# Patient Record
Sex: Male | Born: 1950 | Race: White | Hispanic: No | Marital: Married | Smoking: Current every day smoker
Health system: Southern US, Community
[De-identification: ages and names within clinical notes are randomized; demographics above are authoritative.]

## PROBLEM LIST (undated history)

## (undated) DIAGNOSIS — I236 Thrombosis of atrium, auricular appendage, and ventricle as current complications following acute myocardial infarction: Secondary | ICD-10-CM

## (undated) DIAGNOSIS — I219 Acute myocardial infarction, unspecified: Secondary | ICD-10-CM

## (undated) DIAGNOSIS — Z95810 Presence of automatic (implantable) cardiac defibrillator: Secondary | ICD-10-CM

## (undated) DIAGNOSIS — I255 Ischemic cardiomyopathy: Secondary | ICD-10-CM

## (undated) DIAGNOSIS — I2510 Atherosclerotic heart disease of native coronary artery without angina pectoris: Secondary | ICD-10-CM

## (undated) DIAGNOSIS — I4901 Ventricular fibrillation: Secondary | ICD-10-CM

## (undated) DIAGNOSIS — I10 Essential (primary) hypertension: Secondary | ICD-10-CM

## (undated) HISTORY — DX: Thrombosis of atrium, auricular appendage, and ventricle as current complications following acute myocardial infarction: I23.6

## (undated) HISTORY — DX: Ventricular fibrillation: I49.01

## (undated) HISTORY — DX: Ischemic cardiomyopathy: I25.5

## (undated) HISTORY — DX: Atherosclerotic heart disease of native coronary artery without angina pectoris: I25.10

---

## 1997-01-13 DIAGNOSIS — I219 Acute myocardial infarction, unspecified: Secondary | ICD-10-CM

## 1997-01-13 HISTORY — PX: CORONARY ANGIOPLASTY: SHX604

## 1997-01-13 HISTORY — DX: Acute myocardial infarction, unspecified: I21.9

## 1997-04-13 ENCOUNTER — Encounter (HOSPITAL_COMMUNITY): Admission: RE | Admit: 1997-04-13 | Discharge: 1997-07-12 | Payer: Self-pay | Admitting: Cardiology

## 2015-04-01 DIAGNOSIS — J019 Acute sinusitis, unspecified: Secondary | ICD-10-CM | POA: Diagnosis not present

## 2015-04-01 DIAGNOSIS — J209 Acute bronchitis, unspecified: Secondary | ICD-10-CM | POA: Diagnosis not present

## 2015-04-01 DIAGNOSIS — I10 Essential (primary) hypertension: Secondary | ICD-10-CM | POA: Diagnosis not present

## 2015-04-16 DIAGNOSIS — I10 Essential (primary) hypertension: Secondary | ICD-10-CM | POA: Diagnosis not present

## 2015-05-17 DIAGNOSIS — I10 Essential (primary) hypertension: Secondary | ICD-10-CM | POA: Diagnosis not present

## 2015-05-17 DIAGNOSIS — I25119 Atherosclerotic heart disease of native coronary artery with unspecified angina pectoris: Secondary | ICD-10-CM | POA: Diagnosis not present

## 2015-06-15 DIAGNOSIS — R198 Other specified symptoms and signs involving the digestive system and abdomen: Secondary | ICD-10-CM | POA: Diagnosis not present

## 2015-06-15 DIAGNOSIS — I10 Essential (primary) hypertension: Secondary | ICD-10-CM | POA: Diagnosis not present

## 2015-06-15 DIAGNOSIS — D539 Nutritional anemia, unspecified: Secondary | ICD-10-CM | POA: Diagnosis not present

## 2015-06-15 DIAGNOSIS — R748 Abnormal levels of other serum enzymes: Secondary | ICD-10-CM | POA: Diagnosis not present

## 2015-06-15 DIAGNOSIS — I25119 Atherosclerotic heart disease of native coronary artery with unspecified angina pectoris: Secondary | ICD-10-CM | POA: Diagnosis not present

## 2015-12-14 DIAGNOSIS — R748 Abnormal levels of other serum enzymes: Secondary | ICD-10-CM | POA: Diagnosis not present

## 2015-12-14 DIAGNOSIS — I10 Essential (primary) hypertension: Secondary | ICD-10-CM | POA: Diagnosis not present

## 2015-12-14 DIAGNOSIS — I25119 Atherosclerotic heart disease of native coronary artery with unspecified angina pectoris: Secondary | ICD-10-CM | POA: Diagnosis not present

## 2016-01-02 ENCOUNTER — Other Ambulatory Visit: Payer: Self-pay | Admitting: Gastroenterology

## 2016-01-02 DIAGNOSIS — R945 Abnormal results of liver function studies: Principal | ICD-10-CM

## 2016-01-02 DIAGNOSIS — R7989 Other specified abnormal findings of blood chemistry: Secondary | ICD-10-CM

## 2016-01-02 DIAGNOSIS — R740 Nonspecific elevation of levels of transaminase and lactic acid dehydrogenase [LDH]: Secondary | ICD-10-CM | POA: Diagnosis not present

## 2016-01-02 DIAGNOSIS — F1010 Alcohol abuse, uncomplicated: Secondary | ICD-10-CM | POA: Diagnosis not present

## 2016-01-02 DIAGNOSIS — R195 Other fecal abnormalities: Secondary | ICD-10-CM | POA: Diagnosis not present

## 2016-01-09 ENCOUNTER — Ambulatory Visit
Admission: RE | Admit: 2016-01-09 | Discharge: 2016-01-09 | Disposition: A | Discharge status: Home / Self Care | Payer: PPO | Source: Ambulatory Visit | Attending: Gastroenterology | Admitting: Gastroenterology

## 2016-01-09 DIAGNOSIS — R945 Abnormal results of liver function studies: Principal | ICD-10-CM

## 2016-01-09 DIAGNOSIS — R7989 Other specified abnormal findings of blood chemistry: Secondary | ICD-10-CM

## 2016-01-22 DIAGNOSIS — K222 Esophageal obstruction: Secondary | ICD-10-CM | POA: Diagnosis not present

## 2016-01-22 DIAGNOSIS — K921 Melena: Secondary | ICD-10-CM | POA: Diagnosis not present

## 2016-01-22 DIAGNOSIS — R195 Other fecal abnormalities: Secondary | ICD-10-CM | POA: Diagnosis not present

## 2016-01-22 DIAGNOSIS — K766 Portal hypertension: Secondary | ICD-10-CM | POA: Diagnosis not present

## 2016-01-22 DIAGNOSIS — K317 Polyp of stomach and duodenum: Secondary | ICD-10-CM | POA: Diagnosis not present

## 2016-01-22 DIAGNOSIS — K228 Other specified diseases of esophagus: Secondary | ICD-10-CM | POA: Diagnosis not present

## 2016-01-22 DIAGNOSIS — K5730 Diverticulosis of large intestine without perforation or abscess without bleeding: Secondary | ICD-10-CM | POA: Diagnosis not present

## 2016-02-12 DIAGNOSIS — G44219 Episodic tension-type headache, not intractable: Secondary | ICD-10-CM | POA: Diagnosis not present

## 2016-02-12 DIAGNOSIS — H40033 Anatomical narrow angle, bilateral: Secondary | ICD-10-CM | POA: Diagnosis not present

## 2016-04-21 DIAGNOSIS — F1010 Alcohol abuse, uncomplicated: Secondary | ICD-10-CM | POA: Diagnosis not present

## 2016-04-21 DIAGNOSIS — K5900 Constipation, unspecified: Secondary | ICD-10-CM | POA: Diagnosis not present

## 2016-06-10 DIAGNOSIS — I10 Essential (primary) hypertension: Secondary | ICD-10-CM | POA: Diagnosis not present

## 2016-06-10 DIAGNOSIS — I25119 Atherosclerotic heart disease of native coronary artery with unspecified angina pectoris: Secondary | ICD-10-CM | POA: Diagnosis not present

## 2016-06-12 DIAGNOSIS — Z23 Encounter for immunization: Secondary | ICD-10-CM | POA: Diagnosis not present

## 2016-06-12 DIAGNOSIS — I25119 Atherosclerotic heart disease of native coronary artery with unspecified angina pectoris: Secondary | ICD-10-CM | POA: Diagnosis not present

## 2016-06-12 DIAGNOSIS — I10 Essential (primary) hypertension: Secondary | ICD-10-CM | POA: Diagnosis not present

## 2016-06-12 DIAGNOSIS — R748 Abnormal levels of other serum enzymes: Secondary | ICD-10-CM | POA: Diagnosis not present

## 2016-07-13 DIAGNOSIS — I4901 Ventricular fibrillation: Secondary | ICD-10-CM

## 2016-07-13 HISTORY — DX: Ventricular fibrillation: I49.01

## 2016-08-04 ENCOUNTER — Inpatient Hospital Stay (HOSPITAL_COMMUNITY): Payer: PPO | Admitting: Anesthesiology

## 2016-08-04 ENCOUNTER — Encounter (HOSPITAL_COMMUNITY): Payer: Self-pay | Admitting: Certified Registered Nurse Anesthetist

## 2016-08-04 ENCOUNTER — Encounter (HOSPITAL_COMMUNITY)
Admission: EM | Disposition: A | Discharge status: Home Health | Payer: Self-pay | Source: Home / Self Care | Attending: Family Medicine

## 2016-08-04 ENCOUNTER — Inpatient Hospital Stay (HOSPITAL_COMMUNITY)
Admission: EM | Admit: 2016-08-04 | Discharge: 2016-08-19 | DRG: 981 | Disposition: A | Discharge status: Home Health | Payer: PPO | Attending: Family Medicine | Admitting: Family Medicine

## 2016-08-04 ENCOUNTER — Inpatient Hospital Stay (HOSPITAL_COMMUNITY): Payer: PPO

## 2016-08-04 ENCOUNTER — Emergency Department (HOSPITAL_COMMUNITY): Payer: PPO

## 2016-08-04 DIAGNOSIS — I469 Cardiac arrest, cause unspecified: Secondary | ICD-10-CM | POA: Insufficient documentation

## 2016-08-04 DIAGNOSIS — S2242XA Multiple fractures of ribs, left side, initial encounter for closed fracture: Secondary | ICD-10-CM | POA: Diagnosis present

## 2016-08-04 DIAGNOSIS — R55 Syncope and collapse: Secondary | ICD-10-CM | POA: Diagnosis not present

## 2016-08-04 DIAGNOSIS — S299XXD Unspecified injury of thorax, subsequent encounter: Secondary | ICD-10-CM | POA: Diagnosis not present

## 2016-08-04 DIAGNOSIS — Z23 Encounter for immunization: Secondary | ICD-10-CM | POA: Diagnosis not present

## 2016-08-04 DIAGNOSIS — K255 Chronic or unspecified gastric ulcer with perforation: Secondary | ICD-10-CM | POA: Diagnosis present

## 2016-08-04 DIAGNOSIS — K668 Other specified disorders of peritoneum: Secondary | ICD-10-CM | POA: Diagnosis not present

## 2016-08-04 DIAGNOSIS — I252 Old myocardial infarction: Secondary | ICD-10-CM

## 2016-08-04 DIAGNOSIS — I472 Ventricular tachycardia: Secondary | ICD-10-CM | POA: Diagnosis not present

## 2016-08-04 DIAGNOSIS — R000 Tachycardia, unspecified: Secondary | ICD-10-CM | POA: Diagnosis not present

## 2016-08-04 DIAGNOSIS — E871 Hypo-osmolality and hyponatremia: Secondary | ICD-10-CM | POA: Diagnosis not present

## 2016-08-04 DIAGNOSIS — T45515A Adverse effect of anticoagulants, initial encounter: Secondary | ICD-10-CM | POA: Diagnosis not present

## 2016-08-04 DIAGNOSIS — W19XXXA Unspecified fall, initial encounter: Secondary | ICD-10-CM | POA: Diagnosis present

## 2016-08-04 DIAGNOSIS — J9611 Chronic respiratory failure with hypoxia: Secondary | ICD-10-CM | POA: Diagnosis not present

## 2016-08-04 DIAGNOSIS — I255 Ischemic cardiomyopathy: Secondary | ICD-10-CM | POA: Diagnosis not present

## 2016-08-04 DIAGNOSIS — T829XXA Unspecified complication of cardiac and vascular prosthetic device, implant and graft, initial encounter: Secondary | ICD-10-CM

## 2016-08-04 DIAGNOSIS — T148XXA Other injury of unspecified body region, initial encounter: Secondary | ICD-10-CM | POA: Diagnosis not present

## 2016-08-04 DIAGNOSIS — R404 Transient alteration of awareness: Secondary | ICD-10-CM | POA: Diagnosis not present

## 2016-08-04 DIAGNOSIS — I493 Ventricular premature depolarization: Secondary | ICD-10-CM | POA: Diagnosis not present

## 2016-08-04 DIAGNOSIS — J90 Pleural effusion, not elsewhere classified: Secondary | ICD-10-CM

## 2016-08-04 DIAGNOSIS — I10 Essential (primary) hypertension: Secondary | ICD-10-CM

## 2016-08-04 DIAGNOSIS — E876 Hypokalemia: Secondary | ICD-10-CM | POA: Diagnosis not present

## 2016-08-04 DIAGNOSIS — J9690 Respiratory failure, unspecified, unspecified whether with hypoxia or hypercapnia: Secondary | ICD-10-CM | POA: Diagnosis not present

## 2016-08-04 DIAGNOSIS — S20219A Contusion of unspecified front wall of thorax, initial encounter: Secondary | ICD-10-CM

## 2016-08-04 DIAGNOSIS — I5022 Chronic systolic (congestive) heart failure: Secondary | ICD-10-CM | POA: Diagnosis not present

## 2016-08-04 DIAGNOSIS — R0989 Other specified symptoms and signs involving the circulatory and respiratory systems: Secondary | ICD-10-CM | POA: Diagnosis not present

## 2016-08-04 DIAGNOSIS — D62 Acute posthemorrhagic anemia: Secondary | ICD-10-CM | POA: Diagnosis present

## 2016-08-04 DIAGNOSIS — I639 Cerebral infarction, unspecified: Secondary | ICD-10-CM | POA: Diagnosis not present

## 2016-08-04 DIAGNOSIS — F17210 Nicotine dependence, cigarettes, uncomplicated: Secondary | ICD-10-CM | POA: Diagnosis present

## 2016-08-04 DIAGNOSIS — Z7141 Alcohol abuse counseling and surveillance of alcoholic: Secondary | ICD-10-CM

## 2016-08-04 DIAGNOSIS — S2232XA Fracture of one rib, left side, initial encounter for closed fracture: Secondary | ICD-10-CM | POA: Diagnosis not present

## 2016-08-04 DIAGNOSIS — K3189 Other diseases of stomach and duodenum: Secondary | ICD-10-CM | POA: Diagnosis not present

## 2016-08-04 DIAGNOSIS — I2510 Atherosclerotic heart disease of native coronary artery without angina pectoris: Secondary | ICD-10-CM | POA: Diagnosis present

## 2016-08-04 DIAGNOSIS — G9340 Encephalopathy, unspecified: Secondary | ICD-10-CM | POA: Diagnosis not present

## 2016-08-04 DIAGNOSIS — K922 Gastrointestinal hemorrhage, unspecified: Secondary | ICD-10-CM | POA: Diagnosis not present

## 2016-08-04 DIAGNOSIS — Y848 Other medical procedures as the cause of abnormal reaction of the patient, or of later complication, without mention of misadventure at the time of the procedure: Secondary | ICD-10-CM | POA: Diagnosis present

## 2016-08-04 DIAGNOSIS — J439 Emphysema, unspecified: Secondary | ICD-10-CM | POA: Diagnosis not present

## 2016-08-04 DIAGNOSIS — Z452 Encounter for adjustment and management of vascular access device: Secondary | ICD-10-CM

## 2016-08-04 DIAGNOSIS — R410 Disorientation, unspecified: Secondary | ICD-10-CM | POA: Diagnosis present

## 2016-08-04 DIAGNOSIS — S20219D Contusion of unspecified front wall of thorax, subsequent encounter: Secondary | ICD-10-CM | POA: Diagnosis not present

## 2016-08-04 DIAGNOSIS — Z716 Tobacco abuse counseling: Secondary | ICD-10-CM

## 2016-08-04 DIAGNOSIS — S20212A Contusion of left front wall of thorax, initial encounter: Secondary | ICD-10-CM | POA: Diagnosis not present

## 2016-08-04 DIAGNOSIS — M79622 Pain in left upper arm: Secondary | ICD-10-CM | POA: Diagnosis not present

## 2016-08-04 DIAGNOSIS — M47812 Spondylosis without myelopathy or radiculopathy, cervical region: Secondary | ICD-10-CM | POA: Diagnosis not present

## 2016-08-04 DIAGNOSIS — S20212S Contusion of left front wall of thorax, sequela: Secondary | ICD-10-CM

## 2016-08-04 DIAGNOSIS — K7460 Unspecified cirrhosis of liver: Secondary | ICD-10-CM | POA: Diagnosis present

## 2016-08-04 DIAGNOSIS — J9600 Acute respiratory failure, unspecified whether with hypoxia or hypercapnia: Secondary | ICD-10-CM | POA: Diagnosis not present

## 2016-08-04 DIAGNOSIS — Z9889 Other specified postprocedural states: Secondary | ICD-10-CM

## 2016-08-04 DIAGNOSIS — R402432 Glasgow coma scale score 3-8, at arrival to emergency department: Secondary | ICD-10-CM | POA: Diagnosis present

## 2016-08-04 DIAGNOSIS — Z9911 Dependence on respirator [ventilator] status: Secondary | ICD-10-CM | POA: Diagnosis not present

## 2016-08-04 DIAGNOSIS — R918 Other nonspecific abnormal finding of lung field: Secondary | ICD-10-CM | POA: Diagnosis not present

## 2016-08-04 DIAGNOSIS — I97621 Postprocedural hematoma of a circulatory system organ or structure following other procedure: Secondary | ICD-10-CM | POA: Diagnosis not present

## 2016-08-04 DIAGNOSIS — M7981 Nontraumatic hematoma of soft tissue: Secondary | ICD-10-CM | POA: Diagnosis not present

## 2016-08-04 DIAGNOSIS — I513 Intracardiac thrombosis, not elsewhere classified: Secondary | ICD-10-CM | POA: Diagnosis present

## 2016-08-04 DIAGNOSIS — I4901 Ventricular fibrillation: Secondary | ICD-10-CM | POA: Diagnosis not present

## 2016-08-04 DIAGNOSIS — S22069A Unspecified fracture of T7-T8 vertebra, initial encounter for closed fracture: Secondary | ICD-10-CM | POA: Diagnosis present

## 2016-08-04 DIAGNOSIS — J9601 Acute respiratory failure with hypoxia: Secondary | ICD-10-CM | POA: Diagnosis not present

## 2016-08-04 DIAGNOSIS — Z8673 Personal history of transient ischemic attack (TIA), and cerebral infarction without residual deficits: Secondary | ICD-10-CM

## 2016-08-04 DIAGNOSIS — N400 Enlarged prostate without lower urinary tract symptoms: Secondary | ICD-10-CM | POA: Diagnosis present

## 2016-08-04 DIAGNOSIS — I952 Hypotension due to drugs: Secondary | ICD-10-CM | POA: Diagnosis not present

## 2016-08-04 DIAGNOSIS — I468 Cardiac arrest due to other underlying condition: Secondary | ICD-10-CM | POA: Diagnosis not present

## 2016-08-04 DIAGNOSIS — I5023 Acute on chronic systolic (congestive) heart failure: Secondary | ICD-10-CM | POA: Diagnosis not present

## 2016-08-04 DIAGNOSIS — K251 Acute gastric ulcer with perforation: Secondary | ICD-10-CM | POA: Diagnosis not present

## 2016-08-04 DIAGNOSIS — R58 Hemorrhage, not elsewhere classified: Secondary | ICD-10-CM | POA: Diagnosis not present

## 2016-08-04 DIAGNOSIS — L7632 Postprocedural hematoma of skin and subcutaneous tissue following other procedure: Secondary | ICD-10-CM | POA: Diagnosis not present

## 2016-08-04 DIAGNOSIS — R0682 Tachypnea, not elsewhere classified: Secondary | ICD-10-CM | POA: Diagnosis not present

## 2016-08-04 DIAGNOSIS — D72829 Elevated white blood cell count, unspecified: Secondary | ICD-10-CM | POA: Diagnosis not present

## 2016-08-04 DIAGNOSIS — E785 Hyperlipidemia, unspecified: Secondary | ICD-10-CM | POA: Diagnosis present

## 2016-08-04 DIAGNOSIS — I110 Hypertensive heart disease with heart failure: Secondary | ICD-10-CM | POA: Diagnosis not present

## 2016-08-04 DIAGNOSIS — I517 Cardiomegaly: Secondary | ICD-10-CM | POA: Diagnosis not present

## 2016-08-04 DIAGNOSIS — M7989 Other specified soft tissue disorders: Secondary | ICD-10-CM | POA: Diagnosis not present

## 2016-08-04 DIAGNOSIS — S299XXA Unspecified injury of thorax, initial encounter: Secondary | ICD-10-CM | POA: Diagnosis not present

## 2016-08-04 DIAGNOSIS — R609 Edema, unspecified: Secondary | ICD-10-CM

## 2016-08-04 DIAGNOSIS — S20212D Contusion of left front wall of thorax, subsequent encounter: Secondary | ICD-10-CM | POA: Diagnosis not present

## 2016-08-04 HISTORY — DX: Atherosclerotic heart disease of native coronary artery without angina pectoris: I25.10

## 2016-08-04 HISTORY — DX: Essential (primary) hypertension: I10

## 2016-08-04 HISTORY — PX: LAPAROTOMY: SHX154

## 2016-08-04 LAB — TROPONIN I: TROPONIN I: 1.01 ng/mL — AB (ref <0.03)

## 2016-08-04 LAB — I-STAT CHEM 8, ED
BUN: 6 mg/dL (ref 6–20)
CALCIUM ION: 1.05 mmol/L — AB (ref 1.15–1.40)
Chloride: 101 mmol/L (ref 101–111)
Creatinine, Ser: 0.7 mg/dL (ref 0.61–1.24)
Glucose, Bld: 151 mg/dL — ABNORMAL HIGH (ref 65–99)
HCT: 50 % (ref 39.0–52.0)
Hemoglobin: 17 g/dL (ref 13.0–17.0)
Potassium: 3.3 mmol/L — ABNORMAL LOW (ref 3.5–5.1)
SODIUM: 142 mmol/L (ref 135–145)
TCO2: 23 mmol/L (ref 0–100)

## 2016-08-04 LAB — CBC WITH DIFFERENTIAL/PLATELET
BASOS PCT: 0 %
Basophils Absolute: 0 10*3/uL (ref 0.0–0.1)
EOS ABS: 0.2 10*3/uL (ref 0.0–0.7)
Eosinophils Relative: 1 %
HCT: 45.1 % (ref 39.0–52.0)
Hemoglobin: 15.7 g/dL (ref 13.0–17.0)
LYMPHS ABS: 6.2 10*3/uL — AB (ref 0.7–4.0)
Lymphocytes Relative: 35 %
MCH: 35 pg — AB (ref 26.0–34.0)
MCHC: 34.8 g/dL (ref 30.0–36.0)
MCV: 100.7 fL — AB (ref 78.0–100.0)
MONO ABS: 1.4 10*3/uL — AB (ref 0.1–1.0)
Monocytes Relative: 8 %
NEUTROS PCT: 56 %
Neutro Abs: 9.8 10*3/uL — ABNORMAL HIGH (ref 1.7–7.7)
PLATELETS: 378 10*3/uL (ref 150–400)
RBC: 4.48 MIL/uL (ref 4.22–5.81)
RDW: 13.7 % (ref 11.5–15.5)
WBC: 17.6 10*3/uL — ABNORMAL HIGH (ref 4.0–10.5)

## 2016-08-04 LAB — TRIGLYCERIDES: TRIGLYCERIDES: 88 mg/dL (ref <150)

## 2016-08-04 LAB — I-STAT CG4 LACTIC ACID, ED: Lactic Acid, Venous: 4.2 mmol/L (ref 0.5–1.9)

## 2016-08-04 LAB — CBG MONITORING, ED: GLUCOSE-CAPILLARY: 142 mg/dL — AB (ref 65–99)

## 2016-08-04 LAB — I-STAT ARTERIAL BLOOD GAS, ED
ACID-BASE EXCESS: 1 mmol/L (ref 0.0–2.0)
BICARBONATE: 27.2 mmol/L (ref 20.0–28.0)
O2 SAT: 100 %
PH ART: 7.37 (ref 7.350–7.450)
TCO2: 29 mmol/L (ref 0–100)
pCO2 arterial: 47.1 mmHg (ref 32.0–48.0)
pO2, Arterial: 512 mmHg — ABNORMAL HIGH (ref 83.0–108.0)

## 2016-08-04 LAB — MRSA PCR SCREENING: MRSA by PCR: NEGATIVE

## 2016-08-04 LAB — LACTIC ACID, PLASMA
LACTIC ACID, VENOUS: 1.6 mmol/L (ref 0.5–1.9)
Lactic Acid, Venous: 1.4 mmol/L (ref 0.5–1.9)

## 2016-08-04 LAB — ABO/RH: ABO/RH(D): A POS

## 2016-08-04 LAB — PROCALCITONIN: PROCALCITONIN: 0.25 ng/mL

## 2016-08-04 LAB — I-STAT TROPONIN, ED: Troponin i, poc: 0.07 ng/mL (ref 0.00–0.08)

## 2016-08-04 SURGERY — LAPAROTOMY, EXPLORATORY
Anesthesia: General | Site: Abdomen

## 2016-08-04 SURGERY — INVASIVE LAB ABORTED CASE

## 2016-08-04 MED ORDER — PIPERACILLIN-TAZOBACTAM 3.375 G IVPB 30 MIN
3.3750 g | INTRAVENOUS | Status: DC
  Filled 2016-08-04: qty 50

## 2016-08-04 MED ORDER — PANTOPRAZOLE SODIUM 40 MG IV SOLR
40.0000 mg | Freq: Every day | INTRAVENOUS | Status: DC
  Administered 2016-08-04: 40 mg via INTRAVENOUS
  Filled 2016-08-04: qty 40

## 2016-08-04 MED ORDER — PROPOFOL 10 MG/ML IV BOLUS
INTRAVENOUS | Status: AC
  Filled 2016-08-04: qty 20

## 2016-08-04 MED ORDER — FENTANYL CITRATE (PF) 100 MCG/2ML IJ SOLN
INTRAMUSCULAR | Status: DC | PRN
  Administered 2016-08-04 (×2): 100 ug via INTRAVENOUS
  Administered 2016-08-04: 50 ug via INTRAVENOUS
  Administered 2016-08-04: 250 ug via INTRAVENOUS

## 2016-08-04 MED ORDER — FOLIC ACID 5 MG/ML IJ SOLN
1.0000 mg | Freq: Every day | INTRAMUSCULAR | Status: DC
Start: 2016-08-04 — End: 2016-08-11
  Administered 2016-08-05 – 2016-08-09 (×4): 1 mg via INTRAVENOUS
  Filled 2016-08-04 (×11): qty 0.2

## 2016-08-04 MED ORDER — SODIUM CHLORIDE 0.9 % IV SOLN
200.0000 mg | Freq: Once | INTRAVENOUS | Status: AC
  Administered 2016-08-04: 200 mg via INTRAVENOUS
  Filled 2016-08-04: qty 200

## 2016-08-04 MED ORDER — SODIUM CHLORIDE 0.9 % IV SOLN
1.0000 g | Freq: Once | INTRAVENOUS | Status: AC
  Administered 2016-08-05: 1 g via INTRAVENOUS
  Filled 2016-08-04 (×2): qty 10

## 2016-08-04 MED ORDER — MIDAZOLAM HCL 5 MG/5ML IJ SOLN
INTRAMUSCULAR | Status: DC | PRN
  Administered 2016-08-04: 2 mg via INTRAVENOUS

## 2016-08-04 MED ORDER — SODIUM CHLORIDE 0.9 % IV SOLN: 250.0000 mL | INTRAVENOUS | Status: DC | PRN

## 2016-08-04 MED ORDER — ROCURONIUM BROMIDE 100 MG/10ML IV SOLN
INTRAVENOUS | Status: DC | PRN
  Administered 2016-08-04 (×2): 50 mg via INTRAVENOUS

## 2016-08-04 MED ORDER — PIPERACILLIN-TAZOBACTAM 3.375 G IVPB 30 MIN
3.3750 g | Freq: Once | INTRAVENOUS | Status: DC
  Filled 2016-08-04: qty 50

## 2016-08-04 MED ORDER — PROPOFOL 1000 MG/100ML IV EMUL
0.0000 ug/kg/min | INTRAVENOUS | Status: DC
  Administered 2016-08-04: 50 ug/kg/min via INTRAVENOUS
  Administered 2016-08-04: 40 ug/kg/min via INTRAVENOUS
  Administered 2016-08-05: 20 ug/kg/min via INTRAVENOUS
  Administered 2016-08-05 – 2016-08-06 (×2): 30 ug/kg/min via INTRAVENOUS
  Filled 2016-08-04 (×4): qty 100

## 2016-08-04 MED ORDER — FENTANYL CITRATE (PF) 100 MCG/2ML IJ SOLN
50.0000 ug | INTRAMUSCULAR | Status: DC | PRN
  Administered 2016-08-05 – 2016-08-07 (×5): 50 ug via INTRAVENOUS
  Filled 2016-08-04 (×2): qty 2

## 2016-08-04 MED ORDER — ORAL CARE MOUTH RINSE
15.0000 mL | OROMUCOSAL | Status: DC
  Administered 2016-08-04 – 2016-08-07 (×22): 15 mL via OROMUCOSAL

## 2016-08-04 MED ORDER — METOPROLOL TARTRATE 5 MG/5ML IV SOLN
5.0000 mg | Freq: Once | INTRAVENOUS | Status: AC
  Administered 2016-08-04: 5 mg via INTRAVENOUS

## 2016-08-04 MED ORDER — CHLORHEXIDINE GLUCONATE 0.12% ORAL RINSE (MEDLINE KIT)
15.0000 mL | Freq: Two times a day (BID) | OROMUCOSAL | Status: DC
  Administered 2016-08-04 – 2016-08-06 (×5): 15 mL via OROMUCOSAL

## 2016-08-04 MED ORDER — DEXTROSE 5 % IV SOLN
0.0000 ug/min | INTRAVENOUS | Status: DC
  Administered 2016-08-04: 5 ug/min via INTRAVENOUS
  Filled 2016-08-04: qty 4

## 2016-08-04 MED ORDER — ETOMIDATE 2 MG/ML IV SOLN
INTRAVENOUS | Status: DC | PRN
  Administered 2016-08-04: 20 mg via INTRAVENOUS

## 2016-08-04 MED ORDER — PROPOFOL 1000 MG/100ML IV EMUL
INTRAVENOUS | Status: AC
  Administered 2016-08-04: 5 ug/kg/min
  Filled 2016-08-04: qty 100

## 2016-08-04 MED ORDER — THIAMINE HCL 100 MG/ML IJ SOLN
100.0000 mg | Freq: Every day | INTRAMUSCULAR | Status: DC
  Administered 2016-08-05 – 2016-08-10 (×6): 100 mg via INTRAVENOUS
  Filled 2016-08-04 (×6): qty 2

## 2016-08-04 MED ORDER — 0.9 % SODIUM CHLORIDE (POUR BTL) OPTIME
TOPICAL | Status: DC | PRN
  Administered 2016-08-04 (×3): 1000 mL

## 2016-08-04 MED ORDER — HEPARIN SODIUM (PORCINE) 5000 UNIT/ML IJ SOLN
5000.0000 [IU] | Freq: Three times a day (TID) | INTRAMUSCULAR | Status: DC
  Administered 2016-08-04 – 2016-08-06 (×6): 5000 [IU] via SUBCUTANEOUS
  Filled 2016-08-04 (×6): qty 1

## 2016-08-04 MED ORDER — FENTANYL CITRATE (PF) 250 MCG/5ML IJ SOLN
INTRAMUSCULAR | Status: AC
  Filled 2016-08-04: qty 5

## 2016-08-04 MED ORDER — ROCURONIUM BROMIDE 50 MG/5ML IV SOLN
INTRAVENOUS | Status: DC | PRN
  Administered 2016-08-04: 80 mg via INTRAVENOUS

## 2016-08-04 MED ORDER — FENTANYL CITRATE (PF) 100 MCG/2ML IJ SOLN
50.0000 ug | INTRAMUSCULAR | Status: DC | PRN
  Filled 2016-08-04 (×4): qty 2

## 2016-08-04 MED ORDER — ALBUMIN HUMAN 5 % IV SOLN
INTRAVENOUS | Status: DC | PRN
  Administered 2016-08-04: 18:00:00 via INTRAVENOUS

## 2016-08-04 MED ORDER — LACTATED RINGERS IV SOLN
INTRAVENOUS | Status: DC | PRN
  Administered 2016-08-04: 17:00:00 via INTRAVENOUS

## 2016-08-04 MED ORDER — SODIUM CHLORIDE 0.9 % IV SOLN
100.0000 mg | INTRAVENOUS | Status: DC
  Administered 2016-08-05: 100 mg via INTRAVENOUS
  Filled 2016-08-04 (×2): qty 100

## 2016-08-04 MED ORDER — IOPAMIDOL (ISOVUE-300) INJECTION 61%
INTRAVENOUS | Status: AC
  Filled 2016-08-04: qty 30

## 2016-08-04 MED ORDER — PIPERACILLIN-TAZOBACTAM 3.375 G IVPB
3.3750 g | Freq: Three times a day (TID) | INTRAVENOUS | Status: DC
  Administered 2016-08-04 – 2016-08-06 (×6): 3.375 g via INTRAVENOUS
  Filled 2016-08-04 (×7): qty 50

## 2016-08-04 MED ORDER — MIDAZOLAM HCL 2 MG/2ML IJ SOLN
INTRAMUSCULAR | Status: AC
  Filled 2016-08-04: qty 2

## 2016-08-04 MED ORDER — POTASSIUM CHLORIDE 20 MEQ/15ML (10%) PO SOLN
40.0000 meq | Freq: Once | ORAL | Status: AC
  Administered 2016-08-04: 40 meq
  Filled 2016-08-04: qty 30

## 2016-08-04 MED ORDER — SODIUM CHLORIDE 0.9 % IV SOLN
INTRAVENOUS | Status: DC
  Administered 2016-08-04 – 2016-08-06 (×3): via INTRAVENOUS

## 2016-08-04 SURGICAL SUPPLY — 44 items
BLADE CLIPPER SURG (BLADE) IMPLANT
BNDG GAUZE ELAST 4 BULKY (GAUZE/BANDAGES/DRESSINGS) ×2 IMPLANT
CANISTER SUCT 3000ML PPV (MISCELLANEOUS) ×3 IMPLANT
CHLORAPREP W/TINT 26ML (MISCELLANEOUS) ×3 IMPLANT
COVER SURGICAL LIGHT HANDLE (MISCELLANEOUS) ×3 IMPLANT
DRAIN CHANNEL 19F RND (DRAIN) ×2 IMPLANT
DRAPE LAPAROSCOPIC ABDOMINAL (DRAPES) ×3 IMPLANT
DRAPE WARM FLUID 44X44 (DRAPE) ×3 IMPLANT
DRSG OPSITE POSTOP 4X10 (GAUZE/BANDAGES/DRESSINGS) IMPLANT
DRSG OPSITE POSTOP 4X8 (GAUZE/BANDAGES/DRESSINGS) IMPLANT
DRSG PAD ABDOMINAL 8X10 ST (GAUZE/BANDAGES/DRESSINGS) ×4 IMPLANT
ELECT BLADE 6.5 EXT (BLADE) IMPLANT
ELECT CAUTERY BLADE 6.4 (BLADE) ×3 IMPLANT
ELECT REM PT RETURN 9FT ADLT (ELECTROSURGICAL) ×3
ELECTRODE REM PT RTRN 9FT ADLT (ELECTROSURGICAL) ×1 IMPLANT
EVACUATOR SILICONE 100CC (DRAIN) ×2 IMPLANT
GAUZE SPONGE 4X4 12PLY STRL (GAUZE/BANDAGES/DRESSINGS) ×2 IMPLANT
GLOVE BIO SURGEON STRL SZ 6 (GLOVE) ×3 IMPLANT
GLOVE BIO SURGEON STRL SZ8 (GLOVE) ×2 IMPLANT
GLOVE BIOGEL PI IND STRL 6.5 (GLOVE) ×1 IMPLANT
GLOVE BIOGEL PI IND STRL 8 (GLOVE) IMPLANT
GLOVE BIOGEL PI INDICATOR 6.5 (GLOVE) ×2
GLOVE BIOGEL PI INDICATOR 8 (GLOVE) ×2
GOWN STRL REUS W/ TWL LRG LVL3 (GOWN DISPOSABLE) ×2 IMPLANT
GOWN STRL REUS W/TWL LRG LVL3 (GOWN DISPOSABLE) ×6
KIT BASIN OR (CUSTOM PROCEDURE TRAY) ×3 IMPLANT
KIT ROOM TURNOVER OR (KITS) ×3 IMPLANT
LIGASURE IMPACT 36 18CM CVD LR (INSTRUMENTS) IMPLANT
NS IRRIG 1000ML POUR BTL (IV SOLUTION) ×6 IMPLANT
PACK GENERAL/GYN (CUSTOM PROCEDURE TRAY) ×3 IMPLANT
PAD ARMBOARD 7.5X6 YLW CONV (MISCELLANEOUS) ×3 IMPLANT
SPECIMEN JAR LARGE (MISCELLANEOUS) IMPLANT
SPONGE LAP 18X18 X RAY DECT (DISPOSABLE) IMPLANT
STAPLER VISISTAT 35W (STAPLE) ×3 IMPLANT
SUCTION POOLE TIP (SUCTIONS) ×3 IMPLANT
SUT ETHILON 2 0 FS 18 (SUTURE) ×2 IMPLANT
SUT PDS AB 1 TP1 96 (SUTURE) ×6 IMPLANT
SUT SILK 2 0 SH CR/8 (SUTURE) ×5 IMPLANT
SUT SILK 2 0 TIES 10X30 (SUTURE) ×3 IMPLANT
SUT SILK 3 0 SH CR/8 (SUTURE) ×3 IMPLANT
SUT SILK 3 0 TIES 10X30 (SUTURE) ×3 IMPLANT
SUT VIC AB 3-0 SH 18 (SUTURE) IMPLANT
TOWEL OR 17X26 10 PK STRL BLUE (TOWEL DISPOSABLE) ×3 IMPLANT
TRAY FOLEY W/METER SILVER 16FR (SET/KITS/TRAYS/PACK) IMPLANT

## 2016-08-04 SURGICAL SUPPLY — 5 items
KIT HEART LEFT (KITS) ×1 IMPLANT
PACK CARDIAC CATHETERIZATION (CUSTOM PROCEDURE TRAY) ×1 IMPLANT
SYR MEDRAD MARK V 150ML (SYRINGE) ×1 IMPLANT
TRANSDUCER W/STOPCOCK (MISCELLANEOUS) ×1 IMPLANT
TUBING CIL FLEX 10 FLL-RA (TUBING) ×1 IMPLANT

## 2016-08-04 NOTE — ED Provider Notes (Signed)
[facility] DEPT Provider Note   CSN: [id number] Arrival date & time: 08/04/16  1542     History   Chief Complaint Chief Complaint  Patient presents with  . Loss of Consciousness    HPI [name] [name] Geisinger is a 66 y.o. male.  The history is provided by the EMS personnel.  Illness  This is a new problem. The current episode started 1 to 2 hours ago. The problem occurs constantly. The problem has not changed since onset.Pertinent negatives include no chest pain, no abdominal pain, no headaches and no shortness of breath. Associated symptoms comments: Loss of consciousness, abdominal distention. Nothing aggravates the symptoms. Nothing relieves the symptoms. He has tried nothing for the symptoms.    History reviewed. No pertinent past medical history.  Patient Active Problem List   Diagnosis Date Noted  . Pneumoperitoneum 08/04/2016  . Cardiac arrest ([place])   . Syncope and collapse     History reviewed. No pertinent surgical history.     Home Medications    Prior to Admission medications   Not on File    Family History History reviewed. No pertinent family history.  Social History Social History  Substance Use Topics  . Smoking status: Not on file  . Smokeless tobacco: Not on file  . Alcohol use Not on file     Allergies   Patient has no allergy information on record.   Review of Systems Review of Systems  Unable to perform ROS: Intubated  Respiratory: Negative for shortness of breath.   Cardiovascular: Negative for chest pain.  Gastrointestinal: Negative for abdominal pain.  Neurological: Negative for headaches.     Physical Exam Updated Vital Signs BP 91/68   Pulse 88   Temp 98.3 F (36.8 C) (Oral)   Resp 18   Ht 5' 8" (1.727 m)   Wt 75 kg (165 lb 5.5 oz)   SpO2 99%   BMI 25.14 kg/m   Physical Exam  Constitutional: He appears well-developed and well-nourished.  HENT:  Head: Normocephalic.  Eyes: Pupils are equal, round, and  reactive to light. Conjunctivae are normal.  Neck: No tracheal deviation present.  C-collar in place  Cardiovascular: Normal rate and intact distal pulses.   Pulmonary/Chest:  Ventilated breath sounds, equal bilaterally  Abdominal: Soft. He exhibits distension.  Marked distention of abdomen  Musculoskeletal: He exhibits no deformity.  Neurological:  GCS 3T  Skin: Skin is warm and dry.  Psychiatric:  Unable to assess     ED Treatments / Results  Labs (all labs ordered are listed, but only abnormal results are displayed) Labs Reviewed  CBC WITH DIFFERENTIAL/PLATELET - Abnormal; Notable for the following:       Result Value   WBC 17.6 (*)    MCV 100.7 (*)    MCH 35.0 (*)    Neutro Abs 9.8 (*)    Lymphs Abs 6.2 (*)    Monocytes Absolute 1.4 (*)    All other components within normal limits  TROPONIN I - Abnormal; Notable for the following:    Troponin I 1.01 (*)    All other components within normal limits  CBG MONITORING, ED - Abnormal; Notable for the following:    Glucose-Capillary 142 (*)    All other components within normal limits  I-STAT CHEM 8, ED - Abnormal; Notable for the following:    Potassium 3.3 (*)    Glucose, Bld 151 (*)    Calcium, Ion 1.05 (*)    All other components within normal  limits  I-STAT ARTERIAL BLOOD GAS, ED - Abnormal; Notable for the following:    pO2, Arterial 512.0 (*)    All other components within normal limits  I-STAT CG4 LACTIC ACID, ED - Abnormal; Notable for the following:    Lactic Acid, Venous 4.20 (*)    All other components within normal limits  MRSA PCR SCREENING  CULTURE, BLOOD (ROUTINE X 2)  CULTURE, BLOOD (ROUTINE X 2)  TRIGLYCERIDES  LACTIC ACID, PLASMA  LACTIC ACID, PLASMA  PROCALCITONIN  TROPONIN I  CBC  BASIC METABOLIC PANEL  BLOOD GAS, ARTERIAL  MAGNESIUM  PHOSPHORUS  PROCALCITONIN  BLOOD GAS, ARTERIAL  BASIC METABOLIC PANEL  MAGNESIUM  I-STAT TROPONIN, ED  TYPE AND SCREEN  ABO/RH  SURGICAL PATHOLOGY     EKG  EKG Interpretation None       Radiology Ct Abdomen Pelvis Wo Contrast  Addendum Date: 08/04/2016   ADDENDUM REPORT: 08/04/2016 17:57 ADDENDUM: Imaging findings and results were discussed with Dr. Georganna Skeans upon completion of the study by Dr. Laurence Ferrari. Electronically Signed   By: Donavan Foil M.D.   On: 08/04/2016 17:57   Result Date: 08/04/2016 CLINICAL DATA:  Collapsed in [employer], post CPR EXAM: CT CHEST, ABDOMEN AND PELVIS WITHOUT CONTRAST TECHNIQUE: Multidetector CT imaging of the chest, abdomen and pelvis was performed following the standard protocol without IV contrast. COMPARISON:  Radiograph 08/04/2016 FINDINGS: CT CHEST FINDINGS Cardiovascular: Limited evaluation without intravenous contrast. Aortic atherosclerosis. Coronary artery calcification. 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 Small amount of pericardial effusion. Mild cardiomegaly. Mediastinum/Nodes: Endotracheal tube tip terminates several cm above the carina. No thyroid mass. Esophageal tube in the esophagus with the tip terminating near the gastric outlet. Nonspecific subcentimeter mediastinal lymph nodes. Lungs/Pleura: No pneumothorax. Mild emphysema. Mild bronchiectasis within the right greater than left upper lobes. No focal consolidation or pleural effusion. Musculoskeletal: Motion artifact causes false appearance of inferior sternal fracture. Irregular linear lucency through the anterior aspect of T8 vertebral body, without posterior extension, this may extend to the inferior endplate at T8. Degenerative changes. CT ABDOMEN PELVIS FINDINGS Hepatobiliary: No focal liver abnormality is seen. No gallstones, gallbladder wall thickening, or biliary dilatation. Pancreas: Unremarkable. No pancreatic ductal dilatation or surrounding inflammatory changes. Spleen: Normal in size without focal abnormality. Adrenals/Urinary Tract: Adrenal glands are unremarkable. Kidneys are  normal, without renal calculi, focal lesion, or hydronephrosis. Bladder is unremarkable. Stomach/Bowel: Esophageal tube tip in the stomach. Contrast material present within the stomach and proximal small bowel. Small focal defect within the anterior aspect of the stomach/lesser curvature, series 3, image number 56 with leakage of contrast around this defect. No significant bowel wall thickening. Vascular/Lymphatic: Aortic atherosclerosis. Non aneurysmal aorta. Scattered subcentimeter mesenteric lymph nodes. Reproductive: Prominent prostate gland. Other: Massive pneumoperitoneum. Free intraperitoneal contrast within the left abdomen. Musculoskeletal: Degenerative changes. No acute or suspicious bone lesion. IMPRESSION: 1. Massive pneumoperitoneum with free extravasation of contrast in the left hemiabdomen, consistent with viscus perforation. Apparent source is a defect within the anterior aspect/lesser curvature of the stomach. 2. Linear lucency within the T8 vertebral body is suspicious for nondisplaced fracture, likely acute. 3. Mild emphysema Critical Value/emergent results were discussed with the surgeon caring for the patient by Dr. Laurence Ferrari at the time the scan was completed. A call was placed to the emergency department at 5:46 p.m., patient is reportedly in the OR. Electronically Signed: By: Donavan Foil M.D. On: 08/04/2016 17:47   Ct Head Wo Contrast  Result Date: 08/04/2016 CLINICAL DATA:  66 year old male status post cardiac arrest and collapse at [employer] earlier today. Patient is status post CPR. EXAM: CT HEAD WITHOUT CONTRAST CT CERVICAL SPINE WITHOUT CONTRAST TECHNIQUE: Multidetector CT imaging of the head and cervical spine was performed following the standard protocol without intravenous contrast. Multiplanar CT image reconstructions of the cervical spine were also generated. COMPARISON:  None. FINDINGS: CT HEAD FINDINGS Brain: Negative for acute intracranial hemorrhage, acute infarction,  mass, mass effect, hydrocephalus or midline shift. Gray-white differentiation is preserved throughout. Mild cerebral cortical atrophy. Small bilateral remote cerebellar lacunar infarcts. Vascular: No hyperdense vessel or unexpected calcification. Skull: Normal. Negative for fracture or focal lesion. Sinuses/Orbits: No acute finding. Other: None. CT CERVICAL SPINE FINDINGS Alignment: Normal. Skull base and vertebrae: No acute cervical spine fracture. There is a nondisplaced fracture through the posterior aspect of the left first rib. No primary bone lesion or focal pathologic process. Soft tissues and spinal canal: No prevertebral fluid or swelling. No visible canal hematoma. Disc levels: Multilevel cervical spondylosis. Degenerative changes are most notable at C4-C5, C5-C6 and C6-C7. Right-sided facet arthropathy at C3-C4. Left-sided facet arthropathy at C2-C3, C3-C4, C4-C5 and C7-T1. Upper chest: Negative. Other: The patient is intubated. A gastric tube is also present in the esophagus. IMPRESSION: CT HEAD 1. No acute intracranial abnormality. 2. Cerebral cortical atrophy. 3. Remote right cerebellar lacunar infarct. CT CSPINE 1. No evidence of acute cervical fracture or malalignment. 2. Nondisplaced left first rib fracture. 3. Multilevel cervical spondylosis and left greater than right facet arthropathy. Electronically Signed   By: Jacqulynn Cadet M.D.   On: 08/04/2016 17:33   Ct Chest Wo Contrast  Addendum Date: 08/04/2016   ADDENDUM REPORT: 08/04/2016 17:57 ADDENDUM: Imaging findings and results were discussed with Dr. Georganna Skeans upon completion of the study by Dr. Laurence Ferrari. Electronically Signed   By: Donavan Foil M.D.   On: 08/04/2016 17:57   Result Date: 08/04/2016 CLINICAL DATA:  Collapsed in [employer], post CPR EXAM: CT CHEST, ABDOMEN AND PELVIS WITHOUT CONTRAST TECHNIQUE: Multidetector CT imaging of the chest, abdomen and pelvis was performed following the standard protocol without IV  contrast. COMPARISON:  Radiograph 08/04/2016 FINDINGS: CT CHEST FINDINGS Cardiovascular: Limited evaluation without intravenous contrast. Aortic atherosclerosis. Coronary artery calcification. 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 Small amount of pericardial effusion. Mild cardiomegaly. Mediastinum/Nodes: Endotracheal tube tip terminates several cm above the carina. No thyroid mass. Esophageal tube in the esophagus with the tip terminating near the gastric outlet. Nonspecific subcentimeter mediastinal lymph nodes. Lungs/Pleura: No pneumothorax. Mild emphysema. Mild bronchiectasis within the right greater than left upper lobes. No focal consolidation or pleural effusion. Musculoskeletal: Motion artifact causes false appearance of inferior sternal fracture. Irregular linear lucency through the anterior aspect of T8 vertebral body, without posterior extension, this may extend to the inferior endplate at T8. Degenerative changes. CT ABDOMEN PELVIS FINDINGS Hepatobiliary: No focal liver abnormality is seen. No gallstones, gallbladder wall thickening, or biliary dilatation. Pancreas: Unremarkable. No pancreatic ductal dilatation or surrounding inflammatory changes. Spleen: Normal in size without focal abnormality. Adrenals/Urinary Tract: Adrenal glands are unremarkable. Kidneys are normal, without renal calculi, focal lesion, or hydronephrosis. Bladder is unremarkable. Stomach/Bowel: Esophageal tube tip in the stomach. Contrast material present within the stomach and proximal small bowel. Small focal defect within the anterior aspect of the stomach/lesser curvature, series 3, image number 56 with leakage of contrast around this defect. No significant bowel wall thickening. Vascular/Lymphatic: Aortic atherosclerosis. Non aneurysmal aorta. Scattered subcentimeter mesenteric lymph nodes. Reproductive: Prominent  prostate gland. Other: Massive pneumoperitoneum. Free intraperitoneal  contrast within the left abdomen. Musculoskeletal: Degenerative changes. No acute or suspicious bone lesion. IMPRESSION: 1. Massive pneumoperitoneum with free extravasation of contrast in the left hemiabdomen, consistent with viscus perforation. Apparent source is a defect within the anterior aspect/lesser curvature of the stomach. 2. Linear lucency within the T8 vertebral body is suspicious for nondisplaced fracture, likely acute. 3. Mild emphysema Critical Value/emergent results were discussed with the surgeon caring for the patient by Dr. Laurence Ferrari at the time the scan was completed. A call was placed to the emergency department at 5:46 p.m., patient is reportedly in the OR. Electronically Signed: By: Donavan Foil M.D. On: 08/04/2016 17:47   Ct Cervical Spine Wo Contrast  Result Date: 08/04/2016 CLINICAL DATA:  66 year old male status post cardiac arrest and collapse at [employer] earlier today. Patient is status post CPR. EXAM: CT HEAD WITHOUT CONTRAST CT CERVICAL SPINE WITHOUT CONTRAST TECHNIQUE: Multidetector CT imaging of the head and cervical spine was performed following the standard protocol without intravenous contrast. Multiplanar CT image reconstructions of the cervical spine were also generated. COMPARISON:  None. FINDINGS: CT HEAD FINDINGS Brain: Negative for acute intracranial hemorrhage, acute infarction, mass, mass effect, hydrocephalus or midline shift. Gray-white differentiation is preserved throughout. Mild cerebral cortical atrophy. Small bilateral remote cerebellar lacunar infarcts. Vascular: No hyperdense vessel or unexpected calcification. Skull: Normal. Negative for fracture or focal lesion. Sinuses/Orbits: No acute finding. Other: None. CT CERVICAL SPINE FINDINGS Alignment: Normal. Skull base and vertebrae: No acute cervical spine fracture. There is a nondisplaced fracture through the posterior aspect of the left first rib. No primary bone lesion or focal pathologic process. Soft  tissues and spinal canal: No prevertebral fluid or swelling. No visible canal hematoma. Disc levels: Multilevel cervical spondylosis. Degenerative changes are most notable at C4-C5, C5-C6 and C6-C7. Right-sided facet arthropathy at C3-C4. Left-sided facet arthropathy at C2-C3, C3-C4, C4-C5 and C7-T1. Upper chest: Negative. Other: The patient is intubated. A gastric tube is also present in the esophagus. IMPRESSION: CT HEAD 1. No acute intracranial abnormality. 2. Cerebral cortical atrophy. 3. Remote right cerebellar lacunar infarct. CT CSPINE 1. No evidence of acute cervical fracture or malalignment. 2. Nondisplaced left first rib fracture. 3. Multilevel cervical spondylosis and left greater than right facet arthropathy. Electronically Signed   By: Jacqulynn Cadet M.D.   On: 08/04/2016 17:33   Dg Chest Portable 1 View  Result Date: 08/04/2016 CLINICAL DATA:  Cardiorespiratory arrest. Cardiopulmonary resuscitation. EXAM: PORTABLE CHEST 1 VIEW COMPARISON:  None. FINDINGS: Massive pneumoperitoneum. Endotracheal tube tip in satisfactory position approximately 5 cm above the carina. Nasogastric tube courses below the diaphragm into the stomach. Cardiac silhouette moderately enlarged. Lungs clear. Bronchovascular markings normal. Pulmonary vascularity normal. No visible pleural effusions. No pneumothorax. IMPRESSION: 1. Massive pneumoperitoneum. 2.  Support apparatus satisfactory. 3. Moderate cardiomegaly.  No acute cardiopulmonary disease. I personally telephoned these emergent results at the time of interpretation on 08/04/2016 at 4:15 pm to Dr. Orlie Dakin, who verbally acknowledged these results. Electronically Signed   By: Evangeline Dakin M.D.   On: 08/04/2016 16:18    Procedures Procedure Name: Intubation Date/Time: 08/05/2016 12:24 AM Performed by: Valda Lamb Pre-anesthesia Checklist: Patient identified, Emergency Drugs available, Suction available, Patient being monitored and Timeout  performed Oxygen Delivery Method: Non-rebreather mask Preoxygenation: Pre-oxygenation with 100% oxygen Induction Type: Rapid sequence and Cricoid Pressure applied Ventilation: Mask ventilation without difficulty Laryngoscope Size: Glidescope and 4 Grade View: Grade I Tube size: 7.5 mm Number of attempts:  1 Airway Equipment and Method: Video-laryngoscopy and Rigid stylet Placement Confirmation: ETT inserted through vocal cords under direct vision,  Positive ETCO2 and Breath sounds checked- equal and bilateral Secured at: 24 cm Tube secured with: ETT holder      (including critical care time)  Medications Ordered in ED Medications  etomidate (AMIDATE) injection (20 mg Intravenous Given 08/04/16 1549)  heparin injection 5,000 Units (5,000 Units Subcutaneous Given 08/04/16 2143)  0.9 %  sodium chloride infusion ( Intravenous Rate/Dose Verify 08/04/16 2300)  pantoprazole (PROTONIX) injection 40 mg (40 mg Intravenous Given 08/04/16 2136)  0.9 %  sodium chloride infusion ( Intravenous MAR Unhold 08/04/16 1848)  fentaNYL (SUBLIMAZE) injection 50 mcg ( Intravenous MAR Unhold 08/04/16 1848)  fentaNYL (SUBLIMAZE) injection 50 mcg ( Intravenous MAR Unhold 08/04/16 1848)  propofol (DIPRIVAN) 1000 MG/100ML infusion (0 mcg/kg/min  75 kg Intravenous Stopped 08/04/16 2238)  calcium gluconate 1 g in sodium chloride 0.9 % 100 mL IVPB ( Intravenous MAR Unhold 08/04/16 1848)  piperacillin-tazobactam (ZOSYN) IVPB 3.375 g ( Intravenous MAR Unhold 08/04/16 1848)  anidulafungin (ERAXIS) 200 mg in sodium chloride 0.9 % 200 mL IVPB (200 mg Intravenous New Bag/Given 08/04/16 2232)  anidulafungin (ERAXIS) 100 mg in sodium chloride 0.9 % 100 mL IVPB ( Intravenous MAR Unhold 08/04/16 1848)  iopamidol (ISOVUE-300) 61 % injection (not administered)  thiamine (B-1) injection 100 mg ( Intravenous MAR Unhold 5/39/76 7341)  folic acid injection 1 mg ( Intravenous MAR Unhold 08/04/16 1848)  piperacillin-tazobactam (ZOSYN) IVPB  3.375 g ( Intravenous MAR Unhold 08/04/16 1848)  chlorhexidine gluconate (MEDLINE KIT) (PERIDEX) 0.12 % solution 15 mL (15 mLs Mouth Rinse Given 08/04/16 1923)  MEDLINE mouth rinse (15 mLs Mouth Rinse Given 08/04/16 2139)  norepinephrine (LEVOPHED) 4 mg in dextrose 5 % 250 mL (0.016 mg/mL) infusion (5.013 mcg/min Intravenous Rate/Dose Verify 08/04/16 2300)  propofol (DIPRIVAN) 1000 MG/100ML infusion (5 mcg/kg/min  New Bag/Given 08/04/16 1618)  metoprolol tartrate (LOPRESSOR) injection 5 mg (5 mg Intravenous Given 08/04/16 1559)  potassium chloride 20 MEQ/15ML (10%) solution 40 mEq (40 mEq Per Tube Given 08/04/16 2221)     Initial Impression / Assessment and Plan / ED Course  I have reviewed the triage vital signs and the nursing notes.  Pertinent labs & imaging results that were available during my care of the patient were reviewed by me and considered in my medical decision making (see chart for details).     Patient presented with concern for cardiac arrest while in [place]. Apparently patient had a loss of consciousness And fell to the ground. Unknown amount of down time however it is estimated 3-4 minutes of CPR was administered. Bystanders placed AED and he received one shock, and then the AED machine advised no more shocks. By the time EMS arrived he had a pulse and EKG transmitted to [place] did appear to have ST elevation in the anterior leads. Patient withdrawing from pain only. Place on 15 L nonrebreather and transferred here. Upon arrival patient with agonal respirations with a GCS of 3. Intubated for airway protection. Cardiology was present upon the patient's arrival. Physical exam unremarkable for markedly distended and tympanitic abdomen.  Bedside x-ray for post intubation did show pneumoperitoneum. Surgery was consulted in the emergency department and Dr. Kae Heller evaluated him in the ED. The intensivist was also consulted in the emergency department.   CT imaging ordered and showed massive  pneumoperitoneum with free extravasation of contrast in the left hemiabdomen consistent with viscus perforation. Defect likely in the stomach;  also showed possible T8 vertebral body fracture. Patient was started on antibiotics IV intensivist and will be admitted to the ICU for further care, and will likely also go to the operating room. Patient in critical condition while in the emergency department.  Patient was seen with my attending, Dr. Winfred Leeds, who voiced agreement and oversaw the evaluation and treatment of this patient.   [employer] seismologist was used in the creation of this note. If there are any errors or inconsistencies needing clarification, please contact me directly.   Final Clinical Impressions(s) / ED Diagnoses   Final diagnoses:  Cardiac arrest ([place])  Syncope and collapse    New Prescriptions There are no discharge medications for this patient.    Valda Lamb, MD 08/05/16 8127    Orlie Dakin, MD 08/06/16 [phone]

## 2016-08-04 NOTE — H&P (Signed)
PULMONARY / CRITICAL CARE MEDICINE   Name: [name] [name] MRN: [id number] DOB: 01-21-1950    ADMISSION DATE:  08/04/2016 CONSULTATION DATE:  08/04/2016  REFERRING MD:  Dr. Winfred Leeds  CHIEF COMPLAINT:  Cardiac Arrest  HISTORY OF PRESENT ILLNESS:  Patient is encephalopathic; therefore HPI obtained from chart review- no prior hx in EMR.  No family available.  66 year old male with unknown past medical history who arrived by [facility] after witnessed collapse in [place].  Bystander CPR for 2-3 mins.  AED was placed and shocked once.  On EMS arrival, patient was in [place] 120 with pulses.  On arrival to ER, patient was unresponsive with GCS of 3 and agonal and therefore intubated for airway protection.  Initial labs with WBC 17k, glucose 142, K 3.3.  Cardiology consulted for concern of ACS with EKG showing prior anterior and inferior infarcts.   Abd noted to be firm and distended.  CXR shows massive pneumoperitoneum.  Surgery consulted and at bedside.  PCCM to admit.    Addendum: patient found to have additional chart on MRN [id number] with chart up for merger.  PMH of HTN, CAD, HLD, tobacco abuse, elevated LFTs, and BPH and NKDA.    PAST MEDICAL HISTORY :  He  has no past medical history on file.  PAST SURGICAL HISTORY: He  has no past surgical history on file.  Allergies not on file  No current facility-administered medications on file prior to encounter.    No current outpatient prescriptions on file prior to encounter.    FAMILY HISTORY:  His has no family status information on file.    SOCIAL HISTORY: unknown  REVIEW OF SYSTEMS:   Unable to assess as patient is unresponsive and intubated.  SUBJECTIVE:  Patient to CT scanner for stat CT head, cervical, chest, abd/pelvis. Hypertensive and unresponsive on propofol 5 mcg/kg/min, s/p etomidate and rocuronium at [place]: BP (!) 165/122   Pulse 82   Resp 16   SpO2 100%   HEMODYNAMICS:    VENTILATOR  SETTINGS: Vent Mode: PRVC FiO2 (%):  [100 %] 100 % Set Rate:  [16 bmp] 16 bmp Vt Set:  [550 mL] 550 mL PEEP:  [5 cmH20] 5 cmH20 Plateau Pressure:  [19 cmH20] 19 cmH20  INTAKE / OUTPUT: No intake/output data recorded.  PHYSICAL EXAMINATION: General:  Adult male unresponsive on MV HEENT: [place]/AT, MM pink/moist, ETT/ OGT, c-collar in place, pupils 3/=/sluggish Neuro: Unresponsive (pt s/p rocuronium) CV: s1s2 rrr, no murmur PULM: even/non-labored on MV, lungs bilaterally clear, scattered rhonchi GI: firm, distended, absent BS  Extremities: cool/dry, no edema  Skin: no rashes or lesions  LABS:  BMET  Recent Labs Lab 08/04/16 1600  NA 142  K 3.3*  CL 101  BUN 6  CREATININE 0.70  GLUCOSE 151*    Electrolytes No results for input(s): CALCIUM, MG, PHOS in the last 168 hours.  CBC  Recent Labs Lab 08/04/16 1557 08/04/16 1600  WBC 17.6*  --   HGB 15.7 17.0  HCT 45.1 50.0  PLT 378  --     Coag's No results for input(s): APTT, INR in the last 168 hours.  Sepsis Markers No results for input(s): LATICACIDVEN, PROCALCITON, O2SATVEN in the last 168 hours.  ABG  Recent Labs Lab 08/04/16 1641  PHART 7.370  PCO2ART 47.1  PO2ART 512.0*    Liver Enzymes No results for input(s): AST, ALT, ALKPHOS, BILITOT, ALBUMIN in the last 168 hours.  Cardiac Enzymes No results for  input(s): TROPONINI, PROBNP in the last 168 hours.  Glucose  Recent Labs Lab 08/04/16 1546  GLUCAP 142*    Imaging Dg Chest Portable 1 View  Result Date: 08/04/2016 CLINICAL DATA:  Cardiorespiratory arrest. Cardiopulmonary resuscitation. EXAM: PORTABLE CHEST 1 VIEW COMPARISON:  None. FINDINGS: Massive pneumoperitoneum. Endotracheal tube tip in satisfactory position approximately 5 cm above the carina. Nasogastric tube courses below the diaphragm into the stomach. Cardiac silhouette moderately enlarged. Lungs clear. Bronchovascular markings normal. Pulmonary vascularity normal. No visible  pleural effusions. No pneumothorax. IMPRESSION: 1. Massive pneumoperitoneum. 2.  Support apparatus satisfactory. 3. Moderate cardiomegaly.  No acute cardiopulmonary disease. I personally telephoned these emergent results at the time of interpretation on 08/04/2016 at 4:15 pm to Dr. Orlie Dakin, who verbally acknowledged these results. Electronically Signed   By: Evangeline Dakin M.D.   On: 08/04/2016 16:18   STUDIES:  CXR 7/23 > massive pneumoperitoneum. CT chest 7/23 >  CT head 7/23 >  CT A / P 7/23 >   CULTURES: Blood 7/23 >   ANTIBIOTICS: Zosyn 7/23 >  Eraxis 7/23 >   SIGNIFICANT EVENTS: 7/23 > admit.  LINES/TUBES: ETT 7/23 >  OGT 7/23 >  DISCUSSION: 66 y.o. male admitted 7/23 after collapsed at [facility] and suffered VF/VT arrest.  Required 1 shock and 2 - 3 minutes CPR before ROSC.  Brought to ED where he was intubated and incidentally found to have massive pneumoperitoneum.  CCS consulted and planning to take to OR.  ASSESSMENT / PLAN:  PULMONARY A: Acute hypoxic respiratory failure - s/p intubation in ED 7/23. Tobacco abuse P:   Full vent support. Wean as able. VAP prevention measures. SBT in AM if mental status allows (unless TTM started post operatively, then defer SBT until off paralytics). CXR in AM. Tobacco cessation  CARDIOVASCULAR A:  VF/VT arrest - unclear etiology.   Required 1 shock and 2 - 3 minutes CPR before ROSC. 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 Hx HTN, HLD, and CAD P:  Cardiology following Trend troponins. Assess echo. Defer TTM for now and wait until post op and if head CT OK.  If no gross contamination and no large amount of blood loss, then can consider TTM given mental status.  RENAL A:   Hypokalemia. Hypocalcemia. P:   NS @ 125. BMP in AM.  GASTROINTESTINAL A:   Pneumoperitoneum - unclear source but suspect colon perf given large volume free air. GI prophylaxis. Nutrition. P:   CCS  consulted, planning to take to OR after CT scans completed. SUP: Pantoprazole.  Hold PPI infusion for now unless CT shows concern for gastric ulcer perf. NPO. Add LFT  HEMATOLOGIC A:   VTE Prophylaxis. P:  SCD's / heparin. CBC in AM.  INFECTIOUS A:   At risk peritonitis. P:   Abx as above (zosyn, eraxis).  Follow cultures as above. PCT algorithm to limit abx exposure.  ENDOCRINE A:   No acute issues. P:   SSI if glucose consistently > 180.  NEUROLOGIC A:   Acute encephalopathy - exam out of proportion to reported downtime. ? ETOH use P:   Sedation:  Propofol gtt / Fentanyl PRN. RASS goal: 0 to -1. Daily WUA (unless TTM started post operatively, then defer SBT until off paralytics). Assess EEG. May need neuro consult. Continue c-collar Add thiamine and folate  Family updated: None available.  Interdisciplinary Family Meeting v Palliative Care Meeting:  Due by: 08/11/16.  CC time: 35 min.  Kennieth Rad,  AGACNP-BC [facility] Pgr: [phone] or if no answer [phone] 08/04/2016, 5:04 PM  STAFF NOTE: I, Merrie Roof, MD FACP have personally reviewed patient's available data, including medical history, events of note, physical examination and test results as part of my evaluation. I have discussed with resident/NP and other care providers such as pharmacist, RN and RRT. In addition, I personally evaluated patient and elicited key findings of: not awake, per2 mm sluggish, no cough, no gag, int may be opening eyes, collar on neck, lungs clear, heart sounds distant but regular, massive tympanic exam on abdo, distended, unable to elicit pain response, no edema, well nourished, pcxr which I reviewed showed low lung volumes secondary to massive air under diaphragm bilateral, no obvious broken ribs, he coded vt/vf at [facility] and was told initially down 4-5 min, d/w edp, likely down at least 10 min, I am worried about anoxia PLAN: to CT stat to  assess perf site, then to OR for ex lap, for now will avoid fevers, ABg reviewed keep current MV, repeat abg post op as may need higher MV with lactic 4 now which could rise, if air around stomach- add ppi drip, give empiric antifungal and zosyn stat, allow pos balance BUT NOT 30 cc/kg as he does not have SIRS and worry brain edema risk, needs eeg, add ct head and chest to abdo if able, draw Blood cult, I have d/w surgery if no sig contamination and bleeding risk low - hypothermia post op, if contamination and bleeding a concern - normothermia, I see no family at bedside to update, asses trop and echo follow up, repeat lactic acid needed post op The patient is critically ill with multiple organ systems failure and requires high complexity decision making for assessment and support, frequent evaluation and titration of therapies, application of advanced monitoring technologies and extensive interpretation of multiple databases.   Critical Care Time devoted to patient care services described in this note is 40 Minutes. This time reflects time of care of this signee: Merrie Roof, MD FACP. This critical care time does not reflect procedure time, or teaching time or supervisory time of PA/NP/Med student/Med Resident etc but could involve care discussion time. Rest per NP/medical resident whose note is outlined above and that I agree with   Lavon Paganini. Titus Mould, MD, [place] Pgr: [facility] 08/04/2016 5:15 PM

## 2016-08-04 NOTE — Progress Notes (Signed)
[facility] Progress Note Patient Name: Qunicy Higinbotham DOB: 03-Jul-1950 MRN: [id number]   Date of Service  08/04/2016  HPI/Events of Note  Hypotension  eICU Interventions  Levophed, do not exceed 10 mcg     Intervention Category Major Interventions: Hypotension - evaluation and management  Galya Dunnigan 08/04/2016, 10:40 PM

## 2016-08-04 NOTE — OR Nursing (Signed)
Patient entered OR with Cervical collar in place, remained in placed and transferred to ICU with collar

## 2016-08-04 NOTE — Code Documentation (Signed)
Pt remains unresponsive at this time.  Right pupil slightly larger than left

## 2016-08-04 NOTE — Transfer of Care (Signed)
Immediate Anesthesia Transfer of Care Note  Patient: [name] [name]  Procedure(s) Performed: Procedure(s): EXPLORATORY LAPAROTOMY FREE AIR BIOPSY OF PERFORATED STOMACH ULCER REPAIR OF PERFORATED STOMACH ULCER WITH A OMENTAL PATCH (N/A)  Patient Location: SICU  Anesthesia Type:General  Level of Consciousness: sedated and Patient remains intubated per anesthesia plan  Airway & Oxygen Therapy: Patient remains intubated per anesthesia plan and Patient placed on Ventilator (see vital sign flow sheet for setting)  Post-op Assessment: Report given to RN and Post -op Vital signs reviewed and stable  Post vital signs: Reviewed and stable  Last Vitals:  Vitals:   08/04/16 1645 08/04/16 1700  BP: (!) 160/116 (!) 149/111  Pulse: 84 82  Resp: 11 16    Last Pain: There were no vitals filed for this visit.       Complications: No apparent anesthesia complications

## 2016-08-04 NOTE — Progress Notes (Signed)
[facility] Progress Note Patient Name: [name] [name] DOB: 1950-01-31 MRN: [id number]   Date of Service  08/04/2016  HPI/Events of Note  Troponin #1 = 1.01. Likely d/t cardiac arrest. Recent surgery, therefore, will try to avoid ASA and Heparin IV infusion . BP = 96/64 on Norepinephrine IV infusion, therefore, will avoid B- Blocker.  eICU Interventions  Will continue to trend serial Troponin levels.      Intervention Category Intermediate Interventions: Diagnostic test evaluation  Lysle Dingwall 08/04/2016, 11:32 PM

## 2016-08-04 NOTE — ED Triage Notes (Signed)
Pt to ED via [place] after reported collapsing in [place].  Pt was placed on AED and shocked x's 1.  On arrival to ED pt on a non-rebreather with agonal resp.

## 2016-08-04 NOTE — Progress Notes (Signed)
   Family (mother) notified of pt. location.  Mother #: [id number]  Mother notifying wife.   Will follow, as needed.Marland Kitchen

## 2016-08-04 NOTE — ED Notes (Signed)
Dr. Kae Heller in to assess pt at this time

## 2016-08-04 NOTE — Consult Note (Signed)
Cardiology Consultation:   Patient ID: [name] [name]; [id number]; 1950-09-26   Admit date: 08/04/2016 Date of Consult: 08/04/2016  Primary Care Provider: No primary care provider on file. Primary Cardiologist: New to Dr. Ellyn Hack  Chief Complaint: collapsed at [facility]  Patient Profile:   [name] [name] is a 66 y.o. male with unknown PMH, no family present who is being seen today for the evaluation of cardiac arrest at the request of Dr. Naomie Dean.    History of Present Illness:   Patient is unresponsive with no family present and no prior medical history in EMR. Per discussion with ED staff and EMS staff, the patient was at [facility] by the shopping carts and apparently passed out with possible seizure activity. EMS states that they were told that someone in a [employer] brought over an AED prior to their arrival, applied it, and shock was advised/delivered. There was report of CPR. When EMS arrived, the patient had pulses but remained unresponsive. On the way to the hospital he apparently would withdraw his right arm from pain. He was reported to be breathing on his own but was intubated for airway protection in ED. He was tachycardic and hypotensive upon arrival to the ED. Initial labs showed WBC 17k, K 3.3, glucose 142. Abd distended and abd plain film with concern for pneumoperitoneum. Did not receive any sedation or anticoag in the field.  Past Medical History: Unable to obtain due to pt unresponsive  Surgical History: Unable to obtain due to pt unresponsive  Inpatient Medications: Scheduled Meds:  Continuous Infusions: . propofol     PRN Meds:   Allergies:   Allergies not on file - Unable to obtain due to pt unresponsive   Social History:  - Unable to obtain due to pt unresponsive   Family History:   The patient's family history is unknown. Unable to obtain due to pt unresponsive.  ROS:  Please see the history of present illness.  All other ROS  reviewed and negative.     Physical Exam/Data:   Vitals:   08/04/16 1601  BP: (!) 180/118  Pulse: (!) 103  Resp: 16  SpO2: 100%   No intake or output data in the 24 hours ending 08/04/16 1606  General: Well developed thin WM in no acute distress. Head: Normocephalic, atraumatic, sclera non-icteric, no xanthomas, nares are without discharge.  Neck: Negative for carotid bruits. JVD not elevated. Lungs: Coarse mechanical BS Heart: Reg rhythm, tachycardic. No murmurs, rubs, or gallops appreciated. Abdomen: Rounded, taught/distended, tympanic, no BS present Msk: Tone appears normal, mildly atrophied Extremities: No clubbing or cyanosis. No edema.  Distal pedal pulses are 2+ and equal bilaterally. Neuro: Unresponsive, R pupil slightly larger than L pupil, equally responsive Psych:  Unable to assess  EKG:  The EKG was personally reviewed and demonstrates: 1) initial tracing sinus tach, prior anterior and inferior infarct, nonspecific ST-T changes 2) sinus tach 124bpm, prior inferior and anterior infarct, nonspecific diffuse ST-T changes with subtle ST depression I, II, avL, V6 with TWI I, avL, no STEMI  Relevant CV Studies: N/A  Laboratory Data: Istats summarized above  Radiology/Studies:  No results found.  Assessment and Plan:   1. Presumed VT/VF arrest (based on advised AED shock) 2. High suspicion for perforated viscus per EDP 3. Prior MI by EKG 4. Elevated blood pressure in ED, unknown if prior history of HTN 5. Leukocytosis 6. Hypokalemia  The patient was seen and examined acutely by myself and Dr. Ellyn Hack. Initial concern  was for ACS but noted that patient had distended abdomen with tympanic BS. Plain film revealed concern for pneumoperitoneum, thus now concern is that patient had a perforated viscus contributing to cardiac arrest. Further management will be deferred to surgery/PCCM for now. When he has been medically stabilized would recommend 2D echocardiogram. Will  follow peripherally.   Signed, Charlie Pitter, PA-C  08/04/2016 4:06 PM    I have seen, examined and evaluated the patient this PM along with Ms. Idolina Primer, PA-C.  After reviewing all the available data and chart, we discussed the patients laboratory, study & physical findings as well as symptoms in detail. I agree with her findings, examination as well as impression recommendations as per our discussion.    Mr. [name] [name] has no real known history as there was no family members around during his resuscitation. Apparently he was at [facility] and had a witnessed arrest (seen on video camera as "seizure-like activity ") with bystander CPR and AED treatment of what is most likely VF or VT arrest. He had return of perfusing cardiac rhythm and is now hypertensive. Upon EMS arrival, he was Glasgow Coma Scale score of 3 with minimal responsiveness and agonal breathing upon arrival to the ER where he was intubated.  EKG does not show signs of any acute cardiac event. He does have anteroseptal Q waves which suggests possible old anterior MI which would've made and potentially susceptible to a cardiac arrhythmia, however the absence of ST elevations makes it difficult to fully determine if this is an acute ischemic event.  More concerning would, on exam he is very distended and tympanitic abdomen and found to have pneumoperitoneum on chest x-ray. At this point I think the best course of action is to delay ischemic evaluation with cardiac catheterization until he is stabilized from a surgical standpoint.  .  Cardiac catheterization this point would not be helpful as we would not be of perform any corrective action besides the diagnostic evaluation.  Will order 2-D echocardiogram to assess LV function for this evening as it seems the plan is to be somewhat conservative with full body scan to assess the cause of the pneumoperitoneum.   Cardiology will follow along and provide further assistance based on the echocardiogram  results. Once he is stabilized surgically and metabolically, we can consider ischemic evaluation.   Glenetta Hew, M.D., M.S. Interventional Cardiologist   Pager # [phone] Phone # [phone] [address] [place], [place] [address]

## 2016-08-04 NOTE — Code Documentation (Signed)
Dr. Ellyn Hack in to assess pt at this time

## 2016-08-04 NOTE — Op Note (Signed)
08/04/2016  6:20 PM  PATIENT:  [name] [name]  66 y.o. male  PRE-OPERATIVE DIAGNOSIS:  Free air  POST-OPERATIVE DIAGNOSIS:  Perforated stomach  PROCEDURE:  Procedure(s): EXPLORATORY LAPAROTOMY REPAIR PERFORATED STOMACH WITH OMENTAL PATCH  SURGEON:  Georganna Skeans, M.D.  ASSISTANTS: Romana Juniper, MD   ANESTHESIA:   general  EBL:  Total I/O In: 250 [IV Piggyback:250] Out: 17 [Urine:15; Blood:50]  BLOOD ADMINISTERED:none  DRAINS: (1) Jackson-Pratt drain(s) with closed bulb suction in the upper abd   SPECIMEN:  Biopsy / Limited Resection  DISPOSITION OF SPECIMEN:  PATHOLOGY  COUNTS:  YES  DICTATION: .Dragon Dictation Findings: Perforation of stomach along the lesser curvature  Procedure in detail: [name] is brought emergently to the operating room after collapse at [facility]. He underwent CPR. He also had cardioversion of ventricular fibrillation. After arriving at the emergency department, chest x-ray demonstrated free air. He was intubated. CT scan shows stomach perforation. He is brought emergently to the operating room for exploration. Emergency consent was obtained from his family. He received intravenous antibiotics. General anesthetic was administered by the anesthesia staff. Foley catheter was placed by nursing. His abdomen was prepped and draped in sterile fashion. We did a time out procedure. Upper midline incision was made. Subcutaneous tissues were dissected down revealing the anterior fascia. This was divided sharply along the midline. The peritoneal cavity was entered releasing a large volume of air. There was no significant foul odor. The abdomen was explored and there was a perforation along the lesser curvature of the stomach with contamination in the left upper quadrant. The contamination was suctioned out. The perforation was inspected. It was soft and about 2 cm long. A biopsy was taken of the edge and sent to pathology. The perforation was then closed  with interrupted 2-0 silk sutures with the tails left long. Next, we fashioned a tongue of omentum to lay across this repair. It was tied down securely with the suture ends. The abdomen was copiously irrigated with warm saline. A 19 [place] Blake drain was placed in the abdomen and exited the right abdomen. It was secured with nylon. Hemostasis was ensured. The repair appeared intact. Fascia was closed with running #1 PDS tied in the middle. Subcutaneous tissues were packed with wet-to-dry dressing and a sterile fashion. All counts were correct. He tolerated the procedure well and was taken directly to the intensive care unit in critical condition on the ventilator. PATIENT DISPOSITION:  ICU - intubated and critically ill.   Delay start of Pharmacological VTE agent (>24hrs) due to surgical blood loss or risk of bleeding:  no  Georganna Skeans, MD, MPH, FACS Pager: [phone]  7/23/20186:20 PM

## 2016-08-04 NOTE — Progress Notes (Signed)
   Family (brother) currently in ED consult A.

## 2016-08-04 NOTE — Progress Notes (Signed)
Elevated troponin result called to [facility]

## 2016-08-04 NOTE — OR Nursing (Signed)
Patient transported from ER to OR intubated with cervical collar in place, transported to OR, procedure completed, transported to ICU (2H-bed 4) with 19French blake drain and foley). Report given to RN at bedside.

## 2016-08-04 NOTE — Progress Notes (Signed)
Pharmacy Antibiotic Note [name] [name] is a 66 y.o. male admitted on 08/04/2016 with pneumoperitoneum and taken emergently to OR.  Pharmacy has been consulted for Zosyn and Eraxis dosing.  Plan: 1. Zosyn 3.375g IV q8h (4 hour infusion).  2. Eraxis 200 mg x 1 followed by 100 mg every 24 hours starting on 7/24.  Weight: 165 lb 5.5 oz (75 kg)  No data recorded.   Recent Labs Lab 08/04/16 1557 08/04/16 1600 08/04/16 1647  WBC 17.6*  --   --   CREATININE  --  0.70  --   LATICACIDVEN  --   --  4.20*    CrCl cannot be calculated (Unknown ideal weight.).    Not on File  Antimicrobials this admission: 7/23 Zosyn >>  7/23 Eraxis >>   Microbiology results: px  Thank you for allowing pharmacy to be a part of this patient's care.  Vincenza Hews, PharmD, BCPS 08/04/2016, 5:19 PM

## 2016-08-04 NOTE — Anesthesia Procedure Notes (Signed)
Arterial Line Insertion Start/End7/23/2018 5:49 PM Performed by: WHITEMurvin Natal Pattijo Juste, CRNA  Preanesthetic checklist: patient identified Right, radial was placed Catheter size: 20 G Hand hygiene performed  and maximum sterile barriers used   Attempts: 2 Procedure performed without using ultrasound guided technique. Following insertion, Biopatch and dressing applied. Post procedure assessment: normal  Patient tolerated the procedure well with no immediate complications. Additional procedure comments: Emergent art-line placement.

## 2016-08-04 NOTE — Progress Notes (Addendum)
We initially tried to see if we could get echo stat, but in talking with surgery and echo tech availability, it would not be able to get done in the time between the CT scans and planned emergent surgery. Spoke with Dr. Ellyn Hack - will change order to routine, OK to do and follow-up on tomorrow. I spoke with ED nurse to relay to surgeon. Dayna Dunn PA-C

## 2016-08-04 NOTE — Consult Note (Signed)
Surgical Consultation Requesting provider: Dr. Winfred Leeds  CC: pneumoperitoneum  HPI: This history is gathered from other providers and family and chart review due to patient intubated and unconscious. This is a 66yo gentleman who collapsed in [place] his afternoon. Possible seizure activity was reported by bystanders. Someone brought an AED which was applied and shock advised and reportedly CPR was performed. On EMS arrival the patient was unresponsive but pulses were present . RUE withdrawal was noted en route, had spontanesous respirations but was intubated on arrival to ER due to airway protection. He was reportedly groaning but otherwise non-interactive in the ER prior to intubatiuon. Post-intubation x rays revealed large volume pneumoperitoneum. Unable to determine due to patient intubated and unconscious. No family available to report.   Allergies not on file Unable to determine due to patient intubated and unconscious. None per family.   No past medical history on file. Unable to determine due to patient intubated and unconscious. Family reports MI in 1999 treated medically.   No past surgical history on file. Unable to determine due to patient intubated and unconscious. None per family.   No family history on file. Unable to determine due to patient intubated and unconscious. None   Social history Unable to determine due to patient intubated and unconscious. Per family he smokes and does drink some alcohol but only 2 glasses of wine or so daily. He is a retired Biomedical scientist.   Social History   Social History  . Marital status: N/A    Spouse name: N/A  . Number of children: N/A  . Years of education: N/A   Social History Main Topics  . Smoking status: Not on file  . Smokeless tobacco: Not on file  . Alcohol use Not on file  . Drug use: Unknown  . Sexual activity: Not on file   Other Topics Concern  . Not on file   Social History Narrative  . No narrative on file    No current  facility-administered medications on file prior to encounter.    No current outpatient prescriptions on file prior to encounter.    Review of Systems: a complete, 10pt review of systems Unable to determine due to patient intubated and unconscious. No family available to report  Physical Exam: Vitals:   08/04/16 1600 08/04/16 1601  BP: (!) 181/131 (!) 180/118  Pulse: (!) 116 (!) 103  Resp: (!) 33 16   Gen: intubated, unresponsive Head: no scleral icterus,  Neck: supple without mass or thyromegal Chest: symmetrical air entry, clear bilaterally Cardiovascular: RRR with palpable distal pulses Abdomen: distended, firm, tympanic. No palpable mass or organomegaly Extremities: warm, without edema, no deformities Neuro: GCS 3T Psych: unable to assess due to unconscious Skin: warm and dry  CBC Latest Ref Rng & Units 08/04/2016 08/04/2016  WBC 4.0 - 10.5 K/uL - 17.6(H)  Hemoglobin 13.0 - 17.0 g/dL 17.0 15.7  Hematocrit 39.0 - 52.0 % 50.0 45.1  Platelets 150 - 400 K/uL - 378    CMP Latest Ref Rng & Units 08/04/2016  Glucose 65 - 99 mg/dL 151(H)  BUN 6 - 20 mg/dL 6  Creatinine 0.61 - 1.24 mg/dL 0.70  Sodium 135 - 145 mmol/L 142  Potassium 3.5 - 5.1 mmol/L 3.3(L)  Chloride 101 - 111 mmol/L 101    No results found for: INR, PROTIME  Imaging: Ct head, neck chest/ab/pelv pending. On my prelim read he has extrav of gastrograffin from anterior stomach.   A/P: 66yo male collapse of unknown etiology. Pneumoperitoneum  and likely gastric perf. To OR this evening. I discussed the plan with his brother, wife and daughter, who consent to surgery this evening on his behalf.  Appreciate admission and ongoing workup by [facility] and cardiology consulting.    Romana Juniper, MD [facility], [place] Pager [phone]

## 2016-08-04 NOTE — ED Provider Notes (Signed)
Level V caveat unresponsive. History is obtained from paramedics. Patient was found unresponsive in [place]. He reportedly fell to the ground and struck the back of his head. Questionable seizure activity . First responders performed CPR attached AED to the patient which shocked him one time. CBG performed by EMS was 175. EMS treated patient with supplemental oxygen. Patient arrived with Glasgow Coma Score of 3 unresponsive. HEENT exam normocephalic atraumatic eyes pupils 2-3 mm reactive to light bilaterally neck without step-off lungs clear breath sounds heart regular rate and rhythm abdomen distended and tympanitic all 4 extremities without contusion abrasion or tenderness. Skin mottled. Code STEMI called in the field by me. He was intubated by Dr.Colson. I supervised the procedure and was present during the entire procedure. Dr. Ellyn Hack from cardiology service arrived.. Portable Chest x-ray shows ET tube with good placement and pneumoperitoneum. In light of pneumoperitoneum vehicle care service and general surgery service consulted. Dr. Titus Mould critical care service arrived as well general surgery service consult. Patient will be admitted to the intensive care unit. IV antibiotics ordered by intensivist. Cardiology Dr. Ellyn Hack has consulted on the patient and is available at the other services'  request  Diagnosis #1 card or pulmonary arrest #2 pneumoperitoneum CRITICAL CARE Performed by: Orlie Dakin Total critical care time: 60 minutes Critical care time was exclusive of separately billable procedures and treating other patients. Critical care was necessary to treat or prevent imminent or life-threatening deterioration. Critical care was time spent personally by me on the following activities: development of treatment plan with patient and/or surrogate as well as nursing, discussions with consultants, evaluation of patient's response to treatment, examination of patient, obtaining history from  patient or surrogate, ordering and performing treatments and interventions, ordering and review of laboratory studies, ordering and review of radiographic studies, pulse oximetry and re-evaluation of patient's condition.   Orlie Dakin, MD 08/04/16 [phone]

## 2016-08-04 NOTE — Anesthesia Procedure Notes (Incomplete)
Procedures

## 2016-08-04 NOTE — Progress Notes (Signed)
[facility] Progress Note Patient Name: [name] [name] DOB: 1950/02/04 MRN: [id number]   Date of Service  08/04/2016  HPI/Events of Note  Call from Dr. Grandville Silos, stomach rupture with contamination of peritoneal cavity  eICU Interventions  No cooling due to infection risk.     Intervention Category Major Interventions: Other:  YACOUB,WESAM 08/04/2016, 6:58 PM

## 2016-08-04 NOTE — ED Notes (Signed)
To CT at this time.

## 2016-08-04 NOTE — Progress Notes (Signed)
Patient ID: [name] [name], male   DOB: 1950-05-08, 66 y.o.   MRN: [id number] I agree with Dr. Ron Parker assessment. S/P CPR. Free air with evidence of perforated stomach on CT. Will proceed with emergency exploratory laparotomy.   Georganna Skeans, MD, MPH, FACS Trauma: [phone] General Surgery: [phone]

## 2016-08-04 NOTE — Anesthesia Preprocedure Evaluation (Signed)
Anesthesia Evaluation    Reviewed: Unable to perform ROS - Chart review onlyPreop documentation limited or incomplete due to emergent nature of procedure.  Airway Mallampati: Intubated       Dental   Pulmonary           Cardiovascular + dysrhythmias Ventricular Tachycardia and Ventricular Fibrillation   S/P cardiac arrest and resuscitation today   Neuro/Psych    GI/Hepatic   Endo/Other    Renal/GU      Musculoskeletal   Abdominal   Peds  Hematology   Anesthesia Other Findings   Reproductive/Obstetrics                             Anesthesia Physical Anesthesia Plan  ASA: IV and emergent  Anesthesia Plan: General   Post-op Pain Management:    Induction:   PONV Risk Score and Plan:   Airway Management Planned:   Additional Equipment:   Intra-op Plan:   Post-operative Plan:   Informed Consent:   Plan Discussed with:   Anesthesia Plan Comments:         Anesthesia Quick Evaluation

## 2016-08-04 NOTE — ED Notes (Signed)
Pt's CBG result was 142. Informed Melissa - RN & Maudie Mercury - RN.

## 2016-08-05 ENCOUNTER — Inpatient Hospital Stay (HOSPITAL_COMMUNITY): Payer: PPO

## 2016-08-05 ENCOUNTER — Encounter (HOSPITAL_COMMUNITY): Payer: Self-pay | Admitting: General Surgery

## 2016-08-05 DIAGNOSIS — J9600 Acute respiratory failure, unspecified whether with hypoxia or hypercapnia: Secondary | ICD-10-CM

## 2016-08-05 LAB — CBC
HCT: 38.2 % — ABNORMAL LOW (ref 39.0–52.0)
HEMATOCRIT: 38.8 % — AB (ref 39.0–52.0)
HEMOGLOBIN: 13.4 g/dL (ref 13.0–17.0)
Hemoglobin: 13.2 g/dL (ref 13.0–17.0)
MCH: 34.6 pg — AB (ref 26.0–34.0)
MCH: 34.6 pg — ABNORMAL HIGH (ref 26.0–34.0)
MCHC: 34.6 g/dL (ref 30.0–36.0)
MCHC: 34.5 g/dL (ref 30.0–36.0)
MCV: 100 fL (ref 78.0–100.0)
MCV: 100.3 fL — ABNORMAL HIGH (ref 78.0–100.0)
PLATELETS: 281 10*3/uL (ref 150–400)
Platelets: 261 10*3/uL (ref 150–400)
RBC: 3.82 MIL/uL — ABNORMAL LOW (ref 4.22–5.81)
RBC: 3.87 MIL/uL — ABNORMAL LOW (ref 4.22–5.81)
RDW: 13.8 % (ref 11.5–15.5)
RDW: 14 % (ref 11.5–15.5)
WBC: 13.8 10*3/uL — ABNORMAL HIGH (ref 4.0–10.5)
WBC: 16.2 10*3/uL — AB (ref 4.0–10.5)

## 2016-08-05 LAB — POCT I-STAT 3, ART BLOOD GAS (G3+)
ACID-BASE DEFICIT: 13 mmol/L — AB (ref 0.0–2.0)
Acid-base deficit: 3 mmol/L — ABNORMAL HIGH (ref 0.0–2.0)
BICARBONATE: 10.9 mmol/L — AB (ref 20.0–28.0)
Bicarbonate: 20.5 mmol/L (ref 20.0–28.0)
O2 SAT: 99 %
O2 Saturation: 99 %
PCO2 ART: 32.8 mmHg (ref 32.0–48.0)
PH ART: 7.405 (ref 7.350–7.450)
PO2 ART: 126 mmHg — AB (ref 83.0–108.0)
Patient temperature: 98.9
TCO2: 12 mmol/L (ref 0–100)
TCO2: 21 mmol/L (ref 0–100)
pCO2 arterial: 20 mmHg — ABNORMAL LOW (ref 32.0–48.0)
pH, Arterial: 7.346 — ABNORMAL LOW (ref 7.350–7.450)
pO2, Arterial: 130 mmHg — ABNORMAL HIGH (ref 83.0–108.0)

## 2016-08-05 LAB — BASIC METABOLIC PANEL
ANION GAP: 3 — AB (ref 5–15)
BUN: 9 mg/dL (ref 6–20)
CALCIUM: 8.2 mg/dL — AB (ref 8.9–10.3)
CO2: 22 mmol/L (ref 22–32)
Chloride: 112 mmol/L — ABNORMAL HIGH (ref 101–111)
Creatinine, Ser: 0.82 mg/dL (ref 0.61–1.24)
GFR calc Af Amer: >60 mL/min (ref >60)
GLUCOSE: 148 mg/dL — AB (ref 65–99)
Potassium: 4.2 mmol/L (ref 3.5–5.1)
SODIUM: 137 mmol/L (ref 135–145)

## 2016-08-05 LAB — MAGNESIUM
MAGNESIUM: 1.1 mg/dL — AB (ref 1.7–2.4)
Magnesium: 1.2 mg/dL — ABNORMAL LOW (ref 1.7–2.4)
Magnesium: 2 mg/dL (ref 1.7–2.4)

## 2016-08-05 LAB — GLUCOSE, CAPILLARY: GLUCOSE-CAPILLARY: 106 mg/dL — AB (ref 65–99)

## 2016-08-05 LAB — TYPE AND SCREEN
ABO/RH(D): A POS
Antibody Screen: NEGATIVE

## 2016-08-05 LAB — COMPREHENSIVE METABOLIC PANEL
ALBUMIN: 3 g/dL — AB (ref 3.5–5.0)
ALK PHOS: 30 U/L — AB (ref 38–126)
ALT: 19 U/L (ref 17–63)
ANION GAP: 7 (ref 5–15)
AST: 56 U/L — AB (ref 15–41)
BILIRUBIN TOTAL: 1.5 mg/dL — AB (ref 0.3–1.2)
BUN: 12 mg/dL (ref 6–20)
CALCIUM: 8 mg/dL — AB (ref 8.9–10.3)
CO2: 21 mmol/L — AB (ref 22–32)
Chloride: 110 mmol/L (ref 101–111)
Creatinine, Ser: 0.81 mg/dL (ref 0.61–1.24)
GFR calc Af Amer: >60 mL/min (ref >60)
GFR calc non Af Amer: >60 mL/min (ref >60)
GLUCOSE: 129 mg/dL — AB (ref 65–99)
POTASSIUM: 4.2 mmol/L (ref 3.5–5.1)
SODIUM: 138 mmol/L (ref 135–145)
TOTAL PROTEIN: 4.9 g/dL — AB (ref 6.5–8.1)

## 2016-08-05 LAB — PATHOLOGIST SMEAR REVIEW

## 2016-08-05 LAB — PHOSPHORUS: Phosphorus: 1.3 mg/dL — ABNORMAL LOW (ref 2.5–4.6)

## 2016-08-05 LAB — TROPONIN I: TROPONIN I: 1.03 ng/mL — AB (ref <0.03)

## 2016-08-05 LAB — PREALBUMIN: Prealbumin: 13.3 mg/dL — ABNORMAL LOW (ref 18–38)

## 2016-08-05 LAB — PROCALCITONIN: PROCALCITONIN: 8.79 ng/mL

## 2016-08-05 MED ORDER — PANTOPRAZOLE SODIUM 40 MG IV SOLR
40.0000 mg | Freq: Two times a day (BID) | INTRAVENOUS | Status: DC
  Administered 2016-08-05: 40 mg via INTRAVENOUS
  Filled 2016-08-05: qty 40

## 2016-08-05 MED ORDER — MAGNESIUM SULFATE 2 GM/50ML IV SOLN
2.0000 g | Freq: Once | INTRAVENOUS | Status: AC
  Administered 2016-08-05: 2 g via INTRAVENOUS
  Filled 2016-08-05: qty 50

## 2016-08-05 MED ORDER — SODIUM CHLORIDE 0.9 % IV SOLN
8.0000 mg/h | INTRAVENOUS | Status: DC
  Administered 2016-08-05 – 2016-08-08 (×8): 8 mg/h via INTRAVENOUS
  Filled 2016-08-05 (×13): qty 80

## 2016-08-05 MED ORDER — SODIUM PHOSPHATES 45 MMOLE/15ML IV SOLN
30.0000 mmol | Freq: Once | INTRAVENOUS | Status: AC
  Administered 2016-08-05: 30 mmol via INTRAVENOUS
  Filled 2016-08-05: qty 10

## 2016-08-05 NOTE — Progress Notes (Signed)
PULMONARY / CRITICAL CARE MEDICINE   Name: [name] [name] MRN: [id number] DOB: 1950/04/10    ADMISSION DATE:  08/04/2016 CONSULTATION DATE:  08/04/2016  REFERRING MD:  Dr. Winfred Leeds  CHIEF COMPLAINT:  Cardiac Arrest  HISTORY OF PRESENT ILLNESS:  Patient is encephalopathic; therefore HPI obtained from chart review- no prior hx in EMR.  No family available.  66 year old male with unknown past medical history who arrived by [facility] after witnessed collapse in [place].  Bystander CPR for 2-3 mins.  AED was placed and shocked once.  On EMS arrival, patient was in [place] 120 with pulses.  On arrival to ER, patient was unresponsive with GCS of 3 and agonal and therefore intubated for airway protection.  Initial labs with WBC 17k, glucose 142, K 3.3.  Cardiology consulted for concern of ACS with EKG showing prior anterior and inferior infarcts.   Abd noted to be firm and distended.  CXR shows massive pneumoperitoneum.  Surgery consulted and at bedside.  PCCM to admit.    Addendum: patient found to have additional chart on MRN [id number] with chart up for merger.  PMH of HTN, CAD, HLD, tobacco abuse, elevated LFTs, and BPH and NKDA.    SUBJECTIVE:  Arouses to call of name, following commands, MAE x 4, Weaning on 40%, 5/5  VITAL SIGNS: BP 108/76   Pulse 88   Temp 100 F (37.8 C) (Axillary)   Resp 15   Ht 5\' 8"  (1.727 m)   Wt 165 lb 5.5 oz (75 kg)   SpO2 100%   BMI 25.14 kg/m   HEMODYNAMICS:    VENTILATOR SETTINGS: Vent Mode: PSV;CPAP FiO2 (%):  [40 %-100 %] 40 % Set Rate:  [12 bmp-16 bmp] 12 bmp Vt Set:  [550 mL-650 mL] 650 mL PEEP:  [5 cmH20] 5 cmH20 Pressure Support:  [5 cmH20] 5 cmH20 Plateau Pressure:  [10 cmH20-19 cmH20] 10 cmH20  INTAKE / OUTPUT: I/O last 3 completed shifts: In: 2856.8 [I.V.:2136.8; IV Piggyback:720] Out: 830 [Urine:625; Drains:155; Blood:50]  PHYSICAL EXAMINATION: General:  Adult male , arouses to call of name, sedated, follows some simple  commands HEENT: [place]/AT, MM pink/moist, ETT/ OGT, c-collar in place, Pupils 3-4, and brisk Neuro: MAE x 4, attempting to follow some simple commands despite light sedation ( propofol gtt) CV: s1s2 rrr, no murmur,SR per monitor  PULM: even/non-labored on CPAP/PS , lungs bilaterally clear, scattered rhonchi GI: firm, distended, absent BS , dressing intact with JP drain with sero sang drainage Extremities:warm and dry,, no edema, no obvious deformities  Skin: no rashes or lesions, warm dry and intact  LABS:  BMET  Recent Labs Lab 08/04/16 1600 08/05/16 0247  NA 142 137  K 3.3* 4.2  CL 101 112*  CO2  --  22  BUN 6 9  CREATININE 0.70 0.82  GLUCOSE 151* 148*    Electrolytes  Recent Labs Lab 08/05/16 0247  CALCIUM 8.2*  MG 1.1*  PHOS 1.3*    CBC  Recent Labs Lab 08/04/16 1557 08/04/16 1600 08/05/16 0247  WBC 17.6*  --  13.8*  HGB 15.7 17.0 13.2  HCT 45.1 50.0 38.2*  PLT 378  --  281    Coag's No results for input(s): APTT, INR in the last 168 hours.  Sepsis Markers  Recent Labs Lab 08/04/16 1647 08/04/16 1902 08/04/16 2142 08/05/16 0247  LATICACIDVEN 4.20* 1.4 1.6  --   PROCALCITON  --  0.25  --  8.79    ABG  Recent Labs  Lab 08/04/16 1641 08/05/16 0504 08/05/16 0513  PHART 7.370 7.346* 7.405  PCO2ART 47.1 20.0* 32.8  PO2ART 512.0* 126.0* 130.0*    Liver Enzymes No results for input(s): AST, ALT, ALKPHOS, BILITOT, ALBUMIN in the last 168 hours.  Cardiac Enzymes  Recent Labs Lab 08/04/16 2150 08/05/16 0247  TROPONINI 1.01* 1.03*    Glucose  Recent Labs Lab 08/04/16 1546  GLUCAP 142*    Imaging Ct Abdomen Pelvis Wo Contrast  Addendum Date: 08/04/2016   ADDENDUM REPORT: 08/04/2016 17:57 ADDENDUM: Imaging findings and results were discussed with Dr. Georganna Skeans upon completion of the study by Dr. Laurence Ferrari. Electronically Signed   By: Donavan Foil M.D.   On: 08/04/2016 17:57   Result Date: 08/04/2016 CLINICAL DATA:  Collapsed  in [employer], post CPR EXAM: CT CHEST, ABDOMEN AND PELVIS WITHOUT CONTRAST TECHNIQUE: Multidetector CT imaging of the chest, abdomen and pelvis was performed following the standard protocol without IV contrast. COMPARISON:  Radiograph 08/04/2016 FINDINGS: CT CHEST FINDINGS Cardiovascular: Limited evaluation without intravenous contrast. Aortic atherosclerosis. Coronary artery calcification. 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 Small amount of pericardial effusion. Mild cardiomegaly. Mediastinum/Nodes: Endotracheal tube tip terminates several cm above the carina. No thyroid mass. Esophageal tube in the esophagus with the tip terminating near the gastric outlet. Nonspecific subcentimeter mediastinal lymph nodes. Lungs/Pleura: No pneumothorax. Mild emphysema. Mild bronchiectasis within the right greater than left upper lobes. No focal consolidation or pleural effusion. Musculoskeletal: Motion artifact causes false appearance of inferior sternal fracture. Irregular linear lucency through the anterior aspect of T8 vertebral body, without posterior extension, this may extend to the inferior endplate at T8. Degenerative changes. CT ABDOMEN PELVIS FINDINGS Hepatobiliary: No focal liver abnormality is seen. No gallstones, gallbladder wall thickening, or biliary dilatation. Pancreas: Unremarkable. No pancreatic ductal dilatation or surrounding inflammatory changes. Spleen: Normal in size without focal abnormality. Adrenals/Urinary Tract: Adrenal glands are unremarkable. Kidneys are normal, without renal calculi, focal lesion, or hydronephrosis. Bladder is unremarkable. Stomach/Bowel: Esophageal tube tip in the stomach. Contrast material present within the stomach and proximal small bowel. Small focal defect within the anterior aspect of the stomach/lesser curvature, series 3, image number 56 with leakage of contrast around this defect. No significant bowel wall thickening.  Vascular/Lymphatic: Aortic atherosclerosis. Non aneurysmal aorta. Scattered subcentimeter mesenteric lymph nodes. Reproductive: Prominent prostate gland. Other: Massive pneumoperitoneum. Free intraperitoneal contrast within the left abdomen. Musculoskeletal: Degenerative changes. No acute or suspicious bone lesion. IMPRESSION: 1. Massive pneumoperitoneum with free extravasation of contrast in the left hemiabdomen, consistent with viscus perforation. Apparent source is a defect within the anterior aspect/lesser curvature of the stomach. 2. Linear lucency within the T8 vertebral body is suspicious for nondisplaced fracture, likely acute. 3. Mild emphysema Critical Value/emergent results were discussed with the surgeon caring for the patient by Dr. Laurence Ferrari at the time the scan was completed. A call was placed to the emergency department at 5:46 p.m., patient is reportedly in the OR. Electronically Signed: By: Donavan Foil M.D. On: 08/04/2016 17:47   Ct Head Wo Contrast  Result Date: 08/04/2016 CLINICAL DATA:  66 year old male status post cardiac arrest and collapse at [employer] earlier today. Patient is status post CPR. EXAM: CT HEAD WITHOUT CONTRAST CT CERVICAL SPINE WITHOUT CONTRAST TECHNIQUE: Multidetector CT imaging of the head and cervical spine was performed following the standard protocol without intravenous contrast. Multiplanar CT image reconstructions of the cervical spine were also generated. COMPARISON:  None. FINDINGS: CT HEAD FINDINGS Brain: Negative for acute intracranial  hemorrhage, acute infarction, mass, mass effect, hydrocephalus or midline shift. Gray-white differentiation is preserved throughout. Mild cerebral cortical atrophy. Small bilateral remote cerebellar lacunar infarcts. Vascular: No hyperdense vessel or unexpected calcification. Skull: Normal. Negative for fracture or focal lesion. Sinuses/Orbits: No acute finding. Other: None. CT CERVICAL SPINE FINDINGS Alignment: Normal. Skull  base and vertebrae: No acute cervical spine fracture. There is a nondisplaced fracture through the posterior aspect of the left first rib. No primary bone lesion or focal pathologic process. Soft tissues and spinal canal: No prevertebral fluid or swelling. No visible canal hematoma. Disc levels: Multilevel cervical spondylosis. Degenerative changes are most notable at C4-C5, C5-C6 and C6-C7. Right-sided facet arthropathy at C3-C4. Left-sided facet arthropathy at C2-C3, C3-C4, C4-C5 and C7-T1. Upper chest: Negative. Other: The patient is intubated. A gastric tube is also present in the esophagus. IMPRESSION: CT HEAD 1. No acute intracranial abnormality. 2. Cerebral cortical atrophy. 3. Remote right cerebellar lacunar infarct. CT CSPINE 1. No evidence of acute cervical fracture or malalignment. 2. Nondisplaced left first rib fracture. 3. Multilevel cervical spondylosis and left greater than right facet arthropathy. Electronically Signed   By: Jacqulynn Cadet M.D.   On: 08/04/2016 17:33   Ct Chest Wo Contrast  Addendum Date: 08/04/2016   ADDENDUM REPORT: 08/04/2016 17:57 ADDENDUM: Imaging findings and results were discussed with Dr. Georganna Skeans upon completion of the study by Dr. Laurence Ferrari. Electronically Signed   By: Donavan Foil M.D.   On: 08/04/2016 17:57   Result Date: 08/04/2016 CLINICAL DATA:  Collapsed in [employer], post CPR EXAM: CT CHEST, ABDOMEN AND PELVIS WITHOUT CONTRAST TECHNIQUE: Multidetector CT imaging of the chest, abdomen and pelvis was performed following the standard protocol without IV contrast. COMPARISON:  Radiograph 08/04/2016 FINDINGS: CT CHEST FINDINGS Cardiovascular: Limited evaluation without intravenous contrast. Aortic atherosclerosis. Coronary artery calcification. 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 Small amount of pericardial effusion. Mild cardiomegaly. Mediastinum/Nodes: Endotracheal tube tip terminates several cm above the  carina. No thyroid mass. Esophageal tube in the esophagus with the tip terminating near the gastric outlet. Nonspecific subcentimeter mediastinal lymph nodes. Lungs/Pleura: No pneumothorax. Mild emphysema. Mild bronchiectasis within the right greater than left upper lobes. No focal consolidation or pleural effusion. Musculoskeletal: Motion artifact causes false appearance of inferior sternal fracture. Irregular linear lucency through the anterior aspect of T8 vertebral body, without posterior extension, this may extend to the inferior endplate at T8. Degenerative changes. CT ABDOMEN PELVIS FINDINGS Hepatobiliary: No focal liver abnormality is seen. No gallstones, gallbladder wall thickening, or biliary dilatation. Pancreas: Unremarkable. No pancreatic ductal dilatation or surrounding inflammatory changes. Spleen: Normal in size without focal abnormality. Adrenals/Urinary Tract: Adrenal glands are unremarkable. Kidneys are normal, without renal calculi, focal lesion, or hydronephrosis. Bladder is unremarkable. Stomach/Bowel: Esophageal tube tip in the stomach. Contrast material present within the stomach and proximal small bowel. Small focal defect within the anterior aspect of the stomach/lesser curvature, series 3, image number 56 with leakage of contrast around this defect. No significant bowel wall thickening. Vascular/Lymphatic: Aortic atherosclerosis. Non aneurysmal aorta. Scattered subcentimeter mesenteric lymph nodes. Reproductive: Prominent prostate gland. Other: Massive pneumoperitoneum. Free intraperitoneal contrast within the left abdomen. Musculoskeletal: Degenerative changes. No acute or suspicious bone lesion. IMPRESSION: 1. Massive pneumoperitoneum with free extravasation of contrast in the left hemiabdomen, consistent with viscus perforation. Apparent source is a defect within the anterior aspect/lesser curvature of the stomach. 2. Linear lucency within the T8 vertebral body is suspicious for  nondisplaced fracture, likely acute. 3. Mild  emphysema Critical Value/emergent results were discussed with the surgeon caring for the patient by Dr. Laurence Ferrari at the time the scan was completed. A call was placed to the emergency department at 5:46 p.m., patient is reportedly in the OR. Electronically Signed: By: Donavan Foil M.D. On: 08/04/2016 17:47   Ct Cervical Spine Wo Contrast  Result Date: 08/04/2016 CLINICAL DATA:  66 year old male status post cardiac arrest and collapse at [employer] earlier today. Patient is status post CPR. EXAM: CT HEAD WITHOUT CONTRAST CT CERVICAL SPINE WITHOUT CONTRAST TECHNIQUE: Multidetector CT imaging of the head and cervical spine was performed following the standard protocol without intravenous contrast. Multiplanar CT image reconstructions of the cervical spine were also generated. COMPARISON:  None. FINDINGS: CT HEAD FINDINGS Brain: Negative for acute intracranial hemorrhage, acute infarction, mass, mass effect, hydrocephalus or midline shift. Gray-white differentiation is preserved throughout. Mild cerebral cortical atrophy. Small bilateral remote cerebellar lacunar infarcts. Vascular: No hyperdense vessel or unexpected calcification. Skull: Normal. Negative for fracture or focal lesion. Sinuses/Orbits: No acute finding. Other: None. CT CERVICAL SPINE FINDINGS Alignment: Normal. Skull base and vertebrae: No acute cervical spine fracture. There is a nondisplaced fracture through the posterior aspect of the left first rib. No primary bone lesion or focal pathologic process. Soft tissues and spinal canal: No prevertebral fluid or swelling. No visible canal hematoma. Disc levels: Multilevel cervical spondylosis. Degenerative changes are most notable at C4-C5, C5-C6 and C6-C7. Right-sided facet arthropathy at C3-C4. Left-sided facet arthropathy at C2-C3, C3-C4, C4-C5 and C7-T1. Upper chest: Negative. Other: The patient is intubated. A gastric tube is also present in the  esophagus. IMPRESSION: CT HEAD 1. No acute intracranial abnormality. 2. Cerebral cortical atrophy. 3. Remote right cerebellar lacunar infarct. CT CSPINE 1. No evidence of acute cervical fracture or malalignment. 2. Nondisplaced left first rib fracture. 3. Multilevel cervical spondylosis and left greater than right facet arthropathy. Electronically Signed   By: Jacqulynn Cadet M.D.   On: 08/04/2016 17:33   Dg Chest Port 1 View  Result Date: 08/05/2016 CLINICAL DATA:  Respiratory failure A. EXAM: PORTABLE CHEST 1 VIEW COMPARISON:  Chest x-ray and chest CT scan of August 04, 2016 FINDINGS: The lungs are adequately inflated. The retrocardiac lung markings are coarse. There is a trace of pleural fluid on the left. There is no mediastinal shift. The heart and pulmonary vascularity are normal. The endotracheal tube tip lies 5.2 cm above the carina. The esophagogastric tube tip and proximal port project below the inferior margin of the image a left internal jugular venous catheter has its tip projecting at the junction of the internal jugular vein with the left subclavian vein. There is a drainage catheter in place that projects just inferior to the left hemidiaphragm. There are old right lateral rib fractures. IMPRESSION: No pneumoperitoneum is observed today. No pneumothorax or pneumomediastinum is observed. There is probable subsegmental atelectasis in the left lower lobe. The support tubes are in reasonable position. Electronically Signed   By: David  Martinique M.D.   On: 08/05/2016 07:32   Dg Chest Portable 1 View  Result Date: 08/04/2016 CLINICAL DATA:  Cardiorespiratory arrest. Cardiopulmonary resuscitation. EXAM: PORTABLE CHEST 1 VIEW COMPARISON:  None. FINDINGS: Massive pneumoperitoneum. Endotracheal tube tip in satisfactory position approximately 5 cm above the carina. Nasogastric tube courses below the diaphragm into the stomach. Cardiac silhouette moderately enlarged. Lungs clear. Bronchovascular markings  normal. Pulmonary vascularity normal. No visible pleural effusions. No pneumothorax. IMPRESSION: 1. Massive pneumoperitoneum. 2.  Support apparatus satisfactory. 3. Moderate cardiomegaly.  No acute cardiopulmonary disease. I personally telephoned these emergent results at the time of interpretation on 08/04/2016 at 4:15 pm to Dr. Orlie Dakin, who verbally acknowledged these results. Electronically Signed   By: Evangeline Dakin M.D.   On: 08/04/2016 16:18   STUDIES:  CXR 7/23 > massive pneumoperitoneum. CT chest 7/23 >  CT head 7/23 > No acute intracranial abnormality. Cerebral cortical atrophy. Remote right cerebellar lacunar infarct. CT A / P 7/23 > Massive pneumoperitoneum with free extravasation of contrast in the left hemiabdomen, consistent with viscus perforation. Apparent source is a defect within the anterior aspect/lesser curvature of the stomach, emphysema CXR 7/24>> No pneumoperitoneum  No pneumothorax or pneumomediastinum  probable subsegmental atelectasis in the left lower lobe.   CULTURES: Blood 7/23 >   ANTIBIOTICS: Zosyn 7/23 >  Eraxis 7/23 >   SIGNIFICANT EVENTS: 7/23 > admit VF/ VT arrest  LINES/TUBES: ETT 7/23 >  OGT 7/23 >  DISCUSSION: 66 y.o. male admitted 7/23 after collapsed at [facility] and suffered VF/VT arrest.  Required 1 shock and 2 - 3 minutes CPR before ROSC.  Brought to ED where he was intubated and incidentally found to have massive pneumoperitoneum.  CCS consulted and planning to take to OR.  ASSESSMENT / PLAN:  PULMONARY A: Acute hypoxic respiratory failure - s/p intubation in ED 7/23. Tobacco abuse Tolerating CPAP/ PS this am with good volumes  P:   Wean as able. Consider extubation Maintain oxygen saturations> 93% VAP prevention measures. SBT q  AM if mental status allows  Light sedation CXR prn. ABG prn Tobacco cessation  CARDIOVASCULAR A:  VF/VT arrest - unclear etiology.   Required 1 shock and 2 - 3 minutes CPR before  ROSC. 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 Hx HTN, HLD, and CAD Troponin slight up trend over night 1.03 Not cooling candidate as stomach rupture/ contamination of peritoneal cavity/ infx risk P:  Cardiology following, appreciate assist Trend troponins. Echo pending MAP goal > 65 12 Lead 7/25 am  RENAL A:   Hypokalemia. Hypocalcemia. Hypophos>>  Hypomag>> repleted 2 gms. Consider additional 2 grams Good UO  P:   Replete electrolytes as needed NS @ 125. CMET 7/25 Strict I&O Avoid nephrotoxic medications Maintain renal perfusion   GASTROINTESTINAL A:   Pneumoperitoneum -s/p Exploratory laparotomy with Omental patch of the stomach, 08/04/16, Dr. Lavone Neri Thompson>> JP with sero sang/ bloody drainage GI prophylaxis. Nutrition. P:   CCS consulted, planning to take to OR after CT scans completed. SUP: Pantoprazole.q 12  . NPO. Trend LFT's Wound care per CCS  HEMATOLOGIC A:   VTE Prophylaxis. Leukocytosis P:  SCD's / heparin. CBC in AM. Monitor for obvious bleeding  INFECTIOUS A:   At risk peritonitis. Leukocytosis>> peritoneal cavity contamination/ perforated stomach  Peritoneal cavity contamination 2/2 stomach rupture Lactic Acid cleared to 1.6  P:   Abx as above (zosyn, eraxis).  Follow cultures as above. PCT algorithm to limit abx exposure. Narrow ABX as susceptibilities result Trend WBC and Fever curve Lactic Acid 7/25  ENDOCRINE A:   No acute issues. P:   CBG Q 4 SSI if glucose consistently > 180.  NEUROLOGIC A:   Acute encephalopathy - exam out of proportion to reported downtime. ? ETOH use P:   Light Sedation:  Propofol gtt / Fentanyl PRN. RASS goal: 0 to -1. Daily WUA  EEG completed, results pending Consider neuro consult based results/ clinical picture Continue c-collar Add thiamine and folate  Awake and following  commands, tolerating CPAP/ PS. Consider extubation.   Family updated: Wife updated at  bedside  Interdisciplinary Family Meeting v Palliative Care Meeting:  Due by: 08/11/16.  CC time: 35 min.  Magdalen Spatz,, AGACNP-BC [facility] Pgr: [phone] or if no answer [phone] 08/05/2016, 9:45 AM  STAFF NOTE: I, Merrie Roof, MD FACP have personally reviewed patient's available data, including medical history, events of note, physical examination and test results as part of my evaluation. I have discussed with resident/NP and other care providers such as pharmacist, RN and RRT. In addition, I personally evaluated patient and elicited key findings of: not awake, NOT fc, perrl, not moving ect to command, lungs clear, collar on neck, abdo soft, wound clean, no edema, PCXR which I reviewed showed resolved air below diaphragm and no infiltrates, ett wnl, he is s/p ex lap with gastric perf, now more awake but NOT meeting mark with neurostatus for extubation, wean SBT then increase PS 10, maintain zosyn, antifungals, consider PPI drip x 48 hours more, strict NPO, avoid free water with concern brain edema risk post arest, avoid fevers, avoid temp over 37 degrees, levophed is off, PCT noted, will discuss with CCS TPN need with gastric perf, I updated family at bedside, echo pending, await eeg reports The patient is critically ill with multiple organ systems failure and requires high complexity decision making for assessment and support, frequent evaluation and titration of therapies, application of advanced monitoring technologies and extensive interpretation of multiple databases.   Critical Care Time devoted to patient care services described in this note is 35 Minutes. This time reflects time of care of this signee: Merrie Roof, MD FACP. This critical care time does not reflect procedure time, or teaching time or supervisory time of PA/NP/Med student/Med Resident etc but could involve care discussion time. Rest per NP/medical resident whose note is outlined above  and that I agree with   Lavon Paganini. Titus Mould, MD, [place] Pgr: [facility] 08/05/2016 12:09 PM

## 2016-08-05 NOTE — Progress Notes (Signed)
EEG Completed; Results Pending  

## 2016-08-05 NOTE — Care Management Note (Addendum)
Case Management Note  Patient Details  Name: [name] [name] MRN: [id number] Date of Birth: July 24, 1950  Subjective/Objective:  From home with wife, [name] [name] indep, Patient  arrived by [facility] after witnessed collapse. Bystander CPR for 2-3 mins.  AED was placed and shocked once.  On EMS arrival, patient was in [place] 120 with pulses.  On arrival to ER, patient was unresponsive with GCS of 3 and agonal and therefore intubated for airway protection.  conts on vent.  cxr showed massive pneumoperitoneum.  7/25 [place], BSN-  Massive Pneumoperitoneum,Patient is extubated today, Patient gave NCM permission to speak with wife, [name] [name],  She states patient has Medicare A, B and [employer], she will bring the insurance cards in to admission tomorrow morning.  She states also that he goes to [facility], he sees no particular MD there just who ever can see him.                   Action/Plan: NCM will follow progression.  Expected Discharge Date:                  Expected Discharge Plan:     In-House Referral:     Discharge planning Services  CM Consult  Post Acute Care Choice:    Choice offered to:     DME Arranged:    DME Agency:     HH Arranged:    HH Agency:     Status of Service:  In process, will continue to follow  If discussed at Long Length of Stay Meetings, dates discussed:    Additional Comments:  [name], [name], RN 08/05/2016, 5:12 PM

## 2016-08-05 NOTE — Progress Notes (Signed)
RT note- Patient placed back to full support, patient became restless weaning, sedation given.

## 2016-08-05 NOTE — Procedures (Signed)
ELECTROENCEPHALOGRAM REPORT  Date of Study: 08/05/2016  Patient's Name: [name] [name] MRN: [id number] Date of Birth: 08-16-1950  Referring Provider: Germain Osgood, PA-C  Clinical History: 66 year old male status post cardiac arrest.  Medications: anidulafungin (ERAXIS) 100 mg in sodium chloride 0.9 % 100 mL IVPB  chlorhexidine gluconate (MEDLINE KIT) (PERIDEX) 0.12 % solution 15 mL  etomidate (AMIDATE) injection  fentaNYL (SUBLIMAZE) norepinephrine (LEVOPHED) 4 mg  pantoprazole (PROTONIX) injection 40 mg  piperacillin-tazobactam (ZOSYN)  propofol (DIPRIVAN) 1000   Technical Summary: A multichannel digital EEG recording measured by the international 10-20 system with electrodes applied with paste and impedances below 5000 ohms performed as portable with EKG monitoring in an intubated and sedated patient.  Hyperventilation and photic stimulation were not performed.  The digital EEG was referentially recorded, reformatted, and digitally filtered in a variety of bipolar and referential montages for optimal display.   Description: The patient is intubated and sedated with propofol during the recording.  There is no discernible posterior dominant rhythm.  There diffuse 4-5 Hz theta and 2-3 Hz delta slowing in addition to faster frequencies up to 10 Hz.  Stage 2 sleep is not seen.  There were no epileptiform discharges or electrographic seizures seen.    EKG lead was unremarkable.  Impression: This sedated EEG is abnormal due to diffuse slowing of the waking background.  Clinical Correlation of the above findings indicates diffuse cerebral dysfunction that may be due to sedative medication, but also can be seen with hypoxic/ischemic injury, or toxic/metabolic encephalopathies.  Clinical correlation is advised.  Metta Clines, DO

## 2016-08-05 NOTE — Anesthesia Postprocedure Evaluation (Signed)
Anesthesia Post Note  Patient: Armaan Pond  Procedure(s) Performed: Procedure(s) (LRB): EXPLORATORY LAPAROTOMY FREE AIR BIOPSY OF PERFORATED STOMACH ULCER REPAIR OF PERFORATED STOMACH ULCER WITH A OMENTAL PATCH (N/A)     Patient location during evaluation: SICU Anesthesia Type: General Level of consciousness: patient remains intubated per anesthesia plan Pain management: pain level controlled Vital Signs Assessment: post-procedure vital signs reviewed and stable Respiratory status: patient remains intubated per anesthesia plan and patient on ventilator - see flowsheet for VS Cardiovascular status: blood pressure returned to baseline Anesthetic complications: no    Last Vitals:  Vitals:   08/04/16 2344 08/05/16 0000  BP:  91/68  Pulse:  88  Resp:  18  Temp: 36.8 C     Last Pain:  Vitals:   08/04/16 2344  TempSrc: Oral                 Marka Treloar COKER

## 2016-08-05 NOTE — Progress Notes (Signed)
1 Day Post-Op    CC:  Collapse at [facility]/AED with shock x 1 - nothing in our system prior to yesterday  Subjective: Sedated on the Vent, BP supported with Levophed.  Ng was not working and I irrigated it and replaced sump filter.  Drainage from JP is serosanguinous, still has allot of blood in it.  I will leave the dressing and we will take it down tomorrow.  I have ask them to get supplies into the room for that tomorrow.    Objective: Vital signs in last 24 hours: Temp:  [94.7 F (34.8 C)-100.8 F (38.2 C)] 98.9 F (37.2 C) (07/24 0441) Pulse Rate:  [82-116] 92 (07/24 0600) Resp:  [0-33] 9 (07/24 0600) BP: (79-185)/(62-131) 88/74 (07/24 0600) SpO2:  [99 %-100 %] 100 % (07/24 0600) Arterial Line BP: (71-157)/(54-89) 71/57 (07/24 0600) FiO2 (%):  [40 %-100 %] 40 % (07/24 0310) Weight:  [75 kg (165 lb 5.5 oz)] 75 kg (165 lb 5.5 oz) (07/23 1630)   2704 IV 625 urine Drain 150 TM 100.8, BP supported with Levophed Sats 100% on Vent ABG OK this AM Troponin 1.01 and 1.03 early this Am No labs since 2 AM will update now Last Mag 1.1 3 AM CXR 7/23 - Massive pneumoperitoneum. CT head/spine:  1. No acute intracranial abnormality. 2. Cerebral cortical atrophy. 3. Remote right cerebellar lacunar infarct. CT CSPINE  1. No evidence of acute cervical fracture or malalignment. 2. Nondisplaced left first rib fracture. 3. Multilevel cervical spondylosis and left greater than right facet arthropathy. CT abd/pelvis:Massive pneumoperitoneum with free extravasation of contrast in the left hemiabdomen,  CT chest: Contrast material present within the stomach and proximal small bowel. Small focal defect within the anterior aspect of the stomach/lesser curvature, series 3, image number 56 with leakage of contrast around this defect   Intake/Output from previous day: 07/23 0701 - 07/24 0700 In: 2704 [I.V.:1984; IV Piggyback:720] Out: 830 [Urine:625; Drains:155; Blood:50] Intake/Output this  shift: No intake/output data recorded.  General appearance: sedated on the vent, still unresponsive.  Neck: C collar in place. Resp: clear to auscultation bilaterally and anterior, on the VENT GI: Still distended, dressing in place, Drainage is serosanguinous from the JP.  No BS.  Lab Results:   Recent Labs  08/04/16 1557 08/04/16 1600 08/05/16 0247  WBC 17.6*  --  13.8*  HGB 15.7 17.0 13.2  HCT 45.1 50.0 38.2*  PLT 378  --  281    BMET  Recent Labs  08/04/16 1600 08/05/16 0247  NA 142 137  K 3.3* 4.2  CL 101 112*  CO2  --  22  GLUCOSE 151* 148*  BUN 6 9  CREATININE 0.70 0.82  CALCIUM  --  8.2*   PT/INR No results for input(s): LABPROT, INR in the last 72 hours.  No results for input(s): AST, ALT, ALKPHOS, BILITOT, PROT, ALBUMIN in the last 168 hours.   Lipase  No results found for: LIPASE   Medications: . chlorhexidine gluconate (MEDLINE KIT)  15 mL Mouth Rinse BID  . folic acid  1 mg Intravenous Daily  . heparin  5,000 Units Subcutaneous Q8H  . mouth rinse  15 mL Mouth Rinse 10 times per day  . pantoprazole (PROTONIX) IV  40 mg Intravenous QHS  . thiamine  100 mg Intravenous Daily   . sodium chloride 125 mL/hr at 08/05/16 0600  . sodium chloride    . anidulafungin    . norepinephrine (LEVOPHED) Adult infusion 5.013 mcg/min (08/05/16 0600)  .   piperacillin-tazobactam    . piperacillin-tazobactam (ZOSYN)  IV Stopped (08/05/16 0654)  . propofol (DIPRIVAN) infusion 20 mcg/kg/min (08/05/16 0600)   Anti-infectives    Start     Dose/Rate Route Frequency Ordered Stop   08/05/16 1730  anidulafungin (ERAXIS) 100 mg in sodium chloride 0.9 % 100 mL IVPB     100 mg 78 mL/hr over 100 Minutes Intravenous Every 24 hours 08/04/16 1657     08/05/16 0000  piperacillin-tazobactam (ZOSYN) IVPB 3.375 g     3.375 g 12.5 mL/hr over 240 Minutes Intravenous Every 8 hours 08/04/16 1717     08/04/16 1800  piperacillin-tazobactam (ZOSYN) IVPB 3.375 g  Status:  Discontinued      3.375 g 100 mL/hr over 30 Minutes Intravenous To Surgery 08/04/16 1754 08/04/16 1848   08/04/16 1730  anidulafungin (ERAXIS) 200 mg in sodium chloride 0.9 % 200 mL IVPB     200 mg 78 mL/hr over 200 Minutes Intravenous  Once 08/04/16 1657 08/05/16 0222   08/04/16 1700  piperacillin-tazobactam (ZOSYN) IVPB 3.375 g     3.375 g 100 mL/hr over 30 Minutes Intravenous  Once 08/04/16 1655        Assessment/Plan Pneumoperitoneum/Perforated stomach S/p Exploratory laparotomy with Omental patch of the stomach, 08/04/16, Dr. Burke Thompson Shock/Sepsis Syncope - ? seizure Cardiac arrest with shock x 1/AED/CPR/pulse with arrival of EMS/Prior MI per EKG - elevated troponin  Acute respiratory failure - intubated Unresponsive/encephalopathic T8  Vertebral nondisplaced fracture ? Acute C collar Hypomagnesemia - starting bolus and we recheck labs this AM now and see where we are. FEN:  IV fluids/NPO/OG ID:  Zosyn 08/04/16 - Eraxis(anidulafungin) 08/04/16 =>> day 2 DVT:  Heparin   Plan:  Recheck labs, I will start some Magnesium, continue the OG as long as possible.  Check H pylori, prealbumin.  He does not have a central line at this point.  Increase PPI to q12h, Dressing change tomorrow.      LOS: 1 day    [name],[name] 08/05/2016 [phone]  

## 2016-08-05 NOTE — Progress Notes (Addendum)
Initial Nutrition Assessment  DOCUMENTATION CODES:   Not applicable  INTERVENTION:    If TF started, rec Vital AF 1.2 at goal rate of 50 ml/h (1200 ml per day) and Prostat 30 ml BID  TF regimen + Propofol to provide 1878 kcals, 120 gm protein, 973 ml free water daily  NUTRITION DIAGNOSIS:   Inadequate oral intake related to inability to eat as evidenced by NPO status  GOAL:   Patient will meet greater than or equal to 90% of their needs  MONITOR:   Vent status, Labs, Weight trends, Skin, I & O's  REASON FOR ASSESSMENT:   Ventilator  ASSESSMENT:   66 yo Male with unknown PMH who arrived by [facility] after witnessed collapse. Bystander CPR for 2-3 mins.  AED was placed and shocked once.  On EMS arrival, patient was in [place] 120 with pulses.  On arrival to ER, patient was unresponsive with GCS of 3 and agonal and therefore intubated for airway protection.  Patient is currently intubated on ventilator support MV: 8.8 L/min Temp (24hrs), Avg:98.4 F (36.9 C), Min:94.7 F (34.8 C), Max:100.8 F (38.2 C)  OGT in place Propofol: 9 ml/hr >> 238 fat kcals   Pt collapsed at [facility] and suffered VF/VT arrest. CXR showed massive pneumoperitoneum. S/p exp lap and repair of perforated stomach with omental patch. Medications reviewed and include folic acid and thiamine. Labs reviewed. Mg 1.1 (L). Phos 1.3 (L).  CBG's 142-106.  Unable to complete Nutrition-Focused physical exam at this time.   Diet Order:  Diet NPO time specified  Skin:  Reviewed, no issues  Last BM:  PTA  Height:   Ht Readings from Last 1 Encounters:  08/04/16 5\' 8"  (1.727 m)   Weight:   Wt Readings from Last 1 Encounters:  08/04/16 165 lb 5.5 oz (75 kg)   Ideal Body Weight:  70 kg  BMI:  Body mass index is 25.14 kg/m.  Estimated Nutritional Needs:   Kcal:  1879  Protein:  115-130 gm  Fluid:  per MD  EDUCATION NEEDS:   No education needs identified at this time  Arthur Holms,  RD, LDN Pager #: [phone] After-Hours Pager #: [phone]

## 2016-08-05 NOTE — Procedures (Signed)
Central Venous Catheter Insertion Procedure Note [name] [name] [id number] 09-Aug-1950  Procedure: Insertion of Central Venous Catheter Indications: Assessment of intravascular volume, Drug and/or fluid administration and Frequent blood sampling  Procedure Details Consent: Risks of procedure as well as the alternatives and risks of each were explained to the (patient/caregiver).  Consent for procedure obtained. Time Out: Verified patient identification, verified procedure, site/side was marked, verified correct patient position, special equipment/implants available, medications/allergies/relevent history reviewed, required imaging and test results available.  Performed Anterior C-collar removed with manual cervical stabilization during procedure. Maximum sterile technique was used including antiseptics, cap, gloves, gown, hand hygiene, mask and sheet. Skin prep: Chlorhexidine; local anesthetic administered 4 ml Lidocaine 1% A antimicrobial bonded/coated triple lumen catheter was placed in the left internal jugular vein using the Seldinger technique sutured at 20 cm.  Biopatch and sterile dressing applied.   Evaluation Blood flow good Complications: No apparent complications Patient did tolerate procedure well. Chest X-ray ordered to verify placement.  CXR: pending.  Procedure performed under ultrasound guidance for real time vessel cannulation.     Kennieth Rad, AGACNP-BC [facility] Pgr: [phone] or if no answer [phone] 08/05/2016, 5:19 PM  Lost acces [place] Lavon Paganini. Titus Mould, MD, [place] Pgr: [facility]

## 2016-08-06 ENCOUNTER — Other Ambulatory Visit (HOSPITAL_COMMUNITY): Payer: Self-pay

## 2016-08-06 ENCOUNTER — Inpatient Hospital Stay (HOSPITAL_COMMUNITY): Payer: PPO

## 2016-08-06 ENCOUNTER — Other Ambulatory Visit: Payer: Self-pay

## 2016-08-06 DIAGNOSIS — I469 Cardiac arrest, cause unspecified: Secondary | ICD-10-CM

## 2016-08-06 DIAGNOSIS — J9600 Acute respiratory failure, unspecified whether with hypoxia or hypercapnia: Secondary | ICD-10-CM

## 2016-08-06 LAB — ECHOCARDIOGRAM COMPLETE
EERAT: 8.86
FS: 24 % — AB (ref 28–44)
HEIGHTINCHES: 68 in
IVS/LV PW RATIO, ED: 1.13
LA diam end sys: 30 mm
LASIZE: 30 mm
LAVOL: 47.1 mL
LAVOLA4C: 39.1 mL
LDCA: 3.46 cm2
LV E/e'average: 8.86
LV PW d: 10.2 mm — AB (ref 0.6–1.1)
LV TDI E'MEDIAL: 7.18
LVEEMED: 8.86
LVOT VTI: 17.7 cm
LVOT peak vel: 94.8 cm/s
LVOTD: 21 mm
LVOTSV: 61 mL
MV pk A vel: 81.9 m/s
MV pk E vel: 63.6 m/s
Weight: 2179.91 oz

## 2016-08-06 LAB — COMPREHENSIVE METABOLIC PANEL
ALT: 19 U/L (ref 17–63)
AST: 56 U/L — AB (ref 15–41)
Albumin: 2.6 g/dL — ABNORMAL LOW (ref 3.5–5.0)
Alkaline Phosphatase: 24 U/L — ABNORMAL LOW (ref 38–126)
Anion gap: 5 (ref 5–15)
BUN: 11 mg/dL (ref 6–20)
CHLORIDE: 109 mmol/L (ref 101–111)
CO2: 22 mmol/L (ref 22–32)
CREATININE: 0.73 mg/dL (ref 0.61–1.24)
Calcium: 7.9 mg/dL — ABNORMAL LOW (ref 8.9–10.3)
Glucose, Bld: 112 mg/dL — ABNORMAL HIGH (ref 65–99)
POTASSIUM: 3.7 mmol/L (ref 3.5–5.1)
SODIUM: 136 mmol/L (ref 135–145)
Total Bilirubin: 1.3 mg/dL — ABNORMAL HIGH (ref 0.3–1.2)
Total Protein: 4.5 g/dL — ABNORMAL LOW (ref 6.5–8.1)

## 2016-08-06 LAB — LACTIC ACID, PLASMA: Lactic Acid, Venous: 0.8 mmol/L (ref 0.5–1.9)

## 2016-08-06 LAB — CBC
HCT: 34.3 % — ABNORMAL LOW (ref 39.0–52.0)
Hemoglobin: 11.7 g/dL — ABNORMAL LOW (ref 13.0–17.0)
MCH: 34.1 pg — ABNORMAL HIGH (ref 26.0–34.0)
MCHC: 34.1 g/dL (ref 30.0–36.0)
MCV: 100 fL (ref 78.0–100.0)
PLATELETS: 217 10*3/uL (ref 150–400)
RBC: 3.43 MIL/uL — AB (ref 4.22–5.81)
RDW: 14.5 % (ref 11.5–15.5)
WBC: 10.8 10*3/uL — AB (ref 4.0–10.5)

## 2016-08-06 LAB — PHOSPHORUS: PHOSPHORUS: 2.8 mg/dL (ref 2.5–4.6)

## 2016-08-06 LAB — H. PYLORI ANTIBODY, IGG

## 2016-08-06 LAB — MAGNESIUM: MAGNESIUM: 2.4 mg/dL (ref 1.7–2.4)

## 2016-08-06 LAB — PROCALCITONIN: Procalcitonin: 9.54 ng/mL

## 2016-08-06 MED ORDER — SODIUM CHLORIDE 0.9 % IV SOLN
3.0000 g | Freq: Four times a day (QID) | INTRAVENOUS | Status: DC
  Administered 2016-08-06 – 2016-08-14 (×31): 3 g via INTRAVENOUS
  Filled 2016-08-06 (×36): qty 3

## 2016-08-06 MED ORDER — PERFLUTREN LIPID MICROSPHERE
1.0000 mL | INTRAVENOUS | Status: AC | PRN
  Administered 2016-08-06: 4 mL via INTRAVENOUS
  Filled 2016-08-06: qty 10

## 2016-08-06 MED ORDER — HEPARIN (PORCINE) IN NACL 100-0.45 UNIT/ML-% IJ SOLN
INTRAMUSCULAR | Status: AC
  Filled 2016-08-06: qty 250

## 2016-08-06 MED ORDER — POTASSIUM CHLORIDE 10 MEQ/50ML IV SOLN
10.0000 meq | INTRAVENOUS | Status: AC
  Administered 2016-08-06 (×2): 10 meq via INTRAVENOUS
  Filled 2016-08-06 (×3): qty 50

## 2016-08-06 MED ORDER — HEPARIN (PORCINE) IN NACL 100-0.45 UNIT/ML-% IJ SOLN
1000.0000 [IU]/h | INTRAMUSCULAR | Status: DC
  Administered 2016-08-06: 1150 [IU]/h via INTRAVENOUS
  Filled 2016-08-06 (×3): qty 250

## 2016-08-06 MED ORDER — FLUCONAZOLE IN SODIUM CHLORIDE 400-0.9 MG/200ML-% IV SOLN
400.0000 mg | INTRAVENOUS | Status: AC
  Administered 2016-08-06 – 2016-08-10 (×5): 400 mg via INTRAVENOUS
  Filled 2016-08-06 (×5): qty 200

## 2016-08-06 MED ORDER — PROPOFOL 1000 MG/100ML IV EMUL: 0.0000 ug/kg/min | INTRAVENOUS | Status: DC

## 2016-08-06 NOTE — Progress Notes (Signed)
  Echocardiogram 2D Echocardiogram has been performed.  [name] [name] 08/06/2016, 5:38 PM

## 2016-08-06 NOTE — Procedures (Signed)
Extubation Procedure Note  Patient Details:   Name: [name] [name] DOB: Dec 07, 1950 MRN: [id number]   Airway Documentation:     Evaluation  O2 sats: stable throughout Complications: No apparent complications Patient did tolerate procedure well. Bilateral Breath Sounds: Rhonchi   Yes  Patient extubated to 4LNC, positive cuff leaked noted, no evidence of stridor, vital signs stable, patient able to speak and follow commands, RT will continue to monitor.   Ichelle Harral N Dymphna Wadley 08/06/2016, 10:23 AM

## 2016-08-06 NOTE — Progress Notes (Signed)
[facility] Progress Note Patient Name: [name] [name] DOB: June 18, 1950 MRN: [id number]   Date of Service  08/06/2016  HPI/Events of Note  Called and notified that patient has been progressively tachycardic, currently in 120's and regular. TTE reviewed, shows an LV apical thrombus.   eICU Interventions  ECG now to confirm sinus tachy Will start heparin without a bolus.  RN called Dr Kae Heller with [facility] for me to confirm that heparin acceptable post-op.      Intervention Category Major Interventions: Other:  [name] Bourn S. 08/06/2016, 6:43 PM

## 2016-08-06 NOTE — Progress Notes (Addendum)
ANTICOAGULATION CONSULT NOTE - Initial Consult  Pharmacy Consult for heparin Indication: LV Thrombus  No Known Allergies  Patient Measurements: Height: 5\' 8"  (172.7 cm) Weight: 136 lb 3.9 oz (61.8 kg) IBW/kg (Calculated) : 68.4 Heparin Dosing Weight: 61.8 kg  Vital Signs: Temp: 98.1 F (36.7 C) (07/25 1600) Temp Source: Oral (07/25 1600) BP: 125/96 (07/25 1800) Pulse Rate: 106 (07/25 1800)  Labs:  Recent Labs  08/04/16 2150 08/05/16 0247 08/05/16 0930 08/06/16 0408  HGB  --  13.2 13.4 11.7*  HCT  --  38.2* 38.8* 34.3*  PLT  --  281 261 217  CREATININE  --  0.82 0.81 0.73  TROPONINI 1.01* 1.03*  --   --     Estimated Creatinine Clearance: 78.3 mL/min (by C-G formula based on SCr of 0.73 mg/dL).  Assessment: 33 yoM s/p exploratory laparotomy with omental patch of stomach on 7/23 and echo showing LV thrombus. Pharmacy consulted to start heparin without bolus. Hgb down 13.4>11.7, pltc down 261>217 --> ?hemodilution given IV meds. Per nurse, patient has JP drain with minimal output (80 mL total today). Heparin ok'd with surgeon. Nurse states there is no bleeding and suture sites look okay.  Goal of Therapy:  Heparin level 0.3-0.7 units/ml Monitor platelets by anticoagulation protocol: Yes   Plan:  Start heparin 1150 units/hr 6 hour heparin level Monitor daily heparin level and CBC Monitor signs and symptoms of bleeding F/u long-term anticoagulation plans  Dierdre Harness, PharmD Clinical Pharmacist [phone] (Pager) 08/06/2016 7:07 PM

## 2016-08-06 NOTE — Progress Notes (Signed)
2 Days Post-Op   Subjective/Chief Complaint: No acute events. Intermittently following commands per bedside RN. Off pressors since yesterday.   Objective: Vital signs in last 24 hours: Temp:  [96.9 F (36.1 C)-100 F (37.8 C)] 98.8 F (37.1 C) (07/25 0407) Pulse Rate:  [76-127] 91 (07/25 0600) Resp:  [10-21] 13 (07/25 0600) BP: (78-153)/(53-96) 113/80 (07/25 0600) SpO2:  [99 %-100 %] 100 % (07/25 0600) Arterial Line BP: (74-137)/(54-126) 126/73 (07/25 0600) FiO2 (%):  [40 %] 40 % (07/25 0306) Weight:  [61.8 kg (136 lb 3.9 oz)] 61.8 kg (136 lb 3.9 oz) (07/25 0600) Last BM Date:  (PTA)  Intake/Output from previous day: 07/24 0701 - 07/25 0700 In: 3005.1 [I.V.:2305.1; NG/GT:250; IV Piggyback:450] Out: 1562 [Urine:797; Emesis/NG output:650; Drains:115] Intake/Output this shift: No intake/output data recorded.  General appearance: Eyes open, purposeful movement Resp: clear to auscultation bilaterally GI: soft, nondistended, appropriately tender. JP output serosanguinous Skin: Skin color, texture, turgor normal. No rashes or lesions Incision/Wound: midline wound clean and healthy without abnormal drainage  Lab Results:   Recent Labs  08/05/16 0930 08/06/16 0408  WBC 16.2* 10.8*  HGB 13.4 11.7*  HCT 38.8* 34.3*  PLT 261 217   BMET  Recent Labs  08/05/16 0930 08/06/16 0408  NA 138 136  K 4.2 3.7  CL 110 109  CO2 21* 22  GLUCOSE 129* 112*  BUN 12 11  CREATININE 0.81 0.73  CALCIUM 8.0* 7.9*   PT/INR No results for input(s): LABPROT, INR in the last 72 hours. ABG  Recent Labs  08/05/16 0504 08/05/16 0513  PHART 7.346* 7.405  HCO3 10.9* 20.5    Studies/Results: Ct Abdomen Pelvis Wo Contrast  Addendum Date: 08/04/2016   ADDENDUM REPORT: 08/04/2016 17:57 ADDENDUM: Imaging findings and results were discussed with Dr. Georganna Skeans upon completion of the study by Dr. Laurence Ferrari. Electronically Signed   By: Donavan Foil M.D.   On: 08/04/2016 17:57    Result Date: 08/04/2016 CLINICAL DATA:  Collapsed in [employer], post CPR EXAM: CT CHEST, ABDOMEN AND PELVIS WITHOUT CONTRAST TECHNIQUE: Multidetector CT imaging of the chest, abdomen and pelvis was performed following the standard protocol without IV contrast. COMPARISON:  Radiograph 08/04/2016 FINDINGS: CT CHEST FINDINGS Cardiovascular: Limited evaluation without intravenous contrast. Aortic atherosclerosis. Coronary artery calcification. 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 Small amount of pericardial effusion. Mild cardiomegaly. Mediastinum/Nodes: Endotracheal tube tip terminates several cm above the carina. No thyroid mass. Esophageal tube in the esophagus with the tip terminating near the gastric outlet. Nonspecific subcentimeter mediastinal lymph nodes. Lungs/Pleura: No pneumothorax. Mild emphysema. Mild bronchiectasis within the right greater than left upper lobes. No focal consolidation or pleural effusion. Musculoskeletal: Motion artifact causes false appearance of inferior sternal fracture. Irregular linear lucency through the anterior aspect of T8 vertebral body, without posterior extension, this may extend to the inferior endplate at T8. Degenerative changes. CT ABDOMEN PELVIS FINDINGS Hepatobiliary: No focal liver abnormality is seen. No gallstones, gallbladder wall thickening, or biliary dilatation. Pancreas: Unremarkable. No pancreatic ductal dilatation or surrounding inflammatory changes. Spleen: Normal in size without focal abnormality. Adrenals/Urinary Tract: Adrenal glands are unremarkable. Kidneys are normal, without renal calculi, focal lesion, or hydronephrosis. Bladder is unremarkable. Stomach/Bowel: Esophageal tube tip in the stomach. Contrast material present within the stomach and proximal small bowel. Small focal defect within the anterior aspect of the stomach/lesser curvature, series 3, image number 56 with leakage of contrast around this  defect. No significant bowel wall thickening. Vascular/Lymphatic: Aortic atherosclerosis. Non  aneurysmal aorta. Scattered subcentimeter mesenteric lymph nodes. Reproductive: Prominent prostate gland. Other: Massive pneumoperitoneum. Free intraperitoneal contrast within the left abdomen. Musculoskeletal: Degenerative changes. No acute or suspicious bone lesion. IMPRESSION: 1. Massive pneumoperitoneum with free extravasation of contrast in the left hemiabdomen, consistent with viscus perforation. Apparent source is a defect within the anterior aspect/lesser curvature of the stomach. 2. Linear lucency within the T8 vertebral body is suspicious for nondisplaced fracture, likely acute. 3. Mild emphysema Critical Value/emergent results were discussed with the surgeon caring for the patient by Dr. Laurence Ferrari at the time the scan was completed. A call was placed to the emergency department at 5:46 p.m., patient is reportedly in the OR. Electronically Signed: By: Donavan Foil M.D. On: 08/04/2016 17:47   Ct Head Wo Contrast  Result Date: 08/04/2016 CLINICAL DATA:  66 year old male status post cardiac arrest and collapse at [employer] earlier today. Patient is status post CPR. EXAM: CT HEAD WITHOUT CONTRAST CT CERVICAL SPINE WITHOUT CONTRAST TECHNIQUE: Multidetector CT imaging of the head and cervical spine was performed following the standard protocol without intravenous contrast. Multiplanar CT image reconstructions of the cervical spine were also generated. COMPARISON:  None. FINDINGS: CT HEAD FINDINGS Brain: Negative for acute intracranial hemorrhage, acute infarction, mass, mass effect, hydrocephalus or midline shift. Gray-white differentiation is preserved throughout. Mild cerebral cortical atrophy. Small bilateral remote cerebellar lacunar infarcts. Vascular: No hyperdense vessel or unexpected calcification. Skull: Normal. Negative for fracture or focal lesion. Sinuses/Orbits: No acute finding. Other: None. CT  CERVICAL SPINE FINDINGS Alignment: Normal. Skull base and vertebrae: No acute cervical spine fracture. There is a nondisplaced fracture through the posterior aspect of the left first rib. No primary bone lesion or focal pathologic process. Soft tissues and spinal canal: No prevertebral fluid or swelling. No visible canal hematoma. Disc levels: Multilevel cervical spondylosis. Degenerative changes are most notable at C4-C5, C5-C6 and C6-C7. Right-sided facet arthropathy at C3-C4. Left-sided facet arthropathy at C2-C3, C3-C4, C4-C5 and C7-T1. Upper chest: Negative. Other: The patient is intubated. A gastric tube is also present in the esophagus. IMPRESSION: CT HEAD 1. No acute intracranial abnormality. 2. Cerebral cortical atrophy. 3. Remote right cerebellar lacunar infarct. CT CSPINE 1. No evidence of acute cervical fracture or malalignment. 2. Nondisplaced left first rib fracture. 3. Multilevel cervical spondylosis and left greater than right facet arthropathy. Electronically Signed   By: Jacqulynn Cadet M.D.   On: 08/04/2016 17:33   Ct Chest Wo Contrast  Addendum Date: 08/04/2016   ADDENDUM REPORT: 08/04/2016 17:57 ADDENDUM: Imaging findings and results were discussed with Dr. Georganna Skeans upon completion of the study by Dr. Laurence Ferrari. Electronically Signed   By: Donavan Foil M.D.   On: 08/04/2016 17:57   Result Date: 08/04/2016 CLINICAL DATA:  Collapsed in [employer], post CPR EXAM: CT CHEST, ABDOMEN AND PELVIS WITHOUT CONTRAST TECHNIQUE: Multidetector CT imaging of the chest, abdomen and pelvis was performed following the standard protocol without IV contrast. COMPARISON:  Radiograph 08/04/2016 FINDINGS: CT CHEST FINDINGS Cardiovascular: Limited evaluation without intravenous contrast. Aortic atherosclerosis. Coronary artery calcification. 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 Small amount of pericardial effusion. Mild cardiomegaly. Mediastinum/Nodes:  Endotracheal tube tip terminates several cm above the carina. No thyroid mass. Esophageal tube in the esophagus with the tip terminating near the gastric outlet. Nonspecific subcentimeter mediastinal lymph nodes. Lungs/Pleura: No pneumothorax. Mild emphysema. Mild bronchiectasis within the right greater than left upper lobes. No focal consolidation or pleural effusion. Musculoskeletal: Motion artifact causes false appearance of inferior  sternal fracture. Irregular linear lucency through the anterior aspect of T8 vertebral body, without posterior extension, this may extend to the inferior endplate at T8. Degenerative changes. CT ABDOMEN PELVIS FINDINGS Hepatobiliary: No focal liver abnormality is seen. No gallstones, gallbladder wall thickening, or biliary dilatation. Pancreas: Unremarkable. No pancreatic ductal dilatation or surrounding inflammatory changes. Spleen: Normal in size without focal abnormality. Adrenals/Urinary Tract: Adrenal glands are unremarkable. Kidneys are normal, without renal calculi, focal lesion, or hydronephrosis. Bladder is unremarkable. Stomach/Bowel: Esophageal tube tip in the stomach. Contrast material present within the stomach and proximal small bowel. Small focal defect within the anterior aspect of the stomach/lesser curvature, series 3, image number 56 with leakage of contrast around this defect. No significant bowel wall thickening. Vascular/Lymphatic: Aortic atherosclerosis. Non aneurysmal aorta. Scattered subcentimeter mesenteric lymph nodes. Reproductive: Prominent prostate gland. Other: Massive pneumoperitoneum. Free intraperitoneal contrast within the left abdomen. Musculoskeletal: Degenerative changes. No acute or suspicious bone lesion. IMPRESSION: 1. Massive pneumoperitoneum with free extravasation of contrast in the left hemiabdomen, consistent with viscus perforation. Apparent source is a defect within the anterior aspect/lesser curvature of the stomach. 2. Linear lucency  within the T8 vertebral body is suspicious for nondisplaced fracture, likely acute. 3. Mild emphysema Critical Value/emergent results were discussed with the surgeon caring for the patient by Dr. Laurence Ferrari at the time the scan was completed. A call was placed to the emergency department at 5:46 p.m., patient is reportedly in the OR. Electronically Signed: By: Donavan Foil M.D. On: 08/04/2016 17:47   Ct Cervical Spine Wo Contrast  Result Date: 08/04/2016 CLINICAL DATA:  66 year old male status post cardiac arrest and collapse at [employer] earlier today. Patient is status post CPR. EXAM: CT HEAD WITHOUT CONTRAST CT CERVICAL SPINE WITHOUT CONTRAST TECHNIQUE: Multidetector CT imaging of the head and cervical spine was performed following the standard protocol without intravenous contrast. Multiplanar CT image reconstructions of the cervical spine were also generated. COMPARISON:  None. FINDINGS: CT HEAD FINDINGS Brain: Negative for acute intracranial hemorrhage, acute infarction, mass, mass effect, hydrocephalus or midline shift. Gray-white differentiation is preserved throughout. Mild cerebral cortical atrophy. Small bilateral remote cerebellar lacunar infarcts. Vascular: No hyperdense vessel or unexpected calcification. Skull: Normal. Negative for fracture or focal lesion. Sinuses/Orbits: No acute finding. Other: None. CT CERVICAL SPINE FINDINGS Alignment: Normal. Skull base and vertebrae: No acute cervical spine fracture. There is a nondisplaced fracture through the posterior aspect of the left first rib. No primary bone lesion or focal pathologic process. Soft tissues and spinal canal: No prevertebral fluid or swelling. No visible canal hematoma. Disc levels: Multilevel cervical spondylosis. Degenerative changes are most notable at C4-C5, C5-C6 and C6-C7. Right-sided facet arthropathy at C3-C4. Left-sided facet arthropathy at C2-C3, C3-C4, C4-C5 and C7-T1. Upper chest: Negative. Other: The patient is intubated.  A gastric tube is also present in the esophagus. IMPRESSION: CT HEAD 1. No acute intracranial abnormality. 2. Cerebral cortical atrophy. 3. Remote right cerebellar lacunar infarct. CT CSPINE 1. No evidence of acute cervical fracture or malalignment. 2. Nondisplaced left first rib fracture. 3. Multilevel cervical spondylosis and left greater than right facet arthropathy. Electronically Signed   By: Jacqulynn Cadet M.D.   On: 08/04/2016 17:33   Dg Chest Port 1 View  Result Date: 08/05/2016 CLINICAL DATA:  Status post central line placement EXAM: PORTABLE CHEST 1 VIEW COMPARISON:  Film from earlier in the same day. FINDINGS: Left jugular central line is now seen with the catheter tip in the mid superior vena cava. No pneumothorax is  noted. Cardiac shadow is stable. Endotracheal tube and nasogastric catheter are stable as well as left upper quadrant surgical drain. Minimal small left pleural effusion is again noted. IMPRESSION: No pneumothorax following central line placement. The remainder the exam is stable. Electronically Signed   By: Inez Catalina M.D.   On: 08/05/2016 18:20   Dg Chest Port 1 View  Result Date: 08/05/2016 CLINICAL DATA:  Respiratory failure A. EXAM: PORTABLE CHEST 1 VIEW COMPARISON:  Chest x-ray and chest CT scan of August 04, 2016 FINDINGS: The lungs are adequately inflated. The retrocardiac lung markings are coarse. There is a trace of pleural fluid on the left. There is no mediastinal shift. The heart and pulmonary vascularity are normal. The endotracheal tube tip lies 5.2 cm above the carina. The esophagogastric tube tip and proximal port project below the inferior margin of the image a left internal jugular venous catheter has its tip projecting at the junction of the internal jugular vein with the left subclavian vein. There is a drainage catheter in place that projects just inferior to the left hemidiaphragm. There are old right lateral rib fractures. IMPRESSION: No pneumoperitoneum is  observed today. No pneumothorax or pneumomediastinum is observed. There is probable subsegmental atelectasis in the left lower lobe. The support tubes are in reasonable position. Electronically Signed   By: David  Martinique M.D.   On: 08/05/2016 07:32   Dg Chest Portable 1 View  Result Date: 08/04/2016 CLINICAL DATA:  Cardiorespiratory arrest. Cardiopulmonary resuscitation. EXAM: PORTABLE CHEST 1 VIEW COMPARISON:  None. FINDINGS: Massive pneumoperitoneum. Endotracheal tube tip in satisfactory position approximately 5 cm above the carina. Nasogastric tube courses below the diaphragm into the stomach. Cardiac silhouette moderately enlarged. Lungs clear. Bronchovascular markings normal. Pulmonary vascularity normal. No visible pleural effusions. No pneumothorax. IMPRESSION: 1. Massive pneumoperitoneum. 2.  Support apparatus satisfactory. 3. Moderate cardiomegaly.  No acute cardiopulmonary disease. I personally telephoned these emergent results at the time of interpretation on 08/04/2016 at 4:15 pm to Dr. Orlie Dakin, who verbally acknowledged these results. Electronically Signed   By: Evangeline Dakin M.D.   On: 08/04/2016 16:18    Anti-infectives: Anti-infectives    Start     Dose/Rate Route Frequency Ordered Stop   08/05/16 1730  anidulafungin (ERAXIS) 100 mg in sodium chloride 0.9 % 100 mL IVPB     100 mg 78 mL/hr over 100 Minutes Intravenous Every 24 hours 08/04/16 1657     08/05/16 0000  piperacillin-tazobactam (ZOSYN) IVPB 3.375 g     3.375 g 12.5 mL/hr over 240 Minutes Intravenous Every 8 hours 08/04/16 1717     08/04/16 1800  piperacillin-tazobactam (ZOSYN) IVPB 3.375 g  Status:  Discontinued     3.375 g 100 mL/hr over 30 Minutes Intravenous To Surgery 08/04/16 1754 08/04/16 1848   08/04/16 1730  anidulafungin (ERAXIS) 200 mg in sodium chloride 0.9 % 200 mL IVPB     200 mg 78 mL/hr over 200 Minutes Intravenous  Once 08/04/16 1657 08/05/16 0222   08/04/16 1700  piperacillin-tazobactam  (ZOSYN) IVPB 3.375 g     3.375 g 100 mL/hr over 30 Minutes Intravenous  Once 08/04/16 1655        Assessment/Plan: s/p Procedure(s): EXPLORATORY LAPAROTOMY FREE AIR BIOPSY OF PERFORATED STOMACH ULCER REPAIR OF PERFORATED STOMACH ULCER WITH A OMENTAL PATCH (N/A) Continue gastric decompression and strict NPO. Continue IV PPI and antibiotics. if extubated and OG removed, prefer that enteric tube is NOT replaced.   LOS: 2 days    Kieran Nachtigal A  Kae Heller 08/06/2016

## 2016-08-06 NOTE — Progress Notes (Signed)
PULMONARY / CRITICAL CARE MEDICINE   Name: [name] [name] MRN: [id number] DOB: [birth year]    ADMISSION DATE:  08/04/2016 CONSULTATION DATE:  08/04/2016  REFERRING MD:  Dr. Winfred Leeds  CHIEF COMPLAINT:  Cardiac Arrest  HISTORY OF PRESENT ILLNESS:  Patient is encephalopathic; therefore HPI obtained from chart review- no prior hx in EMR.  No family available.  66 year old male with unknown past medical history who arrived by [facility] after witnessed collapse in [place].  Bystander CPR for 2-3 mins.  AED was placed and shocked once.  On EMS arrival, patient was in [place] 120 with pulses.  On arrival to ER, patient was unresponsive with GCS of 3 and agonal and therefore intubated for airway protection.  Initial labs with WBC 17k, glucose 142, K 3.3.  Cardiology consulted for concern of ACS with EKG showing prior anterior and inferior infarcts.   Abd noted to be firm and distended.  CXR shows massive pneumoperitoneum.  Surgery consulted and at bedside.  PCCM to admit.    Addendum: patient found to have additional chart on MRN [id number] with chart up for merger.  PMH of HTN, CAD, HLD, tobacco abuse, elevated LFTs, and BPH and NKDA.    SUBJECTIVE:  Follows commands weaning  VITAL SIGNS: BP 100/75 (BP Location: Right Arm)   Pulse 83   Temp (!) 96.4 F (35.8 C)   Resp 12   Ht 5\' 8"  (1.727 m)   Wt 61.8 kg (136 lb 3.9 oz)   SpO2 100%   BMI 20.72 kg/m   HEMODYNAMICS:    VENTILATOR SETTINGS: Vent Mode: PSV;CPAP FiO2 (%):  [40 %] 40 % Set Rate:  [12 bmp] 12 bmp Vt Set:  [650 mL] 650 mL PEEP:  [5 cmH20] 5 cmH20 Pressure Support:  [10 cmH20] 10 cmH20 Plateau Pressure:  [8 cmH20-19 cmH20] 8 cmH20  INTAKE / OUTPUT: I/O last 3 completed shifts: In: 5147.3 [I.V.:4027.3; NG/GT:250; IV Piggyback:870] Out: 2007 [Urine:1177; Emesis/NG output:650; Drains:180]  PHYSICAL EXAMINATION: General: awakens, on wean Neuro: alert fc squeezing hands, NO neck pain on palpation HEENT: line  clean, ett, collar PULM: coarse CV: s1 s2 rrr GI: soft, no bs, closed, dressing dry  Extremities: no edema  LABS:  BMET  Recent Labs Lab 08/05/16 0247 08/05/16 0930 08/06/16 0408  NA 137 138 136  K 4.2 4.2 3.7  CL 112* 110 109  CO2 22 21* 22  BUN 9 12 11   CREATININE 0.82 0.81 0.73  GLUCOSE 148* 129* 112*    Electrolytes  Recent Labs Lab 08/05/16 0247 08/05/16 0930 08/05/16 1539 08/06/16 0408  CALCIUM 8.2* 8.0*  --  7.9*  MG 1.1* 1.2* 2.0 2.4  PHOS 1.3*  --   --  2.8    CBC  Recent Labs Lab 08/05/16 0247 08/05/16 0930 08/06/16 0408  WBC 13.8* 16.2* 10.8*  HGB 13.2 13.4 11.7*  HCT 38.2* 38.8* 34.3*  PLT 281 261 217    Coag's No results for input(s): APTT, INR in the last 168 hours.  Sepsis Markers  Recent Labs Lab 08/04/16 1902 08/04/16 2142 08/05/16 0247 08/06/16 0408  LATICACIDVEN 1.4 1.6  --  0.8  PROCALCITON 0.25  --  8.79 9.54    ABG  Recent Labs Lab 08/04/16 1641 08/05/16 0504 08/05/16 0513  PHART 7.370 7.346* 7.405  PCO2ART 47.1 20.0* 32.8  PO2ART 512.0* 126.0* 130.0*    Liver Enzymes  Recent Labs Lab 08/05/16 0930 08/06/16 0408  AST 56* 56*  ALT 19 19  ALKPHOS  30* 24*  BILITOT 1.5* 1.3*  ALBUMIN 3.0* 2.6*    Cardiac Enzymes  Recent Labs Lab 08/04/16 2150 08/05/16 0247  TROPONINI 1.01* 1.03*    Glucose  Recent Labs Lab 08/04/16 1546 08/05/16 1147  GLUCAP 142* 106*    Imaging Dg Chest Port 1 View  Result Date: 08/06/2016 CLINICAL DATA:  Respiratory failure.  Endotracheal tube present. EXAM: PORTABLE CHEST 1 VIEW COMPARISON:  08/05/2016; 08/04/2016 FINDINGS: Grossly unchanged cardiac silhouette and mediastinal contours. Stable position of support apparatus. Left basilar consolidative opacities and potential trace left-sided effusion are unchanged. No new focal airspace opacities. No evidence of edema. No pneumothorax. No acute osseus abnormalities. Old right-sided rib fractures. IMPRESSION: 1.  Stable  positioning of support apparatus.  No pneumothorax. 2. Unchanged small left-sided effusion and left basilar opacities, atelectasis versus infiltrate. Electronically Signed   By: Sandi Mariscal M.D.   On: 08/06/2016 07:41   Dg Chest Port 1 View  Result Date: 08/05/2016 CLINICAL DATA:  Status post central line placement EXAM: PORTABLE CHEST 1 VIEW COMPARISON:  Film from earlier in the same day. FINDINGS: Left jugular central line is now seen with the catheter tip in the mid superior vena cava. No pneumothorax is noted. Cardiac shadow is stable. Endotracheal tube and nasogastric catheter are stable as well as left upper quadrant surgical drain. Minimal small left pleural effusion is again noted. IMPRESSION: No pneumothorax following central line placement. The remainder the exam is stable. Electronically Signed   By: Inez Catalina M.D.   On: 08/05/2016 18:20   STUDIES:  CXR 7/23 > massive pneumoperitoneum. CT chest 7/23 >  CT head 7/23 > No acute intracranial abnormality. Cerebral cortical atrophy. Remote right cerebellar lacunar infarct. CT A / P 7/23 > Massive pneumoperitoneum with free extravasation of contrast in the left hemiabdomen, consistent with viscus perforation. Apparent source is a defect within the anterior aspect/lesser curvature of the stomach, emphysema CXR 7/24>> No pneumoperitoneum  No pneumothorax or pneumomediastinum  probable subsegmental atelectasis in the left lower lobe.   CULTURES: Blood 7/23 >   ANTIBIOTICS: Zosyn 7/23 >>>7/25 unasyn 7/25>>>  Eraxis 7/23 >7/25 dilfucan 7/25>>>   SIGNIFICANT EVENTS: 7/23 > admit VF/ VT arrest 7/25  LINES/TUBES: ETT 7/23 >  OGT 7/23 > Left IJ 7/24>>>  DISCUSSION: 66 y.o. male admitted 7/23 after collapsed at [facility] and suffered VF/VT arrest.  Required 1 shock and 2 - 3 minutes CPR before ROSC.  Brought to ED where he was intubated and incidentally found to have massive pneumoperitoneum.  CCS consulted and planning to take to  OR.  ASSESSMENT / PLAN:  PULMONARY A: Acute hypoxic respiratory failure - s/p intubation in ED 7/23. Tobacco abuse Tolerating CPAP/ PS this am with good volumes  P:   Wean aggressive, cpap 5 p ps 10, reduce PS to 5 now, assess rsbi pcxr in am  Clear neck if able  CARDIOVASCULAR A:  VF/VT arrest - unclear etiology.   Required 1 shock and 2 - 3 minutes CPR before ROSC. 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 Hx HTN, HLD, and CAD Troponin slight up trend over night 1.03 Not cooling candidate as stomach rupture/ contamination of peritoneal cavity/ infx risk P:  Cardiology following, appreciate assist Echo done but pending result No pressors Keep tele Keep mag greater 2, k greater 4 Assess qtc  RENAL A:   Hypokalemia. Hypocalcemia. Hypophos>>  Hypomag>> repleted 2 gms. Consider additional 2 grams Good UO  P:   kvo  fluids  k supp bemt in am   GASTROINTESTINAL A:   Pneumoperitoneum -s/p Exploratory laparotomy with Omental patch of the stomach, 08/04/16, Dr. Lavone Neri Thompson>> JP with sero sang/ bloody drainage GI prophylaxis. Nutrition. P:   CCS consulted, planning to take to OR after CT scans completed. SUP: Pantoprazole drip, dc after 48 hours from start  NPO.  HEMATOLOGIC A:   VTE Prophylaxis. Leukocytosis P:  SCD's / heparin. CBC in AM  INFECTIOUS A:   At risk peritonitis. Leukocytosis>> peritoneal cavity contamination/ perforated stomach  Peritoneal cavity contamination 2/2 stomach rupture Lactic Acid cleared to 1.6 PCt nonspecific P:   Clinically progressing, narrow slight anidulo to diflucan From home  ENDOCRINE A:   No acute issues. P:   CBG Q 4 SSI if glucose consistently > 180.  NEUROLOGIC A:   Acute encephalopathy - exam out of proportion to reported downtime. ? ETOH use P:   Light Sedation:  Propofol gtt / Fentanyl PRN - wua RASS goal: 0 to -1. Daily WUA  If no neck pain to palpation then dc  c-collar Add thiamine and folate  Awake and following commands, tolerating CPAP/ PS. Consider extubation.   Family updated: Wife updated at bedside daily  Interdisciplinary Family Meeting v Palliative Care Meeting:  Due by: 08/11/16.  CC time: 35 min.  Lavon Paganini. Titus Mould, MD, [place] Pgr: [facility] 08/06/2016 9:51 AM

## 2016-08-06 NOTE — Progress Notes (Signed)
RN called both CCM MD Byrum and Surgery MD Kae Heller to inform of the results of the ECHO showing a LV apicalthrombus and that the pt's HR has increased to the 120's-130's. EKG performed confirming sinus tach with PACs. Pt states he is in no pain, but pt remains confused and only oriented to self and intermittently place. Pt does not remember that he had surgery, or why he is in the hospital, and can not remember the year. RN confirmed with MD Kae Heller that starting heparin was ok from a surgical standpoint, and RN relayed this information to MD Byrum. Will continue to monitor.

## 2016-08-07 ENCOUNTER — Encounter (HOSPITAL_COMMUNITY): Payer: Self-pay | Admitting: General Practice

## 2016-08-07 ENCOUNTER — Inpatient Hospital Stay (HOSPITAL_COMMUNITY): Payer: PPO

## 2016-08-07 LAB — CBC
HEMATOCRIT: 32.2 % — AB (ref 39.0–52.0)
HEMOGLOBIN: 11 g/dL — AB (ref 13.0–17.0)
MCH: 34.1 pg — AB (ref 26.0–34.0)
MCHC: 34.2 g/dL (ref 30.0–36.0)
MCV: 99.7 fL (ref 78.0–100.0)
Platelets: 203 10*3/uL (ref 150–400)
RBC: 3.23 MIL/uL — ABNORMAL LOW (ref 4.22–5.81)
RDW: 13.9 % (ref 11.5–15.5)
WBC: 10.3 10*3/uL (ref 4.0–10.5)

## 2016-08-07 LAB — BASIC METABOLIC PANEL
Anion gap: 7 (ref 5–15)
BUN: 10 mg/dL (ref 6–20)
CALCIUM: 7.8 mg/dL — AB (ref 8.9–10.3)
CHLORIDE: 107 mmol/L (ref 101–111)
CO2: 24 mmol/L (ref 22–32)
CREATININE: 0.79 mg/dL (ref 0.61–1.24)
GFR calc Af Amer: >60 mL/min (ref >60)
GFR calc non Af Amer: >60 mL/min (ref >60)
GLUCOSE: 84 mg/dL (ref 65–99)
Potassium: 3.8 mmol/L (ref 3.5–5.1)
Sodium: 138 mmol/L (ref 135–145)

## 2016-08-07 LAB — MAGNESIUM: Magnesium: 1.7 mg/dL (ref 1.7–2.4)

## 2016-08-07 LAB — PHOSPHORUS: Phosphorus: 2.4 mg/dL — ABNORMAL LOW (ref 2.5–4.6)

## 2016-08-07 LAB — HEPARIN LEVEL (UNFRACTIONATED)
HEPARIN UNFRACTIONATED: 0.66 [IU]/mL (ref 0.30–0.70)
HEPARIN UNFRACTIONATED: 0.68 [IU]/mL (ref 0.30–0.70)
Heparin Unfractionated: 0.71 IU/mL — ABNORMAL HIGH (ref 0.30–0.70)

## 2016-08-07 MED ORDER — CHLORHEXIDINE GLUCONATE CLOTH 2 % EX PADS
6.0000 | MEDICATED_PAD | Freq: Every day | CUTANEOUS | Status: DC
  Administered 2016-08-07 – 2016-08-10 (×4): 6 via TOPICAL

## 2016-08-07 MED ORDER — SODIUM CHLORIDE 0.9% FLUSH
10.0000 mL | INTRAVENOUS | Status: DC | PRN
  Administered 2016-08-07: 10 mL
  Filled 2016-08-07: qty 40

## 2016-08-07 MED ORDER — POTASSIUM PHOSPHATES 15 MMOLE/5ML IV SOLN
30.0000 mmol | Freq: Once | INTRAVENOUS | Status: AC
  Administered 2016-08-07: 30 mmol via INTRAVENOUS
  Filled 2016-08-07: qty 10

## 2016-08-07 MED ORDER — SODIUM CHLORIDE 0.9% FLUSH
10.0000 mL | Freq: Two times a day (BID) | INTRAVENOUS | Status: DC
  Administered 2016-08-07 – 2016-08-10 (×4): 10 mL
  Administered 2016-08-12: 3 mL
  Administered 2016-08-12 – 2016-08-14 (×2): 10 mL

## 2016-08-07 MED ORDER — FUROSEMIDE 10 MG/ML IJ SOLN
20.0000 mg | Freq: Two times a day (BID) | INTRAMUSCULAR | Status: DC
  Administered 2016-08-07 – 2016-08-09 (×4): 20 mg via INTRAVENOUS
  Filled 2016-08-07 (×5): qty 2

## 2016-08-07 MED ORDER — MAGNESIUM SULFATE 4 GM/100ML IV SOLN
4.0000 g | Freq: Once | INTRAVENOUS | Status: AC
  Administered 2016-08-07: 4 g via INTRAVENOUS
  Filled 2016-08-07: qty 100

## 2016-08-07 MED ORDER — POTASSIUM CHLORIDE 10 MEQ/50ML IV SOLN
10.0000 meq | Freq: Once | INTRAVENOUS | Status: AC
  Administered 2016-08-07: 10 meq via INTRAVENOUS
  Filled 2016-08-07: qty 50

## 2016-08-07 MED ORDER — PNEUMOCOCCAL VAC POLYVALENT 25 MCG/0.5ML IJ INJ
0.5000 mL | INJECTION | INTRAMUSCULAR | Status: DC
  Filled 2016-08-07 (×2): qty 0.5

## 2016-08-07 NOTE — Progress Notes (Signed)
PULMONARY / CRITICAL CARE MEDICINE   Name: [name] [name] MRN: [id number] DOB: [birth year]    ADMISSION DATE:  08/04/2016 CONSULTATION DATE:  08/04/2016  REFERRING MD:  Dr. Winfred Leeds  CHIEF COMPLAINT:  Cardiac Arrest  HISTORY OF PRESENT ILLNESS:   66 year old male with unknown past medical history who arrived by [facility] after witnessed collapse in [place].  Bystander CPR for 2-3 mins.  AED was placed and shocked once.  On EMS arrival, patient was in [place] 120 with pulses.  On arrival to ER, patient was unresponsive with GCS of 3 and agonal and therefore intubated for airway protection.  Initial labs with WBC 17k, glucose 142, K 3.3.  Cardiology consulted for concern of ACS with EKG showing prior anterior and inferior infarcts.   Abd noted to be firm and distended.  CXR shows massive pneumoperitoneum.  Surgery consulted and at bedside.  PCCM to admit.    Addendum: patient found to have additional chart on MRN [id number] with chart up for merger.  PMH of HTN, CAD, HLD, tobacco abuse, elevated LFTs, and BPH and NKDA.    SUBJECTIVE:  Extubated yesterday.  Echo with LV thrombus, heparin started. Very confused.   VITAL SIGNS: BP 112/73 (BP Location: Right Arm)   Pulse 93   Temp 98.7 F (37.1 C) (Oral)   Resp 18   Ht 5\' 8"  (1.727 m)   Wt 65.2 kg (143 lb 11.8 oz)   SpO2 96%   BMI 21.86 kg/m   HEMODYNAMICS:    VENTILATOR SETTINGS: Vent Mode: PSV;CPAP FiO2 (%):  [40 %] 40 % PEEP:  [5 cmH20] 5 cmH20 Pressure Support:  [5 cmH20] 5 cmH20  INTAKE / OUTPUT: I/O last 3 completed shifts: In: 2663.8 [I.V.:2113.8; NG/GT:100; IV Piggyback:450] Out: 1925 [Urine:1410; Emesis/NG output:350; Drains:165]  PHYSICAL EXAMINATION: General: awakens, on wean Neuro: alert fc squeezing hands, NO neck pain on palpation HEENT: line clean, ett, collar PULM: coarse CV: s1 s2 rrr GI: soft, no bs, closed, dressing dry  Extremities: no edema  LABS:  BMET  Recent Labs Lab 08/05/16 0930  08/06/16 0408 08/07/16 0232  NA 138 136 138  K 4.2 3.7 3.8  CL 110 109 107  CO2 21* 22 24  BUN 12 11 10   CREATININE 0.81 0.73 0.79  GLUCOSE 129* 112* 84    Electrolytes  Recent Labs Lab 08/05/16 0247 08/05/16 0930 08/05/16 1539 08/06/16 0408 08/07/16 0232  CALCIUM 8.2* 8.0*  --  7.9* 7.8*  MG 1.1* 1.2* 2.0 2.4 1.7  PHOS 1.3*  --   --  2.8 2.4*    CBC  Recent Labs Lab 08/05/16 0930 08/06/16 0408 08/07/16 0232  WBC 16.2* 10.8* 10.3  HGB 13.4 11.7* 11.0*  HCT 38.8* 34.3* 32.2*  PLT 261 217 203    Coag's No results for input(s): APTT, INR in the last 168 hours.  Sepsis Markers  Recent Labs Lab 08/04/16 1902 08/04/16 2142 08/05/16 0247 08/06/16 0408  LATICACIDVEN 1.4 1.6  --  0.8  PROCALCITON 0.25  --  8.79 9.54    ABG  Recent Labs Lab 08/04/16 1641 08/05/16 0504 08/05/16 0513  PHART 7.370 7.346* 7.405  PCO2ART 47.1 20.0* 32.8  PO2ART 512.0* 126.0* 130.0*    Liver Enzymes  Recent Labs Lab 08/05/16 0930 08/06/16 0408  AST 56* 56*  ALT 19 19  ALKPHOS 30* 24*  BILITOT 1.5* 1.3*  ALBUMIN 3.0* 2.6*    Cardiac Enzymes  Recent Labs Lab 08/04/16 2150 08/05/16 0247  TROPONINI 1.01*  1.03*    Glucose  Recent Labs Lab 08/04/16 1546 08/05/16 1147  GLUCAP 142* 106*    Imaging No results found. STUDIES:  CXR 7/23 > massive pneumoperitoneum. CT chest 7/23 >  CT head 7/23 > No acute intracranial abnormality. Cerebral cortical atrophy. Remote right cerebellar lacunar infarct. CT A / P 7/23 > Massive pneumoperitoneum with free extravasation of contrast in the left hemiabdomen, consistent with viscus perforation. Apparent source is a defect within the anterior aspect/lesser curvature of the stomach, emphysema CXR 7/24>> No pneumoperitoneum  No pneumothorax or pneumomediastinum  probable subsegmental atelectasis in the left lower lobe.   CULTURES: Blood 7/23 >   ANTIBIOTICS: Zosyn 7/23 >>>7/25 unasyn 7/25>>>  Eraxis 7/23  >7/25 dilfucan 7/25>>>   SIGNIFICANT EVENTS: 7/23 > admit VF/ VT arrest 7/25  LINES/TUBES: ETT 7/23 > 7/25 OGT 7/23 > Left IJ 7/24>>>  DISCUSSION: 66 y.o. male admitted 7/23 after collapsed at [facility] and suffered VF/VT arrest.  Required 1 shock and 2 - 3 minutes CPR before ROSC.  Brought to ED where he was intubated and incidentally found to have massive pneumoperitoneum.  To OR.   ASSESSMENT / PLAN:  PULMONARY A: Acute hypoxic respiratory failure - s/p intubation in ED 7/23. Tobacco abuse Tolerating CPAP/ PS this am with good volumes  P:   Extubated 7/25  Pulmonary hygiene  pcxr in am Mobilize when ok with surgery     CARDIOVASCULAR A:  VF/VT arrest - unclear etiology.   Required 1 shock and 2 - 3 minutes CPR before ROSC. 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 Hx HTN, HLD, and CAD Troponin slight up trend over night 1.03 Not cooling candidate as stomach rupture/ contamination of peritoneal cavity/ infx risk LV thrombus  P:  Cardiology following, appreciate assist Heparin gtt started 7/25 pm  Keep tele Keep mag greater 2, k greater 4   RENAL A:   Hypokalemia. Hypocalcemia. Hypophos>>  Hypomag> Good UO  P:   kvo fluids  k supp bemt in am  Replete phos  GASTROINTESTINAL A:   Pneumoperitoneum -s/p Exploratory laparotomy with Omental patch of the stomach, 08/04/16, Dr. Lavone Neri Thompson>> JP with sero sang/ bloody drainage GI prophylaxis. Nutrition. P:   CCS following  For gastrograffin study in am  protonix gtt  NPO    HEMATOLOGIC A:   VTE Prophylaxis. Leukocytosis P:  SCD's / heparin. CBC in AM  INFECTIOUS A:   At risk peritonitis. Leukocytosis>> peritoneal cavity contamination/ perforated stomach  Peritoneal cavity contamination 2/2 stomach rupture Lactic Acid cleared to 1.6 PCt nonspecific P:   Abx.antifungal narrowed 7/25  Continue unasyn, diflucan   ENDOCRINE A:   No acute issues. P:   CBG  Q 4 SSI if glucose consistently > 180.  NEUROLOGIC A:   Acute encephalopathy - exam out of proportion to reported downtime. ? ETOH use P:   Avoid sedating medication  Daily WUA  thiamine and folate   Family updated: Wife updated at bedside 7/26  Interdisciplinary Family Meeting v Palliative Care Meeting:  Due by: 08/11/16.   Will t x tele, ask [facility] to assume care 7/27.   Nickolas Madrid, NP 08/07/2016  9:35 AM Pager: [phone] or [phone]  STAFF NOTE: I, Merrie Roof, MD FACP have personally reviewed patient's available data, including medical history, events of note, physical examination and test results as part of my evaluation. I have discussed with resident/NP and other care providers such as pharmacist, RN and RRT. In addition,  I personally evaluated patient and elicited key findings of: confused, no distress, jvd down, line clean ( will dc asap, want to determine fi needs tpn prior to dc), lungs slight crackles, no new pcxr, IS as able, start lasix to neg goal 1 liter, kvo, lytes supp, has confusion, get cognitive evaluation, PT consult, diet per surgery likley in am , LV clot, starting heparin, will need echo repeat in 1 month, I updated wife in full, I have managed resp failure today, heart clot failure, overload, lasix renal dz, infectious dz, unasyn to remain, stop date diflucan in place    [employer]. Titus Mould, MD, [place] Pgr: [facility] 08/07/2016 10:08 AM

## 2016-08-07 NOTE — Progress Notes (Signed)
[facility] Progress Note Patient Name: [name] [name] DOB: 1950-10-18 MRN: [id number]   Date of Service  08/07/2016  HPI/Events of Note  Contacted by bedside nurse. Patient with left internal jugular central venous catheter in place. Has heparin and Protonix infusions running. Evidently dressing was being changed and nurse reported that the catheter appeared to have previously been withdrawn approximately 5 cm.   eICU Interventions  Ordering stat portable chest x-ray.      Intervention Category Major Interventions: Other:  Tera Partridge 08/07/2016, 8:56 PM

## 2016-08-07 NOTE — Progress Notes (Signed)
3 Days Post-Op   Subjective/Chief Complaint: Extubated yesterday. Pain well controlled. A bit delirious this AM. Echo w/ LV thrombus, hep gtt started.   Objective: Vital signs in last 24 hours: Temp:  [96.4 F (35.8 C)-99.2 F (37.3 C)] 99 F (37.2 C) (07/26 0359) Pulse Rate:  [80-123] 93 (07/26 0600) Resp:  [10-24] 18 (07/26 0600) BP: (84-135)/(71-99) 109/78 (07/26 0600) SpO2:  [93 %-100 %] 100 % (07/26 0600) Arterial Line BP: (73-104)/(63-77) 104/77 (07/25 1000) FiO2 (%):  [40 %] 40 % (07/25 0958) Weight:  [65.2 kg (143 lb 11.8 oz)] 65.2 kg (143 lb 11.8 oz) (07/26 0627) Last BM Date:  (pta)  Intake/Output from previous day: 07/25 0701 - 07/26 0700 In: 1630.9 [I.V.:1180.9; IV Piggyback:450] Out: 2505 [Urine:1050; Drains:115] Intake/Output this shift: No intake/output data recorded.  Alert, answers questions appropriately but repeated questioning/ not oriented to situation Unlabored respirations, Clear bilaterally Abdomen soft, nondistended, appropriately tender. JP output serosanguinous. Midline wound with healthy/viable tissue, no abnormal drainage Skin warm and dry  Lab Results:   Recent Labs  08/06/16 0408 08/07/16 0232  WBC 10.8* 10.3  HGB 11.7* 11.0*  HCT 34.3* 32.2*  PLT 217 203   BMET  Recent Labs  08/06/16 0408 08/07/16 0232  NA 136 138  K 3.7 3.8  CL 109 107  CO2 22 24  GLUCOSE 112* 84  BUN 11 10  CREATININE 0.73 0.79  CALCIUM 7.9* 7.8*   PT/INR No results for input(s): LABPROT, INR in the last 72 hours. ABG  Recent Labs  08/05/16 0504 08/05/16 0513  PHART 7.346* 7.405  HCO3 10.9* 20.5    Studies/Results: Dg Chest Port 1 View  Result Date: 08/06/2016 CLINICAL DATA:  Respiratory failure.  Endotracheal tube present. EXAM: PORTABLE CHEST 1 VIEW COMPARISON:  08/05/2016; 08/04/2016 FINDINGS: Grossly unchanged cardiac silhouette and mediastinal contours. Stable position of support apparatus. Left basilar consolidative opacities and  potential trace left-sided effusion are unchanged. No new focal airspace opacities. No evidence of edema. No pneumothorax. No acute osseus abnormalities. Old right-sided rib fractures. IMPRESSION: 1.  Stable positioning of support apparatus.  No pneumothorax. 2. Unchanged small left-sided effusion and left basilar opacities, atelectasis versus infiltrate. Electronically Signed   By: Sandi Mariscal M.D.   On: 08/06/2016 07:41   Dg Chest Port 1 View  Result Date: 08/05/2016 CLINICAL DATA:  Status post central line placement EXAM: PORTABLE CHEST 1 VIEW COMPARISON:  Film from earlier in the same day. FINDINGS: Left jugular central line is now seen with the catheter tip in the mid superior vena cava. No pneumothorax is noted. Cardiac shadow is stable. Endotracheal tube and nasogastric catheter are stable as well as left upper quadrant surgical drain. Minimal small left pleural effusion is again noted. IMPRESSION: No pneumothorax following central line placement. The remainder the exam is stable. Electronically Signed   By: Inez Catalina M.D.   On: 08/05/2016 18:20    Anti-infectives: Anti-infectives    Start     Dose/Rate Route Frequency Ordered Stop   08/06/16 1200  fluconazole (DIFLUCAN) IVPB 400 mg     400 mg 100 mL/hr over 120 Minutes Intravenous Every 24 hours 08/06/16 0959 08/11/16 1159   08/06/16 1100  Ampicillin-Sulbactam (UNASYN) 3 g in sodium chloride 0.9 % 100 mL IVPB     3 g 200 mL/hr over 30 Minutes Intravenous Every 6 hours 08/06/16 0959     08/05/16 1730  anidulafungin (ERAXIS) 100 mg in sodium chloride 0.9 % 100 mL IVPB  Status:  Discontinued     100 mg 78 mL/hr over 100 Minutes Intravenous Every 24 hours 08/04/16 1657 08/06/16 0959   08/05/16 0000  piperacillin-tazobactam (ZOSYN) IVPB 3.375 g  Status:  Discontinued     3.375 g 12.5 mL/hr over 240 Minutes Intravenous Every 8 hours 08/04/16 1717 08/06/16 0959   08/04/16 1800  piperacillin-tazobactam (ZOSYN) IVPB 3.375 g  Status:   Discontinued     3.375 g 100 mL/hr over 30 Minutes Intravenous To Surgery 08/04/16 1754 08/04/16 1848   08/04/16 1730  anidulafungin (ERAXIS) 200 mg in sodium chloride 0.9 % 200 mL IVPB     200 mg 78 mL/hr over 200 Minutes Intravenous  Once 08/04/16 1657 08/05/16 0222   08/04/16 1700  piperacillin-tazobactam (ZOSYN) IVPB 3.375 g  Status:  Discontinued     3.375 g 100 mL/hr over 30 Minutes Intravenous  Once 08/04/16 1655 08/06/16 0959     PATHOLOGY:  Stomach, biopsy - GASTRIC MUCOSA WITH HEMORRHAGE AND PERFORATION - NO H. PYLORI OR INTESTINAL METAPLASIA IDENTIFIED - NO MALIGNANCY IDENTIFIED  Assessment/Plan: s/p Procedure(s): EXPLORATORY LAPAROTOMY FREE AIR BIOPSY OF PERFORATED STOMACH ULCER REPAIR OF PERFORATED STOMACH ULCER WITH A OMENTAL PATCH (N/A) Continue STRICT NPO Continue IV abx, IV PPI Will plan to do gastrofraffin upper gi tomorrow   LOS: 3 days    [name] [name] 08/07/2016

## 2016-08-07 NOTE — Progress Notes (Signed)
ANTICOAGULATION CONSULT NOTE - Follow Up Consult  Pharmacy Consult for heparin Indication: LV thrombus  Labs:  Recent Labs  08/04/16 2150 08/05/16 0247 08/05/16 0930 08/06/16 0408 08/07/16 0145 08/07/16 0232  HGB  --  13.2 13.4 11.7*  --  11.0*  HCT  --  38.2* 38.8* 34.3*  --  32.2*  PLT  --  281 261 217  --  203  HEPARINUNFRC  --   --   --   --  0.71*  --   CREATININE  --  0.82 0.81 0.73  --  0.79  TROPONINI 1.01* 1.03*  --   --   --   --     Assessment: 65yo male slightly above goal on heparin with initial dosing for LV thrombus.  Goal of Therapy:  Heparin level 0.3-0.7 units/ml   Plan:  Will decrease heparin gtt slightly to 1050 units/hr and check level in 6hr.  Wynona Neat, PharmD, BCPS  08/07/2016,3:05 AM

## 2016-08-07 NOTE — Progress Notes (Signed)
ANTICOAGULATION CONSULT NOTE - Follow Up Consult  Pharmacy Consult for heparin Indication: LV thrombus  Labs:  Recent Labs  08/04/16 2150 08/05/16 0247 08/05/16 0930 08/06/16 0408 08/07/16 0145 08/07/16 0232 08/07/16 0900  HGB  --  13.2 13.4 11.7*  --  11.0*  --   HCT  --  38.2* 38.8* 34.3*  --  32.2*  --   PLT  --  281 261 217  --  203  --   HEPARINUNFRC  --   --   --   --  0.71*  --  0.66  CREATININE  --  0.82 0.81 0.73  --  0.79  --   TROPONINI 1.01* 1.03*  --   --   --   --   --     Assessment: 65yoM on heparin drip for LV thrombus. Heparin level therapeutic after recent dose adjustment, CBC stable, no S/Sx bleeding per RN.  Goal of Therapy:  Heparin level 0.3-0.7 units/ml   Plan:  -Continue heparin at 1050 units/hr -Check 6-hour confirmatory level -Monitor heparin level, CBC, S/Sx bleeding daily -F/U long-term anticoagulation plans  Arrie Senate, PharmD PGY-2 Cardiology Pharmacy Resident Pager: [phone] 08/07/2016

## 2016-08-07 NOTE — Progress Notes (Signed)
ANTICOAGULATION CONSULT NOTE - Follow Up Consult  Pharmacy Consult for heparin Indication: LV thrombus  Labs:  Recent Labs  08/04/16 2150  08/05/16 0247 08/05/16 0930 08/06/16 0408 08/07/16 0145 08/07/16 0232 08/07/16 0900 08/07/16 1505  HGB  --   < > 13.2 13.4 11.7*  --  11.0*  --   --   HCT  --   < > 38.2* 38.8* 34.3*  --  32.2*  --   --   PLT  --   < > 281 261 217  --  203  --   --   HEPARINUNFRC  --   --   --   --   --  0.71*  --  0.66 0.68  CREATININE  --   < > 0.82 0.81 0.73  --  0.79  --   --   TROPONINI 1.01*  --  1.03*  --   --   --   --   --   --   < > = values in this interval not displayed.  Assessment: 65yoM on heparin drip for LV thrombus.   Confirmatory heparin level borderline high, CBC stable, no bleeding per RN. Decrease rate slightly.  Goal of Therapy:  Heparin level 0.3-0.7 units/ml   Plan:  -Decrease heparin to 1000 units/hr -Monitor heparin level, CBC, S/Sx bleeding daily -F/U long-term anticoagulation plans  Dierdre Harness, PharmD Clinical Pharmacist [phone] (Pager) 08/07/2016 5:20 PM

## 2016-08-08 ENCOUNTER — Inpatient Hospital Stay (HOSPITAL_COMMUNITY): Payer: PPO

## 2016-08-08 DIAGNOSIS — E876 Hypokalemia: Secondary | ICD-10-CM

## 2016-08-08 DIAGNOSIS — I513 Intracardiac thrombosis, not elsewhere classified: Secondary | ICD-10-CM

## 2016-08-08 DIAGNOSIS — G9340 Encephalopathy, unspecified: Secondary | ICD-10-CM

## 2016-08-08 LAB — BASIC METABOLIC PANEL
ANION GAP: 9 (ref 5–15)
BUN: 13 mg/dL (ref 6–20)
CHLORIDE: 107 mmol/L (ref 101–111)
CO2: 23 mmol/L (ref 22–32)
Calcium: 7.9 mg/dL — ABNORMAL LOW (ref 8.9–10.3)
Creatinine, Ser: 0.79 mg/dL (ref 0.61–1.24)
GFR calc Af Amer: >60 mL/min (ref >60)
GFR calc non Af Amer: >60 mL/min (ref >60)
Glucose, Bld: 101 mg/dL — ABNORMAL HIGH (ref 65–99)
POTASSIUM: 3.2 mmol/L — AB (ref 3.5–5.1)
Sodium: 139 mmol/L (ref 135–145)

## 2016-08-08 LAB — CBC
HCT: 27.3 % — ABNORMAL LOW (ref 39.0–52.0)
HEMOGLOBIN: 9.5 g/dL — AB (ref 13.0–17.0)
MCH: 34.1 pg — ABNORMAL HIGH (ref 26.0–34.0)
MCHC: 34.8 g/dL (ref 30.0–36.0)
MCV: 97.8 fL (ref 78.0–100.0)
Platelets: 199 10*3/uL (ref 150–400)
RBC: 2.79 MIL/uL — AB (ref 4.22–5.81)
RDW: 13.8 % (ref 11.5–15.5)
WBC: 8.3 10*3/uL (ref 4.0–10.5)

## 2016-08-08 LAB — GLUCOSE, CAPILLARY: GLUCOSE-CAPILLARY: 159 mg/dL — AB (ref 65–99)

## 2016-08-08 LAB — HEPARIN LEVEL (UNFRACTIONATED): HEPARIN UNFRACTIONATED: 0.38 [IU]/mL (ref 0.30–0.70)

## 2016-08-08 MED ORDER — LORAZEPAM 2 MG/ML IJ SOLN
1.0000 mg | Freq: Four times a day (QID) | INTRAMUSCULAR | Status: DC | PRN
  Administered 2016-08-09: 1 mg via INTRAVENOUS
  Filled 2016-08-08: qty 1

## 2016-08-08 MED ORDER — MAGNESIUM SULFATE 2 GM/50ML IV SOLN
2.0000 g | Freq: Once | INTRAVENOUS | Status: AC
  Administered 2016-08-08: 2 g via INTRAVENOUS
  Filled 2016-08-08: qty 50

## 2016-08-08 MED ORDER — POTASSIUM CHLORIDE 20 MEQ/15ML (10%) PO SOLN
40.0000 meq | Freq: Once | ORAL | Status: AC
  Administered 2016-08-08: 40 meq via ORAL
  Filled 2016-08-08: qty 30

## 2016-08-08 MED ORDER — IOPAMIDOL (ISOVUE-300) INJECTION 61%
150.0000 mL | Freq: Once | INTRAVENOUS | Status: AC | PRN
  Administered 2016-08-08: 100 mL via ORAL

## 2016-08-08 MED ORDER — PANTOPRAZOLE SODIUM 40 MG IV SOLR
40.0000 mg | Freq: Two times a day (BID) | INTRAVENOUS | Status: DC
  Administered 2016-08-08 – 2016-08-11 (×7): 40 mg via INTRAVENOUS
  Filled 2016-08-08 (×8): qty 40

## 2016-08-08 MED ORDER — VITAMIN B-1 100 MG PO TABS: 100.0000 mg | ORAL_TABLET | Freq: Every day | ORAL | Status: DC

## 2016-08-08 MED ORDER — LORAZEPAM 1 MG PO TABS
1.0000 mg | ORAL_TABLET | Freq: Four times a day (QID) | ORAL | Status: DC | PRN
Start: 2016-08-08 — End: 2016-08-10

## 2016-08-08 MED ORDER — ADULT MULTIVITAMIN W/MINERALS CH
1.0000 | ORAL_TABLET | Freq: Every day | ORAL | Status: DC
  Administered 2016-08-08 – 2016-08-14 (×5): 1 via ORAL
  Filled 2016-08-08 (×6): qty 1

## 2016-08-08 MED ORDER — FOLIC ACID 1 MG PO TABS
1.0000 mg | ORAL_TABLET | Freq: Every day | ORAL | Status: DC
  Administered 2016-08-08 – 2016-08-14 (×5): 1 mg via ORAL
  Filled 2016-08-08 (×6): qty 1

## 2016-08-08 MED ORDER — IOPAMIDOL (ISOVUE-300) INJECTION 61%
INTRAVENOUS | Status: AC
  Filled 2016-08-08: qty 150

## 2016-08-08 MED ORDER — THIAMINE HCL 100 MG/ML IJ SOLN: 100.0000 mg | Freq: Every day | INTRAMUSCULAR | Status: DC

## 2016-08-08 NOTE — Progress Notes (Signed)
Dressing change with split gauze to JP drain. Scant bloody drainage noted to old dressing.

## 2016-08-08 NOTE — Progress Notes (Signed)
PROGRESS NOTE  [name] [name] FXT:[id number] DOB: [birth year] DOA: 08/04/2016 PCP: No primary care provider on file.  HPI/Recap of past 24 hours:  Slight confusion, report pain and swelling in upper left chest wall  Wife at bedside  Assessment/Plan: Active Problems:   Pneumoperitoneum   Syncope and collapse   Acute respiratory failure (HCC)    Cardiac arrest:  VF/VT arrest - unclear etiology. Required 1 shock and 2 - 3 minutes CPR before ROSC. 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 out of hospital/ witness/ not able to have hypothermia protocol due to perforated viscus He was intubated and admitted to icu,  He has improved , extubated and transferred to hospitalist service on 7/27  LV thrombus:  on heparin drip Cardiology consulted, cardiac work up once surgical issue for perforated viscus imporves.   PERFORATED STOMACH ULCER s/p  EXPLORATORY LAPAROTOMY FREE AIR BIOPSY OF Continue IV abx, IV PPI Diet advancement per general surgery ,will follow general surgery recommendations   Hypokalemia/hypomagnesemia: replace k/mag  Encephalopahty:  Anoxic brain injury from cardiac arrest?  ICU delirium? He also has h/o alcohol use Start ciwa protocol Sitter in room  Malfunction central line, removed, chest wall pain, will get ct chest  Code Status: full  Family Communication: patient and wife at bedside  Disposition Plan: not ready for discharge   Consultants:  pccm admit, transferred to [facility] hospitalist on 7/27  General surgery  cardiology  Procedures:  Intubation/extubation EXPLORATORY LAPAROTOMY FREE AIR BIOPSY OF PERFORATED STOMACH ULCER REPAIR OF PERFORATED STOMACH ULCER WITH A OMENTAL PATCH (N/A)  Antibiotics:  unasyn/diflucan   Objective: BP (!) 162/76 (BP Location: Left Arm)   Pulse 94   Temp 98 F (36.7 C) (Oral)   Resp 18   Ht 5\' 8"  (1.727 m)   Wt 61.6 kg (135 lb 11.2 oz)   SpO2 96%   BMI 20.63 kg/m    Intake/Output Summary (Last 24 hours) at 08/08/16 0747 Last data filed at 08/08/16 2992  Gross per 24 hour  Intake          2555.67 ml  Output              765 ml  Net          1790.67 ml   Filed Weights   08/07/16 0627 08/07/16 1319 08/08/16 0350  Weight: 65.2 kg (143 lb 11.8 oz) 62.3 kg (137 lb 4.8 oz) 61.6 kg (135 lb 11.2 oz)    Exam:   General:  NAD, slight confusion  Cardiovascular: RRR  Respiratory: diminished at basis, no wheezing, no rhonchi  Abdomen: potst op changes, dressing intact, soft, nondistended, drain in place with serosanguinous fluids, positive BS  Musculoskeletal: No Edema  Neuro: slight confusion  Data Reviewed: Basic Metabolic Panel:  Recent Labs Lab 08/05/16 0247 08/05/16 0930 08/05/16 1539 08/06/16 0408 08/07/16 0232 08/08/16 0425  NA 137 138  --  136 138 139  K 4.2 4.2  --  3.7 3.8 3.2*  CL 112* 110  --  109 107 107  CO2 22 21*  --  22 24 23   GLUCOSE 148* 129*  --  112* 84 101*  BUN 9 12  --  11 10 13   CREATININE 0.82 0.81  --  0.73 0.79 0.79  CALCIUM 8.2* 8.0*  --  7.9* 7.8* 7.9*  MG 1.1* 1.2* 2.0 2.4 1.7  --   PHOS 1.3*  --   --  2.8 2.4*  --  Liver Function Tests:  Recent Labs Lab 08/05/16 0930 08/06/16 0408  AST 56* 56*  ALT 19 19  ALKPHOS 30* 24*  BILITOT 1.5* 1.3*  PROT 4.9* 4.5*  ALBUMIN 3.0* 2.6*   No results for input(s): LIPASE, AMYLASE in the last 168 hours. No results for input(s): AMMONIA in the last 168 hours. CBC:  Recent Labs Lab 08/04/16 1557  08/05/16 0247 08/05/16 0930 08/06/16 0408 08/07/16 0232 08/08/16 0425  WBC 17.6*  --  13.8* 16.2* 10.8* 10.3 8.3  NEUTROABS 9.8*  --   --   --   --   --   --   HGB 15.7  < > 13.2 13.4 11.7* 11.0* 9.5*  HCT 45.1  < > 38.2* 38.8* 34.3* 32.2* 27.3*  MCV 100.7*  --  100.0 100.3* 100.0 99.7 97.8  PLT 378  --  281 261 217 203 199  < > = values in this interval not displayed. Cardiac Enzymes:    Recent Labs Lab 08/04/16 2150 08/05/16 0247  TROPONINI  1.01* 1.03*   BNP (last 3 results) No results for input(s): BNP in the last 8760 hours.  ProBNP (last 3 results) No results for input(s): PROBNP in the last 8760 hours.  CBG:  Recent Labs Lab 08/04/16 1546 08/05/16 1147  GLUCAP 142* 106*    Recent Results (from the past 240 hour(s))  MRSA PCR Screening     Status: None   Collection Time: 08/04/16  7:03 PM  Result Value Ref Range Status   MRSA by PCR NEGATIVE NEGATIVE Final    Comment:        The GeneXpert MRSA Assay (FDA approved for NASAL specimens only), is one component of a comprehensive MRSA colonization surveillance program. It is not intended to diagnose MRSA infection nor to guide or monitor treatment for MRSA infections.   Culture, blood (Routine X 2) w Reflex to ID Panel     Status: None (Preliminary result)   Collection Time: 08/04/16  7:30 PM  Result Value Ref Range Status   Specimen Description BLOOD LEFT HAND  Final   Special Requests IN PEDIATRIC BOTTLE Blood Culture adequate volume  Final   Culture NO GROWTH 3 DAYS  Final   Report Status PENDING  Incomplete  Culture, blood (Routine X 2) w Reflex to ID Panel     Status: None (Preliminary result)   Collection Time: 08/04/16  7:42 PM  Result Value Ref Range Status   Specimen Description BLOOD LEFT WRIST  Final   Special Requests IN PEDIATRIC BOTTLE Blood Culture adequate volume  Final   Culture NO GROWTH 3 DAYS  Final   Report Status PENDING  Incomplete     Studies: Dg Chest Portable 1 View  Result Date: 08/08/2016 CLINICAL DATA:  Respiratory failure EXAM: PORTABLE CHEST 1 VIEW COMPARISON:  08/07/2016 FINDINGS: Left central line remains in place, unchanged. Left lower lobe atelectasis or infiltrate is stable. Heart is borderline in size. IMPRESSION: No significant change since prior study. Electronically Signed   By: Rolm Baptise M.D.   On: 08/08/2016 07:41   Dg Chest Port 1 View  Result Date: 08/07/2016 CLINICAL DATA:  Status post central line  placement EXAM: PORTABLE CHEST 1 VIEW COMPARISON:  08/06/2016 FINDINGS: Cardiac shadow is stable. Left central venous line is noted with catheter tip in the proximal SVC. The catheter is looped just below the skin entry site. The lungs are clear. Endotracheal tube and nasogastric catheter have been removed. Persistent left retrocardiac density is noted.  No other focal acute abnormality is seen. IMPRESSION: Central line looped under the skin surface although the catheter tip remains in the proximal SVC. Electronically Signed   By: Inez Catalina M.D.   On: 08/07/2016 21:19    Scheduled Meds: . Chlorhexidine Gluconate Cloth  6 each Topical Daily  . folic acid  1 mg Intravenous Daily  . furosemide  20 mg Intravenous BID  . iopamidol      . pneumococcal 23 valent vaccine  0.5 mL Intramuscular Tomorrow-1000  . sodium chloride flush  10-40 mL Intracatheter Q12H  . thiamine  100 mg Intravenous Daily    Continuous Infusions: . sodium chloride 10 mL/hr at 08/07/16 0600  . sodium chloride    . ampicillin-sulbactam (UNASYN) IV Stopped (08/08/16 2233)  . fluconazole (DIFLUCAN) IV Stopped (08/07/16 1324)  . heparin 1,000 Units/hr (08/07/16 1720)  . pantoprozole (PROTONIX) infusion 8 mg/hr (08/08/16 0026)     Time spent: 25mins  Georjean Toya MD, PhD  [facility] Pager [phone]. If 7PM-7AM, please contact night-coverage at [email or web address], password [facility] 08/08/2016, 7:47 AM  LOS: 4 days

## 2016-08-08 NOTE — Progress Notes (Signed)
Swelling noted in left upper chest pt said it feels tight. IVF D/Cd in Left IJ CVC catheter.

## 2016-08-08 NOTE — Progress Notes (Signed)
Pt returns from CT, to re-evaluate the left chest "bulge" that has appeared since last night.

## 2016-08-08 NOTE — Progress Notes (Signed)
ANTICOAGULATION CONSULT NOTE - Follow Up Consult  Pharmacy Consult for heparin Indication: LV thrombus  Labs:  Recent Labs  08/06/16 0408  08/07/16 0232 08/07/16 0900 08/07/16 1505 08/08/16 0425 08/08/16 0612  HGB 11.7*  --  11.0*  --   --  9.5*  --   HCT 34.3*  --  32.2*  --   --  27.3*  --   PLT 217  --  203  --   --  199  --   HEPARINUNFRC  --   < >  --  0.66 0.68  --  0.38  CREATININE 0.73  --  0.79  --   --  0.79  --   < > = values in this interval not displayed.  Assessment: 65yoM on heparin drip for LV thrombus.  Heparin level therapeutic this AM  Goal of Therapy:  Heparin level 0.3-0.7 units/ml   Plan:  -Continue heparin at 1000 units / hr -Monitor heparin level, CBC, S/Sx bleeding daily -F/U long-term anticoagulation plans  Thank you  Anette Guarneri, PharmD [phone] 08/08/2016 1:31 PM

## 2016-08-08 NOTE — Progress Notes (Signed)
Page to Dr Erlinda Hong  3e15 [name] [name]) just FYI per wife pt is alcoholic, consider CIWA precautions/orders. have a good evening.

## 2016-08-08 NOTE — Progress Notes (Signed)
  Safety zone complete.  MD notified.Follow-up vitals/ekg/additional orders complete.  Pt denies fall, states he sat down. Pt is negative for injury, neuro assessment negative (other than baseline confusion).  Family notified, x 2. Family made aware of relocation of patient to room 15.

## 2016-08-08 NOTE — Evaluation (Signed)
Speech Language Pathology Evaluation Patient Details Name: [name] [name] MRN: [id number] DOB: 14-Nov-1950 Today's Date: 08/08/2016 Time: 1220-1300 SLP Time Calculation (min) (ACUTE ONLY): 40 min  Problem List:  Patient Active Problem List   Diagnosis Date Noted  . Acute respiratory failure ([place])   . Pneumoperitoneum 08/04/2016  . Cardiac arrest ([place])   . Syncope and collapse    Past Medical History: History reviewed. No pertinent past medical history. Past Surgical History:  Past Surgical History:  Procedure Laterality Date  . CORONARY ANGIOPLASTY     "Dr. Vidal Schwalbe"  . LAPAROTOMY N/A 08/04/2016   Procedure: EXPLORATORY LAPAROTOMY FREE AIR BIOPSY OF PERFORATED STOMACH ULCER REPAIR OF PERFORATED STOMACH ULCER WITH A OMENTAL PATCH;  Surgeon: Georganna Skeans, MD;  Location: [facility];  Service: General;  Laterality: N/A;   HPI:  66 y.o.maleadmitted 7/23 after collapsed at [facility] and suffered VF/VT arrest. Required 1 shock and 2 - 3 minutes CPR before ROSC. Brought to ED where he was intubated and incidentally found to have massive pneumoperitoneum. ETT 7/23-25. Ex lap 7/23. Persisting acute encephalopathy;  cognitive assessment ordered.  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    Assessment / Plan / Recommendation Clinical Impression  Pt's cognition assessed using the [facility] Cognitive Assessment) - pt achieved score of 19/30, mild-mod cognitive impairment, with deficits specific to attention and recall.  Pt had difficulty retaining task instructions in order to accurately solve problems.  He had no recall of word list of five.  He was unable to recall instructions for path-finding task.  He remains disoriented to elements of time.  Pt affirms confusion.  Educated pt/wife re: results, recommendations.  Will follow up to determine progress and need for 24 supervision at D/C.     SLP Assessment  SLP Recommendation/Assessment: Patient needs continued Speech Lanaguage Pathology  Services SLP Visit Diagnosis: Cognitive communication deficit (R41.841)    Follow Up Recommendations   (tba)    Frequency and Duration min 2x/week  1 week      SLP Evaluation Cognition  Overall Cognitive Status: Impaired/Different from baseline Arousal/Alertness: Awake/alert Orientation Level: Oriented to person;Oriented to place;Disoriented to time Attention: Selective Selective Attention: Impaired Memory: Impaired Memory Impairment: Storage deficit;Decreased recall of new information;Prospective memory Awareness: Impaired Awareness Impairment: Anticipatory impairment Behaviors: Impulsive Safety/Judgment: Impaired       Comprehension  Auditory Comprehension Overall Auditory Comprehension: Appears within functional limits for tasks assessed Visual Recognition/Discrimination Discrimination: Within Function Limits Reading Comprehension Reading Status: Within funtional limits    Expression Expression Primary Mode of Expression: Verbal Verbal Expression Overall Verbal Expression: Appears within functional limits for tasks assessed   Oral / Motor  Oral Motor/Sensory Function Overall Oral Motor/Sensory Function: Within functional limits Motor Speech Overall Motor Speech: Appears within functional limits for tasks assessed   GO                   [name] [name] L. Tivis Ringer, [place] CCC/SLP Pager [phone]  [name] [name] 08/08/2016, 2:16 PM

## 2016-08-08 NOTE — Progress Notes (Signed)
Pt to be transferred to room closer to nurses station today, upon availability, due to high fall risk/confusion

## 2016-08-08 NOTE — Care Management Important Message (Signed)
Important Message  Patient Details  Name: [name] [name] MRN: [id number] Date of Birth: 01-01-1951   Medicare Important Message Given:  Yes    Tameyah Koch [address] 08/08/2016, 2:31 PM

## 2016-08-08 NOTE — Progress Notes (Signed)
4 Days Post-Op   Subjective/Chief Complaint: Still a little confused. C/o pain in left upper chest from central line which has been removed. No nausea, abdominal pain only with palpation.    Objective: Vital signs in last 24 hours: Temp:  [97.6 F (36.4 C)-98.7 F (37.1 C)] 98 F (36.7 C) (07/27 0734) Pulse Rate:  [86-116] 94 (07/27 0734) Resp:  [17-20] 18 (07/27 0734) BP: (118-168)/(73-87) 162/76 (07/27 0734) SpO2:  [92 %-96 %] 96 % (07/27 0734) Weight:  [61.6 kg (135 lb 11.2 oz)-62.3 kg (137 lb 4.8 oz)] 61.6 kg (135 lb 11.2 oz) (07/27 0350) Last BM Date:  (pta)  Intake/Output from previous day: 07/26 0701 - 07/27 0700 In: 2555.7 [I.V.:1055.7; IV Piggyback:1460] Out: 765 [Urine:725; Drains:40] Intake/Output this shift: No intake/output data recorded.  Alert, answers questions appropriately Unlabored respirations, Clear bilaterally Abdomen soft, nondistended, appropriately tender. JP output serosanguinous. Midline wound with healthy/viable tissue, oozing when dressing changed.  Skin warm and dry  Lab Results:   Recent Labs  08/07/16 0232 08/08/16 0425  WBC 10.3 8.3  HGB 11.0* 9.5*  HCT 32.2* 27.3*  PLT 203 199   BMET  Recent Labs  08/07/16 0232 08/08/16 0425  NA 138 139  K 3.8 3.2*  CL 107 107  CO2 24 23  GLUCOSE 84 101*  BUN 10 13  CREATININE 0.79 0.79  CALCIUM 7.8* 7.9*   PT/INR No results for input(s): LABPROT, INR in the last 72 hours. ABG No results for input(s): PHART, HCO3 in the last 72 hours.  Invalid input(s): PCO2, PO2  Studies/Results: Dg Chest Portable 1 View  Result Date: 08/08/2016 CLINICAL DATA:  Respiratory failure EXAM: PORTABLE CHEST 1 VIEW COMPARISON:  08/07/2016 FINDINGS: Left central line remains in place, unchanged. Left lower lobe atelectasis or infiltrate is stable. Heart is borderline in size. IMPRESSION: No significant change since prior study. Electronically Signed   By: Rolm Baptise M.D.   On: 08/08/2016 07:41   Dg  Chest Port 1 View  Result Date: 08/07/2016 CLINICAL DATA:  Status post central line placement EXAM: PORTABLE CHEST 1 VIEW COMPARISON:  08/06/2016 FINDINGS: Cardiac shadow is stable. Left central venous line is noted with catheter tip in the proximal SVC. The catheter is looped just below the skin entry site. The lungs are clear. Endotracheal tube and nasogastric catheter have been removed. Persistent left retrocardiac density is noted. No other focal acute abnormality is seen. IMPRESSION: Central line looped under the skin surface although the catheter tip remains in the proximal SVC. Electronically Signed   By: Inez Catalina M.D.   On: 08/07/2016 21:19   Dg Duanne Limerick W/water Sol Cm  Result Date: 08/08/2016 CLINICAL DATA:  Perforated esophageal surgery EXAM: WATER SOLUBLE UPPER GI SERIES TECHNIQUE: Single-column upper GI series was performed using water soluble contrast. CONTRAST:  100 cc of Isovue-300 COMPARISON:  None. FLUOROSCOPY TIME:  Fluoroscopy Time:  1 minutes 48 Number of Acquired Spot Images: 1 FINDINGS: Contrast was ingested in easily passed through the esophagus and into the stomach. With the patient in the right lateral orientation the stomach passed from the proximal stomach into the duodenum. Contrast was followed through the duodenum and beyond the level of the ligament of Treitz. No extravasation of contrast material identified. IMPRESSION: 1. No extravasation of contrast material identified to suggest leakage. Electronically Signed   By: Kerby Moors M.D.   On: 08/08/2016 09:05    Anti-infectives: Anti-infectives    Start     Dose/Rate Route Frequency Ordered Stop  08/06/16 1200  fluconazole (DIFLUCAN) IVPB 400 mg     400 mg 100 mL/hr over 120 Minutes Intravenous Every 24 hours 08/06/16 0959 08/11/16 1159   08/06/16 1100  Ampicillin-Sulbactam (UNASYN) 3 g in sodium chloride 0.9 % 100 mL IVPB     3 g 200 mL/hr over 30 Minutes Intravenous Every 6 hours 08/06/16 0959     08/05/16 1730   anidulafungin (ERAXIS) 100 mg in sodium chloride 0.9 % 100 mL IVPB  Status:  Discontinued     100 mg 78 mL/hr over 100 Minutes Intravenous Every 24 hours 08/04/16 1657 08/06/16 0959   08/05/16 0000  piperacillin-tazobactam (ZOSYN) IVPB 3.375 g  Status:  Discontinued     3.375 g 12.5 mL/hr over 240 Minutes Intravenous Every 8 hours 08/04/16 1717 08/06/16 0959   08/04/16 1800  piperacillin-tazobactam (ZOSYN) IVPB 3.375 g  Status:  Discontinued     3.375 g 100 mL/hr over 30 Minutes Intravenous To Surgery 08/04/16 1754 08/04/16 1848   08/04/16 1730  anidulafungin (ERAXIS) 200 mg in sodium chloride 0.9 % 200 mL IVPB     200 mg 78 mL/hr over 200 Minutes Intravenous  Once 08/04/16 1657 08/05/16 0222   08/04/16 1700  piperacillin-tazobactam (ZOSYN) IVPB 3.375 g  Status:  Discontinued     3.375 g 100 mL/hr over 30 Minutes Intravenous  Once 08/04/16 1655 08/06/16 0959     PATHOLOGY:  Stomach, biopsy - GASTRIC MUCOSA WITH HEMORRHAGE AND PERFORATION - NO H. PYLORI OR INTESTINAL METAPLASIA IDENTIFIED - NO MALIGNANCY IDENTIFIED  Assessment/Plan: s/p Procedure(s): EXPLORATORY LAPAROTOMY FREE AIR BIOPSY OF PERFORATED STOMACH ULCER REPAIR OF PERFORATED STOMACH ULCER WITH A OMENTAL PATCH (N/A) Continue IV abx, IV PPI UGi shows no leak- will start sips of clears today   LOS: 4 days    [name] [name] 08/08/2016

## 2016-08-08 NOTE — Progress Notes (Signed)
3e02  EKG complete. ST w PsVCs & fusion complexes per auto-read. relocating pt to 3e15   Pt to MD .

## 2016-08-08 NOTE — Progress Notes (Signed)
Pt pulling dressing off his CVC site because of itching. Reinforced dressing and reminded patient not to put his hands on the site.

## 2016-08-08 NOTE — Progress Notes (Signed)
   08/08/16 1550  What Happened  Was fall witnessed? No  Was patient injured? No  Patient found on floor  Found by Staff-comment  Stated prior activity other (comment) (pt attempted to get to [facility] without assistance. )  Follow Up  MD notified Xu,MD  Time MD notified 41  Family notified Yes-comment (Daughter, called pt's cell phone shortly after. Updated.)  Time family notified 1630  Additional tests Yes-comment  Simple treatment Dressing (pre-existing, changed. )  Vitals  Temp 98 F (36.7 C)  Temp Source Oral  BP 128/62  BP Location Left Arm  BP Method Automatic  Patient Position (if appropriate) Lying  Pulse Rate (!) 115  Pulse Rate Source Dinamap  ECG Heart Rate (!) 118  Resp 16  Oxygen Therapy  SpO2 98 %  O2 Device Room Air  Pain Assessment  Pain Assessment No/denies pain  Pain Score 0  Neurological  Neuro (WDL) X  Level of Consciousness Alert  Orientation Level Oriented to person;Disoriented to situation;Disoriented to time;Disoriented to place  Cognition Poor attention/concentration;Follows commands;Memory impairment (no change from morning assessment.)  Speech Clear;Appropriate for developmental age  Pupil Assessment  Yes  R Pupil Size (mm) 3  R Pupil Shape Round  R Pupil Reaction Sluggish  L Pupil Size (mm) 3  L Pupil Shape Round  L Pupil Reaction Sluggish  Additional Pupil Assessments No  Motor Function/Sensation Assessment Grip;Motor response  R Hand Grip Present;Strong  L Hand Grip Present;Strong  RUE Motor Response Purposeful movement;Responds to commands  LUE Motor Response Responds to commands;Purposeful movement  RLE Motor Response Responds to commands;Purposeful movement  LLE Motor Response Purposeful movement;Responds to commands  Neuro Symptoms Forgetful  Musculoskeletal  Musculoskeletal (WDL) X  Assistive Device None  Generalized Weakness Yes  Weight Bearing Restrictions No  Integumentary  Integumentary (WDL) X  Skin Color  Appropriate for ethnicity  Skin Condition Dry  Skin Integrity Surgical Incision (see LDA)  Skin Turgor Non-tenting

## 2016-08-09 DIAGNOSIS — D62 Acute posthemorrhagic anemia: Secondary | ICD-10-CM

## 2016-08-09 DIAGNOSIS — I255 Ischemic cardiomyopathy: Secondary | ICD-10-CM

## 2016-08-09 DIAGNOSIS — R58 Hemorrhage, not elsewhere classified: Secondary | ICD-10-CM

## 2016-08-09 DIAGNOSIS — I493 Ventricular premature depolarization: Secondary | ICD-10-CM

## 2016-08-09 DIAGNOSIS — T148XXA Other injury of unspecified body region, initial encounter: Secondary | ICD-10-CM

## 2016-08-09 DIAGNOSIS — S299XXA Unspecified injury of thorax, initial encounter: Secondary | ICD-10-CM

## 2016-08-09 LAB — CULTURE, BLOOD (ROUTINE X 2)
Culture: NO GROWTH
Culture: NO GROWTH
SPECIAL REQUESTS: ADEQUATE
Special Requests: ADEQUATE

## 2016-08-09 LAB — MAGNESIUM: MAGNESIUM: 2.3 mg/dL (ref 1.7–2.4)

## 2016-08-09 LAB — BASIC METABOLIC PANEL
Anion gap: 10 (ref 5–15)
BUN: 24 mg/dL — ABNORMAL HIGH (ref 6–20)
CO2: 23 mmol/L (ref 22–32)
Calcium: 7.9 mg/dL — ABNORMAL LOW (ref 8.9–10.3)
Chloride: 106 mmol/L (ref 101–111)
Creatinine, Ser: 0.93 mg/dL (ref 0.61–1.24)
GFR calc Af Amer: >60 mL/min (ref >60)
GFR calc non Af Amer: >60 mL/min (ref >60)
Glucose, Bld: 134 mg/dL — ABNORMAL HIGH (ref 65–99)
Potassium: 3.6 mmol/L (ref 3.5–5.1)
Sodium: 139 mmol/L (ref 135–145)

## 2016-08-09 LAB — PROTIME-INR
INR: 1.39
PROTHROMBIN TIME: 17.2 s — AB (ref 11.4–15.2)

## 2016-08-09 LAB — CBC
HEMATOCRIT: 19.8 % — AB (ref 39.0–52.0)
HEMOGLOBIN: 6.8 g/dL — AB (ref 13.0–17.0)
MCH: 33.7 pg (ref 26.0–34.0)
MCHC: 34.3 g/dL (ref 30.0–36.0)
MCV: 98 fL (ref 78.0–100.0)
Platelets: 268 10*3/uL (ref 150–400)
RBC: 2.02 MIL/uL — AB (ref 4.22–5.81)
RDW: 13.9 % (ref 11.5–15.5)
WBC: 14.5 10*3/uL — AB (ref 4.0–10.5)

## 2016-08-09 LAB — HEPARIN LEVEL (UNFRACTIONATED): Heparin Unfractionated: 0.43 IU/mL (ref 0.30–0.70)

## 2016-08-09 LAB — PREPARE RBC (CROSSMATCH)

## 2016-08-09 LAB — GLUCOSE, CAPILLARY
GLUCOSE-CAPILLARY: 147 mg/dL — AB (ref 65–99)
GLUCOSE-CAPILLARY: 128 mg/dL — AB (ref 65–99)
GLUCOSE-CAPILLARY: 127 mg/dL — AB (ref 65–99)
Glucose-Capillary: 136 mg/dL — ABNORMAL HIGH (ref 65–99)

## 2016-08-09 LAB — HEMOGLOBIN AND HEMATOCRIT, BLOOD
HCT: 22.8 % — ABNORMAL LOW (ref 39.0–52.0)
HEMOGLOBIN: 8.2 g/dL — AB (ref 13.0–17.0)

## 2016-08-09 LAB — FIBRINOGEN: FIBRINOGEN: 419 mg/dL (ref 210–475)

## 2016-08-09 LAB — APTT: aPTT: 33 seconds (ref 24–36)

## 2016-08-09 MED ORDER — SODIUM CHLORIDE 0.9 % IV SOLN: Freq: Once | INTRAVENOUS | Status: DC

## 2016-08-09 MED ORDER — SPIRONOLACTONE 25 MG PO TABS
12.5000 mg | ORAL_TABLET | Freq: Every day | ORAL | Status: DC
  Administered 2016-08-09 – 2016-08-10 (×2): 12.5 mg via ORAL
  Filled 2016-08-09: qty 1

## 2016-08-09 MED ORDER — SODIUM CHLORIDE 0.9% FLUSH
10.0000 mL | INTRAVENOUS | Status: DC | PRN
  Administered 2016-08-09: 10 mL
  Filled 2016-08-09: qty 40

## 2016-08-09 MED ORDER — POTASSIUM CHLORIDE CRYS ER 20 MEQ PO TBCR
40.0000 meq | EXTENDED_RELEASE_TABLET | Freq: Once | ORAL | Status: AC
  Administered 2016-08-09: 40 meq via ORAL
  Filled 2016-08-09: qty 2

## 2016-08-09 MED ORDER — CARVEDILOL 3.125 MG PO TABS
3.1250 mg | ORAL_TABLET | Freq: Two times a day (BID) | ORAL | Status: DC
  Administered 2016-08-09 – 2016-08-11 (×4): 3.125 mg via ORAL
  Filled 2016-08-09 (×4): qty 1

## 2016-08-09 MED ORDER — FUROSEMIDE 40 MG PO TABS
40.0000 mg | ORAL_TABLET | Freq: Every day | ORAL | Status: DC
  Administered 2016-08-09 – 2016-08-14 (×5): 40 mg via ORAL
  Filled 2016-08-09 (×5): qty 1

## 2016-08-09 NOTE — Progress Notes (Signed)
[facility] Progress Note 5 Days Post-Op  Subjective: Patient asleep on arrival.  No distress.  Very painful left chest wall  Objective: Vital signs in last 24 hours: Temp:  [97.6 F (36.4 C)-98 F (36.7 C)] 97.6 F (36.4 C) (07/28 0500) Pulse Rate:  [93-115] 93 (07/28 0500) Resp:  [16-18] 17 (07/28 0500) BP: (128-150)/(62-82) 138/76 (07/28 0500) SpO2:  [93 %-98 %] 93 % (07/28 0500) Weight:  [60.3 kg (133 lb)] 60.3 kg (133 lb) (07/28 0500) Last BM Date:  (pta)  Intake/Output from previous day: 07/27 0701 - 07/28 0700 In: 480 [I.V.:180; IV Piggyback:300] Out: 1370 [Urine:1345; Drains:25] Intake/Output this shift: No intake/output data recorded.  General: Swollen left chest wall with a very tense hematoma  Lungs: Clear  Abd: Great bowel sounds.  Dessicated midline wound, needs wet to dry dressings.  Extremities: No clinical signs or symptoms of DVT  Neuro: Intact  Lab Results:  @LABLAST2 (wbc:2,hgb:2,hct:2,plt:2) BMET ) Recent Labs  08/08/16 0425 08/09/16 0526  NA 139 139  K 3.2* 3.6  CL 107 106  CO2 23 23  GLUCOSE 101* 134*  BUN 13 24*  CREATININE 0.79 0.93  CALCIUM 7.9* 7.9*   PT/INR No results for input(s): LABPROT, INR in the last 72 hours. ABG No results for input(s): PHART, HCO3 in the last 72 hours.  Invalid input(s): PCO2, PO2  Studies/Results: Ct Chest Wo Contrast  Result Date: 08/08/2016 CLINICAL DATA:  Left upper chest wall edema. EXAM: CT CHEST WITHOUT CONTRAST TECHNIQUE: Multidetector CT imaging of the chest was performed following the standard protocol without IV contrast. COMPARISON:  CT scan of August 04, 2016. FINDINGS: Cardiovascular: Atherosclerosis of thoracic aorta is noted without aneurysm formation. Coronary artery calcifications are noted. Minimal pericardial effusion is noted. Mediastinum/Nodes: No enlarged mediastinal or axillary lymph nodes. Thyroid gland, trachea, and esophagus demonstrate no significant findings. Lungs/Pleura: No  pneumothorax is noted. Moderate bilateral pleural effusions are noted with adjacent subsegmental atelectasis of the lower lobes. Upper Abdomen: Surgical drain is seen superior to the stomach. Musculoskeletal: Mildly displaced anterior left second rib fracture is noted. Stable probable nondisplaced fracture involving T8 vertebral body. There is interval development of probable large hematoma measuring 18 x 14 x 8 mm along the anterior portion of the left chest wall. IMPRESSION: Coronary artery calcifications are noted suggesting coronary artery disease. Moderate bilateral pleural effusions are noted with adjacent subsegmental atelectasis of both lower lobes. Mildly displaced anterior left second rib fracture. Stable probable nondisplaced fracture involving T8 vertebral body. Interval development of large hematoma along the anterior portion of the left chest wall. Aortic Atherosclerosis (ICD10-I70.0). Electronically Signed   By: Marijo Conception, M.D.   On: 08/08/2016 17:44   Dg Chest Portable 1 View  Result Date: 08/08/2016 CLINICAL DATA:  Respiratory failure EXAM: PORTABLE CHEST 1 VIEW COMPARISON:  08/07/2016 FINDINGS: Left central line remains in place, unchanged. Left lower lobe atelectasis or infiltrate is stable. Heart is borderline in size. IMPRESSION: No significant change since prior study. Electronically Signed   By: Rolm Baptise M.D.   On: 08/08/2016 07:41   Dg Chest Port 1 View  Result Date: 08/07/2016 CLINICAL DATA:  Status post central line placement EXAM: PORTABLE CHEST 1 VIEW COMPARISON:  08/06/2016 FINDINGS: Cardiac shadow is stable. Left central venous line is noted with catheter tip in the proximal SVC. The catheter is looped just below the skin entry site. The lungs are clear. Endotracheal tube and nasogastric catheter have been removed. Persistent left retrocardiac density is noted. No other  focal acute abnormality is seen. IMPRESSION: Central line looped under the skin surface although the  catheter tip remains in the proximal SVC. Electronically Signed   By: Inez Catalina M.D.   On: 08/07/2016 21:19   Dg Duanne Limerick W/water Sol Cm  Result Date: 08/08/2016 CLINICAL DATA:  Perforated esophageal surgery EXAM: WATER SOLUBLE UPPER GI SERIES TECHNIQUE: Single-column upper GI series was performed using water soluble contrast. CONTRAST:  100 cc of Isovue-300 COMPARISON:  None. FLUOROSCOPY TIME:  Fluoroscopy Time:  1 minutes 48 Number of Acquired Spot Images: 1 FINDINGS: Contrast was ingested in easily passed through the esophagus and into the stomach. With the patient in the right lateral orientation the stomach passed from the proximal stomach into the duodenum. Contrast was followed through the duodenum and beyond the level of the ligament of Treitz. No extravasation of contrast material identified. IMPRESSION: 1. No extravasation of contrast material identified to suggest leakage. Electronically Signed   By: Kerby Moors M.D.   On: 08/08/2016 09:05    Anti-infectives: Anti-infectives    Start     Dose/Rate Route Frequency Ordered Stop   08/06/16 1200  fluconazole (DIFLUCAN) IVPB 400 mg     400 mg 100 mL/hr over 120 Minutes Intravenous Every 24 hours 08/06/16 0959 08/11/16 1159   08/06/16 1100  Ampicillin-Sulbactam (UNASYN) 3 g in sodium chloride 0.9 % 100 mL IVPB     3 g 200 mL/hr over 30 Minutes Intravenous Every 6 hours 08/06/16 0959     08/05/16 1730  anidulafungin (ERAXIS) 100 mg in sodium chloride 0.9 % 100 mL IVPB  Status:  Discontinued     100 mg 78 mL/hr over 100 Minutes Intravenous Every 24 hours 08/04/16 1657 08/06/16 0959   08/05/16 0000  piperacillin-tazobactam (ZOSYN) IVPB 3.375 g  Status:  Discontinued     3.375 g 12.5 mL/hr over 240 Minutes Intravenous Every 8 hours 08/04/16 1717 08/06/16 0959   08/04/16 1800  piperacillin-tazobactam (ZOSYN) IVPB 3.375 g  Status:  Discontinued     3.375 g 100 mL/hr over 30 Minutes Intravenous To Surgery 08/04/16 1754 08/04/16 1848    08/04/16 1730  anidulafungin (ERAXIS) 200 mg in sodium chloride 0.9 % 200 mL IVPB     200 mg 78 mL/hr over 200 Minutes Intravenous  Once 08/04/16 1657 08/05/16 0222   08/04/16 1700  piperacillin-tazobactam (ZOSYN) IVPB 3.375 g  Status:  Discontinued     3.375 g 100 mL/hr over 30 Minutes Intravenous  Once 08/04/16 1655 08/06/16 0959      Assessment/Plan: s/p Procedure(s): EXPLORATORY LAPAROTOMY FREE AIR BIOPSY OF PERFORATED STOMACH ULCER REPAIR OF PERFORATED STOMACH ULCER WITH A OMENTAL PATCH Advance diet transfuse two units of blood.  PT/OT Dressing changes for midline wound   LOS: 5 days   Kathryne Eriksson. Dahlia Bailiff, MD, FACS [phone] [facility] 08/09/2016

## 2016-08-09 NOTE — Progress Notes (Signed)
ANTICOAGULATION CONSULT NOTE - Follow Up Consult  Pharmacy Consult for heparin Indication: LV thrombus  Labs:  Recent Labs  08/07/16 0232  08/07/16 1505 08/08/16 0425 08/08/16 0612 08/09/16 0526  HGB 11.0*  --   --  9.5*  --  6.8*  HCT 32.2*  --   --  27.3*  --  19.8*  PLT 203  --   --  199  --  268  HEPARINUNFRC  --   < > 0.68  --  0.38 0.43  CREATININE 0.79  --   --  0.79  --  0.93  < > = values in this interval not displayed.  Assessment: 65yoM was on heparin drip for LV thrombus. He was found on the floor 7/27 and CT shows  large hematoma along the anterior portion of the left chest wall. Heparin has been stopped -Hg 9.5>>6.8  Goal of Therapy:  Heparin level 0.3-0.7 units/ml   Plan:  -No further heparin at this time -Will follow patient progress  Hildred Laser, Pharm D 08/09/2016 11:01 AM

## 2016-08-09 NOTE — Consult Note (Signed)
[address]       [place] [address]             [phone]        [name] [name] [place] Medical Record #[id number] Date of Birth: 04/11/1950  Referring: No ref. provider found Primary Care: No primary care provider on file.  Chief Complaint:    Chief Complaint  Patient presents with  . Loss of Consciousness    History of Present Illness:      This is a 66 year old male with a PMH of hypertension and hyperlipidemia who presented to [facility] by EMS after he collapsed at [facility] on 08/04/2016. CPR was performed and he was shocked once by an AED before EMS arrived. On arrival, he was ST in the 120s.  In the ED, he was unresponsive therefore was intubated for airway protection. Cardiology was consulted but believed there was no acute cardiac event on EKG. They did not recommend cardiac catheterization.  His abdomen was firm and distended on arrival and a CT of the abdomen/pelvis showed massive pneumoperitoneum. Surgery was consulted and performed an exploratory laparotomy and repair of perforated stomach with an omental patch on 08/04/2016.  On 7/25 an echocardiogram was obtained which showed an LV apical thrombus. Heparin gtt was initated. A CT scan was obtained yesterday 7/27 which showed interval development of a large chest wall hematoma. We were consulted for possible surgical intervention/recommendations.   Of note wife states that he did have a heart attack back in 1999 but she does not know if a stent was placed at that time.    Current Activity/ Functional Status: Patient was independent with mobility/ambulation, transfers, ADL's, IADL's.   Zubrod Score: At the time of surgery this patient's most appropriate activity status/level should be described as: [x]     0    Normal activity, no symptoms []     1    Restricted in physical strenuous activity but ambulatory, able to do out light work []     2    Ambulatory and capable of self care, unable to do work  activities, up and about                 more than 50%  Of the time                            []     3    Only limited self care, in bed greater than 50% of waking hours []     4    Completely disabled, no self care, confined to bed or chair []     5    Moribund  History reviewed. No pertinent past medical history.  Past Surgical History:  Procedure Laterality Date  . CORONARY ANGIOPLASTY     "Dr. Vidal Schwalbe"  . LAPAROTOMY N/A 08/04/2016   Procedure: EXPLORATORY LAPAROTOMY FREE AIR BIOPSY OF PERFORATED STOMACH ULCER REPAIR OF PERFORATED STOMACH ULCER WITH A OMENTAL PATCH;  Surgeon: Georganna Skeans, MD;  Location: [facility];  Service: General;  Laterality: N/A;    History  Smoking Status  . Current Every Day Smoker  . Packs/day: 1.00  . Years: 25.00  . Types: Cigarettes  Smokeless Tobacco  . Former Systems developer  . Types: Chew    History  Alcohol use Not on file    Social History   Social History  . Marital status: Married    Spouse name: N/A  .  Number of children: N/A  . Years of education: N/A   Occupational History  . Not on file.   Social History Main Topics  . Smoking status: Current Every Day Smoker    Packs/day: 1.00    Years: 25.00    Types: Cigarettes  . Smokeless tobacco: Former Systems developer    Types: Chew  . Alcohol use Not on file  . Drug use: No  . Sexual activity: Not Currently   Other Topics Concern  . Not on file   Social History Narrative  . No narrative on file    No Known Allergies  Current Facility-Administered Medications  Medication Dose Route Frequency Provider Last Rate Last Dose  . 0.9 %  sodium chloride infusion   Intravenous Continuous Raylene Miyamoto, MD 10 mL/hr at 08/07/16 0600    . 0.9 %  sodium chloride infusion  250 mL Intravenous PRN Shearon Stalls, Rahul P, PA-C      . 0.9 %  sodium chloride infusion   Intravenous Once Florencia Reasons, MD      . 0.9 %  sodium chloride infusion   Intravenous Once Florencia Reasons, MD      . 0.9 %  sodium chloride infusion    Intravenous Once Judeth Horn, MD      . Ampicillin-Sulbactam (UNASYN) 3 g in sodium chloride 0.9 % 100 mL IVPB  3 g Intravenous Q6H Raylene Miyamoto, MD 200 mL/hr at 08/09/16 0500 3 g at 08/09/16 0500  . Chlorhexidine Gluconate Cloth 2 % PADS 6 each  6 each Topical Daily Raylene Miyamoto, MD   6 each at 08/08/16 1205  . fentaNYL (SUBLIMAZE) injection 50 mcg  50 mcg Intravenous Q2H PRN Shearon Stalls, Rahul P, PA-C   50 mcg at 08/07/16 0336  . fluconazole (DIFLUCAN) IVPB 400 mg  400 mg Intravenous Q24H Raylene Miyamoto, MD   Stopped at 08/08/16 1431  . folic acid (FOLVITE) tablet 1 mg  1 mg Oral Daily Florencia Reasons, MD   1 mg at 08/08/16 2105  . folic acid injection 1 mg  1 mg Intravenous Daily Desai, Rahul P, PA-C   1 mg at 08/07/16 1002  . furosemide (LASIX) injection 20 mg  20 mg Intravenous BID Raylene Miyamoto, MD   20 mg at 08/08/16 2100  . LORazepam (ATIVAN) tablet 1 mg  1 mg Oral Q6H PRN Florencia Reasons, MD       Or  . LORazepam (ATIVAN) injection 1 mg  1 mg Intravenous Q6H PRN Florencia Reasons, MD   1 mg at 08/09/16 0029  . multivitamin with minerals tablet 1 tablet  1 tablet Oral Daily Florencia Reasons, MD   1 tablet at 08/08/16 2105  . pantoprazole (PROTONIX) injection 40 mg  40 mg Intravenous Q12H Florencia Reasons, MD   40 mg at 08/08/16 2105  . pneumococcal 23 valent vaccine (PNU-IMMUNE) injection 0.5 mL  0.5 mL Intramuscular Tomorrow-1000 Raylene Miyamoto, MD      . sodium chloride flush (NS) 0.9 % injection 10-40 mL  10-40 mL Intracatheter Q12H Raylene Miyamoto, MD   10 mL at 08/07/16 2213  . sodium chloride flush (NS) 0.9 % injection 10-40 mL  10-40 mL Intracatheter PRN Raylene Miyamoto, MD   10 mL at 08/07/16 1606  . thiamine (B-1) injection 100 mg  100 mg Intravenous Daily Desai, Rahul P, PA-C   100 mg at 08/08/16 1207    Prescriptions Prior to Admission  Medication Sig Dispense Refill Last Dose  .  losartan (COZAAR) 100 MG tablet Take 100 mg by mouth daily.   08/04/2016 at Unknown time  . metoprolol  succinate (TOPROL-XL) 50 MG 24 hr tablet Take 50 mg by mouth 2 (two) times daily.   08/04/2016 at 1200  . rosuvastatin (CRESTOR) 10 MG tablet Take 10 mg by mouth daily.   08/04/2016 at Unknown time    History reviewed. No pertinent family history.   Review of Systems:  Pertinent items are noted in HPI.     Cardiac Review of Systems: Y or N  Chest Pain [ n   ]  Resting SOB [ n  ] Exertional SOB  [ n ]  Orthopnea [  ]   Pedal Edema [   ]    Palpitations [  ] Syncope  [ y ]   Presyncope [   ]  General Review of Systems: [Y] = yes [  ]=no Constitional: recent weight change [  ]; anorexia [  ]; fatigue [ y ]; nausea [  ]; night sweats [  ]; fever [ n ]; or chills [  ]                                                               Dental: poor dentition[  ]; Last Dentist visit:   Eye : blurred vision [  ]; diplopia [   ]; vision changes [  ];  Amaurosis fugax[  ]; Resp: cough [n  ];  wheezing[n  ];  hemoptysis[  ]; shortness of breath[n  ]; paroxysmal nocturnal dyspnea[  ]; dyspnea on exertion[  ]; or orthopnea[  ];  GI:  gallstones[  ], vomiting[n ];  dysphagia[  ]; melena[  ];  hematochezia [  ]; heartburn[  ];   Hx of  Colonoscopy[  ]; GU: kidney stones [  ]; hematuria[  ];   dysuria [  ];  nocturia[  ];  history of obstruction [n  ]; urinary frequency [  ]             Skin: rash, swelling[  ];, hair loss[  ];  peripheral edema[  ];  or itching[  ]; Musculosketetal: myalgias[  ];  joint swelling[  ];  joint erythema[  ];  joint pain[  ];  back pain[  ];  Heme/Lymph: bruising[ y ];  bleeding[y ];  anemia[  ];  Neuro: TIA[  ];  headaches[  ];  stroke[ n ];  vertigo[  ];  seizures[  ];   paresthesias[  ];  difficulty walking[  ];  Psych:depression[ n ]; anxiety[ n ];  Endocrine: diabetes[n  ];  thyroid dysfunction[  ];  Immunizations: Flu [  ]; Pneumococcal[  ];  Other: ROS limited by patient's mental state  Physical Exam: BP 138/76 (BP Location: Right Arm)   Pulse 93   Temp 97.6 F (36.4 C)  (Axillary)   Resp 17   Ht 5\' 8"  (1.727 m)   Wt 60.3 kg (133 lb)   SpO2 93%   BMI 20.22 kg/m    General appearance: delirious, left chest wall hematoma  Resp: clear to auscultation bilaterally Cardio: regular rate and rhythm, S1, S2 normal, no murmur, click, rub or gallop Extremities: extremities normal, atraumatic, no cyanosis or edema Neurologic:  in  and out of delirum  Diagnostic Studies & Laboratory data:     Recent Radiology Findings:   Ct Chest Wo Contrast  Result Date: 08/08/2016 CLINICAL DATA:  Left upper chest wall edema. EXAM: CT CHEST WITHOUT CONTRAST TECHNIQUE: Multidetector CT imaging of the chest was performed following the standard protocol without IV contrast. COMPARISON:  CT scan of August 04, 2016. FINDINGS: Cardiovascular: Atherosclerosis of thoracic aorta is noted without aneurysm formation. Coronary artery calcifications are noted. Minimal pericardial effusion is noted. Mediastinum/Nodes: No enlarged mediastinal or axillary lymph nodes. Thyroid gland, trachea, and esophagus demonstrate no significant findings. Lungs/Pleura: No pneumothorax is noted. Moderate bilateral pleural effusions are noted with adjacent subsegmental atelectasis of the lower lobes. Upper Abdomen: Surgical drain is seen superior to the stomach. Musculoskeletal: Mildly displaced anterior left second rib fracture is noted. Stable probable nondisplaced fracture involving T8 vertebral body. There is interval development of probable large hematoma measuring 18 x 14 x 8 mm along the anterior portion of the left chest wall. IMPRESSION: Coronary artery calcifications are noted suggesting coronary artery disease. Moderate bilateral pleural effusions are noted with adjacent subsegmental atelectasis of both lower lobes. Mildly displaced anterior left second rib fracture. Stable probable nondisplaced fracture involving T8 vertebral body. Interval development of large hematoma along the anterior portion of the left chest  wall. Aortic Atherosclerosis (ICD10-I70.0). Electronically Signed   By: Marijo Conception, M.D.   On: 08/08/2016 17:44   Dg Chest Portable 1 View  Result Date: 08/08/2016 CLINICAL DATA:  Respiratory failure EXAM: PORTABLE CHEST 1 VIEW COMPARISON:  08/07/2016 FINDINGS: Left central line remains in place, unchanged. Left lower lobe atelectasis or infiltrate is stable. Heart is borderline in size. IMPRESSION: No significant change since prior study. Electronically Signed   By: Rolm Baptise M.D.   On: 08/08/2016 07:41   Dg Chest Port 1 View  Result Date: 08/07/2016 CLINICAL DATA:  Status post central line placement EXAM: PORTABLE CHEST 1 VIEW COMPARISON:  08/06/2016 FINDINGS: Cardiac shadow is stable. Left central venous line is noted with catheter tip in the proximal SVC. The catheter is looped just below the skin entry site. The lungs are clear. Endotracheal tube and nasogastric catheter have been removed. Persistent left retrocardiac density is noted. No other focal acute abnormality is seen. IMPRESSION: Central line looped under the skin surface although the catheter tip remains in the proximal SVC. Electronically Signed   By: Inez Catalina M.D.   On: 08/07/2016 21:19   Dg Duanne Limerick W/water Sol Cm  Result Date: 08/08/2016 CLINICAL DATA:  Perforated esophageal surgery EXAM: WATER SOLUBLE UPPER GI SERIES TECHNIQUE: Single-column upper GI series was performed using water soluble contrast. CONTRAST:  100 cc of Isovue-300 COMPARISON:  None. FLUOROSCOPY TIME:  Fluoroscopy Time:  1 minutes 48 Number of Acquired Spot Images: 1 FINDINGS: Contrast was ingested in easily passed through the esophagus and into the stomach. With the patient in the right lateral orientation the stomach passed from the proximal stomach into the duodenum. Contrast was followed through the duodenum and beyond the level of the ligament of Treitz. No extravasation of contrast material identified. IMPRESSION: 1. No extravasation of contrast material  identified to suggest leakage. Electronically Signed   By: Kerby Moors M.D.   On: 08/08/2016 09:05     I have independently reviewed the above radiologic studies.  Recent Lab Findings: Lab Results  Component Value Date   WBC 14.5 (H) 08/09/2016   HGB 6.8 (LL) 08/09/2016   HCT 19.8 (L) 08/09/2016  PLT 268 08/09/2016   GLUCOSE 134 (H) 08/09/2016   TRIG 88 08/04/2016   ALT 19 08/06/2016   AST 56 (H) 08/06/2016   NA 139 08/09/2016   K 3.6 08/09/2016   CL 106 08/09/2016   CREATININE 0.93 08/09/2016   BUN 24 (H) 08/09/2016   CO2 23 08/09/2016      Assessment / Plan:   Current recommendations are ice therapy and discontinuation of heparin gtt. No surgical intervention at this time. The patient was discussed with Dr. Prescott Gum and the CT scan was reviewed. Will order coagulation profile.      I  spent 30 minutes counseling the patient face to face and 50% or more the  time was spent in counseling and coordination of care. The total time spent in the appointment was 55 minutes.    Nicholes Rough, PA-C  08/09/2016 11:11 AM Patient examined and CT chest images reviewed Left anterior chest wall hematoma from L rib fractures [CPR] and high dose heparin No blood within the pleural space No surgery at this time- will follow. May need to aspirate when clot lysis occurs- no general anesthesia due to recent arrest, cerebral anoxia Stop heparin May resume 81 asa in 3 days P Prescott Gum MD

## 2016-08-09 NOTE — Progress Notes (Addendum)
PROGRESS NOTE  Odies Desa PYK:[id number] DOB: 31-Jan-1950 DOA: 08/04/2016 PCP: No primary care provider on file.  HPI/Recap of past 24 hours:  Left chest wall hematoma is expanding, hgb dropping,  He is confused, not oriented, states it is 1974, he is in a spaceship No fever, has sinus tachycardia, bp stable Wife at bedside  Assessment/Plan: Active Problems:   Pneumoperitoneum   Syncope and collapse   Acute respiratory failure (HCC)    Cardiac arrest (presenting symptom):  -Out of hospital VF/VT arrest , witness - unclear etiology. Required 1 shock and 2 - 3 minutes CPR before ROSC. 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 -out of hospital/ witness/ not able to have hypothermia protocol due to perforated viscus -He was intubated and admitted to icu,  -He has improved , extubated and transferred to hospitalist service on 7/27  LV thrombus:  heparin drip held on 7/28 due to expanding large left chest wall hematoma Cardiology consulted, cardiac work up once surgical issue for perforated viscus imporves.  Systolic CHF  -Echo on 5/39 lvef 35-40% with grade 1 diastolic dysfunction -Ct chest on 7/28 "Moderate bilateral pleural effusions are noted with adjacent subsegmental atelectasis of both lower lobes. 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  ICU delirium? He also has h/o alcohol use Start ciwa protocol Sitter in room  Code Status: full  Family Communication: patient and wife at  bedside  Disposition Plan: not ready for discharge   Consultants:  [facility]ccm admit, transferred to [facility] on 7/27  General surgery  cardiology   Thoracic surgery Dr Lucianne Lei trigt  Procedures:  Intubation/extubation EXPLORATORY LAPAROTOMY FREE AIR BIOPSY OF PERFORATED STOMACH ULCER REPAIR OF PERFORATED STOMACH ULCER WITH A OMENTAL PATCH (N/A)  Antibiotics:  unasyn/diflucan   Objective: BP 138/76 (BP Location: Right Arm)   Pulse 93   Temp 97.6 F (36.4 C) (Axillary)   Resp 17   Ht 5\' 8"  (1.727 m)   Wt 60.3 kg (133 lb)   SpO2 93%   BMI 20.22 kg/m   Intake/Output Summary (Last 24 hours) at 08/09/16 0812 Last data filed at 08/09/16 0511  Gross per 24 hour  Intake              480 ml  Output             1250 ml  Net             -770 ml   Filed Weights   08/07/16 1319 08/08/16 0350 08/09/16 0500  Weight: 62.3 kg (137 lb 4.8 oz) 61.6 kg (135 lb 11.2 oz) 60.3 kg (133 lb)    Exam:   General:  More confused today, left chest wall expanding hematoma  Cardiovascular: sinus tachycardia  Respiratory: diminished at basis, no wheezing, no rhonchi  Abdomen: potst op changes, dressing intact, soft, nondistended, drain in place with serosanguinous fluids, positive BS  Musculoskeletal: No Edema  Neuro: confused  Data Reviewed: Basic Metabolic Panel:  Recent Labs Lab 08/05/16 0247 08/05/16 0930 08/05/16 1539 08/06/16 0408 08/07/16 0232 08/08/16 0425 08/09/16 0526  NA 137 138  --  136 138 139 139  K 4.2 4.2  --  3.7 3.8 3.2* 3.6  CL 112* 110  --  109 107 107 106  CO2 22 21*  --  22 24 23 23   GLUCOSE 148* 129*  --  112* 84 101* 134*  BUN 9 12  --  11 10 13  24*  CREATININE 0.82 0.81  --  0.73 0.79 0.79 0.93  CALCIUM 8.2* 8.0*  --  7.9* 7.8* 7.9* 7.9*  MG 1.1* 1.2* 2.0 2.4 1.7  --  2.3  PHOS 1.3*  --   --  2.8 2.4*  --   --    Liver Function Tests:  Recent Labs Lab 08/05/16 0930 08/06/16 0408  AST 56* 56*  ALT 19 19  ALKPHOS 30* 24*  BILITOT  1.5* 1.3*  PROT 4.9* 4.5*  ALBUMIN 3.0* 2.6*   No results for input(s): LIPASE, AMYLASE in the last 168 hours. No results for input(s): AMMONIA in the last 168 hours. CBC:  Recent Labs Lab 08/04/16 1557  08/05/16 0930 08/06/16 0408 08/07/16 0232 08/08/16 0425 08/09/16 0526  WBC 17.6*  < > 16.2* 10.8* 10.3 8.3 14.5*  NEUTROABS 9.8*  --   --   --   --   --   --   HGB 15.7  < > 13.4 11.7* 11.0* 9.5* 6.8*  HCT 45.1  < > 38.8* 34.3* 32.2* 27.3* 19.8*  MCV 100.7*  < > 100.3* 100.0 99.7 97.8 98.0  PLT 378  < > 261 217 203 199 268  < > = values in this interval not displayed. Cardiac Enzymes:    Recent Labs Lab 08/04/16 2150 08/05/16 0247  TROPONINI 1.01* 1.03*   BNP (last 3 results) No results for input(s): BNP in the last 8760 hours.  ProBNP (last 3 results) No results for input(s): PROBNP in the last 8760 hours.  CBG:  Recent Labs Lab 08/04/16 1546 08/05/16 1147 08/08/16 2132 08/09/16 0730  GLUCAP 142* 106* 159* 147*    Recent Results (from the past 240 hour(s))  MRSA PCR Screening     Status: None   Collection Time: 08/04/16  7:03 PM  Result Value Ref Range Status   MRSA by PCR NEGATIVE NEGATIVE Final    Comment:        The GeneXpert MRSA Assay (FDA approved for NASAL specimens only), is one component of a comprehensive MRSA colonization surveillance program. It is not intended to diagnose MRSA infection nor to guide or monitor treatment for MRSA infections.   Culture, blood (Routine X 2) w Reflex to ID Panel     Status: None (Preliminary result)   Collection Time: 08/04/16  7:30 PM  Result Value Ref Range Status   Specimen Description BLOOD LEFT HAND  Final   Special Requests IN PEDIATRIC BOTTLE Blood Culture adequate volume  Final   Culture NO GROWTH 4 DAYS  Final   Report Status PENDING  Incomplete  Culture, blood (Routine X 2) w Reflex to ID Panel     Status: None (Preliminary result)   Collection Time: 08/04/16  7:42 PM  Result Value Ref Range  Status   Specimen Description BLOOD LEFT WRIST  Final   Special Requests IN PEDIATRIC BOTTLE Blood Culture adequate volume  Final   Culture NO GROWTH 4 DAYS  Final   Report Status PENDING  Incomplete     Studies: Ct Chest Wo Contrast  Result Date: 08/08/2016 CLINICAL DATA:  Left upper chest wall edema. EXAM: CT CHEST WITHOUT  CONTRAST TECHNIQUE: Multidetector CT imaging of the chest was performed following the standard protocol without IV contrast. COMPARISON:  CT scan of August 04, 2016. FINDINGS: Cardiovascular: Atherosclerosis of thoracic aorta is noted without aneurysm formation. Coronary artery calcifications are noted. Minimal pericardial effusion is noted. Mediastinum/Nodes: No enlarged mediastinal or axillary lymph nodes. Thyroid gland, trachea, and esophagus demonstrate no significant findings. Lungs/Pleura: No pneumothorax is noted. Moderate bilateral pleural effusions are noted with adjacent subsegmental atelectasis of the lower lobes. Upper Abdomen: Surgical drain is seen superior to the stomach. Musculoskeletal: Mildly displaced anterior left second rib fracture is noted. Stable probable nondisplaced fracture involving T8 vertebral body. There is interval development of probable large hematoma measuring 18 x 14 x 8 mm along the anterior portion of the left chest wall. IMPRESSION: Coronary artery calcifications are noted suggesting coronary artery disease. Moderate bilateral pleural effusions are noted with adjacent subsegmental atelectasis of both lower lobes. Mildly displaced anterior left second rib fracture. Stable probable nondisplaced fracture involving T8 vertebral body. Interval development of large hematoma along the anterior portion of the left chest wall. Aortic Atherosclerosis (ICD10-I70.0). Electronically Signed   By: Marijo Conception, M.D.   On: 08/08/2016 17:44   Dg Duanne Limerick W/water Sol Cm  Result Date: 08/08/2016 CLINICAL DATA:  Perforated esophageal surgery EXAM: WATER SOLUBLE UPPER  GI SERIES TECHNIQUE: Single-column upper GI series was performed using water soluble contrast. CONTRAST:  100 cc of Isovue-300 COMPARISON:  None. FLUOROSCOPY TIME:  Fluoroscopy Time:  1 minutes 48 Number of Acquired Spot Images: 1 FINDINGS: Contrast was ingested in easily passed through the esophagus and into the stomach. With the patient in the right lateral orientation the stomach passed from the proximal stomach into the duodenum. Contrast was followed through the duodenum and beyond the level of the ligament of Treitz. No extravasation of contrast material identified. IMPRESSION: 1. No extravasation of contrast material identified to suggest leakage. Electronically Signed   By: Kerby Moors M.D.   On: 08/08/2016 09:05    Scheduled Meds: . Chlorhexidine Gluconate Cloth  6 each Topical Daily  . folic acid  1 mg Oral Daily  . folic acid  1 mg Intravenous Daily  . furosemide  20 mg Intravenous BID  . multivitamin with minerals  1 tablet Oral Daily  . pantoprazole (PROTONIX) IV  40 mg Intravenous Q12H  . pneumococcal 23 valent vaccine  0.5 mL Intramuscular Tomorrow-1000  . sodium chloride flush  10-40 mL Intracatheter Q12H  . thiamine  100 mg Intravenous Daily    Continuous Infusions: . sodium chloride 10 mL/hr at 08/07/16 0600  . sodium chloride    . sodium chloride    . ampicillin-sulbactam (UNASYN) IV 3 g (08/09/16 0500)  . fluconazole (DIFLUCAN) IV Stopped (08/08/16 1431)  . heparin 1,000 Units/hr (08/07/16 1720)     Time spent: 22mins  Ewa Hipp MD, PhD  [facility] Pager [phone]. If 7PM-7AM, please contact night-coverage at [email or web address], password [facility] 08/09/2016, 8:12 AM  LOS: 5 days

## 2016-08-09 NOTE — Plan of Care (Signed)
Problem: Safety: Goal: Ability to remain free from injury will improve Outcome: Not Progressing Safety sitter at bedside

## 2016-08-09 NOTE — Progress Notes (Signed)
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 Doreatha Lew but details unclear) Patient had witnessed arrest in front of a [employer] on 7/23, bystander got AED machine which reportedly delivered a shock. CPR had been performed. He was intubated on arrival to the ER to protect his airway. He was found to have perforated viscous on chest xray and had emergency exploratory laparotomy with repair of perforated stomach..We were consulted early in his care and ordered an echocardiogram which showed EF 35-40% with previous anterior MI (previous EF unknown) + LV thrombus and he was started on Heparin. This morning he was was found to have a significantly expanding large hematoma along the anterior portion of the left chest wall, aortic-- CT chest also showed atherosclerosis and mildly displaced anterior left second rib fracture as well as stable probable nondisplaced fracture involving T8 vertebral body. CT surgery consulted and recommends ICE and discontinue heparin and reversal agents were given.  On admit trop 1.0 and stayed flat. ECG with anterior Qs and no acute ST changes   Pt is awake, alert and talking. Mildly confused. Unable to provide further history. He has a large hematoma to chest wall.  Blood pressure (!) 115/54, pulse (!) 130, temperature 97.8 F (36.6 C), temperature source Oral, resp. rate 18, height 5\' 8"  (1.727 m), weight 133 lb (60.3 kg), SpO2 96 %.   Inpatient Medications    Scheduled Meds: . Chlorhexidine Gluconate Cloth  6 each Topical Daily  . folic acid  1 mg Oral Daily  . folic acid  1 mg Intravenous Daily  . furosemide  20 mg Intravenous BID  . multivitamin with minerals  1 tablet Oral Daily  . pantoprazole (PROTONIX) IV  40 mg Intravenous Q12H  . pneumococcal 23 valent vaccine  0.5 mL Intramuscular Tomorrow-1000  .  sodium chloride flush  10-40 mL Intracatheter Q12H  . thiamine  100 mg Intravenous Daily   Continuous Infusions: . sodium chloride 10 mL/hr at 08/07/16 0600  . sodium chloride    . sodium chloride    . sodium chloride    . sodium chloride    . ampicillin-sulbactam (UNASYN) IV 3 g (08/09/16 1208)  . fluconazole (DIFLUCAN) IV Stopped (08/08/16 1431)   PRN Meds: sodium chloride, fentaNYL (SUBLIMAZE) injection, LORazepam **OR** LORazepam, sodium chloride flush   Vital Signs    Vitals:   08/09/16 0500 08/09/16 1200 08/09/16 1304 08/09/16 1332  BP: 138/76 (!) 128/54 123/63 (!) 115/54  Pulse: 93 77 (!) 138 (!) 130  Resp: 17 17 15 18   Temp: 97.6 F (36.4 C) (!) 97.5 F (36.4 C) 98.6 F (37 C) 97.8 F (36.6 C)  TempSrc: Axillary Oral Axillary Oral  SpO2: 93% 97% 95% 96%  Weight: 133 lb (60.3 kg)     Height:        Intake/Output Summary (Last 24 hours) at 08/09/16 1452 Last data filed at 08/09/16 1404  Gross per 24 hour  Intake              810 ml  Output              450 ml  Net              360 ml   Filed Weights   08/07/16 1319 08/08/16 0350 08/09/16 0500  Weight: 137 lb 4.8 oz (  62.3 kg) 135 lb 11.2 oz (61.6 kg) 133 lb (60.3 kg)    Telemetry    Normal sinus rhythm with very frequent PVCs - Personally Reviewed   Physical Exam   GEN: Well nourished, well developed  Neck: no JVD  or masses Cardiac: RRR with frequent PVCs Respiratory: tenderness to chest wall with ecchymosis to bilateral chest wall. Large left upper chest wall hematoma that is firm and tender. GI: wound drain draining clear and blood mixed fluid. Surgical dressing is dry and clean. MS: no deformity or atrophy, no le swelling Skin: warm and dry, no rash Neuro: Alert. Some confusion regarding questions regarding the day he arrested. Psych:   Full affect  Labs    Chemistry Recent Labs Lab 08/05/16 0930 08/06/16 0408 08/07/16 0232 08/08/16 0425 08/09/16 0526  NA 138 136 138 139 139  K 4.2  3.7 3.8 3.2* 3.6  CL 110 109 107 107 106  CO2 21* 22 24 23 23   GLUCOSE 129* 112* 84 101* 134*  BUN 12 11 10 13  24*  CREATININE 0.81 0.73 0.79 0.79 0.93  CALCIUM 8.0* 7.9* 7.8* 7.9* 7.9*  PROT 4.9* 4.5*  --   --   --   ALBUMIN 3.0* 2.6*  --   --   --   AST 56* 56*  --   --   --   ALT 19 19  --   --   --   ALKPHOS 30* 24*  --   --   --   BILITOT 1.5* 1.3*  --   --   --   GFRNONAA >60 >60 >60 >60 >60  GFRAA >60 >60 >60 >60 >60  ANIONGAP 7 5 7 9 10      Hematology Recent Labs Lab 08/07/16 0232 08/08/16 0425 08/09/16 0526  WBC 10.3 8.3 14.5*  RBC 3.23* 2.79* 2.02*  HGB 11.0* 9.5* 6.8*  HCT 32.2* 27.3* 19.8*  MCV 99.7 97.8 98.0  MCH 34.1* 34.1* 33.7  MCHC 34.2 34.8 34.3  RDW 13.9 13.8 13.9  PLT 203 199 268    Cardiac Enzymes Recent Labs Lab 08/04/16 2150 08/05/16 0247  TROPONINI 1.01* 1.03*    Recent Labs Lab 08/04/16 1559  TROPIPOC 0.07      Radiology    Ct Chest Wo Contrast  Result Date: 08/08/2016 CLINICAL DATA:  Left upper chest wall edema. EXAM: CT CHEST WITHOUT CONTRAST TECHNIQUE: Multidetector CT imaging of the chest was performed following the standard protocol without IV contrast. COMPARISON:  CT scan of August 04, 2016. FINDINGS: Cardiovascular: Atherosclerosis of thoracic aorta is noted without aneurysm formation. Coronary artery calcifications are noted. Minimal pericardial effusion is noted. Mediastinum/Nodes: No enlarged mediastinal or axillary lymph nodes. Thyroid gland, trachea, and esophagus demonstrate no significant findings. Lungs/Pleura: No pneumothorax is noted. Moderate bilateral pleural effusions are noted with adjacent subsegmental atelectasis of the lower lobes. Upper Abdomen: Surgical drain is seen superior to the stomach. Musculoskeletal: Mildly displaced anterior left second rib fracture is noted. Stable probable nondisplaced fracture involving T8 vertebral body. There is interval development of probable large hematoma measuring 18 x 14 x 8 mm  along the anterior portion of the left chest wall. IMPRESSION: Coronary artery calcifications are noted suggesting coronary artery disease. Moderate bilateral pleural effusions are noted with adjacent subsegmental atelectasis of both lower lobes. Mildly displaced anterior left second rib fracture. Stable probable nondisplaced fracture involving T8 vertebral body. Interval development of large hematoma along the anterior portion of the left chest wall. Aortic Atherosclerosis (ICD10-I70.0).  Electronically Signed   By: Marijo Conception, M.D.   On: 08/08/2016 17:44   Dg Chest Portable 1 View  Result Date: 08/08/2016 CLINICAL DATA:  Respiratory failure EXAM: PORTABLE CHEST 1 VIEW COMPARISON:  08/07/2016 FINDINGS: Left central line remains in place, unchanged. Left lower lobe atelectasis or infiltrate is stable. Heart is borderline in size. IMPRESSION: No significant change since prior study. Electronically Signed   By: Rolm Baptise M.D.   On: 08/08/2016 07:41   Dg Chest Port 1 View  Result Date: 08/07/2016 CLINICAL DATA:  Status post central line placement EXAM: PORTABLE CHEST 1 VIEW COMPARISON:  08/06/2016 FINDINGS: Cardiac shadow is stable. Left central venous line is noted with catheter tip in the proximal SVC. The catheter is looped just below the skin entry site. The lungs are clear. Endotracheal tube and nasogastric catheter have been removed. Persistent left retrocardiac density is noted. No other focal acute abnormality is seen. IMPRESSION: Central line looped under the skin surface although the catheter tip remains in the proximal SVC. Electronically Signed   By: Inez Catalina M.D.   On: 08/07/2016 21:19   Dg Duanne Limerick W/water Sol Cm  Result Date: 08/08/2016 CLINICAL DATA:  Perforated esophageal surgery EXAM: WATER SOLUBLE UPPER GI SERIES TECHNIQUE: Single-column upper GI series was performed using water soluble contrast. CONTRAST:  100 cc of Isovue-300 COMPARISON:  None. FLUOROSCOPY TIME:  Fluoroscopy Time:   1 minutes 48 Number of Acquired Spot Images: 1 FINDINGS: Contrast was ingested in easily passed through the esophagus and into the stomach. With the patient in the right lateral orientation the stomach passed from the proximal stomach into the duodenum. Contrast was followed through the duodenum and beyond the level of the ligament of Treitz. No extravasation of contrast material identified. IMPRESSION: 1. No extravasation of contrast material identified to suggest leakage. Electronically Signed   By: Kerby Moors M.D.   On: 08/08/2016 09:05    Cardiac Studies   Transthoracic Echocardiogram  08/06/2016  Study Conclusions  - Left ventricle: The cavity size was normal. There was mild focal   basal hypertrophy of the septum. Systolic function was moderately   reduced. The estimated ejection fraction was in the range of 35%   to 40%. There is akinesis of the apical myocardium. There is   akinesis of the apicalanterior, lateral, inferolateral, and   inferior myocardium. There is akinesis of the midanteroseptal   myocardium. There was an increased relative contribution of   atrial contraction to ventricular filling. Doppler parameters are   consistent with abnormal left ventricular relaxation (grade 1   diastolic dysfunction). There was an apparent, medium-sized,   apicalthrombus. - Pulmonary arteries: Systolic pressure could not be accurately   estimated. - Pericardium, extracardiac: There was a large left pleural   effusion.   Patient Profile     Patient had witnessed arrest in front of a [employer] on 7/23, bystander got AED machine which reportedly delivered a shock. CPR had been performed. He was intubated on arrival to the ER to protect his airway. He was found to have perforated viscous on chest xray and had emergency exploratory laparotomy.  We were consulted earlier in his care and ordered an echocardiogram which showed LV thrombus and he was started on Heparin. We have been  continuing to follow. We have been asked to see patient again today as he was was found to have a significantly expanding large hematoma along the anterior portion of the left chest wall, aortic-- CT chest also  showed atherosclerosis and mildly displaced anterior left second rib fracture as well as stable probable nondisplaced fracture involving T8 vertebral body. CT surgery consulted and recommends ICE and discontinue heparin. He was given FFP per hospitalist.   Assessment & Plan     1. Cardiac ArrestVT/VF: Mr. Iden most likely had primary  VTach/Vtfib which caused his cardiac arrest in the setting of a low EF 35-40%. His viscous was most likely perforated during the resuscitation efforts/ CPR. It is less likely he had a perforated bowel and then arrested given he was out and about at [facility]. His cardiac arrest is not consistent with ACS He was intubated on arrival to ED and admitted to the ICU, he has since been extubated and transferred to hospitalist service on 7/27.  -- he has been having frequent PVCs he will likely need to see EP this admission and be given a life vest.  2. LV Thrombus: Heparin drip started for LV thrombus seen on this admission by echo. Unfortunately he has since developed an expanding chest wall hematoma with a drop in his hemoglobin. His heparin has been discontinued and he has been given reversal agents and blood transfusion.   3. Large chest wall hematoma:  CTSurgery has evaluated patient today and recommends ice, observation and dc of heparin.  Dr. Haroldine Laws applied a pressure dressing to hematoma and instructed RN to leave on so long as patient can olerate it.  --CTS following   4. Perforated stomach ulcer: Gen Surg and medicine managing, repaired on 7/23. Perforated stomach ulcer likely caused by CPR efforts.  5. Hypokalemia/hypomagnesemia: keep potassium > 4 and Magnesium > 2 Magnesium is 2.3 Potassium is 3.6, will order PO potassium  replacement.    Signed Linus Mako, PA-C  08/09/2016, 2:52 PM    Patient seen and examined with the above-signed Advanced Practice Provider and/or Housestaff. I personally reviewed laboratory data, imaging studies and relevant notes. I independently examined the patient and formulated the important aspects of the plan. I have edited the note to reflect any of my changes or salient points. I have personally discussed the plan with the patient and/or family.  Suspect primary event was VT/VF arrest in setting of pre-existing ischemic CM. No evidence of ACS by troponin or ECG. CPR c/b by perforated stomach requiring emergent repair.  Echo with EF 35-40% (suspect this is baseline) with apical thrombus. A/C complicated by large chest wall hematoma and heparin stopped. Seen by TCTS who recommended watchful waiting.   Currently confused on exam with no evidence of volume overload. Very frequent polymorphic PVCs on tele.   Given VT/VF arrest, ideally will need cardiac cath when he is more stable. Continue to follow on tele. Keep Mg > 2.0 and K > 4.0 . Start low-dose b-blocker. Will likely need LifeVest at time of d/c (will d/w EP). If PVCs don't improve with conservative measures may need amio. Add spiro. Start ACE/ARB/ARNI as tolerated.   I personally placed pressure dressing over left chest. Agree with holding AC for now.   Total time spent 45 minutes. Over half that time spent discussing above.   Glori Bickers, MD  4:47 PM

## 2016-08-10 DIAGNOSIS — K255 Chronic or unspecified gastric ulcer with perforation: Secondary | ICD-10-CM

## 2016-08-10 DIAGNOSIS — I5023 Acute on chronic systolic (congestive) heart failure: Secondary | ICD-10-CM

## 2016-08-10 LAB — COMPREHENSIVE METABOLIC PANEL
ALBUMIN: 2.2 g/dL — AB (ref 3.5–5.0)
ALK PHOS: 31 U/L — AB (ref 38–126)
ALT: 27 U/L (ref 17–63)
ANION GAP: 8 (ref 5–15)
AST: 58 U/L — ABNORMAL HIGH (ref 15–41)
BILIRUBIN TOTAL: 1.3 mg/dL — AB (ref 0.3–1.2)
BUN: 22 mg/dL — ABNORMAL HIGH (ref 6–20)
CALCIUM: 7.5 mg/dL — AB (ref 8.9–10.3)
CO2: 23 mmol/L (ref 22–32)
Chloride: 108 mmol/L (ref 101–111)
Creatinine, Ser: 0.85 mg/dL (ref 0.61–1.24)
GFR calc non Af Amer: >60 mL/min (ref >60)
Glucose, Bld: 106 mg/dL — ABNORMAL HIGH (ref 65–99)
POTASSIUM: 3 mmol/L — AB (ref 3.5–5.1)
Sodium: 139 mmol/L (ref 135–145)
TOTAL PROTEIN: 4.2 g/dL — AB (ref 6.5–8.1)

## 2016-08-10 LAB — CBC
HEMATOCRIT: 21.1 % — AB (ref 39.0–52.0)
HEMOGLOBIN: 7.5 g/dL — AB (ref 13.0–17.0)
MCH: 32.9 pg (ref 26.0–34.0)
MCHC: 35.5 g/dL (ref 30.0–36.0)
MCV: 92.5 fL (ref 78.0–100.0)
Platelets: 186 10*3/uL (ref 150–400)
RBC: 2.28 MIL/uL — ABNORMAL LOW (ref 4.22–5.81)
RDW: 16.7 % — AB (ref 11.5–15.5)
WBC: 10.1 10*3/uL (ref 4.0–10.5)

## 2016-08-10 LAB — PREPARE RBC (CROSSMATCH)

## 2016-08-10 LAB — MAGNESIUM: MAGNESIUM: 1.8 mg/dL (ref 1.7–2.4)

## 2016-08-10 MED ORDER — POTASSIUM CHLORIDE CRYS ER 20 MEQ PO TBCR
40.0000 meq | EXTENDED_RELEASE_TABLET | Freq: Once | ORAL | Status: AC
  Administered 2016-08-10: 40 meq via ORAL
  Filled 2016-08-10: qty 2

## 2016-08-10 MED ORDER — ATORVASTATIN CALCIUM 40 MG PO TABS: 40.0000 mg | ORAL_TABLET | Freq: Every day | ORAL | Status: DC

## 2016-08-10 MED ORDER — SODIUM CHLORIDE 0.9 % IV SOLN
Freq: Once | INTRAVENOUS | Status: AC
  Administered 2016-08-10: 13:00:00 via INTRAVENOUS

## 2016-08-10 MED ORDER — POTASSIUM CHLORIDE CRYS ER 20 MEQ PO TBCR
40.0000 meq | EXTENDED_RELEASE_TABLET | Freq: Once | ORAL | Status: AC
Start: 2016-08-10 — End: 2016-08-10
  Administered 2016-08-10: 40 meq via ORAL
  Filled 2016-08-10: qty 2

## 2016-08-10 MED ORDER — ROSUVASTATIN CALCIUM 10 MG PO TABS
10.0000 mg | ORAL_TABLET | Freq: Every day | ORAL | Status: DC
  Administered 2016-08-10 – 2016-08-14 (×5): 10 mg via ORAL
  Filled 2016-08-10 (×5): qty 1

## 2016-08-10 MED ORDER — SACUBITRIL-VALSARTAN 24-26 MG PO TABS
1.0000 | ORAL_TABLET | Freq: Two times a day (BID) | ORAL | Status: DC
  Administered 2016-08-10 – 2016-08-14 (×8): 1 via ORAL
  Filled 2016-08-10 (×9): qty 1

## 2016-08-10 MED ORDER — DOCUSATE SODIUM 100 MG PO CAPS
100.0000 mg | ORAL_CAPSULE | Freq: Two times a day (BID) | ORAL | Status: DC
  Administered 2016-08-11 – 2016-08-14 (×4): 100 mg via ORAL
  Filled 2016-08-10 (×7): qty 1

## 2016-08-10 MED ORDER — POTASSIUM CHLORIDE CRYS ER 20 MEQ PO TBCR: 40.0000 meq | EXTENDED_RELEASE_TABLET | Freq: Once | ORAL | Status: DC

## 2016-08-10 NOTE — Progress Notes (Addendum)
Progress Note  Patient Name: [name] [name] Date of Encounter: 08/10/2016  Primary Cardiologist: New  Subjective   Feels better today. Less confused.   + pleuritic chest pain. Tolerating diet. No dyspnea. Chest hematoma appears stable.    Inpatient Medications    Scheduled Meds: . carvedilol  3.125 mg Oral BID WC  . Chlorhexidine Gluconate Cloth  6 each Topical Daily  . docusate sodium  100 mg Oral BID  . folic acid  1 mg Oral Daily  . folic acid  1 mg Intravenous Daily  . furosemide  40 mg Oral Daily  . multivitamin with minerals  1 tablet Oral Daily  . pantoprazole (PROTONIX) IV  40 mg Intravenous Q12H  . pneumococcal 23 valent vaccine  0.5 mL Intramuscular Tomorrow-1000  . sodium chloride flush  10-40 mL Intracatheter Q12H  . spironolactone  12.5 mg Oral Daily  . thiamine  100 mg Intravenous Daily   Continuous Infusions: . sodium chloride 10 mL/hr at 08/07/16 0600  . sodium chloride    . sodium chloride    . sodium chloride    . sodium chloride    . ampicillin-sulbactam (UNASYN) IV 3 g (08/10/16 1020)  . fluconazole (DIFLUCAN) IV Stopped (08/09/16 1400)   PRN Meds: sodium chloride, fentaNYL (SUBLIMAZE) injection, LORazepam **OR** LORazepam, sodium chloride flush   Vital Signs    Vitals:   08/10/16 0245 08/10/16 0506 08/10/16 0700 08/10/16 1207  BP: (!) 151/59 (!) 154/69  (!) 135/58  Pulse: 97 (!) 103  92  Resp: 20 20  18   Temp: 98.8 F (37.1 C) 98.6 F (37 C)  98.4 F (36.9 C)  TempSrc: Oral Oral  Oral  SpO2:    96%  Weight:   61.2 kg (135 lb)   Height:        Intake/Output Summary (Last 24 hours) at 08/10/16 1217 Last data filed at 08/10/16 0849  Gross per 24 hour  Intake          2209.58 ml  Output              350 ml  Net          1859.58 ml   Filed Weights   08/08/16 0350 08/09/16 0500 08/10/16 0700  Weight: 61.6 kg (135 lb 11.2 oz) 60.3 kg (133 lb) 61.2 kg (135 lb)    Telemetry    Normal sinus rhythm with PVCs - Personally  Reviewed   Physical Exam   General:  Sitting in chair No resp difficulty HEENT: normal x for poor dentition Neck: supple. no JVD. Carotids 2+ bilat; no bruits. No lymphadenopathy or thryomegaly appreciated. Cor: Large left chest wall hematoma with pressure dressing. Appears stable. RRR with ectopy Lungs: clear Abdomen: dressing in place. Minimally tender. Good BS Extremities: no cyanosis, clubbing, rash, edema Neuro: alert & orientedx3, cranial nerves grossly intact. moves all 4 extremities w/o difficulty. Affect pleasant   Labs    Chemistry Recent Labs Lab 08/05/16 0930 08/06/16 0408  08/08/16 0425 08/09/16 0526 08/10/16 0745  NA 138 136  < > 139 139 139  K 4.2 3.7  < > 3.2* 3.6 3.0*  CL 110 109  < > 107 106 108  CO2 21* 22  < > 23 23 23   GLUCOSE 129* 112*  < > 101* 134* 106*  BUN 12 11  < > 13 24* 22*  CREATININE 0.81 0.73  < > 0.79 0.93 0.85  CALCIUM 8.0* 7.9*  < > 7.9* 7.9* 7.5*  PROT 4.9* 4.5*  --   --   --  4.2*  ALBUMIN 3.0* 2.6*  --   --   --  2.2*  AST 56* 56*  --   --   --  58*  ALT 19 19  --   --   --  27  ALKPHOS 30* 24*  --   --   --  31*  BILITOT 1.5* 1.3*  --   --   --  1.3*  GFRNONAA >60 >60  < > >60 >60 >60  GFRAA >60 >60  < > >60 >60 >60  ANIONGAP 7 5  < > 9 10 8   < > = values in this interval not displayed.   Hematology  Recent Labs Lab 08/08/16 0425 08/09/16 0526 08/09/16 1959 08/10/16 0745  WBC 8.3 14.5*  --  10.1  RBC 2.79* 2.02*  --  2.28*  HGB 9.5* 6.8* 8.2* 7.5*  HCT 27.3* 19.8* 22.8* 21.1*  MCV 97.8 98.0  --  92.5  MCH 34.1* 33.7  --  32.9  MCHC 34.8 34.3  --  35.5  RDW 13.8 13.9  --  16.7*  PLT 199 268  --  186    Cardiac Enzymes  Recent Labs Lab 08/04/16 2150 08/05/16 0247  TROPONINI 1.01* 1.03*     Recent Labs Lab 08/04/16 1559  TROPIPOC 0.07      Radiology    Ct Chest Wo Contrast  Result Date: 08/08/2016 CLINICAL DATA:  Left upper chest wall edema. EXAM: CT CHEST WITHOUT CONTRAST TECHNIQUE: Multidetector  CT imaging of the chest was performed following the standard protocol without IV contrast. COMPARISON:  CT scan of August 04, 2016. FINDINGS: Cardiovascular: Atherosclerosis of thoracic aorta is noted without aneurysm formation. Coronary artery calcifications are noted. Minimal pericardial effusion is noted. Mediastinum/Nodes: No enlarged mediastinal or axillary lymph nodes. Thyroid gland, trachea, and esophagus demonstrate no significant findings. Lungs/Pleura: No pneumothorax is noted. Moderate bilateral pleural effusions are noted with adjacent subsegmental atelectasis of the lower lobes. Upper Abdomen: Surgical drain is seen superior to the stomach. Musculoskeletal: Mildly displaced anterior left second rib fracture is noted. Stable probable nondisplaced fracture involving T8 vertebral body. There is interval development of probable large hematoma measuring 18 x 14 x 8 mm along the anterior portion of the left chest wall. IMPRESSION: Coronary artery calcifications are noted suggesting coronary artery disease. Moderate bilateral pleural effusions are noted with adjacent subsegmental atelectasis of both lower lobes. Mildly displaced anterior left second rib fracture. Stable probable nondisplaced fracture involving T8 vertebral body. Interval development of large hematoma along the anterior portion of the left chest wall. Aortic Atherosclerosis (ICD10-I70.0). Electronically Signed   By: Marijo Conception, M.D.   On: 08/08/2016 17:44    Cardiac Studies   Transthoracic Echocardiogram  08/06/2016  Study Conclusions  - Left ventricle: The cavity size was normal. There was mild focal   basal hypertrophy of the septum. Systolic function was moderately   reduced. The estimated ejection fraction was in the range of 35%   to 40%. There is akinesis of the apical myocardium. There is   akinesis of the apicalanterior, lateral, inferolateral, and   inferior myocardium. There is akinesis of the midanteroseptal    myocardium. There was an increased relative contribution of   atrial contraction to ventricular filling. Doppler parameters are   consistent with abnormal left ventricular relaxation (grade 1   diastolic dysfunction). There was an apparent, medium-sized,   apicalthrombus. - Pulmonary arteries: Systolic  pressure could not be accurately   estimated. - Pericardium, extracardiac: There was a large left pleural   effusion.   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 CT chest also showed atherosclerosis and mildly displaced anterior left second rib fracture as well as stable probable nondisplaced fracture involving T8 vertebral body. Echo with EF 35-40% with LV thrombus  Developed large chet wall hematoma on 7/27 in setting of [facility]   Assessment & Plan     1. Cardiac ArrestVT/VF:  --Suspect primary event was VT/VF arrest in setting of known ischemic CM (MI in late 80-90. Cared for by Dr. Doreatha Lew. No f/u since. Reports cath at that time with what sounds like POBA) - suspect perforated stomach and rib fx sequale from CPR - no evidence ACS. Trop flat at 1.0. ECG ok - will need cath later this week when chest hematoma stable.  - Will likely need LifeVest (vs ICD -but may be limited currently with chest hematoma). Consult EP this week - Keep K. 4.0 Mg > 2.0  2. CAD with ischemic CM - as above. No evidence of ACS. - ASA on hold due to hematoma - Continue b-blocker. Start statin and Entresto.  - Cath this week  3. Perforated stomach - s/p repair. Suspect due to CPR. Cannot exclude PUD though recent EGD ok.  - GSU following. Advancing diet.   4. Frequent PVCs - May be contributing to systolic dysfunction. - Started carvedilol 7/28. Titrate as tolerated. Follow burden on tele. May need amio - Keep potassium > 4 and Magnesium > 2  5. LV Thrombus:  -Heparin  drip started for LV thrombus seen on this admission by echo. Unfortunately he has since developed an expanding chest wall hematoma with a drop in his hemoglobin. His heparin has been discontinued and he has been given reversal agents and blood transfusion.  6. Chest wall hematoma with symptomatic anemia - appears stable. No AC at this time - Pressure dressing reapplied - Will transfuse 1u RBCs today. D/w primary team  7. Hypokalemia/hypomagnesemia:  - will supp to keep potassium > 4 and Magnesium > 2  8. Acute delirium - Improving  9. ETOH and tobacco abuse - has mild cirrhosis on previous CT.  - has cut back ETOH but hasn't quit - Counseled on need for cessation -  Watch for withdrawal. On folate and thiamine  Signed Glori Bickers, MD  08/10/2016, 12:17 PM

## 2016-08-10 NOTE — Progress Notes (Signed)
Rehab Admissions Coordinator Note:  Patient was screened by Cleatrice Burke for appropriateness for an Inpatient Acute Rehab Consult.  At this time, we are recommending Inpatient Rehab consult. Please place order for consult.  Cleatrice Burke 08/10/2016, 7:34 PM  I can be reached at [phone].

## 2016-08-10 NOTE — Progress Notes (Signed)
Occupational Therapy Evaluation Patient Details Name: [name] [name] MRN: [id number] DOB: 1950/11/16 Today's Date: 08/10/2016    History of Present Illness 66 year old male with unknown past medical history who arrived by [facility] after witnessed collapse in [place].  Bystander CPR for 2-3 mins.  VF/VT arrest. Pneumoperitoneum. Underwent EXPLORATORY LAPAROTOMY FREE AIR BIOPSY OF PERFORATED STOMACH ULCER REPAIR OF PERFORATED STOMACH ULCER 7/23. Pt intubated 7/23 and extubated 7/27.   Clinical Impression   PTA, pt lived with his wife and was independent with ADL and mobility. Pt is a retired Education officer, museum and enjoyed gardening. Pt demonstrates a significant functional declinerequiring Mod A with mobility and ADL.Pt is an excellent CIR candidate and will benefit form intensive rehab to facilitate safe DC home with 24/7 S of wife. Will follow acutely to address established goals and facilitate DC to next venue of care.     Follow Up Recommendations  CIR;Supervision/Assistance - 24 hour    Equipment Recommendations  3 in 1 bedside commode;Other (comment) (RW)    Recommendations for Other Services PT consult     Precautions / Restrictions Precautions Precautions: Fall      Mobility Bed Mobility Overal bed mobility: Needs Assistance Bed Mobility: Supine to Sit     Supine to sit: HOB elevated;Min assist     General bed mobility comments: vc to roll to side to reduce pain with mobility  Transfers                      Balance Overall balance assessment: Needs assistance   Sitting balance-Leahy Scale: Fair       Standing balance-Leahy Scale: Poor                             ADL either performed or assessed with clinical judgement   ADL Overall ADL's : Needs assistance/impaired Eating/Feeding: Set up Eating/Feeding Details (indicate cue type and reason): liquids only Grooming: Set up;Sitting   Upper Body Bathing: Set up;Supervision/  safety;Sitting   Lower Body Bathing: Moderate assistance;Sit to/from stand   Upper Body Dressing : Minimal assistance;Sitting   Lower Body Dressing: Moderate assistance;Sit to/from stand   Toilet Transfer: Moderate assistance;BSC;Stand-pivot   Toileting- Clothing Manipulation and Hygiene: Maximal assistance       Functional mobility during ADLs: Moderate assistance;Cueing for safety       Vision Baseline Vision/History: Wears glasses       Perception     Praxis      Pertinent Vitals/Pain Pain Assessment: Faces Faces Pain Scale: Hurts little more Pain Location: abdomen with mobility Pain Descriptors / Indicators: Discomfort;Grimacing Pain Intervention(s): Limited activity within patient's tolerance     Hand Dominance Right   Extremity/Trunk Assessment Upper Extremity Assessment Upper Extremity Assessment: Generalized weakness;LUE deficits/detail LUE Deficits / Details: liited AROM shoulder to @ 90.swollen elbow. difficulty with full elbow flexion, but able to use functionally   Lower Extremity Assessment Lower Extremity Assessment: Generalized weakness;Defer to PT evaluation   Cervical / Trunk Assessment Cervical / Trunk Assessment: Kyphotic   Communication Communication Communication: No difficulties   Cognition Arousal/Alertness: Awake/alert Behavior During Therapy: WFL for tasks assessed/performed Overall Cognitive Status: Impaired/Different from baseline Area of Impairment: Orientation;Attention;Memory;Safety/judgement;Awareness;Problem solving                 Orientation Level: Disoriented to;Time Current Attention Level: Selective Memory: Decreased short-term memory   Safety/Judgement: Decreased awareness of deficits Awareness: Emergent Problem Solving: Slow processing  General Comments: wife states cognition is improving daily   General Comments       Exercises     Shoulder Instructions      Home Living Family/patient expects to be  discharged to:: Private residence Living Arrangements: Spouse/significant other Available Help at Discharge: Family;Available 24 hours/day Type of Home: House Home Access: Stairs to enter [employer] of Steps: 2   Home Layout: One level         Bathroom Toilet: Standard     Home Equipment: None          Prior Functioning/Environment Level of Independence: Independent        Comments: was driving; enjoyed gardening        OT Problem List: Decreased strength;Decreased activity tolerance;Decreased range of motion;Impaired balance (sitting and/or standing);Decreased cognition;Decreased safety awareness;Decreased knowledge of use of DME or AE;Cardiopulmonary status limiting activity;Pain      OT Treatment/Interventions: Self-care/ADL training;Therapeutic exercise;DME and/or AE instruction;Energy conservation;Therapeutic activities;Cognitive remediation/compensation;Patient/family education;Balance training    OT Goals(Current goals can be found in the care plan section) Acute Rehab OT Goals Patient Stated Goal: to get back to normal OT Goal Formulation: With patient Time For Goal Achievement: 08/24/16 Potential to Achieve Goals: Good ADL Goals Pt Will Perform Lower Body Bathing: with supervision;sit to/from stand Pt Will Perform Lower Body Dressing: with supervision;sit to/from stand Pt Will Transfer to Toilet: with supervision;bedside commode;ambulating (RW) Pt Will Perform Toileting - Clothing Manipulation and hygiene: with supervision;sit to/from stand Additional ADL Goal #1: Pt will demonstrate anticipatory awareness during ADL with min vc  OT Frequency: Min 2X/week   Barriers to D/C:            Co-evaluation              AM-PAC PT "6 Clicks" Daily Activity     Outcome Measure Help from another person eating meals?: None Help from another person taking care of personal grooming?: A Little Help from another person toileting, which includes  using toliet, bedpan, or urinal?: A Lot Help from another person bathing (including washing, rinsing, drying)?: A Lot Help from another person to put on and taking off regular upper body clothing?: A Little Help from another person to put on and taking off regular lower body clothing?: A Lot 6 Click Score: 16   End of Session Equipment Utilized During Treatment: Gait belt Nurse Communication: Mobility status  Activity Tolerance: Patient tolerated treatment well Patient left: in chair;with call bell/phone within reach;with chair alarm set;with family/visitor present  OT Visit Diagnosis: Unsteadiness on feet (R26.81);Muscle weakness (generalized) (M62.81);Other symptoms and signs involving cognitive function;Pain Pain - part of body:  (abdomen)                Time: 6314-9702 OT Time Calculation (min): 43 min Charges:  OT General Charges $OT Visit: 1 Procedure OT Evaluation $OT Eval Moderate Complexity: 1 Procedure OT Treatments $Self Care/Home Management : 23-37 mins G-Codes:     [facility], OT/L  [phone] 08/10/2016  Jaekwon Mcclune,HILLARY 08/10/2016, 10:27 AM

## 2016-08-10 NOTE — Progress Notes (Signed)
Pt. With 10 beats of VTach. Pt. Alert and stable, resting in bed. No s/s of distress or discomfort noted. Pt. Denies pain. VSS. On call for [facility] paged to make aware. RN will continue to monitor for changes in condition. Ellesse Antenucci, Katherine Roan

## 2016-08-10 NOTE — Evaluation (Signed)
Physical Therapy Evaluation Patient Details Name: [name] [name] MRN: [id number] DOB: 15-Feb-1950 Today's Date: 08/10/2016   History of Present Illness  66 year old male with unknown past medical history who arrived by [facility] after witnessed collapse in [place].  Bystander CPR for 2-3 mins.  VF/VT arrest. Pneumoperitoneum. Underwent EXPLORATORY LAPAROTOMY FREE AIR BIOPSY OF PERFORATED STOMACH ULCER REPAIR OF PERFORATED STOMACH ULCER 7/23. Pt intubated 7/23 and extubated 7/27.  Clinical Impression  Pt admitted to hospital post cardiac arrest and received treatment for pneumoperitoneum, LV clot, stomach ulcer, left anterior 2nd rib fracture and left chest wall hematoma. Pt presented awake and alert supine in bed and eager to participate. Pt was min/mod assist for for safety and stability with all transfers and displayed decreased endurance. Pt required verbal cueing for transfers and hand placement on RW. Pt has tendency to sit without warning due to poor endurance, compromising safety;  Pt will benefit from continued acute physical therapy to improve balance, endurance and functional mobility for overall safety. I recommend d/c to CIR for continued therapy to improve safety.     Follow Up Recommendations CIR    Equipment Recommendations  Rolling walker with 5" wheels    Recommendations for Other Services       Precautions / Restrictions Precautions Precautions: Fall Restrictions Weight Bearing Restrictions: No      Mobility  Bed Mobility Overal bed mobility: Needs Assistance Bed Mobility: Supine to Sit     Supine to sit: Min assist     General bed mobility comments: verbal cues for rolling, min assist for sidelying to sit  Transfers Overall transfer level: Needs assistance Equipment used: Rolling walker (2 wheeled) Transfers: Sit to/from Stand Sit to Stand: Min assist, Mod assist         General transfer comment: Stood with difficulty from low bed, stood  easily from raised bed. Sat twice without warning. Verbal cues for hand placement during sit to/from stand transfers and verbal cueing during bed mobility.  Ambulation/Gait                Stairs            Wheelchair Mobility    Modified Rankin (Stroke Patients Only)       Balance Overall balance assessment: Needs assistance Sitting-balance support: No upper extremity supported;Feet supported Sitting balance-Leahy Scale: Normal Sitting balance - Comments: Normal sitting balance, able to lean laterally with control.   Standing balance support: Bilateral upper extremity supported Standing balance-Leahy Scale: Poor Standing balance comment: Stood with assist of RW                             Pertinent Vitals/Pain Pain Assessment: 0-10 Pain Score: 0-No pain    Home Living Family/patient expects to be discharged to:: Private residence Living Arrangements: Spouse/significant other Available Help at Discharge: Family;Available 24 hours/day Type of Home: House     Entrance Stairs-Number of Steps: 2 Home Layout: One level        Prior Function Level of Independence: Independent         Comments: was driving; enjoyed gardening     Hand Dominance   Dominant Hand: Right    Extremity/Trunk Assessment                Communication   Communication: No difficulties  Cognition Arousal/Alertness: Awake/alert Behavior During Therapy: WFL for tasks assessed/performed;Impulsive Overall Cognitive Status: Within Functional Limits for tasks assessed  General Comments      Exercises General Exercises - Lower Extremity Hip Flexion/Marching: Both;10 reps;Standing Mini-Sqauts: Strengthening;Both;5 reps;Standing  Close guard for safety during standing therex   Assessment/Plan    PT Assessment Patient needs continued PT services  PT Problem List Decreased strength;Decreased  balance;Decreased mobility;Decreased safety awareness       PT Treatment Interventions DME instruction;Gait training;Stair training;Functional mobility training;Therapeutic exercise;Balance training;Cognitive remediation;Patient/family education   PT Goals (Current goals can be found in the Care Plan section)  Acute Rehab PT Goals Patient Stated Goal: To be able to walk his dog PT Goal Formulation: With patient Potential to Achieve Goals: Good    Frequency 3X/week   Barriers to discharge        Co-evaluation               AM-PAC PT "6 Clicks" Daily Activity  Outcome Measure Difficulty turning over in bed (including adjusting bedclothes, sheets and blankets)?: None Difficulty moving from lying on back to sitting on the side of the bed? : Total Difficulty sitting down on and standing up from a chair with arms (e.g., wheelchair, bedside commode, etc,.)?: Total Help needed moving to and from a bed to chair (including a wheelchair)?: A Lot Help needed walking in hospital room?: A Lot Help needed climbing 3-5 steps with a railing? : A Lot 6 Click Score: 12    End of Session Equipment Utilized During Treatment: Gait belt Activity Tolerance: Patient tolerated treatment well;No increased pain;Patient limited by fatigue Patient left: in bed;with call bell/phone within reach;with nursing/sitter in room;Other (comment) Print production planner)   PT Visit Diagnosis: Unsteadiness on feet (R26.81);Muscle weakness (generalized) (M62.81);History of falling (Z91.81)    Time: 5361-4431 PT Time Calculation (min) (ACUTE ONLY): 33 min   Charges:    PT Evaluation Moderate Complexity: 1 Procedure  Therapeutic Exercise   PT G Codes:        Sandria Bales, SPT Acute Rehabilitation Services Office: [phone]  Sandria Bales 08/10/2016, 4:38 PM   I was present during the PT session and agree with patient status and findings as outlined by Ria Comment, SPT.  Roney Marion, [place]  Acute Rehabilitation  Services Pager [phone] Office [phone]

## 2016-08-10 NOTE — Progress Notes (Signed)
[facility] Progress Note 6 Days Post-Op  Subjective: Abdominal pain only with movement. Tolerated clears. Received blood and FFP yesterday for expanding chest wall hematoma. Heparin off.   Objective: Vital signs in last 24 hours: Temp:  [97.5 F (36.4 C)-99.1 F (37.3 C)] 98.6 F (37 C) (07/29 0506) Pulse Rate:  [77-138] 103 (07/29 0506) Resp:  [15-20] 20 (07/29 0506) BP: (108-156)/(47-79) 154/69 (07/29 0506) SpO2:  [95 %-98 %] 98 % (07/29 0215) Weight:  [61.2 kg (135 lb)] 61.2 kg (135 lb) (07/29 0700) Last BM Date:  (pta)  Intake/Output from previous day: 07/28 0701 - 07/29 0700 In: 1889.6 [P.O.:570; Blood:1299.6] Out: 350 [Urine:350] Intake/Output this shift: No intake/output data recorded.  General: Swollen left chest wall with a very tense hematoma  Lungs: Clear  Abd: Soft, minimally tender, nondistended. JP output ss. Dressing clean and dry.   Extremities: No clinical signs or symptoms of DVT  Neuro: Intact  Lab Results:  CBC    Component Value Date/Time   WBC 10.1 08/10/2016 0745   RBC 2.28 (L) 08/10/2016 0745   HGB 7.5 (L) 08/10/2016 0745   HCT 21.1 (L) 08/10/2016 0745   PLT 186 08/10/2016 0745   MCV 92.5 08/10/2016 0745   MCH 32.9 08/10/2016 0745   MCHC 35.5 08/10/2016 0745   RDW 16.7 (H) 08/10/2016 0745   LYMPHSABS 6.2 (H) 08/04/2016 1557   MONOABS 1.4 (H) 08/04/2016 1557   EOSABS 0.2 08/04/2016 1557   BASOSABS 0.0 08/04/2016 1557     Recent Labs  08/09/16 0526 08/10/16 0745  NA 139 139  K 3.6 3.0*  CL 106 108  CO2 23 23  GLUCOSE 134* 106*  BUN 24* 22*  CREATININE 0.93 0.85  CALCIUM 7.9* 7.5*   PT/INR  Recent Labs  08/09/16 1114  LABPROT 17.2*  INR 1.39   ABG No results for input(s): PHART, HCO3 in the last 72 hours.  Invalid input(s): PCO2, PO2  Studies/Results: Ct Chest Wo Contrast  Result Date: 08/08/2016 CLINICAL DATA:  Left upper chest wall edema. EXAM: CT CHEST WITHOUT CONTRAST TECHNIQUE: Multidetector CT imaging of the  chest was performed following the standard protocol without IV contrast. COMPARISON:  CT scan of August 04, 2016. FINDINGS: Cardiovascular: Atherosclerosis of thoracic aorta is noted without aneurysm formation. Coronary artery calcifications are noted. Minimal pericardial effusion is noted. Mediastinum/Nodes: No enlarged mediastinal or axillary lymph nodes. Thyroid gland, trachea, and esophagus demonstrate no significant findings. Lungs/Pleura: No pneumothorax is noted. Moderate bilateral pleural effusions are noted with adjacent subsegmental atelectasis of the lower lobes. Upper Abdomen: Surgical drain is seen superior to the stomach. Musculoskeletal: Mildly displaced anterior left second rib fracture is noted. Stable probable nondisplaced fracture involving T8 vertebral body. There is interval development of probable large hematoma measuring 18 x 14 x 8 mm along the anterior portion of the left chest wall. IMPRESSION: Coronary artery calcifications are noted suggesting coronary artery disease. Moderate bilateral pleural effusions are noted with adjacent subsegmental atelectasis of both lower lobes. Mildly displaced anterior left second rib fracture. Stable probable nondisplaced fracture involving T8 vertebral body. Interval development of large hematoma along the anterior portion of the left chest wall. Aortic Atherosclerosis (ICD10-I70.0). Electronically Signed   By: Marijo Conception, M.D.   On: 08/08/2016 17:44    Anti-infectives: Anti-infectives    Start     Dose/Rate Route Frequency Ordered Stop   08/06/16 1200  fluconazole (DIFLUCAN) IVPB 400 mg     400 mg 100 mL/hr over 120 Minutes Intravenous Every  24 hours 08/06/16 0959 08/11/16 1159   08/06/16 1100  Ampicillin-Sulbactam (UNASYN) 3 g in sodium chloride 0.9 % 100 mL IVPB     3 g 200 mL/hr over 30 Minutes Intravenous Every 6 hours 08/06/16 0959     08/05/16 1730  anidulafungin (ERAXIS) 100 mg in sodium chloride 0.9 % 100 mL IVPB  Status:   Discontinued     100 mg 78 mL/hr over 100 Minutes Intravenous Every 24 hours 08/04/16 1657 08/06/16 0959   08/05/16 0000  piperacillin-tazobactam (ZOSYN) IVPB 3.375 g  Status:  Discontinued     3.375 g 12.5 mL/hr over 240 Minutes Intravenous Every 8 hours 08/04/16 1717 08/06/16 0959   08/04/16 1800  piperacillin-tazobactam (ZOSYN) IVPB 3.375 g  Status:  Discontinued     3.375 g 100 mL/hr over 30 Minutes Intravenous To Surgery 08/04/16 1754 08/04/16 1848   08/04/16 1730  anidulafungin (ERAXIS) 200 mg in sodium chloride 0.9 % 200 mL IVPB     200 mg 78 mL/hr over 200 Minutes Intravenous  Once 08/04/16 1657 08/05/16 0222   08/04/16 1700  piperacillin-tazobactam (ZOSYN) IVPB 3.375 g  Status:  Discontinued     3.375 g 100 mL/hr over 30 Minutes Intravenous  Once 08/04/16 1655 08/06/16 0959      Assessment/Plan: s/p Procedure(s): EXPLORATORY LAPAROTOMY FREE AIR BIOPSY OF PERFORATED STOMACH ULCER REPAIR OF PERFORATED STOMACH ULCER WITH A OMENTAL PATCH Advance to full liquids Continue abx and PPI Sleep hygiene, minimize narcotics and avoid benzos for delirium PT/OT Dressing changes for midline wound   LOS: 6 days    Clovis Riley MD 08/10/2016

## 2016-08-10 NOTE — Progress Notes (Addendum)
PROGRESS NOTE  [name] [name] [id number]:[id number] DOB: 07-20-1950 DOA: 08/04/2016 PCP: No primary care provider on file.  HPI/Recap of past 24 hours:  Left chest wall hematoma with pressor dressing on , he is sitting up in chair, less confused, very pleasant, report feeling better, No fever, less sinus tachycardia, bp stable Wife at bedside  Assessment/Plan: Active Problems:   Pneumoperitoneum   Syncope and collapse   Acute respiratory failure (HCC)   Frequent PVCs   Cardiomyopathy, ischemic   Chest wall trauma    Cardiac arrest (presenting symptom):  -Out of hospital VF/VT arrest , witness - unclear etiology. Required 1 shock and 2 - 3 minutes CPR before ROSC. 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 -out of hospital/ witness/ not able to have hypothermia protocol due to perforated viscus -He was intubated and admitted to icu,  -He has improved , extubated and transferred to hospitalist service on 7/27  LV thrombus:  heparin drip held on 7/28 due to expanding large left chest wall hematoma Cardiology consulted, cardiac work up once surgical issue for perforated viscus imporves.  Systolic CHF  -Echo on 7/67 lvef 35-40% with grade 1 diastolic dysfunction -Ct chest on 7/28 "Moderate bilateral pleural effusions are noted with adjacent subsegmental atelectasis of both lower lobes. -cardiology /heart failure team consulted, he is started on on lasix/spironolactone, coreg, entresto -close monitor I/o's  Hypokalemia/hypomagnesemia: replace k/mag   PERFORATED STOMACH ULCER vs perforation from CPR  s/p  EXPLORATORY LAPAROTOMY FREE AIR BIOPSY OF Continue IV abx, IV PPI Diet advancement per general surgery ,will follow general surgery recommendations   Large chest wall hematoma, rib fracture, acute blood loss anemia -s/p prbc transfusion/ffp, hold heparin drip -Thoracic surgery Dr Lucianne Lei trigt consulted  Stable probable nondisplaced fracture  involving T8 vertebral body. He denies back pain.  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  ICU delirium? He also has h/o alcohol use Sitter in room  Code Status: full  Family Communication: patient and wife at bedside  Disposition Plan: not ready for discharge, will need cardiac cath   Consultants:  pccm admit, transferred to [facility] hospitalist on 7/27  General surgery  cardiology /heart failure team  Thoracic surgery Dr Lucianne Lei trigt  Procedures:  Intubation/extubation  EXPLORATORY LAPAROTOMY FREE AIR BIOPSY OF PERFORATED STOMACH ULCER REPAIR OF PERFORATED STOMACH ULCER WITH A OMENTAL PATCH (N/A)  prbc /ffp transfusion  Left IJ placement and removal   .    Antibiotics:  unasyn/diflucan   Objective: BP (!) 141/54   Pulse 95   Temp 97.7 F (36.5 C) (Oral)   Resp 18   Ht 5\' 8"  (1.727 m)   Wt 61.2 kg (135 lb)   SpO2 96%   BMI 20.53 kg/m   Intake/Output Summary (Last 24 hours) at 08/10/16 1312 Last data filed at 08/10/16 0849  Gross per 24 hour  Intake          2209.58 ml  Output              350 ml  Net          1859.58 ml   Filed Weights   08/08/16 0350 08/09/16 0500 08/10/16 0700  Weight: 61.6 kg (135 lb 11.2 oz) 60.3 kg (133 lb) 61.2 kg (135 lb)    Exam:   General:  Much better today, still slightly confused, but NAD, very pleasant, sitting up in chair  Cardiovascular: less sinus tachycardia  Respiratory: diminished at basis, no wheezing, no  rhonchi  Abdomen: potst op changes, dressing intact, soft, nondistended, drain in place with serosanguinous fluids, positive BS  Musculoskeletal: No Edema  Neuro: slightly confused  Data Reviewed: Basic Metabolic Panel:  Recent Labs Lab 08/05/16 0247  08/05/16 1539 08/06/16 0408 08/07/16 0232 08/08/16 0425 08/09/16 0526 08/10/16 0745  NA 137  < >  --  136 138 139 139 139  K 4.2  < >  --  3.7 3.8 3.2* 3.6 3.0*  CL 112*  < >  --  109 107 107 106 108  CO2 22  < >  --  22 24 23  23 23   GLUCOSE 148*  < >  --  112* 84 101* 134* 106*  BUN 9  < >  --  11 10 13  24* 22*  CREATININE 0.82  < >  --  0.73 0.79 0.79 0.93 0.85  CALCIUM 8.2*  < >  --  7.9* 7.8* 7.9* 7.9* 7.5*  MG 1.1*  < > 2.0 2.4 1.7  --  2.3 1.8  PHOS 1.3*  --   --  2.8 2.4*  --   --   --   < > = values in this interval not displayed. Liver Function Tests:  Recent Labs Lab 08/05/16 0930 08/06/16 0408 08/10/16 0745  AST 56* 56* 58*  ALT 19 19 27   ALKPHOS 30* 24* 31*  BILITOT 1.5* 1.3* 1.3*  PROT 4.9* 4.5* 4.2*  ALBUMIN 3.0* 2.6* 2.2*   No results for input(s): LIPASE, AMYLASE in the last 168 hours. No results for input(s): AMMONIA in the last 168 hours. CBC:  Recent Labs Lab 08/04/16 1557  08/06/16 0408 08/07/16 0232 08/08/16 0425 08/09/16 0526 08/09/16 1959 08/10/16 0745  WBC 17.6*  < > 10.8* 10.3 8.3 14.5*  --  10.1  NEUTROABS 9.8*  --   --   --   --   --   --   --   HGB 15.7  < > 11.7* 11.0* 9.5* 6.8* 8.2* 7.5*  HCT 45.1  < > 34.3* 32.2* 27.3* 19.8* 22.8* 21.1*  MCV 100.7*  < > 100.0 99.7 97.8 98.0  --  92.5  PLT 378  < > 217 203 199 268  --  186  < > = values in this interval not displayed. Cardiac Enzymes:    Recent Labs Lab 08/04/16 2150 08/05/16 0247  TROPONINI 1.01* 1.03*   BNP (last 3 results) No results for input(s): BNP in the last 8760 hours.  ProBNP (last 3 results) No results for input(s): PROBNP in the last 8760 hours.  CBG:  Recent Labs Lab 08/08/16 2132 08/09/16 0730 08/09/16 1125 08/09/16 1632 08/09/16 2114  GLUCAP 159* 147* 128* 136* 127*    Recent Results (from the past 240 hour(s))  MRSA PCR Screening     Status: None   Collection Time: 08/04/16  7:03 PM  Result Value Ref Range Status   MRSA by PCR NEGATIVE NEGATIVE Final    Comment:        The GeneXpert MRSA Assay (FDA approved for NASAL specimens only), is one component of a comprehensive MRSA colonization surveillance program. It is not intended to diagnose MRSA infection nor to  guide or monitor treatment for MRSA infections.   Culture, blood (Routine X 2) w Reflex to ID Panel     Status: None   Collection Time: 08/04/16  7:30 PM  Result Value Ref Range Status   Specimen Description BLOOD LEFT HAND  Final   Special Requests  IN PEDIATRIC BOTTLE Blood Culture adequate volume  Final   Culture NO GROWTH 5 DAYS  Final   Report Status 08/09/2016 FINAL  Final  Culture, blood (Routine X 2) w Reflex to ID Panel     Status: None   Collection Time: 08/04/16  7:42 PM  Result Value Ref Range Status   Specimen Description BLOOD LEFT WRIST  Final   Special Requests IN PEDIATRIC BOTTLE Blood Culture adequate volume  Final   Culture NO GROWTH 5 DAYS  Final   Report Status 08/09/2016 FINAL  Final     Studies: No results found.  Scheduled Meds: . carvedilol  3.125 mg Oral BID WC  . Chlorhexidine Gluconate Cloth  6 each Topical Daily  . docusate sodium  100 mg Oral BID  . folic acid  1 mg Oral Daily  . folic acid  1 mg Intravenous Daily  . furosemide  40 mg Oral Daily  . multivitamin with minerals  1 tablet Oral Daily  . pantoprazole (PROTONIX) IV  40 mg Intravenous Q12H  . pneumococcal 23 valent vaccine  0.5 mL Intramuscular Tomorrow-1000  . potassium chloride  40 mEq Oral Once  . potassium chloride  40 mEq Oral Once  . rosuvastatin  10 mg Oral q1800  . sacubitril-valsartan  1 tablet Oral BID  . sodium chloride flush  10-40 mL Intracatheter Q12H  . spironolactone  12.5 mg Oral Daily  . thiamine  100 mg Intravenous Daily    Continuous Infusions: . sodium chloride 10 mL/hr at 08/07/16 0600  . sodium chloride    . sodium chloride    . sodium chloride    . sodium chloride    . sodium chloride    . ampicillin-sulbactam (UNASYN) IV Stopped (08/10/16 1050)  . fluconazole (DIFLUCAN) IV 400 mg (08/10/16 1200)     Time spent: 65mins  Kyllie Pettijohn MD, PhD  [facility] Pager [phone]. If 7PM-7AM, please contact night-coverage at [email or web address], password  [facility] 08/10/2016, 1:12 PM  LOS: 6 days

## 2016-08-10 NOTE — Progress Notes (Signed)
FFP infusion completed.

## 2016-08-11 ENCOUNTER — Inpatient Hospital Stay (HOSPITAL_COMMUNITY): Payer: PPO

## 2016-08-11 DIAGNOSIS — K251 Acute gastric ulcer with perforation: Secondary | ICD-10-CM

## 2016-08-11 DIAGNOSIS — S20219D Contusion of unspecified front wall of thorax, subsequent encounter: Secondary | ICD-10-CM

## 2016-08-11 DIAGNOSIS — S20219A Contusion of unspecified front wall of thorax, initial encounter: Secondary | ICD-10-CM

## 2016-08-11 DIAGNOSIS — R55 Syncope and collapse: Secondary | ICD-10-CM

## 2016-08-11 DIAGNOSIS — I10 Essential (primary) hypertension: Secondary | ICD-10-CM

## 2016-08-11 DIAGNOSIS — I952 Hypotension due to drugs: Secondary | ICD-10-CM

## 2016-08-11 DIAGNOSIS — R000 Tachycardia, unspecified: Secondary | ICD-10-CM

## 2016-08-11 DIAGNOSIS — R0989 Other specified symptoms and signs involving the circulatory and respiratory systems: Secondary | ICD-10-CM

## 2016-08-11 DIAGNOSIS — J90 Pleural effusion, not elsewhere classified: Secondary | ICD-10-CM

## 2016-08-11 DIAGNOSIS — K255 Chronic or unspecified gastric ulcer with perforation: Secondary | ICD-10-CM

## 2016-08-11 LAB — COMPREHENSIVE METABOLIC PANEL
ALBUMIN: 2 g/dL — AB (ref 3.5–5.0)
ALK PHOS: 34 U/L — AB (ref 38–126)
ALT: 28 U/L (ref 17–63)
AST: 54 U/L — ABNORMAL HIGH (ref 15–41)
Anion gap: 7 (ref 5–15)
BUN: 14 mg/dL (ref 6–20)
CALCIUM: 7.4 mg/dL — AB (ref 8.9–10.3)
CO2: 24 mmol/L (ref 22–32)
CREATININE: 0.8 mg/dL (ref 0.61–1.24)
Chloride: 107 mmol/L (ref 101–111)
GFR calc Af Amer: >60 mL/min (ref >60)
GFR calc non Af Amer: >60 mL/min (ref >60)
GLUCOSE: 108 mg/dL — AB (ref 65–99)
Potassium: 3.3 mmol/L — ABNORMAL LOW (ref 3.5–5.1)
SODIUM: 138 mmol/L (ref 135–145)
Total Bilirubin: 1.4 mg/dL — ABNORMAL HIGH (ref 0.3–1.2)
Total Protein: 4 g/dL — ABNORMAL LOW (ref 6.5–8.1)

## 2016-08-11 LAB — BPAM FFP
BLOOD PRODUCT EXPIRATION DATE: 201808012359
ISSUE DATE / TIME: 201807290212
UNIT TYPE AND RH: 6200

## 2016-08-11 LAB — CBC
HCT: 25 % — ABNORMAL LOW (ref 39.0–52.0)
HEMOGLOBIN: 8.5 g/dL — AB (ref 13.0–17.0)
MCH: 31.4 pg (ref 26.0–34.0)
MCHC: 34 g/dL (ref 30.0–36.0)
MCV: 92.3 fL (ref 78.0–100.0)
Platelets: 192 10*3/uL (ref 150–400)
RBC: 2.71 MIL/uL — AB (ref 4.22–5.81)
RDW: 18.4 % — ABNORMAL HIGH (ref 11.5–15.5)
WBC: 9.4 10*3/uL (ref 4.0–10.5)

## 2016-08-11 LAB — MAGNESIUM: Magnesium: 1.6 mg/dL — ABNORMAL LOW (ref 1.7–2.4)

## 2016-08-11 LAB — PREPARE FRESH FROZEN PLASMA: UNIT DIVISION: 0

## 2016-08-11 MED ORDER — POTASSIUM CHLORIDE CRYS ER 20 MEQ PO TBCR
40.0000 meq | EXTENDED_RELEASE_TABLET | Freq: Once | ORAL | Status: AC
  Administered 2016-08-11: 40 meq via ORAL
  Filled 2016-08-11: qty 2

## 2016-08-11 MED ORDER — VITAMIN B-1 100 MG PO TABS
100.0000 mg | ORAL_TABLET | Freq: Every day | ORAL | Status: DC
  Administered 2016-08-11 – 2016-08-14 (×3): 100 mg via ORAL
  Filled 2016-08-11 (×4): qty 1

## 2016-08-11 MED ORDER — CARVEDILOL 3.125 MG PO TABS
3.1250 mg | ORAL_TABLET | Freq: Once | ORAL | Status: AC
  Administered 2016-08-11: 3.125 mg via ORAL
  Filled 2016-08-11: qty 1

## 2016-08-11 MED ORDER — CARVEDILOL 6.25 MG PO TABS
6.2500 mg | ORAL_TABLET | Freq: Two times a day (BID) | ORAL | Status: DC
  Administered 2016-08-11: 6.25 mg via ORAL
  Filled 2016-08-11: qty 1

## 2016-08-11 MED ORDER — MAGNESIUM SULFATE 2 GM/50ML IV SOLN
2.0000 g | Freq: Once | INTRAVENOUS | Status: AC
  Filled 2016-08-11: qty 50

## 2016-08-11 MED ORDER — POTASSIUM CHLORIDE CRYS ER 20 MEQ PO TBCR
40.0000 meq | EXTENDED_RELEASE_TABLET | Freq: Two times a day (BID) | ORAL | Status: DC
  Administered 2016-08-11 – 2016-08-14 (×7): 40 meq via ORAL
  Filled 2016-08-11 (×8): qty 2

## 2016-08-11 MED ORDER — MAGNESIUM SULFATE 2 GM/50ML IV SOLN
2.0000 g | Freq: Once | INTRAVENOUS | Status: AC
  Administered 2016-08-11: 2 g via INTRAVENOUS
  Filled 2016-08-11: qty 50

## 2016-08-11 MED ORDER — SPIRONOLACTONE 25 MG PO TABS
25.0000 mg | ORAL_TABLET | Freq: Every day | ORAL | Status: DC
  Administered 2016-08-11 – 2016-08-14 (×3): 25 mg via ORAL
  Filled 2016-08-11 (×4): qty 1

## 2016-08-11 NOTE — Progress Notes (Signed)
PROGRESS NOTE  [name] [name] OVZ:[id number] DOB: 1950-04-23 DOA: 08/04/2016 PCP: No primary care provider on file.  HPI/Recap of past 24 hours:  C/o Left chest wall hematoma  And left arm pain,  Confusion has much improved, No fever, denies sob, sinus tachycardia has resolved, bp stable Wife at bedside  Assessment/Plan: Active Problems:   Pneumoperitoneum   Syncope and collapse   Acute respiratory failure (HCC)   Frequent PVCs   Cardiomyopathy, ischemic   Chest wall trauma    Cardiac arrest (presenting symptom):  -Out of hospital VF/VT arrest , witness - unclear etiology. Required 1 shock and 2 - 3 minutes CPR before ROSC. 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 -out of hospital/ witness/ not able to have hypothermia protocol due to perforated viscus -He was intubated and admitted to icu,  -He has improved , extubated and transferred to hospitalist service on 7/27  LV thrombus:  heparin drip held on 7/28 due to expanding large left chest wall hematoma Cardiology consulted, cardiac work up once surgical issue for perforated viscus imporves.  Systolic CHF  -Echo on 4/12 lvef 35-40% with grade 1 diastolic dysfunction -Ct chest on 7/28 "Moderate bilateral pleural effusions are noted with adjacent subsegmental atelectasis of both lower lobes. -cardiology /heart failure team consulted, he is started on on lasix/spironolactone, coreg, entresto -close monitor I/o's  Hypokalemia/hypomagnesemia: replace k/mag   PERFORATED STOMACH ULCER vs perforation from CPR  s/p  EXPLORATORY LAPAROTOMY FREE AIR BIOPSY OF Continue IV abx, IV PPI Diet advancement per general surgery ,will follow general surgery recommendations   Large chest wall hematoma, rib fracture, acute blood loss anemia -s/p prbc transfusion/ffp, hold heparin drip -Thoracic surgery Dr Lucianne Lei trigt consulted  Stable probable nondisplaced fracture involving T8 vertebral body. He denies  back pain.  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  ICU delirium? He also has h/o alcohol use improving  Code Status: full  Family Communication: patient and wife at bedside  Disposition Plan:  will need cardiac cath, may benefit from inpatient rehab   Consultants:  pccm admit, transferred to [facility] hospitalist on 7/27  General surgery  cardiology /heart failure team  Thoracic surgery Dr Lucianne Lei trigt  Procedures:  Intubation/extubation  EXPLORATORY LAPAROTOMY FREE AIR BIOPSY OF PERFORATED STOMACH ULCER REPAIR OF PERFORATED STOMACH ULCER WITH A OMENTAL PATCH (N/A)  prbc /ffp transfusion  Left IJ placement and removal   .    Antibiotics:  unasyn/diflucan   Objective: BP (!) 153/69 (BP Location: Left Arm)   Pulse 92   Temp 98.6 F (37 C) (Oral)   Resp 20   Ht 5\' 8"  (1.727 m)   Wt 65.5 kg (144 lb 6.4 oz) Comment: c scale  SpO2 93%   BMI 21.96 kg/m   Intake/Output Summary (Last 24 hours) at 08/11/16 0819 Last data filed at 08/11/16 0602  Gross per 24 hour  Intake             1715 ml  Output             1985 ml  Net             -270 ml   Filed Weights   08/09/16 0500 08/10/16 0700 08/11/16 0554  Weight: 60.3 kg (133 lb) 61.2 kg (135 lb) 65.5 kg (144 lb 6.4 oz)    Exam:   General:  Improving, NAD, very pleasant, chest wall hematoma/echcymosis   Cardiovascular: sinus tachycardia resolved, now RRR with ectopic beats  Respiratory: diminished at basis, no wheezing, no rhonchi  Abdomen: potst op changes, dressing intact, soft, nondistended, drain in place with serosanguinous fluids, positive BS  Musculoskeletal: No Edema  Neuro: slightly confused about the month, oriented to year/place/person  Data Reviewed: Basic Metabolic Panel:  Recent Labs Lab 08/05/16 0247  08/06/16 0408 08/07/16 0232 08/08/16 0425 08/09/16 0526 08/10/16 0745 08/11/16 0528  NA 137  < > 136 138 139 139 139 138  K 4.2  < > 3.7 3.8 3.2* 3.6 3.0* 3.3*  CL  112*  < > 109 107 107 106 108 107  CO2 22  < > 22 24 23 23 23 24   GLUCOSE 148*  < > 112* 84 101* 134* 106* 108*  BUN 9  < > 11 10 13  24* 22* 14  CREATININE 0.82  < > 0.73 0.79 0.79 0.93 0.85 0.80  CALCIUM 8.2*  < > 7.9* 7.8* 7.9* 7.9* 7.5* 7.4*  MG 1.1*  < > 2.4 1.7  --  2.3 1.8 1.6*  PHOS 1.3*  --  2.8 2.4*  --   --   --   --   < > = values in this interval not displayed. Liver Function Tests:  Recent Labs Lab 08/05/16 0930 08/06/16 0408 08/10/16 0745 08/11/16 0528  AST 56* 56* 58* 54*  ALT 19 19 27 28   ALKPHOS 30* 24* 31* 34*  BILITOT 1.5* 1.3* 1.3* 1.4*  PROT 4.9* 4.5* 4.2* 4.0*  ALBUMIN 3.0* 2.6* 2.2* 2.0*   No results for input(s): LIPASE, AMYLASE in the last 168 hours. No results for input(s): AMMONIA in the last 168 hours. CBC:  Recent Labs Lab 08/04/16 1557  08/07/16 0232 08/08/16 0425 08/09/16 0526 08/09/16 1959 08/10/16 0745 08/11/16 0528  WBC 17.6*  < > 10.3 8.3 14.5*  --  10.1 9.4  NEUTROABS 9.8*  --   --   --   --   --   --   --   HGB 15.7  < > 11.0* 9.5* 6.8* 8.2* 7.5* 8.5*  HCT 45.1  < > 32.2* 27.3* 19.8* 22.8* 21.1* 25.0*  MCV 100.7*  < > 99.7 97.8 98.0  --  92.5 92.3  PLT 378  < > 203 199 268  --  186 192  < > = values in this interval not displayed. Cardiac Enzymes:    Recent Labs Lab 08/04/16 2150 08/05/16 0247  TROPONINI 1.01* 1.03*   BNP (last 3 results) No results for input(s): BNP in the last 8760 hours.  ProBNP (last 3 results) No results for input(s): PROBNP in the last 8760 hours.  CBG:  Recent Labs Lab 08/08/16 2132 08/09/16 0730 08/09/16 1125 08/09/16 1632 08/09/16 2114  GLUCAP 159* 147* 128* 136* 127*    Recent Results (from the past 240 hour(s))  MRSA PCR Screening     Status: None   Collection Time: 08/04/16  7:03 PM  Result Value Ref Range Status   MRSA by PCR NEGATIVE NEGATIVE Final    Comment:        The GeneXpert MRSA Assay (FDA approved for NASAL specimens only), is one component of a comprehensive MRSA  colonization surveillance program. It is not intended to diagnose MRSA infection nor to guide or monitor treatment for MRSA infections.   Culture, blood (Routine X 2) w Reflex to ID Panel     Status: None   Collection Time: 08/04/16  7:30 PM  Result Value Ref Range Status   Specimen Description BLOOD LEFT HAND  Final   Special Requests IN PEDIATRIC BOTTLE Blood Culture adequate volume  Final   Culture NO GROWTH 5 DAYS  Final   Report Status 08/09/2016 FINAL  Final  Culture, blood (Routine X 2) w Reflex to ID Panel     Status: None   Collection Time: 08/04/16  7:42 PM  Result Value Ref Range Status   Specimen Description BLOOD LEFT WRIST  Final   Special Requests IN PEDIATRIC BOTTLE Blood Culture adequate volume  Final   Culture NO GROWTH 5 DAYS  Final   Report Status 08/09/2016 FINAL  Final     Studies: No results found.  Scheduled Meds: . carvedilol  3.125 mg Oral BID WC  . Chlorhexidine Gluconate Cloth  6 each Topical Daily  . docusate sodium  100 mg Oral BID  . folic acid  1 mg Oral Daily  . folic acid  1 mg Intravenous Daily  . furosemide  40 mg Oral Daily  . multivitamin with minerals  1 tablet Oral Daily  . pantoprazole (PROTONIX) IV  40 mg Intravenous Q12H  . pneumococcal 23 valent vaccine  0.5 mL Intramuscular Tomorrow-1000  . potassium chloride  40 mEq Oral Once  . rosuvastatin  10 mg Oral q1800  . sacubitril-valsartan  1 tablet Oral BID  . sodium chloride flush  10-40 mL Intracatheter Q12H  . spironolactone  12.5 mg Oral Daily  . thiamine  100 mg Intravenous Daily    Continuous Infusions: . sodium chloride 10 mL/hr at 08/07/16 0600  . sodium chloride    . sodium chloride    . sodium chloride    . sodium chloride    . ampicillin-sulbactam (UNASYN) IV Stopped (08/11/16 0542)  . magnesium sulfate 1 - 4 g bolus IVPB       Time spent: 60mins  Billiejean Schimek MD, PhD  [facility] Pager [phone]. If 7PM-7AM, please contact night-coverage at [email or web address],  password [facility] 08/11/2016, 8:19 AM  LOS: 7 days

## 2016-08-11 NOTE — Clinical Social Work Placement (Signed)
   CLINICAL SOCIAL WORK PLACEMENT  NOTE  Date:  08/11/2016  Patient Details  Name: [name] [name] MRN: [id number] Date of Birth: [birth year]  Clinical Social Work is seeking post-discharge placement for this patient at the [place] level of care (*CSW will initial, date and re-position this form in  chart as items are completed):  Yes   Patient/family provided with [facility] Work Department's list of facilities offering this level of care within the geographic area requested by the patient (or if unable, by the patient's family).  Yes   Patient/family informed of their freedom to choose among providers that offer the needed level of care, that participate in Medicare, Medicaid or managed care program needed by the patient, have an available bed and are willing to accept the patient.  Yes   Patient/family informed of [facility]'s ownership interest in [facility] and [facility], as well as of the fact that they are under no obligation to receive care at these facilities.  PASRR submitted to EDS on 08/11/16     PASRR number received on 08/11/16     Existing PASRR number confirmed on       FL2 transmitted to all facilities in geographic area requested by pt/family on 08/11/16     FL2 transmitted to all facilities within larger geographic area on       Patient informed that his/her managed care company has contracts with or will negotiate with certain facilities, including the following:            Patient/family informed of bed offers received.  Patient chooses bed at       Physician recommends and patient chooses bed at      Patient to be transferred to   on  .  Patient to be transferred to facility by       Patient family notified on   of transfer.  Name of family member notified:        PHYSICIAN Please sign FL2     Additional Comment:    _______________________________________________ Candie Chroman,  LCSW 08/11/2016, 3:51 PM

## 2016-08-11 NOTE — Consult Note (Signed)
   [facility] CM Inpatient Consult   08/11/2016  [name] [name] [birth year] [id number]  Patient was assessed for HF in the [employer] plan/ACO.  Chart review and reveals the patient was admitted with VF/VT arrest.  Patient is currently being consider for inpatient rehabilitation.  Met with the patient at the bedside to introduce him to [facility] in community care management needs.  Patient states, "I will follow up with my wife about it." Give a brochure, 24 hour nurse line magnet, and contact information.For questions, referrals.  Please contact:  Natividad Brood, RN BSN [facility] Liaison  [phone] business mobile phone Toll free office [phone]

## 2016-08-11 NOTE — Progress Notes (Signed)
I will follow up with pt and family tomorrow to discuss rehab venue options once medical workup is complete. Any rehab venue [employer] will have to approve. [phone]

## 2016-08-11 NOTE — Progress Notes (Signed)
Progress Note  Patient Name: [name] [name] Date of Encounter: 08/11/2016  Primary Cardiologist: New  Subjective   Yesterday hgb 7.7. Received 1UPRBCs. Todays hgb 8.5.  Feeling stronger today. Dizzy when moving. Today he is oriented and able to reposition independently. Complaining of LUE pain. Chest sore as well. Mild pleuritic pain on inspiration. Tolerating diet.    Inpatient Medications    Scheduled Meds: . carvedilol  3.125 mg Oral BID WC  . Chlorhexidine Gluconate Cloth  6 each Topical Daily  . docusate sodium  100 mg Oral BID  . folic acid  1 mg Oral Daily  . folic acid  1 mg Intravenous Daily  . furosemide  40 mg Oral Daily  . multivitamin with minerals  1 tablet Oral Daily  . pantoprazole (PROTONIX) IV  40 mg Intravenous Q12H  . pneumococcal 23 valent vaccine  0.5 mL Intramuscular Tomorrow-1000  . potassium chloride  40 mEq Oral Once  . rosuvastatin  10 mg Oral q1800  . sacubitril-valsartan  1 tablet Oral BID  . sodium chloride flush  10-40 mL Intracatheter Q12H  . spironolactone  12.5 mg Oral Daily  . thiamine  100 mg Intravenous Daily   Continuous Infusions: . sodium chloride 10 mL/hr at 08/07/16 0600  . sodium chloride    . sodium chloride    . sodium chloride    . sodium chloride    . ampicillin-sulbactam (UNASYN) IV Stopped (08/11/16 0542)  . magnesium sulfate 1 - 4 g bolus IVPB     PRN Meds: sodium chloride, fentaNYL (SUBLIMAZE) injection, sodium chloride flush   Vital Signs    Vitals:   08/10/16 1331 08/10/16 1528 08/10/16 2107 08/11/16 0554  BP: (!) 167/56 (!) 174/60 (!) 161/74 (!) 153/69  Pulse: 90 89 95 92  Resp: 18 18 18 20   Temp: 98.6 F (37 C) 98.4 F (36.9 C) 99.3 F (37.4 C) 98.6 F (37 C)  TempSrc: Oral Oral Oral Oral  SpO2: 96% 95% 93% 93%  Weight:    144 lb 6.4 oz (65.5 kg)  Height:        Intake/Output Summary (Last 24 hours) at 08/11/16 0957 Last data filed at 08/11/16 0602  Gross per 24 hour  Intake              1395 ml  Output             1985 ml  Net             -590 ml   Filed Weights   08/09/16 0500 08/10/16 0700 08/11/16 0554  Weight: 133 lb (60.3 kg) 135 lb (61.2 kg) 144 lb 6.4 oz (65.5 kg)    Telemetry    Personally reviewed. NSR 90s with frequent PVCs.    Physical Exam   General:   No resp difficulty. NAD HEENT: normal Neck: supple. no JVD. Carotids 2+ bilat; no bruits. No lymphadenopathy or thryomegaly appreciated. Cor: PMI nondisplaced. Regular rate & rhythm. No rubs, gallops or murmurs. L upper chest hematoma dressing in place. No skin breakdown under dressing. Large ecchymosis along left side of ribs Lungs: Decreased LLL other wise clear Abdomen: soft, nontender, nondistended. Dressing in place. Wound looks ok. No hepatosplenomegaly. No bruits or masses. Good bowel sounds. Extremities: no cyanosis, clubbing, rash, LUE 2+ edema  With ecchymosis Neuro: alert & orientedx3, cranial nerves grossly intact. moves all 4 extremities w/o difficulty. Affect pleasant  Labs    Chemistry Recent Labs Lab 08/06/16 0408  08/09/16 5462  08/10/16 0745 08/11/16 0528  NA 136  < > 139 139 138  K 3.7  < > 3.6 3.0* 3.3*  CL 109  < > 106 108 107  CO2 22  < > 23 23 24   GLUCOSE 112*  < > 134* 106* 108*  BUN 11  < > 24* 22* 14  CREATININE 0.73  < > 0.93 0.85 0.80  CALCIUM 7.9*  < > 7.9* 7.5* 7.4*  PROT 4.5*  --   --  4.2* 4.0*  ALBUMIN 2.6*  --   --  2.2* 2.0*  AST 56*  --   --  58* 54*  ALT 19  --   --  27 28  ALKPHOS 24*  --   --  31* 34*  BILITOT 1.3*  --   --  1.3* 1.4*  GFRNONAA >60  < > >60 >60 >60  GFRAA >60  < > >60 >60 >60  ANIONGAP 5  < > 10 8 7   < > = values in this interval not displayed.   Hematology  Recent Labs Lab 08/09/16 0526 08/09/16 1959 08/10/16 0745 08/11/16 0528  WBC 14.5*  --  10.1 9.4  RBC 2.02*  --  2.28* 2.71*  HGB 6.8* 8.2* 7.5* 8.5*  HCT 19.8* 22.8* 21.1* 25.0*  MCV 98.0  --  92.5 92.3  MCH 33.7  --  32.9 31.4  MCHC 34.3  --  35.5 34.0  RDW 13.9   --  16.7* 18.4*  PLT 268  --  186 192    Cardiac Enzymes  Recent Labs Lab 08/04/16 2150 08/05/16 0247  TROPONINI 1.01* 1.03*     Recent Labs Lab 08/04/16 1559  TROPIPOC 0.07      Radiology    No results found.  Cardiac Studies   Transthoracic Echocardiogram  08/06/2016  Study Conclusions  - Left ventricle: The cavity size was normal. There was mild focal   basal hypertrophy of the septum. Systolic function was moderately   reduced. The estimated ejection fraction was in the range of 35%   to 40%. There is akinesis of the apical myocardium. There is   akinesis of the apicalanterior, lateral, inferolateral, and   inferior myocardium. There is akinesis of the midanteroseptal   myocardium. There was an increased relative contribution of   atrial contraction to ventricular filling. Doppler parameters are   consistent with abnormal left ventricular relaxation (grade 1   diastolic dysfunction). There was an apparent, medium-sized,   apicalthrombus. - Pulmonary arteries: Systolic pressure could not be accurately   estimated. - Pericardium, extracardiac: There was a large left pleural   effusion.   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 CT chest also showed atherosclerosis and mildly displaced anterior left second rib fracture as well as stable probable nondisplaced fracture involving T8 vertebral body. Echo with EF 35-40% with LV thrombus  Developed large chet wall hematoma on 7/27 in setting of [facility]   Assessment & Plan     1. Cardiac ArrestVT/VF:  --Suspect primary event was VT/VF arrest in setting of known ischemic CM (MI in late 80-90. Cared for by Dr. Doreatha Lew. No f/u since. Reports cath at that time with what sounds like POBA) - suspect perforated stomach and rib fx sequale from CPR - no evidence ACS. Trop flat at 1.0. ECG  ok -ECHO EF 35-40%.  - Cath  later this week. . -Increase carvedilol 6.25 mg twice a day  - Will likely need LifeVest (vs ICD -but may be limited currently with chest hematoma). I dont think Life Vest will be effective with hematoma. Consult EP this week - Keep K. 4.0 Mg > 2.0  2. CAD with ischemic CM - No CP.  - ASA on hold due to hematoma - Continue b-blocker. On statin.   - On low dose entresto  - Increase spiro to 25 mg daily.  Renal function stable.  - Cath this week  3. Perforated stomach - s/p repair. Suspect due to CPR. Cannot exclude PUD though recent EGD ok.  - GSU following. Advancing diet.   4. Frequent PVCs - May be contributing to systolic dysfunction. NSVT /PVCs.  - On bb. Started carvedilol 7/28. Increase carvedilol 6.25 mg twice a day. PVCs around 2-4 per hour  May need amio - Keep potassium > 4 and Magnesium > 2  5. LV Thrombus:  Lamonte Sakai been off since 7/28 due to fall and chest wall hematoma.  Add SCDs for DVT prophylaxis   6. Chest wall hematoma with symptomatic anemia - Received 1PRBC on 7/29. Todays hgb 8.5   7. Hypokalemia/hypomagnesemia:  - K 3.3 Mag 1.6  Supp K and give Mag 4 grams   8. Acute delirium resolved  9. ETOH and tobacco abuse - has mild cirrhosis on previous CT.  - has cut back ETOH but hasn't quit - Counseled on need for cessation -  Watch for withdrawal. On folate and thiamine  10. Suspected Fall 08/08/16-  - left arm swollen with mildly decreased ROM> Will get humerus xray  Signed [name] Grinder, NP  08/11/2016, 9:57 AM    Patient seen and examined with [name] Grinder, NP. We discussed all aspects of the encounter. I agree with the assessment and plan as stated above.   He is well oriented today.   Still with significant soreness in chest and LUE. Has large chest wall hematoma. Dressing take down. Skin looks good. Will discuss with TCTS regarding possible evacuation.   Volume status looks good. PVCs starting to quiet down. Contiue  carvedilol. No further VT/VF. Supp electrolytes. Will need EP to see. Not currently candidate for ICD or Lifevest with chest wall hematoma. Will need cath midweek.   Being evaluated for CIR.  Place SCDs given inability to anti-cogulate  Glori Bickers, MD  7:22 PM

## 2016-08-11 NOTE — Progress Notes (Signed)
Nutrition Follow-up  DOCUMENTATION CODES:   Not applicable  INTERVENTION:   [employer] Instant Breakfast supplement BID on meal trays  Bedtime snack added   NUTRITION DIAGNOSIS:   Inadequate oral intake related to inability to eat as evidenced by NPO status.  Improving as diet advanced to Soft, appetite improving, adding snacks and supplements  GOAL:   Patient will meet greater than or equal to 90% of their needs  Progressing  MONITOR:   Vent status, Labs, Weight trends, Skin, I & O's  REASON FOR ASSESSMENT:   Ventilator    ASSESSMENT:   66 yo Male with unknown PMH who arrived by [facility] after witnessed collapse. Bystander CPR for 2-3 mins.  AED was placed and shocked once.  On EMS arrival, patient was in [place] 120 with pulses.  On arrival to ER, patient was unresponsive with GCS of 3 and agonal and therefore intubated for airway protection.  7/23 Ex lap with omental patch of stomach (perforated stomach) 7/27 Extubated, Pt did not receive TF while on vent 7/28 Diet advanced to CL  Tolerating liquids well, diet advanced to Soft this AM. Pt reports appetite improving although reports he has never had a big appetite. Pt reports he typically eats 2 meals per day, no breakfast just coffee. Lunch is 1/2 sandwich and dinner is steak/chicken and a side  No major changes in weight since admission  Labs: potassium 3.3, magnesium 1.6 Meds: MVI/minerals, mag sulfate, lasix, folic acid, KCl, thiamine  Diet Order:  DIET SOFT Room service appropriate? Yes; Fluid consistency: Thin  Skin:  Reviewed, no issues  Last BM:  no documented BM, abdomen soft  Height:   Ht Readings from Last 1 Encounters:  08/07/16 5\' 8"  (1.727 m)    Weight:   Wt Readings from Last 1 Encounters:  08/11/16 144 lb 6.4 oz (65.5 kg)    Ideal Body Weight:  70 kg  BMI:  Body mass index is 21.96 kg/m.  Estimated Nutritional Needs:   Kcal:  1800-2000 kcals  Protein:  90-100 g  Fluid:   >/= 1.8 L  EDUCATION NEEDS:   No education needs identified at this time  [place], [place], LDN [phone] Pager  [phone] Weekend/On-Call Pager

## 2016-08-11 NOTE — Progress Notes (Signed)
7 Days Post-Op   Subjective/Chief Complaint: Pt doing well today Tol PO +BMs   Objective: Vital signs in last 24 hours: Temp:  [97.7 F (36.5 C)-99.3 F (37.4 C)] 98.6 F (37 C) (07/30 0554) Pulse Rate:  [89-95] 92 (07/30 0554) Resp:  [18-20] 20 (07/30 0554) BP: (135-174)/(54-74) 153/69 (07/30 0554) SpO2:  [93 %-96 %] 93 % (07/30 0554) Weight:  [65.5 kg (144 lb 6.4 oz)] 65.5 kg (144 lb 6.4 oz) (07/30 0554) Last BM Date:  (pta)  Intake/Output from previous day: 07/29 0701 - 07/30 0700 In: 9233 [P.O.:1320; Blood:285; IV Piggyback:100] Out: 1985 [Urine:1425; Drains:560] Intake/Output this shift: No intake/output data recorded.  General appearance: alert and cooperative Cardio: regular rate and rhythm, S1, S2 normal, no murmur, click, rub or gallop GI: s/nt/nd, wound c/d/i  Lab Results:   Recent Labs  08/10/16 0745 08/11/16 0528  WBC 10.1 9.4  HGB 7.5* 8.5*  HCT 21.1* 25.0*  PLT 186 192   BMET  Recent Labs  08/10/16 0745 08/11/16 0528  NA 139 138  K 3.0* 3.3*  CL 108 107  CO2 23 24  GLUCOSE 106* 108*  BUN 22* 14  CREATININE 0.85 0.80  CALCIUM 7.5* 7.4*   PT/INR  Recent Labs  08/09/16 1114  LABPROT 17.2*  INR 1.39   ABG No results for input(s): PHART, HCO3 in the last 72 hours.  Invalid input(s): PCO2, PO2  Studies/Results: No results found.  Anti-infectives: Anti-infectives    Start     Dose/Rate Route Frequency Ordered Stop   08/06/16 1200  fluconazole (DIFLUCAN) IVPB 400 mg     400 mg 100 mL/hr over 120 Minutes Intravenous Every 24 hours 08/06/16 0959 08/10/16 1400   08/06/16 1100  Ampicillin-Sulbactam (UNASYN) 3 g in sodium chloride 0.9 % 100 mL IVPB     3 g 200 mL/hr over 30 Minutes Intravenous Every 6 hours 08/06/16 0959     08/05/16 1730  anidulafungin (ERAXIS) 100 mg in sodium chloride 0.9 % 100 mL IVPB  Status:  Discontinued     100 mg 78 mL/hr over 100 Minutes Intravenous Every 24 hours 08/04/16 1657 08/06/16 0959   08/05/16  0000  piperacillin-tazobactam (ZOSYN) IVPB 3.375 g  Status:  Discontinued     3.375 g 12.5 mL/hr over 240 Minutes Intravenous Every 8 hours 08/04/16 1717 08/06/16 0959   08/04/16 1800  piperacillin-tazobactam (ZOSYN) IVPB 3.375 g  Status:  Discontinued     3.375 g 100 mL/hr over 30 Minutes Intravenous To Surgery 08/04/16 1754 08/04/16 1848   08/04/16 1730  anidulafungin (ERAXIS) 200 mg in sodium chloride 0.9 % 200 mL IVPB     200 mg 78 mL/hr over 200 Minutes Intravenous  Once 08/04/16 1657 08/05/16 0222   08/04/16 1700  piperacillin-tazobactam (ZOSYN) IVPB 3.375 g  Status:  Discontinued     3.375 g 100 mL/hr over 30 Minutes Intravenous  Once 08/04/16 1655 08/06/16 0959      Assessment/Plan: s/p Procedure(s): EXPLORATORY LAPAROTOMY FREE AIR BIOPSY OF PERFORATED STOMACH ULCER REPAIR OF PERFORATED STOMACH ULCER WITH A OMENTAL PATCH (N/A)  Advance to soft diet Continue abx and PPI PT/OT Dressing changes for midline wound   LOS: 7 days    Rosario Jacks., Anne Hahn 08/11/2016

## 2016-08-11 NOTE — Consult Note (Signed)
Physical Medicine and Rehabilitation Consult  Reason for Consult:CIR Referring Physician: Florencia Reasons, MD   HPI: [name] [name] is a 66 y.o. male who collapsed at [facility] on 08/04/16 with question of seizure activity. He was shocked with AED and bystander CPR performed with return of pulse. He was intubated in ED, was noted to be hypertensive, tachycardic with distended abdomen. EKG showed evidence of prior MI and arrest presumed to be VT/VF induced. X rays done revealing large volume pneumoperitoneum and he was taken to OR for exploratory lap with repair of perforated stomach with omental patch by Dr. Grandville Silos. He tolerated extubation by 7/25 but has been confused and agitated. He required pressors due to hypotension and 2D echo done revealing akinesis of mid anteroseptal myocardium with medium size apical thrombus and large left pleural effusion.   He was started on IV heparin but developed chest wall pain with swelling and drop in H/H. CT chest revealed interval development of large hematoma along anterior left chest wall. Dr. Prescott Gum consulted and felt hematoma due to rib fractures and recommended discontinuation of Heparin and holding ASA for 3 days. He was infused with FFP and H/H being monitored. Dr. Mahalia Longest recommended cath this week with EP consult for likely LifeVest due to ICM.     Review of Systems  Respiratory: Positive for cough.   Cardiovascular:       MSK chest pain with coughing  Neurological: Negative.  Negative for weakness.  All other systems reviewed and are negative.  History reviewed. No pertinent past medical history. Past Surgical History:  Procedure Laterality Date  . CORONARY ANGIOPLASTY     "Dr. Vidal Schwalbe"  . LAPAROTOMY N/A 08/04/2016   Procedure: EXPLORATORY LAPAROTOMY FREE AIR BIOPSY OF PERFORATED STOMACH ULCER REPAIR OF PERFORATED STOMACH ULCER WITH A OMENTAL PATCH;  Surgeon: Georganna Skeans, MD;  Location: [place];  Service: General;  Laterality: N/A;    History reviewed. No pertinent family history of premature MI.  Social History:  reports that he has been smoking Cigarettes.  He has a 25.00 pack-year smoking history. He has quit using smokeless tobacco. His smokeless tobacco use included Chew. He reports that he does not use drugs. His alcohol history is not on file. Allergies: No Known Allergies Medications Prior to Admission  Medication Sig Dispense Refill  . losartan (COZAAR) 100 MG tablet Take 100 mg by mouth daily.    . metoprolol succinate (TOPROL-XL) 50 MG 24 hr tablet Take 50 mg by mouth 2 (two) times daily.    . rosuvastatin (CRESTOR) 10 MG tablet Take 10 mg by mouth daily.      Home: Home Living Family/patient expects to be discharged to:: Private residence Living Arrangements: Spouse/significant other Available Help at Discharge: Family, Available 24 hours/day Type of Home: House Home Access: Stairs to enter Technical brewer of Steps: 2 Home Layout: One level Biochemist, clinical: [place]: None  Lives With: Spouse  Functional History: Prior Function Level of Independence: Independent Comments: was driving; enjoyed gardening Functional Status:  Mobility: Bed Mobility Overal bed mobility: Needs Assistance Bed Mobility: Supine to Sit Supine to sit: Min assist General bed mobility comments: verbal cues for rolling, min assist for sidelying to sit Transfers Overall transfer level: Needs assistance Equipment used: Rolling walker (2 wheeled) Transfers: Sit to/from Stand Sit to Stand: Min assist General transfer comment: Stood with difficulty from low bed, stood easily from raised bed. Sat x2 with no warning      ADL:  ADL Overall ADL's : Needs assistance/impaired Eating/Feeding: Set up Eating/Feeding Details (indicate cue type and reason): liquids only Grooming: Set up, Sitting Upper Body Bathing: Set up, Supervision/ safety, Sitting Lower Body Bathing: Moderate assistance, Sit to/from  stand Upper Body Dressing : Minimal assistance, Sitting Lower Body Dressing: Moderate assistance, Sit to/from stand Toilet Transfer: Moderate assistance, BSC, Stand-pivot Toileting- Clothing Manipulation and Hygiene: Maximal assistance Functional mobility during ADLs: Moderate assistance, Cueing for safety  Cognition: Cognition Overall Cognitive Status: Within Functional Limits for tasks assessed Arousal/Alertness: Awake/alert Orientation Level: Oriented X4 Attention: Selective Selective Attention: Impaired Memory: Impaired Memory Impairment: Storage deficit, Decreased recall of new information, Prospective memory Awareness: Impaired Awareness Impairment: Anticipatory impairment Behaviors: Impulsive Safety/Judgment: Impaired Cognition Arousal/Alertness: Awake/alert Behavior During Therapy: WFL for tasks assessed/performed, Impulsive Overall Cognitive Status: Within Functional Limits for tasks assessed Area of Impairment: Orientation, Attention, Memory, Safety/judgement, Awareness, Problem solving Orientation Level: Disoriented to, Time Current Attention Level: Selective Memory: Decreased short-term memory Safety/Judgement: Decreased awareness of deficits Awareness: Emergent Problem Solving: Slow processing General Comments: wife states cognition is improving daily  Blood pressure 106/70, pulse 84, temperature 98.1 F (36.7 C), temperature source Oral, resp. rate 19, height 5\' 8"  (1.727 m), weight 65.5 kg (144 lb 6.4 oz), SpO2 96 %. Physical Exam  Vitals reviewed. Constitutional: He is oriented to person, place, and time. He appears well-developed and well-nourished.  HENT:  Head: Normocephalic and atraumatic.  Eyes: EOM are normal. Right eye exhibits no discharge. Left eye exhibits no discharge.  Neck: Normal range of motion. Neck supple.  Cardiovascular: Normal rate and regular rhythm.   Respiratory: Effort normal and breath sounds normal.  GI: Soft. Bowel sounds are  normal.  Musculoskeletal: He exhibits no edema or tenderness.  Neurological: He is alert and oriented to person, place, and time.  Motor: 4+/5 throughout Sensation intact to light touch  Skin: Skin is warm and dry.  Psychiatric: His speech is normal. His mood appears anxious. He is hyperactive. He expresses impulsivity.    Results for orders placed or performed during the hospital encounter of 08/04/16 (from the past 24 hour(s))  CBC     Status: Abnormal   Collection Time: 08/11/16  5:28 AM  Result Value Ref Range   WBC 9.4 4.0 - 10.5 K/uL   RBC 2.71 (L) 4.22 - 5.81 MIL/uL   Hemoglobin 8.5 (L) 13.0 - 17.0 g/dL   HCT 25.0 (L) 39.0 - 52.0 %   MCV 92.3 78.0 - 100.0 fL   MCH 31.4 26.0 - 34.0 pg   MCHC 34.0 30.0 - 36.0 g/dL   RDW 18.4 (H) 11.5 - 15.5 %   Platelets 192 150 - 400 K/uL  Comprehensive metabolic panel     Status: Abnormal   Collection Time: 08/11/16  5:28 AM  Result Value Ref Range   Sodium 138 135 - 145 mmol/L   Potassium 3.3 (L) 3.5 - 5.1 mmol/L   Chloride 107 101 - 111 mmol/L   CO2 24 22 - 32 mmol/L   Glucose, Bld 108 (H) 65 - 99 mg/dL   BUN 14 6 - 20 mg/dL   Creatinine, Ser 0.80 0.61 - 1.24 mg/dL   Calcium 7.4 (L) 8.9 - 10.3 mg/dL   Total Protein 4.0 (L) 6.5 - 8.1 g/dL   Albumin 2.0 (L) 3.5 - 5.0 g/dL   AST 54 (H) 15 - 41 U/L   ALT 28 17 - 63 U/L   Alkaline Phosphatase 34 (L) 38 - 126 U/L   Total Bilirubin  1.4 (H) 0.3 - 1.2 mg/dL   GFR calc non Af Amer >60 >60 mL/min   GFR calc Af Amer >60 >60 mL/min   Anion gap 7 5 - 15  Magnesium     Status: Abnormal   Collection Time: 08/11/16  5:28 AM  Result Value Ref Range   Magnesium 1.6 (L) 1.7 - 2.4 mg/dL   Dg Humerus Left  Result Date: 08/11/2016 CLINICAL DATA:  Two days of pain and swelling over the left numb status post fall. EXAM: LEFT HUMERUS - 2+ VIEW COMPARISON:  None in PACs FINDINGS: The humerus is subjectively adequately mineralized. There is no acute fracture. The observed portions of the shoulder and  elbow appear normal. The soft tissues of the arm exhibit no acute abnormalities. IMPRESSION: There is no acute or significant chronic bony abnormality of the left humerus. Electronically Signed   By: David  Martinique M.D.   On: 08/11/2016 12:33    Assessment/Plan: Diagnosis: Debility Labs independently reviewed.  Records reviewed and summated above.  1. Does the need for close, 24 hr/day medical supervision in concert with the patient's rehab needs make it unreasonable for this patient to be served in a less intensive setting? Yes  2. Co-Morbidities requiring supervision/potential complications: seizure (consider meds if necessary), Tachycardia (monitor in accordance with pain and increasing activity), confused and agitated, Hypotension, labile at present (monitor and provide prns in accordance with increased physical exertion and pain), ICM (meds, plan per Cards this week), left pleural effusion (monitor), hematoma along anterior left chest wall (cont to monitor) 3. Due to safety, disease management, pain management and patient education, does the patient require 24 hr/day rehab nursing? Yes 4. Does the patient require coordinated care of a physician, rehab nurse, PT (1-2 hrs/day, 5 days/week), OT (1-2 hrs/day, 5 days/week) and SLP (1-2 hrs/day, 5 days/week) to address physical and functional deficits in the context of the above medical diagnosis(es)? Yes Addressing deficits in the following areas: balance, endurance, locomotion, strength, transferring, bowel/bladder control, bathing, dressing, toileting and cognition 5. Can the patient actively participate in an intensive therapy program of at least 3 hrs of therapy per day at least 5 days per week? Yes 6. The potential for patient to make measurable gains while on inpatient rehab is excellent 7. Anticipated functional outcomes upon discharge from inpatient rehab are supervision  with PT, supervision with OT, supervision with SLP. 8. Estimated rehab  length of stay to reach the above functional goals is: 7-10 days. 9. Anticipated D/C setting: Home 10. Anticipated post D/C treatments: HH therapy and Home excercise program 11. Overall Rehab/Functional Prognosis: excellent  RECOMMENDATIONS: This patient's condition is appropriate for continued rehabilitative care in the following setting: CIR after completion of medical workup. Patient has agreed to participate in recommended program. Yes Note that insurance prior authorization may be required for reimbursement for recommended care.  Comment: Rehab Admissions Coordinator to follow up.  Delice Lesch, MD, [place], [place] 08/11/2016

## 2016-08-11 NOTE — NC FL2 (Signed)
[place] LEVEL OF CARE SCREENING TOOL     IDENTIFICATION  Patient Name: [name] [name] Birthdate: 1950-05-30 Sex: male Admission Date (Current Location): 08/04/2016  [facility] [place] Number:  Herbalist and Address:  The . [facility], [place] [address] [place], [place] [address]      Provider Number: [id number]  Attending Physician Name and Address:  Florencia Reasons, MD  Relative Name and Phone Number:       Current Level of Care: Hospital Recommended Level of Care: [place] Prior Approval Number:    Date Approved/Denied:   PASRR Number: [id number]  Discharge Plan: SNF    Current Diagnoses: Patient Active Problem List   Diagnosis Date Noted  . Frequent PVCs   . Cardiomyopathy, ischemic   . Chest wall trauma   . Acute respiratory failure ([place])   . Pneumoperitoneum 08/04/2016  . Cardiac arrest ([place])   . Syncope and collapse     Orientation RESPIRATION BLADDER Height & Weight     Self, Time, Situation, Place  Normal Continent, External catheter Weight: 144 lb 6.4 oz (65.5 kg) (c scale) Height:  5\' 8"  (172.7 cm)  BEHAVIORAL SYMPTOMS/MOOD NEUROLOGICAL BOWEL NUTRITION STATUS   (None)  (None) Continent Diet (Soft)  AMBULATORY STATUS COMMUNICATION OF NEEDS Skin   Extensive Assist Verbally Surgical wounds, Bruising                       Personal Care Assistance Level of Assistance  Bathing, Feeding, Dressing Bathing Assistance: Limited assistance Feeding assistance: Limited assistance Dressing Assistance: Limited assistance     Functional Limitations Info  Sight, Hearing, Speech Sight Info: Adequate Hearing Info: Adequate Speech Info: Adequate    SPECIAL CARE FACTORS FREQUENCY  PT (By licensed PT), OT (By licensed OT), Speech therapy     PT Frequency: 5 x week OT Frequency: 5 x week     Speech Therapy Frequency: 5 x week      Contractures Contractures Info: Not present     Additional Factors Info  Code Status, Allergies Code Status Info: Full Allergies Info: NKDA           Current Medications (08/11/2016):  This is the current hospital active medication list Current Facility-Administered Medications  Medication Dose Route Frequency Provider Last Rate Last Dose  . 0.9 %  sodium chloride infusion   Intravenous Continuous Raylene Miyamoto, MD 10 mL/hr at 08/07/16 0600    . 0.9 %  sodium chloride infusion  250 mL Intravenous PRN Desai, Rahul P, PA-C      . 0.9 %  sodium chloride infusion   Intravenous Once Florencia Reasons, MD      . 0.9 %  sodium chloride infusion   Intravenous Once Florencia Reasons, MD      . 0.9 %  sodium chloride infusion   Intravenous Once Judeth Horn, MD      . Ampicillin-Sulbactam (UNASYN) 3 g in sodium chloride 0.9 % 100 mL IVPB  3 g Intravenous Q6H Raylene Miyamoto, MD   Stopped at 08/11/16 1130  . carvedilol (COREG) tablet 6.25 mg  6.25 mg Oral BID WC Florencia Reasons, MD      . Chlorhexidine Gluconate Cloth 2 % PADS 6 each  6 each Topical Daily Raylene Miyamoto, MD   6 each at 08/10/16 1000  . docusate sodium (COLACE) capsule 100 mg  100 mg Oral BID Clovis Riley, MD      .  fentaNYL (SUBLIMAZE) injection 50 mcg  50 mcg Intravenous Q2H PRN Shearon Stalls, Rahul P, PA-C   50 mcg at 08/07/16 0336  . folic acid (FOLVITE) tablet 1 mg  1 mg Oral Daily Florencia Reasons, MD   1 mg at 08/11/16 1034  . furosemide (LASIX) tablet 40 mg  40 mg Oral Daily Bensimhon, Shaune Pascal, MD   40 mg at 08/11/16 1034  . multivitamin with minerals tablet 1 tablet  1 tablet Oral Daily Florencia Reasons, MD   1 tablet at 08/11/16 1034  . pantoprazole (PROTONIX) injection 40 mg  40 mg Intravenous Q12H Florencia Reasons, MD   40 mg at 08/11/16 1042  . pneumococcal 23 valent vaccine (PNU-IMMUNE) injection 0.5 mL  0.5 mL Intramuscular Tomorrow-1000 Raylene Miyamoto, MD      . potassium chloride SA (K-DUR,KLOR-CON) CR tablet 40 mEq  40 mEq Oral BID Florencia Reasons, MD   40 mEq at 08/11/16 1414  . rosuvastatin  (CRESTOR) tablet 10 mg  10 mg Oral q1800 Bensimhon, Shaune Pascal, MD   10 mg at 08/10/16 1700  . sacubitril-valsartan (ENTRESTO) 24-26 mg per tablet  1 tablet Oral BID Bensimhon, Shaune Pascal, MD   1 tablet at 08/11/16 1412  . sodium chloride flush (NS) 0.9 % injection 10-40 mL  10-40 mL Intracatheter Q12H Raylene Miyamoto, MD   10 mL at 08/10/16 2200  . sodium chloride flush (NS) 0.9 % injection 10-40 mL  10-40 mL Intracatheter PRN Raylene Miyamoto, MD   10 mL at 08/07/16 1606  . spironolactone (ALDACTONE) tablet 25 mg  25 mg Oral Daily Florencia Reasons, MD   25 mg at 08/11/16 1411  . thiamine (VITAMIN B-1) tablet 100 mg  100 mg Oral Daily Florencia Reasons, MD   100 mg at 08/11/16 1411     Discharge Medications: Please see discharge summary for a list of discharge medications.  Relevant Imaging Results:  Relevant Lab Results:   Additional Information SS#: [id number]  Candie Chroman, LCSW

## 2016-08-11 NOTE — Clinical Social Work Note (Signed)
Clinical Social Work Assessment  Patient Details  Name: [name] [name] MRN: [id number] Date of Birth: June 14, 1950  Date of referral:  08/11/16               Reason for consult:  Facility Placement, Discharge Planning                Permission sought to share information with:  Chartered certified accountant granted to share information::  Yes, Verbal Permission Granted  Name::        Agency::  SNF's  Relationship::     Contact Information:     Housing/Transportation Living arrangements for the past 2 months:  Single Family Home Source of Information:  Patient, Medical Team Patient Interpreter Needed:  None Criminal Activity/Legal Involvement Pertinent to Current Situation/Hospitalization:  No - Comment as needed Significant Relationships:  Spouse, Siblings Lives with:  Spouse Do you feel safe going back to the place where you live?  Yes Need for family participation in patient care:  Yes (Comment)  Care giving concerns:  PT recommending CIR. CSW initiating SNF backup plan.   Social Worker assessment / plan:  CSW met with patient. No supports at bedside. CSW introduced role and explained that discharge planning would be discussed. CSW explained possible barriers to CIR placement. Patient is agreeable to SNF placement if he is unable to go to CIR. "Whichever is cheaper." No further concerns. CSW encouraged patient to contact CSW as needed. CSW will continue to follow patient for support and facilitate discharge to SNF, if needed, once medically stable.  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 Patient's wife and brother supportive and involved in patient's care. Patient appreciated social work  intervention.  Patient/Family's Understanding of and Emotional Response to Diagnosis, Current Treatment, and Prognosis:  Patient has a good understanding of the reason for admission and his need for rehab prior to returning home. Patient appears happy with hospital care.  Emotional Assessment Appearance:  Appears stated age Attitude/Demeanor/Rapport:  Other (Pleasant) Affect (typically observed):  Accepting, Appropriate, Calm, Pleasant Orientation:  Oriented to Self, Oriented to Place, Oriented to  Time, Oriented to Situation Alcohol / Substance use:  Never Used Psych involvement (Current and /or in the community):  No (Comment)  Discharge Needs  Concerns to be addressed:  Care Coordination Readmission within the last 30 days:  No Current discharge risk:  Dependent with Mobility Barriers to Discharge:  Continued Medical Work up, [name], LCSW 08/11/2016, 3:48 PM

## 2016-08-12 ENCOUNTER — Encounter (HOSPITAL_COMMUNITY): Payer: Self-pay | Admitting: Physician Assistant

## 2016-08-12 DIAGNOSIS — M7981 Nontraumatic hematoma of soft tissue: Secondary | ICD-10-CM

## 2016-08-12 DIAGNOSIS — I4901 Ventricular fibrillation: Secondary | ICD-10-CM

## 2016-08-12 DIAGNOSIS — S20212D Contusion of left front wall of thorax, subsequent encounter: Secondary | ICD-10-CM

## 2016-08-12 DIAGNOSIS — S299XXD Unspecified injury of thorax, subsequent encounter: Secondary | ICD-10-CM

## 2016-08-12 LAB — BASIC METABOLIC PANEL
Anion gap: 8 (ref 5–15)
BUN: 11 mg/dL (ref 6–20)
CHLORIDE: 107 mmol/L (ref 101–111)
CO2: 21 mmol/L — AB (ref 22–32)
CREATININE: 0.76 mg/dL (ref 0.61–1.24)
Calcium: 7.6 mg/dL — ABNORMAL LOW (ref 8.9–10.3)
GFR calc non Af Amer: >60 mL/min (ref >60)
Glucose, Bld: 136 mg/dL — ABNORMAL HIGH (ref 65–99)
Potassium: 3.9 mmol/L (ref 3.5–5.1)
Sodium: 136 mmol/L (ref 135–145)

## 2016-08-12 LAB — CBC
HEMATOCRIT: 26.6 % — AB (ref 39.0–52.0)
HEMOGLOBIN: 9.1 g/dL — AB (ref 13.0–17.0)
MCH: 31.7 pg (ref 26.0–34.0)
MCHC: 34.2 g/dL (ref 30.0–36.0)
MCV: 92.7 fL (ref 78.0–100.0)
Platelets: 227 10*3/uL (ref 150–400)
RBC: 2.87 MIL/uL — ABNORMAL LOW (ref 4.22–5.81)
RDW: 17.9 % — AB (ref 11.5–15.5)
WBC: 11.8 10*3/uL — ABNORMAL HIGH (ref 4.0–10.5)

## 2016-08-12 LAB — MAGNESIUM: Magnesium: 1.7 mg/dL (ref 1.7–2.4)

## 2016-08-12 MED ORDER — PANTOPRAZOLE SODIUM 40 MG PO TBEC
40.0000 mg | DELAYED_RELEASE_TABLET | Freq: Two times a day (BID) | ORAL | Status: DC
  Administered 2016-08-12 – 2016-08-14 (×5): 40 mg via ORAL
  Filled 2016-08-12 (×7): qty 1

## 2016-08-12 MED ORDER — ASPIRIN 81 MG PO CHEW
81.0000 mg | CHEWABLE_TABLET | ORAL | Status: AC
  Administered 2016-08-13: 81 mg via ORAL
  Filled 2016-08-12: qty 1

## 2016-08-12 MED ORDER — SODIUM CHLORIDE 0.9% FLUSH
3.0000 mL | Freq: Two times a day (BID) | INTRAVENOUS | Status: DC
  Administered 2016-08-12: 3 mL via INTRAVENOUS

## 2016-08-12 MED ORDER — MAGNESIUM SULFATE 4 GM/100ML IV SOLN
4.0000 g | Freq: Once | INTRAVENOUS | Status: AC
  Administered 2016-08-12: 4 g via INTRAVENOUS
  Filled 2016-08-12: qty 100

## 2016-08-12 MED ORDER — SODIUM CHLORIDE 0.9 % IV SOLN: 250.0000 mL | INTRAVENOUS | Status: DC | PRN

## 2016-08-12 MED ORDER — SODIUM CHLORIDE 0.9% FLUSH: 3.0000 mL | INTRAVENOUS | Status: DC | PRN

## 2016-08-12 MED ORDER — CARVEDILOL 6.25 MG PO TABS
6.2500 mg | ORAL_TABLET | Freq: Two times a day (BID) | ORAL | Status: DC
  Administered 2016-08-12 – 2016-08-15 (×6): 6.25 mg via ORAL
  Filled 2016-08-12 (×6): qty 1

## 2016-08-12 MED ORDER — SODIUM CHLORIDE 0.9 % IV SOLN
INTRAVENOUS | Status: DC
  Administered 2016-08-12: 22:00:00 via INTRAVENOUS

## 2016-08-12 NOTE — Plan of Care (Signed)
Problem: Safety: Goal: Ability to remain free from injury will improve Outcome: Progressing No longer impulsive, up with standby assist and walker   Problem: Tissue Perfusion: Goal: Risk factors for ineffective tissue perfusion will decrease Outcome: Progressing Remains on room air with oxygen saturation in the 90's

## 2016-08-12 NOTE — Progress Notes (Signed)
Occupational Therapy Treatment Patient Details Name: [name] [name] MRN: [id number] DOB: December 03, 1950 Today's Date: 08/12/2016    History of present illness 66 year old male with unknown past medical history who arrived by [facility] after witnessed collapse in [place].  Bystander CPR for 2-3 mins.  VF/VT arrest. Pneumoperitoneum. Underwent EXPLORATORY LAPAROTOMY FREE AIR BIOPSY OF PERFORATED STOMACH ULCER REPAIR OF PERFORATED STOMACH ULCER 7/23. Pt intubated 7/23 and extubated 7/27.   OT comments  Pt making steady progress towards goals, completing seated ADLs with RN upon entering room. Pt completed additional standing ADLs at sink with MinGuard assist, initiating seated rest break PRN. Pt completed room level functional mobility using RW with MinGuard assist. Discharge recommendations have been updated to reflect Pt progress. Feel Pt will continue to benefit from continued acute OT services as well as additional [place] services to maximize Pt's safety and independence with ADLs and functional mobility for safe return home.    Follow Up Recommendations  Home health OT;Supervision/Assistance - 24 hour    Equipment Recommendations  3 in 1 bedside commode;Other (comment) (RW)          Precautions / Restrictions Precautions Precautions: Fall Restrictions Weight Bearing Restrictions: No       Mobility Bed Mobility Overal bed mobility: Needs Assistance Bed Mobility: Sit to Supine       Sit to supine: Min guard   General bed mobility comments: minGuard for safety  Transfers Overall transfer level: Needs assistance Equipment used: Rolling walker (2 wheeled) Transfers: Sit to/from Stand Sit to Stand: Min guard         General transfer comment: close guard for safety     Balance Overall balance assessment: Needs assistance Sitting-balance support: Feet supported;No upper extremity supported Sitting balance-Leahy Scale: Normal     Standing balance support: During  functional activity;No upper extremity supported Standing balance-Leahy Scale: Fair Standing balance comment: Pt stood at sink to complete grooming ADLs with close guard for safety                           ADL either performed or assessed with clinical judgement   ADL Overall ADL's : Needs assistance/impaired     Grooming: Set up;Min guard;Sitting;Standing Grooming Details (indicate cue type and reason): Pt alternating between sitting and standing to shave (sitting when in need of rest break), having just completed additional seated grooming ADLs upon therapist arrival to room          Upper Body Dressing : Minimal assistance;Sitting Upper Body Dressing Details (indicate cue type and reason): for gown management                  Functional mobility during ADLs: Min guard;Rolling walker General ADL Comments: Pt completing seated grooming ADLs upon arrival to room, engages in additional standing grooming ADLs of shaving, Pt stands to wash face and apply shaving cream prior to needing seateed rest break, completing remainder of task in sitting                       Cognition Arousal/Alertness: Awake/alert Behavior During Therapy: WFL for tasks assessed/performed Overall Cognitive Status: Within Functional Limits for tasks assessed                     Current Attention Level: Alternating           General Comments: Pt demonstrating improved cognition, able to recall completing [place] ("memory  test") with SLP earlier today; Pt engaging in conversation with therapist while completing grooming ADLs                          Pertinent Vitals/ Pain       Pain Assessment: No/denies pain                                                          Frequency  Min 2X/week        Progress Toward Goals  OT Goals(current goals can now be found in the care plan section)  Progress towards OT goals: Progressing toward  goals  Acute Rehab OT Goals Patient Stated Goal: To be able to walk his dog OT Goal Formulation: With patient Time For Goal Achievement: 08/24/16 Potential to Achieve Goals: Good  Plan Discharge plan needs to be updated                    AM-PAC PT "6 Clicks" Daily Activity     Outcome Measure   Help from another person eating meals?: None Help from another person taking care of personal grooming?: A Little Help from another person toileting, which includes using toliet, bedpan, or urinal?: A Little Help from another person bathing (including washing, rinsing, drying)?: A Lot Help from another person to put on and taking off regular upper body clothing?: A Little Help from another person to put on and taking off regular lower body clothing?: A Lot 6 Click Score: 17    End of Session Equipment Utilized During Treatment: Rolling walker  OT Visit Diagnosis: Unsteadiness on feet (R26.81);Muscle weakness (generalized) (M62.81);Other symptoms and signs involving cognitive function   Activity Tolerance Patient tolerated treatment well   Patient Left in bed;with call bell/phone within reach;with bed alarm set   Nurse Communication Mobility status        Time: 1422-1446 OT Time Calculation (min): 24 min  Charges: OT General Charges $OT Visit: 1 Procedure OT Treatments $Self Care/Home Management : 8-22 mins  Lou Cal, OT Pager [phone] 08/12/2016    Raymondo Band 08/12/2016, 3:54 PM

## 2016-08-12 NOTE — Clinical Social Work Note (Signed)
PT updated recommendations from CIR to [place].  CSW signing off. Consult again if any other social work needs arise.  Dayton Scrape, [place]

## 2016-08-12 NOTE — Progress Notes (Addendum)
PROGRESS NOTE  [name] [name] GUR:[id number] DOB: September 16, 1950 DOA: 08/04/2016 PCP: No primary care provider on file.   Brief summary:  Out of hospital Cardiac arrest, s/p cpr/defib, found to have  gi perforation s/p repair, rib fracture with large chest wall hematoma,t8 vertebral fracture, thought all from cpr, has LV thrombus, chf, delirium General surgery, cardiology, EP, thoracic surgery consulted Cardiac cath planned on 8/1,  CIR placement vs home health at discharge   HPI/Recap of past 24 hours:  Patient is sitting up in chair,   Left chest wall hematoma with pressure dressing on, left arm less edematous ,Urine output 1.8liters last 24hrs Confusion has much improved, not oriented to the month, but to year and place, very pleasant No fever, denies sob,  Wife at bedside  Assessment/Plan: Active Problems:   Pneumoperitoneum   Syncope and collapse   Acute respiratory failure (HCC)   Frequent PVCs   Cardiomyopathy, ischemic   Chest wall trauma   Perforated gastric ulcer (HCC)   Tachycardia   Hypotension due to drugs   Benign essential HTN   Labile blood pressure   Pleural effusion   Hematoma of chest wall    Cardiac arrest (presenting symptom):  -Out of hospital VF/VT arrest , witness - unclear etiology. Required 1 shock and 2 - 3 minutes CPR before ROSC. 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 -He was intubated and admitted to icu,  -He has improved , extubated and transferred to hospitalist service on 7/27  LV thrombus:  heparin drip held on 7/28 due to expanding large left chest wall hematoma Cardiology following  Systolic CHF  -Echo on 2/83 lvef 35-40% with grade 1 diastolic dysfunction -Ct chest on 7/28 "Moderate bilateral pleural effusions are noted with adjacent subsegmental atelectasis of both lower lobes. -cardiology /heart failure team consulted, currently he  is on lasix/spironolactone, coreg, entresto   Hypokalemia/hypomagnesemia: replace k/mag   PERFORATED STOMACH ULCER vs perforation from CPR -peunoperitoneum on initial ct scan, underwent emergency exploratory laparotomy  EXPLORATORY LAPAROTOMY FREE AIR BIOPSY OF PERFORATED STOMACH ULCER REPAIR OF PERFORATED STOMACH ULCER WITH A OMENTAL PATCH (N/A) on 7/23 -He was on iv ppi, now on oral ppi -He is started back on heart healthy diet - he was on erasix - He was on zosyn then unasyn since admission, day 9 abx as of 7/31, general surgery to decide abx duration    Large chest wall hematoma, rib fracture, acute blood loss anemia ( RN reported a fall on 7/27, however chest wall hematoma started prior to the suspected fall)  -Thoracic surgery Dr Lucianne Lei trigt consulted, conservation management, pressure dressing,   -s/p prbc transfusion/ffp, hold heparin drip  Stable probable nondisplaced fracture involving T8 vertebral body. He denies back pain.  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  ICU delirium? He also has h/o alcohol use improving  Code Status: full  Family Communication: patient and wife at bedside  Disposition Plan:  will need cardiac cath, CIR vs home health   Consultants:  pccm admit, transferred to [facility] hospitalist on 7/27  General surgery  cardiology /heart failure team  Thoracic surgery Dr Prescott Gum  Procedures:  Intubation/extubation  EXPLORATORY LAPAROTOMY FREE AIR BIOPSY OF PERFORATED STOMACH ULCER REPAIR OF PERFORATED STOMACH ULCER WITH A OMENTAL PATCH (N/A)  prbc /ffp transfusion  Left IJ placement and removal   .    Antibiotics:  Zosyn then unasyn  eraxis  then diflucan (last dose on 7/29)   Objective: BP 120/87 (BP Location: Left Arm)   Pulse 90   Temp 98.1 F (36.7 C) (Oral)   Resp 16   Ht 5\' 8"  (1.727 m)   Wt 64 kg (141 lb 1.5 oz)   SpO2 100%   BMI 21.45 kg/m   Intake/Output Summary (Last 24 hours) at 08/12/16  0757 Last data filed at 08/12/16 0551  Gross per 24 hour  Intake          1674.33 ml  Output             1868 ml  Net          -193.67 ml   Filed Weights   08/10/16 0700 08/11/16 0554 08/12/16 0550  Weight: 61.2 kg (135 lb) 65.5 kg (144 lb 6.4 oz) 64 kg (141 lb 1.5 oz)    Exam:   General:   NAD, very pleasant, chest wall hematoma/echcymosis stable/improving  Cardiovascular: sinus tachycardia resolved, now RRR with ectopic beats  Respiratory: diminished at basis, no wheezing, no rhonchi  Abdomen: potst op changes, dressing intact, soft, nondistended, drain in place with serosanguinous fluids, positive BS  Musculoskeletal: No lower extremity Edema, left arm edema is improving  Neuro: slightly confused about the month, oriented to year/place/person  Data Reviewed: Basic Metabolic Panel:  Recent Labs Lab 08/06/16 0408 08/07/16 0232 08/08/16 0425 08/09/16 0526 08/10/16 0745 08/11/16 0528 08/12/16 0404  NA 136 138 139 139 139 138 136  K 3.7 3.8 3.2* 3.6 3.0* 3.3* 3.9  CL 109 107 107 106 108 107 107  CO2 22 24 23 23 23 24  21*  GLUCOSE 112* 84 101* 134* 106* 108* 136*  BUN 11 10 13  24* 22* 14 11  CREATININE 0.73 0.79 0.79 0.93 0.85 0.80 0.76  CALCIUM 7.9* 7.8* 7.9* 7.9* 7.5* 7.4* 7.6*  MG 2.4 1.7  --  2.3 1.8 1.6* 1.7  PHOS 2.8 2.4*  --   --   --   --   --    Liver Function Tests:  Recent Labs Lab 08/05/16 0930 08/06/16 0408 08/10/16 0745 08/11/16 0528  AST 56* 56* 58* 54*  ALT 19 19 27 28   ALKPHOS 30* 24* 31* 34*  BILITOT 1.5* 1.3* 1.3* 1.4*  PROT 4.9* 4.5* 4.2* 4.0*  ALBUMIN 3.0* 2.6* 2.2* 2.0*   No results for input(s): LIPASE, AMYLASE in the last 168 hours. No results for input(s): AMMONIA in the last 168 hours. CBC:  Recent Labs Lab 08/08/16 0425 08/09/16 0526 08/09/16 1959 08/10/16 0745 08/11/16 0528 08/12/16 0404  WBC 8.3 14.5*  --  10.1 9.4 11.8*  HGB 9.5* 6.8* 8.2* 7.5* 8.5* 9.1*  HCT 27.3* 19.8* 22.8* 21.1* 25.0* 26.6*  MCV 97.8 98.0   --  92.5 92.3 92.7  PLT 199 268  --  186 192 227   Cardiac Enzymes:   No results for input(s): CKTOTAL, CKMB, CKMBINDEX, TROPONINI in the last 168 hours. BNP (last 3 results) No results for input(s): BNP in the last 8760 hours.  ProBNP (last 3 results) No results for input(s): PROBNP in the last 8760 hours.  CBG:  Recent Labs Lab 08/08/16 2132 08/09/16 0730 08/09/16 1125 08/09/16 1632 08/09/16 2114  GLUCAP 159* 147* 128* 136* 127*    Recent Results (from the past 240 hour(s))  MRSA PCR Screening     Status: None   Collection Time: 08/04/16  7:03 PM  Result Value Ref Range Status   MRSA by PCR NEGATIVE  NEGATIVE Final    Comment:        The GeneXpert MRSA Assay (FDA approved for NASAL specimens only), is one component of a comprehensive MRSA colonization surveillance program. It is not intended to diagnose MRSA infection nor to guide or monitor treatment for MRSA infections.   Culture, blood (Routine X 2) w Reflex to ID Panel     Status: None   Collection Time: 08/04/16  7:30 PM  Result Value Ref Range Status   Specimen Description BLOOD LEFT HAND  Final   Special Requests IN PEDIATRIC BOTTLE Blood Culture adequate volume  Final   Culture NO GROWTH 5 DAYS  Final   Report Status 08/09/2016 FINAL  Final  Culture, blood (Routine X 2) w Reflex to ID Panel     Status: None   Collection Time: 08/04/16  7:42 PM  Result Value Ref Range Status   Specimen Description BLOOD LEFT WRIST  Final   Special Requests IN PEDIATRIC BOTTLE Blood Culture adequate volume  Final   Culture NO GROWTH 5 DAYS  Final   Report Status 08/09/2016 FINAL  Final     Studies: Dg Humerus Left  Result Date: 08/11/2016 CLINICAL DATA:  Two days of pain and swelling over the left numb status post fall. EXAM: LEFT HUMERUS - 2+ VIEW COMPARISON:  None in PACs FINDINGS: The humerus is subjectively adequately mineralized. There is no acute fracture. The observed portions of the shoulder and elbow appear  normal. The soft tissues of the arm exhibit no acute abnormalities. IMPRESSION: There is no acute or significant chronic bony abnormality of the left humerus. Electronically Signed   By: David  Martinique M.D.   On: 08/11/2016 12:33    Scheduled Meds: . carvedilol  6.25 mg Oral BID WC  . Chlorhexidine Gluconate Cloth  6 each Topical Daily  . docusate sodium  100 mg Oral BID  . folic acid  1 mg Oral Daily  . furosemide  40 mg Oral Daily  . multivitamin with minerals  1 tablet Oral Daily  . pantoprazole (PROTONIX) IV  40 mg Intravenous Q12H  . pneumococcal 23 valent vaccine  0.5 mL Intramuscular Tomorrow-1000  . potassium chloride  40 mEq Oral BID  . rosuvastatin  10 mg Oral q1800  . sacubitril-valsartan  1 tablet Oral BID  . sodium chloride flush  10-40 mL Intracatheter Q12H  . spironolactone  25 mg Oral Daily  . thiamine  100 mg Oral Daily    Continuous Infusions: . sodium chloride 10 mL/hr at 08/07/16 0600  . sodium chloride    . sodium chloride    . sodium chloride    . sodium chloride    . ampicillin-sulbactam (UNASYN) IV 3 g (08/12/16 0601)     Time spent: 80mins  Stevey Stapleton MD, PhD  [facility] Pager [phone]. If 7PM-7AM, please contact night-coverage at [email or web address], password [facility] 08/12/2016, 7:57 AM  LOS: 8 days

## 2016-08-12 NOTE — Progress Notes (Addendum)
      [address]       [place] [address]             [phone]       8 Days Post-Op Procedure(s) (LRB): EXPLORATORY LAPAROTOMY FREE AIR BIOPSY OF PERFORATED STOMACH ULCER REPAIR OF PERFORATED STOMACH ULCER WITH A OMENTAL PATCH (N/A)  Subjective: Patient requesting to "clean up and shave this am". He has pain left anterior chest (hematoma)  Objective: Vital signs in last 24 hours: Temp:  [98.1 F (36.7 C)-98.3 F (36.8 C)] 98.1 F (36.7 C) (07/31 0550) Pulse Rate:  [84-90] 90 (07/31 0550) Cardiac Rhythm: Normal sinus rhythm (07/30 1905) Resp:  [16-19] 16 (07/31 0550) BP: (102-126)/(69-87) 120/87 (07/31 0550) SpO2:  [96 %-100 %] 100 % (07/31 0550) Weight:  [64 kg (141 lb 1.5 oz)] 64 kg (141 lb 1.5 oz) (07/31 0550)      Intake/Output from previous day: 07/30 0701 - 07/31 0700 In: 1674.3 [P.O.:240; I.V.:784.3; IV Piggyback:600] Out: 1868 [Urine:1850; Drains:18]   Physical Exam:  Cardiovascular: RRR Pulmonary: Clear to auscultation bilaterally   Lab Results: CBC: Recent Labs  08/11/16 0528 08/12/16 0404  WBC 9.4 11.8*  HGB 8.5* 9.1*  HCT 25.0* 26.6*  PLT 192 227   BMET:  Recent Labs  08/11/16 0528 08/12/16 0404  NA 138 136  K 3.3* 3.9  CL 107 107  CO2 24 21*  GLUCOSE 108* 136*  BUN 14 11  CREATININE 0.80 0.76  CALCIUM 7.4* 7.6*    PT/INR:  Recent Labs  08/09/16 1114  LABPROT 17.2*  INR 1.39   ABG:  INR: Will add last result for INR, ABG once components are confirmed Will add last 4 CBG results once components are confirmed  Assessment/Plan:  1. CV - PVCs/NSVT previously. HR in the 90's. 2.  Pulmonary - Rib fracture with large hematoma anterior left chest wall. Per Dr. Prescott Gum, will monitor hematoma for now, but may need drainage. 3. Anemia-H and H stable at 9.1 and 26.6  ZIMMERMAN,DONIELLE MPA-C 08/12/2016,8:55 AM Following hematoma of chest wall  P Prescott Gum

## 2016-08-12 NOTE — Progress Notes (Signed)
Physical Therapy Treatment Patient Details Name: [name] [name] MRN: [id number] DOB: June 16, 1950 Today's Date: 08/12/2016    History of Present Illness 66 year old male with unknown past medical history who arrived by [facility] after witnessed collapse in [place].  Bystander CPR for 2-3 mins.  VF/VT arrest. Pneumoperitoneum. Underwent EXPLORATORY LAPAROTOMY FREE AIR BIOPSY OF PERFORATED STOMACH ULCER REPAIR OF PERFORATED STOMACH ULCER 7/23. Pt intubated 7/23 and extubated 7/27.    PT Comments    Pt presented awake and alert sitting in chair, eager to get up and try walking. Min guard assist needed for all transfers and mobility for safety. Cueing needed for hand placement when standing and maintaining upright posture during ambulation. Pt was able to ambulate down and back to his room with ease using RW.  Much improved from last session;  Recommend d/c to home with HHPT and HHOT due to improvement in transfers, mobility and exercise tolerance. Discussed new d/c recommendation with pt and wife, and both were accepting and excited of the updated recommendation to d/c home. Case Manager and Rehab Admission Coordinator have been notified of d/c changes.  Follow Up Recommendations  Home health PT;Other (comment) (Home health OT)     Equipment Recommendations  3in1 (PT);Rolling walker with 5" wheels;Other (comment) (Shower seat)    Recommendations for Other Services       Precautions / Restrictions Precautions Precautions: Fall Restrictions Weight Bearing Restrictions: No    Mobility  Bed Mobility                  Transfers Overall transfer level: Needs assistance Equipment used: Rolling walker (2 wheeled) Transfers: Sit to/from Stand Sit to Stand: Min guard         General transfer comment: Not a lot of control when sitting down; Cues for hand placement and safety  Ambulation/Gait Ambulation/Gait assistance: Min guard Ambulation Distance (Feet): 175  Feet Assistive device: Rolling walker (2 wheeled) Gait Pattern/deviations: Decreased step length - right;Decreased step length - left  Noted dependence on UE support for steadiness in RW       Stairs            Wheelchair Mobility    Modified Rankin (Stroke Patients Only)       Balance Overall balance assessment: Needs assistance Sitting-balance support: Feet supported;No upper extremity supported Sitting balance-Leahy Scale: Normal Sitting balance - Comments: Normal sitting balance, able to lean laterally with control.   Standing balance support: Bilateral upper extremity supported Standing balance-Leahy Scale: Good Standing balance comment: Stood with assist of RW                            Cognition Arousal/Alertness: Awake/alert Behavior During Therapy: WFL for tasks assessed/performed Overall Cognitive Status: Within Functional Limits for tasks assessed                                        Exercises      General Comments  Educated pt on benefits of incentive spirometry device and demonstrated how to use it. Recommended him to use it 10x/day.       Pertinent Vitals/Pain Pain Assessment: 0-10 Pain Score: 0-No pain (Only pain is with coughing) Pain Location:  (abdomen with coughing)    Home Living  Wife available 24/7 to help/assist when needed. 2-3 stairs to enter the house. Shower and  toilet easily accessible; need 3-1 toilet and shower seat for assistance.                     Prior Function            PT Goals (current goals can now be found in the care plan section) Acute Rehab PT Goals Patient Stated Goal: To be able to walk his dog PT Goal Formulation: With patient/family Potential to Achieve Goals: Good Progress towards PT goals: Progressing toward goals    Frequency    Min 3X/week      PT Plan Discharge plan needs to be updated (Update d/c to home with HHPT and possible HHOT)    Co-evaluation               AM-PAC PT "6 Clicks" Daily Activity  Outcome Measure  Difficulty turning over in bed (including adjusting bedclothes, sheets and blankets)?: None Difficulty moving from lying on back to sitting on the side of the bed? : A Little Difficulty sitting down on and standing up from a chair with arms (e.g., wheelchair, bedside commode, etc,.)?: A Little Help needed moving to and from a bed to chair (including a wheelchair)?: A Little Help needed walking in hospital room?: A Little Help needed climbing 3-5 steps with a railing? : A Lot 6 Click Score: 18    End of Session Equipment Utilized During Treatment: Gait belt Activity Tolerance: Patient tolerated treatment well;No increased pain Patient left: in chair;with call bell/phone within reach;with chair alarm set;with family/visitor present;Other (comment) (Camera Sitter, LUE propped up on pillows to help with swelling) Nurse Communication: Other (comment) (Can give medications; change of plan for d/c) PT Visit Diagnosis: Unsteadiness on feet (R26.81);Muscle weakness (generalized) (M62.81);History of falling (Z91.81)     Time: 1696-7893 PT Time Calculation (min) (ACUTE ONLY): 35 min  Charges:       08/12/16 1100  PT Time Calculation  PT Start Time (ACUTE ONLY) 0957  PT Stop Time (ACUTE ONLY) 1032  PT Time Calculation (min) (ACUTE ONLY) 35 min  PT General Charges  $$ ACUTE PT VISIT 1 Visit  PT Treatments  $Gait Training 23-37 mins                     G Codes:       Shella Maxim, SPT Acute Rehabilitation Services Office: [phone]   Roderic Palau 08/12/2016, 11:49 AM   I was present during the PT session and agree with patient status and findings as outlined by Cristie Hem, SPT.  Roney Marion, [place]  Acute Rehabilitation Services Pager [phone] Office [phone]

## 2016-08-12 NOTE — Progress Notes (Signed)
Discussed with therapy. Pt has progressed well and Home health is now recommended when pt medically ready for d/c. Not in need of an inpt rehab admit at this time.  I will notify RN CM. We will sign off. [phone]

## 2016-08-12 NOTE — Progress Notes (Signed)
  Speech Language Pathology Treatment: Cognitive-Linquistic  Patient Details Name: [name] [name] MRN: [id number] DOB: Apr 07, 1950 Today's Date: 08/12/2016 Time: 1200-1230 SLP Time Calculation (min) (ACUTE ONLY): 30 min  Assessment / Plan / Recommendation Clinical Impression  Pt's cognition has improved, with fewer bouts of confusion, improved orientation and insight.  MOCA administered, and pt demonstrated an improved score, increasing from 19/30 to 25/30.  Areas of deficit remain primarily in working memory.  However, pt was better able to recall and sustain attention to task instructions.  He was able to shift attention without cues.  Wife present; agrees MS is much better.  Will follow for further education/safety as needed.  HPI HPI: 66 y.o.maleadmitted 7/23 after collapsed at [facility] and suffered VF/VT arrest. Required 1 shock and 2 - 3 minutes CPR before ROSC. Brought to ED where he was intubated and incidentally found to have massive pneumoperitoneum. ETT 7/23-25. Ex lap 7/23. Persisting acute encephalopathy;  cognitive assessment ordered.  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       SLP Plan  Continue with current plan of care       Recommendations                   Follow up Recommendations: None Plan: Continue with current plan of care       GO                Juan Quam Laurice 08/12/2016, 12:59 PM

## 2016-08-12 NOTE — Progress Notes (Signed)
Patient stable during 7 a to 7 p shift, BP soft earlier in the shift but back up this evening.  PAtient with no complaints of pain.  Sat up in chair majority of shift, washed himself up at sink, completely alert and oriented other than still struggles with the year.  Signed consent for his cardiac cath tomorrow and voices understanding that he is nothing by mouth after midnight.

## 2016-08-12 NOTE — Progress Notes (Signed)
I cosign Jennifer Stephens, RN's assessment, care plan, education, medication administration, and intake/output. 

## 2016-08-12 NOTE — Progress Notes (Signed)
Progress Note  Patient Name: [name] [name] Date of Encounter: 08/12/2016  Primary Cardiologist: New  Subjective  Yesterday spiro was increased 25 mg daily and carvedilol was increased to 6.25 mg twice a day. Also had increased LUE edema. Xray humerus without evidence of fracture.   Denies SOB. Complaining of discomfort when he coughs.     Inpatient Medications    Scheduled Meds: . carvedilol  6.25 mg Oral BID WC  . Chlorhexidine Gluconate Cloth  6 each Topical Daily  . docusate sodium  100 mg Oral BID  . folic acid  1 mg Oral Daily  . furosemide  40 mg Oral Daily  . multivitamin with minerals  1 tablet Oral Daily  . pantoprazole (PROTONIX) IV  40 mg Intravenous Q12H  . pneumococcal 23 valent vaccine  0.5 mL Intramuscular Tomorrow-1000  . potassium chloride  40 mEq Oral BID  . rosuvastatin  10 mg Oral q1800  . sacubitril-valsartan  1 tablet Oral BID  . sodium chloride flush  10-40 mL Intracatheter Q12H  . spironolactone  25 mg Oral Daily  . thiamine  100 mg Oral Daily   Continuous Infusions: . sodium chloride 10 mL/hr at 08/07/16 0600  . sodium chloride    . sodium chloride    . sodium chloride    . sodium chloride    . ampicillin-sulbactam (UNASYN) IV 3 g (08/12/16 0601)   PRN Meds: sodium chloride, fentaNYL (SUBLIMAZE) injection, sodium chloride flush   Vital Signs    Vitals:   08/11/16 1038 08/11/16 1312 08/11/16 2000 08/12/16 0550  BP: 102/69 106/70 126/77 120/87  Pulse: 85 84 84 90  Resp:  19 18 16   Temp:  98.1 F (36.7 C) 98.3 F (36.8 C) 98.1 F (36.7 C)  TempSrc:  Oral Oral Oral  SpO2:  96% 100% 100%  Weight:    141 lb 1.5 oz (64 kg)  Height:        Intake/Output Summary (Last 24 hours) at 08/12/16 0912 Last data filed at 08/12/16 0551  Gross per 24 hour  Intake          1674.33 ml  Output             1868 ml  Net          -193.67 ml   Filed Weights   08/10/16 0700 08/11/16 0554 08/12/16 0550  Weight: 135 lb (61.2 kg) 144 lb 6.4  oz (65.5 kg) 141 lb 1.5 oz (64 kg)    Telemetry   Personally reviewed. NSR occasional PVCs.     Physical Exam  General:  Well appearing. No resp difficulty. Sitting in the chair. HEENT: normal Neck: supple. JVP 5-6. Carotids 2+ bilat; no bruits. No lymphadenopathy or thryomegaly appreciated. Cor: PMI nondisplaced. Regular rate & rhythm. No rubs, gallops or murmurs. L upper chest hematoma with dry dressing intact  Lungs: clear Abdomen: soft, nontender, nondistended. No hepatosplenomegaly. No bruits or masses. Good bowel sounds. Extremities: no cyanosis, clubbing, rash, LUE 2+ edema Neuro: alert & orientedx3, cranial nerves grossly intact. moves all 4 extremities w/o difficulty. Affect pleasant Skin: L flank /LUE ecchymotic    Labs    Chemistry Recent Labs Lab 08/06/16 0408  08/10/16 0745 08/11/16 0528 08/12/16 0404  NA 136  < > 139 138 136  K 3.7  < > 3.0* 3.3* 3.9  CL 109  < > 108 107 107  CO2 22  < > 23 24 21*  GLUCOSE 112*  < > 106*  108* 136*  BUN 11  < > 22* 14 11  CREATININE 0.73  < > 0.85 0.80 0.76  CALCIUM 7.9*  < > 7.5* 7.4* 7.6*  PROT 4.5*  --  4.2* 4.0*  --   ALBUMIN 2.6*  --  2.2* 2.0*  --   AST 56*  --  58* 54*  --   ALT 19  --  27 28  --   ALKPHOS 24*  --  31* 34*  --   BILITOT 1.3*  --  1.3* 1.4*  --   GFRNONAA >60  < > >60 >60 >60  GFRAA >60  < > >60 >60 >60  ANIONGAP 5  < > 8 7 8   < > = values in this interval not displayed.   Hematology  Recent Labs Lab 08/10/16 0745 08/11/16 0528 08/12/16 0404  WBC 10.1 9.4 11.8*  RBC 2.28* 2.71* 2.87*  HGB 7.5* 8.5* 9.1*  HCT 21.1* 25.0* 26.6*  MCV 92.5 92.3 92.7  MCH 32.9 31.4 31.7  MCHC 35.5 34.0 34.2  RDW 16.7* 18.4* 17.9*  PLT 186 192 227    Cardiac Enzymes No results for input(s): TROPONINI in the last 168 hours.  No results for input(s): TROPIPOC in the last 168 hours.    Radiology    Dg Humerus Left  Result Date: 08/11/2016 CLINICAL DATA:  Two days of pain and swelling over the left  numb status post fall. EXAM: LEFT HUMERUS - 2+ VIEW COMPARISON:  None in PACs FINDINGS: The humerus is subjectively adequately mineralized. There is no acute fracture. The observed portions of the shoulder and elbow appear normal. The soft tissues of the arm exhibit no acute abnormalities. IMPRESSION: There is no acute or significant chronic bony abnormality of the left humerus. Electronically Signed   By: David  Martinique M.D.   On: 08/11/2016 12:33    Cardiac Studies   Transthoracic Echocardiogram  08/06/2016  Study Conclusions  - Left ventricle: The cavity size was normal. There was mild focal   basal hypertrophy of the septum. Systolic function was moderately   reduced. The estimated ejection fraction was in the range of 35%   to 40%. There is akinesis of the apical myocardium. There is   akinesis of the apicalanterior, lateral, inferolateral, and   inferior myocardium. There is akinesis of the midanteroseptal   myocardium. There was an increased relative contribution of   atrial contraction to ventricular filling. Doppler parameters are   consistent with abnormal left ventricular relaxation (grade 1   diastolic dysfunction). There was an apparent, medium-sized,   apicalthrombus. - Pulmonary arteries: Systolic pressure could not be accurately   estimated. - Pericardium, extracardiac: There was a large left pleural   effusion.   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 CT chest also showed atherosclerosis and mildly displaced anterior left second rib fracture as well as stable probable nondisplaced fracture involving T8 vertebral body. Echo with EF 35-40% with LV thrombus  Developed large chet wall hematoma on 7/27 in setting of AC and fall.   Assessment & Plan     1. Cardiac ArrestVT/VF:  --Suspect primary event was VT/VF arrest in setting of  known ischemic CM (MI in late 80-90. Cared for by Dr. Doreatha Lew. No f/u since. Reports cath at that time with what sounds like POBA) - suspect perforated stomach and rib fx sequale  from CPR - no evidence ACS. Trop flat at 1.0. ECG ok -ECHO EF 35-40%. - Cath later this week. Increase carvedilol 9.375 mg twice a day.  - Will likely need LifeVest (vs ICD -but may be limited currently with chest hematoma). I dont think Life Vest will be effective with hematoma. Consult EP.  - Keep K. 4.0 Mg > 2.0  2. CAD with ischemic CM - No CP.  - ASA on hold due to hematoma - Volume status stable.  - Continue b-blocker.Ihcrease as above.  On statin.   - On low dose entresto  - Continue spiro to 25 mg daily.  Renal function stable.   - Cath this week  3. Perforated stomach - s/p repair. Suspect due to CPR. Cannot exclude PUD though recent EGD ok.  - GSU following. Advancing diet.   4. Frequent PVCs - May be contributing to systolic dysfunction. NSVT /PVCs.  - On bb. Started carvedilol 7/28 and increased carvedilol 6.25 mg twice a day. Occasional PVCs.  - Keep potassium > 4 and Magnesium > 2  5. LV Thrombus:  Lamonte Sakai been off since 7/28 due to fall and chest wall hematoma.   SCDs for DVT prophylaxis   6. Chest wall hematoma with symptomatic anemia - Received 1PRBC on 7/29. Todays hgb 9.1   7. Hypokalemia/hypomagnesemia:  -K 3.9 Mag 1.7  Give 4 grams Mag --yesterday he only received 2 grams Mag but had 4 grams ordered.   8. Acute delirium resolved  9. ETOH and tobacco abuse - has mild cirrhosis on previous CT.  - has cut back ETOH but hasn't quit - Counseled on need for cessation -  Watch for withdrawal. On folate and thiamine  10. Suspected Fall 08/08/16-  - left arm swollen with mildly decreased ROM> Xray L humerus --no fracture.  -Elevate LUE.    Darrick Grinder, NP  08/12/2016, 9:12 AM    Patient seen and examined with Darrick Grinder, NP. We discussed all aspects of the encounter. I agree with the  assessment and plan as stated above.   Continues to improve. Chest wall hematoma seems to be getting smaller.  Delirium resolved. PVCs improving.  Hgb stable.   Will plan R/L cath tomorrow. Consult EP to discuss ICD vs LifeVest and how to handle in setting of chest wall hematoma.   Glori Bickers, MD  12:14 PM

## 2016-08-12 NOTE — Consult Note (Signed)
Cardiology Consultation:   Patient ID: [name] [name]; [id number]; [birth year]   Admit date: 08/04/2016 Date of Consult: 08/12/2016  Primary Care Provider: Patient, No Pcp Per Primary Cardiologist: none (very remotely Dr. Doreatha Lew) Primary Electrophysiologist:  none   Patient Profile:   [name] [name] is a 66 y.o. male with a hx of HTN, CAD, HLD, smoker, BPH, admitted to [facility] 08/04/16 after a syncopal event/presumed VF arrest with reports of AED shock,  who is being seen today for the evaluation of cardiac arrest at the request of Dr. Aundra Dubin.  History of Present Illness:   Mr. Boule was admitted 08/04/16 after collapsing at [facility], reportedly a bystander placed AED and shock was advised and given as well as CPR, On EMS arrival, patient was in [place] 120 with pulses.  On arrival to ER, patient was unresponsive with GCS of 3 and agonal and therefore intubated for airway protection.  Initial labs with WBC 17k, glucose 142, K 3.3.  Cardiology consulted for concern of ACS with EKG showing prior anterior and inferior infarcts.   Abd noted to be firm and distended.  CXR shows massive pneumoperitoneum.  He went to OR same day with finding of perforated stomach, omental patch placed, he was also found with LV thrombus, eventually heparin started, though 7/78/24 complicated with large chest wall hematoma, a/c stopped.  Waxing/waning mental status sounds like it is improving, he is out of the ICU, tolerating PO.  The patient has no prersonal recollection of the events, even the day at all.  He though reports he had quit drinking and he and his wife only a few months before had decided to live healthier and had started walking, eating better.  He reports heavy physical work at his home/property, denied feeling any kind of CP, SOB, DOE in the day/s months leading to the event, no near syncope or syncope.  Today he reports discomfort at his chest wall hematoma and a abdominal surgical site, though  improving daily  Past Medical History:  Diagnosis Date  . Coronary artery disease    1980's-90's MI with perhaps angioplasty  . Hypertension     Past Surgical History:  Procedure Laterality Date  . CORONARY ANGIOPLASTY     "Dr. Vidal Schwalbe"  . LAPAROTOMY N/A 08/04/2016   Procedure: EXPLORATORY LAPAROTOMY FREE AIR BIOPSY OF PERFORATED STOMACH ULCER REPAIR OF PERFORATED STOMACH ULCER WITH A OMENTAL PATCH;  Surgeon: Georganna Skeans, MD;  Location: [facility];  Service: General;  Laterality: N/A;     Inpatient Medications: Scheduled Meds: . carvedilol  6.25 mg Oral BID WC  . Chlorhexidine Gluconate Cloth  6 each Topical Daily  . docusate sodium  100 mg Oral BID  . folic acid  1 mg Oral Daily  . furosemide  40 mg Oral Daily  . multivitamin with minerals  1 tablet Oral Daily  . pantoprazole  40 mg Oral BID  . pneumococcal 23 valent vaccine  0.5 mL Intramuscular Tomorrow-1000  . potassium chloride  40 mEq Oral BID  . rosuvastatin  10 mg Oral q1800  . sacubitril-valsartan  1 tablet Oral BID  . sodium chloride flush  10-40 mL Intracatheter Q12H  . spironolactone  25 mg Oral Daily  . thiamine  100 mg Oral Daily   Continuous Infusions: . sodium chloride 10 mL/hr at 08/07/16 0600  . sodium chloride    . sodium chloride    . sodium chloride    . sodium chloride    . ampicillin-sulbactam (UNASYN) IV Stopped (  08/12/16 1236)   PRN Meds: sodium chloride, fentaNYL (SUBLIMAZE) injection, sodium chloride flush  Allergies:   No Known Allergies  Social History:   Social History   Social History  . Marital status: Married    Spouse name: N/A  . Number of children: N/A  . Years of education: N/A   Occupational History  . Not on file.   Social History Main Topics  . Smoking status: Current Every Day Smoker    Packs/day: 1.00    Years: 25.00    Types: Cigarettes  . Smokeless tobacco: Former Systems developer    Types: Chew  . Alcohol use Not on file  . Drug use: No  . Sexual activity: Not  Currently   Other Topics Concern  . Not on file   Social History Narrative  . No narrative on file    Family History:   The patient's family history includes Lung cancer in his father; Osteoporosis in his mother; Other in his brother.  ROS:  Please see the history of present illness.  ROS  All other ROS reviewed and negative.     Physical Exam/Data:   Vitals:   08/12/16 0550 08/12/16 0952 08/12/16 1211 08/12/16 1212  BP: 120/87 97/61 (!) 88/60   Pulse: 90 80  70  Resp: 16   16  Temp: 98.1 F (36.7 C)   97.8 F (36.6 C)  TempSrc: Oral   Oral  SpO2: 100% 97%  97%  Weight: 141 lb 1.5 oz (64 kg)     Height:        Intake/Output Summary (Last 24 hours) at 08/12/16 1339 Last data filed at 08/12/16 1317  Gross per 24 hour  Intake          1794.33 ml  Output             2308 ml  Net          -513.67 ml   Filed Weights   08/10/16 0700 08/11/16 0554 08/12/16 0550  Weight: 135 lb (61.2 kg) 144 lb 6.4 oz (65.5 kg) 141 lb 1.5 oz (64 kg)   Body mass index is 21.45 kg/m.  General:  Well nourished, well developed, in no acute distress HEENT: normal Lymph: no adenopathy Neck: no JVD Endocrine:  No thryomegaly Vascular: No carotid bruits; FA pulses 2+ bilaterally without bruits  Cardiac:  RRR; 1/6 SM, left chest/nearly hemithorax has large bulky dressing, is clean/dry Lungs:  CTA b/l, no wheezing, rhonchi or rales  Abd: large abdominal dressing in place, dry/clean, drain remains  Ext: no edema Musculoskeletal:  No deformities, BUE and BLE strength normal and equal Skin: warm and dry large area of ecchymosis to left hemithorax and LUE Neuro:  CNs 2-12 intact, no focal abnormalities noted Psych:  Normal affect   EKG:  The EKG was personally reviewed and demonstrates:  ST, 123bpm, LAD, QS II, III, aVF, V1-5 Telemetry:  Telemetry was personally reviewed and demonstrates:  SR, initially NSVT episodes ar enoted, none in the last 24 hours  Relevant CV Studies:  Transthoracic  Echocardiogram  08/06/2016 Study Conclusions - Left ventricle: The cavity size was normal. There was mild focal basal hypertrophy of the septum. Systolic function was moderately reduced. The estimated ejection fraction was in the range of 35% to 40%. There is akinesis of the apical myocardium. There is akinesis of the apicalanterior, lateral, inferolateral, and inferior myocardium. There is akinesis of the midanteroseptal myocardium. There was an increased relative contribution of atrial contraction to ventricular  filling. Doppler parameters are consistent with abnormal left ventricular relaxation (grade 1 diastolic dysfunction). There was an apparent, medium-sized, apicalthrombus. - Pulmonary arteries: Systolic pressure could not be accurately estimated. - Pericardium, extracardiac: There was a large left pleural effusion.  Laboratory Data:  Chemistry  Recent Labs Lab 08/10/16 0745 08/11/16 0528 08/12/16 0404  NA 139 138 136  K 3.0* 3.3* 3.9  CL 108 107 107  CO2 23 24 21*  GLUCOSE 106* 108* 136*  BUN 22* 14 11  CREATININE 0.85 0.80 0.76  CALCIUM 7.5* 7.4* 7.6*  GFRNONAA >60 >60 >60  GFRAA >60 >60 >60  ANIONGAP 8 7 8      Recent Labs Lab 08/06/16 0408 08/10/16 0745 08/11/16 0528  PROT 4.5* 4.2* 4.0*  ALBUMIN 2.6* 2.2* 2.0*  AST 56* 58* 54*  ALT 19 27 28   ALKPHOS 24* 31* 34*  BILITOT 1.3* 1.3* 1.4*   Hematology  Recent Labs Lab 08/10/16 0745 08/11/16 0528 08/12/16 0404  WBC 10.1 9.4 11.8*  RBC 2.28* 2.71* 2.87*  HGB 7.5* 8.5* 9.1*  HCT 21.1* 25.0* 26.6*  MCV 92.5 92.3 92.7  MCH 32.9 31.4 31.7  MCHC 35.5 34.0 34.2  RDW 16.7* 18.4* 17.9*  PLT 186 192 227    Radiology/Studies:  Ct Chest Wo Contrast Result Date: 08/08/2016 CLINICAL DATA:  Left upper chest wall edema. EXAM: CT CHEST WITHOUT CONTRAST TECHNIQUE: Multidetector CT imaging of the chest was performed following the standard protocol without IV contrast. COMPARISON:  CT  scan of August 04, 2016. FINDINGS: Cardiovascular: Atherosclerosis of thoracic aorta is noted without aneurysm formation. Coronary artery calcifications are noted. Minimal pericardial effusion is noted. Mediastinum/Nodes: No enlarged mediastinal or axillary lymph nodes. Thyroid gland, trachea, and esophagus demonstrate no significant findings. Lungs/Pleura: No pneumothorax is noted. Moderate bilateral pleural effusions are noted with adjacent subsegmental atelectasis of the lower lobes. Upper Abdomen: Surgical drain is seen superior to the stomach. Musculoskeletal: Mildly displaced anterior left second rib fracture is noted. Stable probable nondisplaced fracture involving T8 vertebral body. There is interval development of probable large hematoma measuring 18 x 14 x 8 mm along the anterior portion of the left chest wall. IMPRESSION: Coronary artery calcifications are noted suggesting coronary artery disease. Moderate bilateral pleural effusions are noted with adjacent subsegmental atelectasis of both lower lobes. Mildly displaced anterior left second rib fracture. Stable probable nondisplaced fracture involving T8 vertebral body. Interval development of large hematoma along the anterior portion of the left chest wall. Aortic Atherosclerosis (ICD10-I70.0). Electronically Signed   By: Marijo Conception, M.D.   On: 08/08/2016 17:44   Dg Humerus Left Result Date: 08/11/2016 CLINICAL DATA:  Two days of pain and swelling over the left numb status post fall. EXAM: LEFT HUMERUS - 2+ VIEW COMPARISON:  None in PACs FINDINGS: The humerus is subjectively adequately mineralized. There is no acute fracture. The observed portions of the shoulder and elbow appear normal. The soft tissues of the arm exhibit no acute abnormalities. IMPRESSION: There is no acute or significant chronic bony abnormality of the left humerus. Electronically Signed   By: David  Martinique M.D.   On: 08/11/2016 12:33    Assessment and Plan:   1. Cardiac  ArrestVT/VF:  --Suspected primary event was VT/VF arrest in setting of known ischemic CM (MI in late 80-90. Cared for by Dr. Doreatha Lew. No f/u since. Reports cath at that time with what sounds like POBA) - not felt to have ACS by cardiology with Trop flat at 1.0. ECG ok -ECHO EF  35-40%.  - agree, Keep K. 4.0 Mg > 2.0  2. CAD with ischemic CM - No CP.  - ASA on hold due to hematoma - On BB, statin, entresto, spironolactone  Looks like discussion for cath later this week  3. Perforated stomach     s/p repair. Suspect due to CPR.   4. Frequent PVCs, NSVT      This seems to be quieting down in the last 24 hours   5. LV Thrombus:  Has been off since 7/28 due to fall and chest wall hematoma.    6. Chest wall hematoma with symptomatic anemia - Received 1PRBC on 7/29.  H/H stable last 2 days  7. Acute delirium resolved  9. ETOH and tobacco abuse -being addressed by primary team    Will await cath findings, difficult situation given chest wall hematoma and LV thrombus, if he has CAD w/need for intervention. 08/09/16 CTS no surgical intervention recommended, May need to aspirate when clot lysis occursstop heparin, OK ASA after 3 days    ICD decision/timing pending cath, chest wall hematoma is left anterior chest   Dr. Rayann Heman to see later today   Signed, Baldwin Jamaica, PA-C  08/12/2016 1:39 PM   I have seen, examined the patient, and reviewed the above assessment and plan.  Changes to above are made where necessary.  On exam, suprisingly well appearing.  Marked L arm swelling and chest hematoma.   Awaiting cath tomorrow.  Given multiple acute issues, I agree with Dr Haroldine Laws that [place] may be the best short term approach.  We will need to work with [employer] representative to make sure that the patient is a candidate for vest given his marked chest wall hematoma. I would like to wait another 24-48 hours to see how his hematoma will stabilize.  Hopefully his  dressing can be removed at that time.  EP to follow up post cath.  Co Sign: Thompson Grayer, MD 08/12/2016 10:46 PM

## 2016-08-12 NOTE — Progress Notes (Signed)
8 Days Post-Op   Subjective/Chief Complaint: tol soft Wants to shower   Objective: Vital signs in last 24 hours: Temp:  [98.1 F (36.7 C)-98.3 F (36.8 C)] 98.1 F (36.7 C) (07/31 0550) Pulse Rate:  [84-90] 90 (07/31 0550) Resp:  [16-19] 16 (07/31 0550) BP: (102-126)/(69-87) 120/87 (07/31 0550) SpO2:  [96 %-100 %] 100 % (07/31 0550) Weight:  [64 kg (141 lb 1.5 oz)] 64 kg (141 lb 1.5 oz) (07/31 0550) Last BM Date: 08/11/16  Intake/Output from previous day: 07/30 0701 - 07/31 0700 In: 1674.3 [P.O.:240; I.V.:784.3; IV Piggyback:600] Out: 1868 [Urine:1850; Drains:18] Intake/Output this shift: No intake/output data recorded.  General appearance: alert and cooperative GI: soft, non-tender; bowel sounds normal; no masses,  no organomegaly and incision c/d/i  Lab Results:   Recent Labs  08/11/16 0528 08/12/16 0404  WBC 9.4 11.8*  HGB 8.5* 9.1*  HCT 25.0* 26.6*  PLT 192 227   BMET  Recent Labs  08/11/16 0528 08/12/16 0404  NA 138 136  K 3.3* 3.9  CL 107 107  CO2 24 21*  GLUCOSE 108* 136*  BUN 14 11  CREATININE 0.80 0.76  CALCIUM 7.4* 7.6*   PT/INR  Recent Labs  08/09/16 1114  LABPROT 17.2*  INR 1.39   ABG No results for input(s): PHART, HCO3 in the last 72 hours.  Invalid input(s): PCO2, PO2  Studies/Results: Dg Humerus Left  Result Date: 08/11/2016 CLINICAL DATA:  Two days of pain and swelling over the left numb status post fall. EXAM: LEFT HUMERUS - 2+ VIEW COMPARISON:  None in PACs FINDINGS: The humerus is subjectively adequately mineralized. There is no acute fracture. The observed portions of the shoulder and elbow appear normal. The soft tissues of the arm exhibit no acute abnormalities. IMPRESSION: There is no acute or significant chronic bony abnormality of the left humerus. Electronically Signed   By: David  Martinique M.D.   On: 08/11/2016 12:33    Anti-infectives: Anti-infectives    Start     Dose/Rate Route Frequency Ordered Stop   08/06/16  1200  fluconazole (DIFLUCAN) IVPB 400 mg     400 mg 100 mL/hr over 120 Minutes Intravenous Every 24 hours 08/06/16 0959 08/10/16 1400   08/06/16 1100  Ampicillin-Sulbactam (UNASYN) 3 g in sodium chloride 0.9 % 100 mL IVPB     3 g 200 mL/hr over 30 Minutes Intravenous Every 6 hours 08/06/16 0959     08/05/16 1730  anidulafungin (ERAXIS) 100 mg in sodium chloride 0.9 % 100 mL IVPB  Status:  Discontinued     100 mg 78 mL/hr over 100 Minutes Intravenous Every 24 hours 08/04/16 1657 08/06/16 0959   08/05/16 0000  piperacillin-tazobactam (ZOSYN) IVPB 3.375 g  Status:  Discontinued     3.375 g 12.5 mL/hr over 240 Minutes Intravenous Every 8 hours 08/04/16 1717 08/06/16 0959   08/04/16 1800  piperacillin-tazobactam (ZOSYN) IVPB 3.375 g  Status:  Discontinued     3.375 g 100 mL/hr over 30 Minutes Intravenous To Surgery 08/04/16 1754 08/04/16 1848   08/04/16 1730  anidulafungin (ERAXIS) 200 mg in sodium chloride 0.9 % 200 mL IVPB     200 mg 78 mL/hr over 200 Minutes Intravenous  Once 08/04/16 1657 08/05/16 0222   08/04/16 1700  piperacillin-tazobactam (ZOSYN) IVPB 3.375 g  Status:  Discontinued     3.375 g 100 mL/hr over 30 Minutes Intravenous  Once 08/04/16 1655 08/06/16 0959      Assessment/Plan: s/p Procedure(s): EXPLORATORY LAPAROTOMY FREE AIR BIOPSY  OF PERFORATED STOMACH ULCER REPAIR OF PERFORATED STOMACH ULCER WITH A OMENTAL PATCH (N/A) Advance diet to heart healthy OK to shower  Mobilize as tol   LOS: 8 days    Rosario Jacks., Anne Hahn 08/12/2016

## 2016-08-12 NOTE — Consult Note (Signed)
[facility] CM Inpatient Consult   08/12/2016  [place] [birth year] [id number]   Follow up:  Met with the patient and wife at the bedside as their plan for disposition may be home with home health care.  Patient states he goes to [facility] for blood pressure checks and vaccines but he doesn't really have a primary care provider.  He states the therapist told him he will need some therapy and equipment at home.  Explained the differences between care management and home health.  Patient's wife, Olin Hauser, states she will provide 24 hour assistance and will follow up if he needs care management.  A brochure with 24 hour nurse advise line magnet given.  He uses [facility] in [place] for his medications without issues.  No other needs voiced.  For questions, please contact:  Natividad Brood, RN BSN [facility] Liaison  [phone] business mobile phone Toll free office [phone]

## 2016-08-13 ENCOUNTER — Encounter (HOSPITAL_COMMUNITY): Payer: Self-pay | Admitting: Internal Medicine

## 2016-08-13 ENCOUNTER — Encounter (HOSPITAL_COMMUNITY)
Admission: EM | Disposition: A | Discharge status: Home Health | Payer: Self-pay | Source: Home / Self Care | Attending: Family Medicine

## 2016-08-13 DIAGNOSIS — S299XXA Unspecified injury of thorax, initial encounter: Secondary | ICD-10-CM

## 2016-08-13 DIAGNOSIS — I2510 Atherosclerotic heart disease of native coronary artery without angina pectoris: Secondary | ICD-10-CM

## 2016-08-13 DIAGNOSIS — I5022 Chronic systolic (congestive) heart failure: Secondary | ICD-10-CM

## 2016-08-13 HISTORY — PX: RIGHT/LEFT HEART CATH AND CORONARY ANGIOGRAPHY: CATH118266

## 2016-08-13 LAB — CBC
HEMATOCRIT: 26 % — AB (ref 39.0–52.0)
HEMOGLOBIN: 8.9 g/dL — AB (ref 13.0–17.0)
MCH: 32 pg (ref 26.0–34.0)
MCHC: 34.2 g/dL (ref 30.0–36.0)
MCV: 93.5 fL (ref 78.0–100.0)
Platelets: 245 10*3/uL (ref 150–400)
RBC: 2.78 MIL/uL — ABNORMAL LOW (ref 4.22–5.81)
RDW: 17.9 % — AB (ref 11.5–15.5)
WBC: 12.4 10*3/uL — ABNORMAL HIGH (ref 4.0–10.5)

## 2016-08-13 LAB — COMPREHENSIVE METABOLIC PANEL
ALBUMIN: 2 g/dL — AB (ref 3.5–5.0)
ALK PHOS: 40 U/L (ref 38–126)
ALT: 33 U/L (ref 17–63)
ANION GAP: 4 — AB (ref 5–15)
AST: 44 U/L — ABNORMAL HIGH (ref 15–41)
BILIRUBIN TOTAL: 1.6 mg/dL — AB (ref 0.3–1.2)
BUN: 9 mg/dL (ref 6–20)
CALCIUM: 7.5 mg/dL — AB (ref 8.9–10.3)
CO2: 23 mmol/L (ref 22–32)
Chloride: 105 mmol/L (ref 101–111)
Creatinine, Ser: 0.75 mg/dL (ref 0.61–1.24)
GFR calc Af Amer: >60 mL/min (ref >60)
GFR calc non Af Amer: >60 mL/min (ref >60)
GLUCOSE: 123 mg/dL — AB (ref 65–99)
POTASSIUM: 4.1 mmol/L (ref 3.5–5.1)
Sodium: 132 mmol/L — ABNORMAL LOW (ref 135–145)
TOTAL PROTEIN: 4.2 g/dL — AB (ref 6.5–8.1)

## 2016-08-13 LAB — POCT I-STAT 3, VENOUS BLOOD GAS (G3P V)
ACID-BASE DEFICIT: 1 mmol/L (ref 0.0–2.0)
Acid-base deficit: 1 mmol/L (ref 0.0–2.0)
BICARBONATE: 22.6 mmol/L (ref 20.0–28.0)
Bicarbonate: 22.1 mmol/L (ref 20.0–28.0)
O2 Saturation: 54 %
O2 Saturation: 55 %
PCO2 VEN: 30.7 mmHg — AB (ref 44.0–60.0)
PCO2 VEN: 30.8 mmHg — AB (ref 44.0–60.0)
PH VEN: 7.464 — AB (ref 7.250–7.430)
PH VEN: 7.474 — AB (ref 7.250–7.430)
PO2 VEN: 26 mmHg — AB (ref 32.0–45.0)
PO2 VEN: 27 mmHg — AB (ref 32.0–45.0)
TCO2: 23 mmol/L (ref 0–100)
TCO2: 24 mmol/L (ref 0–100)

## 2016-08-13 LAB — TYPE AND SCREEN
ABO/RH(D): A POS
ANTIBODY SCREEN: NEGATIVE
Unit division: 0
Unit division: 0
Unit division: 0
Unit division: 0

## 2016-08-13 LAB — POCT I-STAT 3, ART BLOOD GAS (G3+)
Acid-base deficit: 3 mmol/L — ABNORMAL HIGH (ref 0.0–2.0)
Bicarbonate: 19.9 mmol/L — ABNORMAL LOW (ref 20.0–28.0)
O2 SAT: 92 %
PCO2 ART: 25.6 mmHg — AB (ref 32.0–48.0)
PH ART: 7.499 — AB (ref 7.350–7.450)
PO2 ART: 57 mmHg — AB (ref 83.0–108.0)
TCO2: 21 mmol/L (ref 0–100)

## 2016-08-13 LAB — BPAM RBC
BLOOD PRODUCT EXPIRATION DATE: 201808092359
BLOOD PRODUCT EXPIRATION DATE: 201808142359
BLOOD PRODUCT EXPIRATION DATE: 201808142359
Blood Product Expiration Date: 201808072359
ISSUE DATE / TIME: 201807281305
ISSUE DATE / TIME: 201807281605
ISSUE DATE / TIME: 201807291305
UNIT TYPE AND RH: 6200
UNIT TYPE AND RH: 6200
UNIT TYPE AND RH: 6200
UNIT TYPE AND RH: 6200

## 2016-08-13 LAB — MAGNESIUM: MAGNESIUM: 2.1 mg/dL (ref 1.7–2.4)

## 2016-08-13 SURGERY — RIGHT/LEFT HEART CATH AND CORONARY ANGIOGRAPHY
Anesthesia: LOCAL

## 2016-08-13 MED ORDER — SODIUM CHLORIDE 0.9 % IV SOLN
INTRAVENOUS | Status: AC
  Administered 2016-08-13: 12:00:00 via INTRAVENOUS

## 2016-08-13 MED ORDER — LIDOCAINE HCL (PF) 1 % IJ SOLN
INTRAMUSCULAR | Status: AC
  Filled 2016-08-13: qty 30

## 2016-08-13 MED ORDER — SODIUM CHLORIDE 0.9 % IV SOLN
250.0000 mL | INTRAVENOUS | Status: DC | PRN
Start: 2016-08-13 — End: 2016-08-15

## 2016-08-13 MED ORDER — LIDOCAINE HCL (PF) 1 % IJ SOLN
INTRAMUSCULAR | Status: DC | PRN
  Administered 2016-08-13: 3 mL

## 2016-08-13 MED ORDER — NITROGLYCERIN 1 MG/10 ML FOR IR/CATH LAB
INTRA_ARTERIAL | Status: AC
  Filled 2016-08-13: qty 10

## 2016-08-13 MED ORDER — ONDANSETRON HCL 4 MG/2ML IJ SOLN: 4.0000 mg | Freq: Four times a day (QID) | INTRAMUSCULAR | Status: DC | PRN

## 2016-08-13 MED ORDER — HEPARIN SODIUM (PORCINE) 1000 UNIT/ML IJ SOLN
INTRAMUSCULAR | Status: DC | PRN
  Administered 2016-08-13: 3500 [IU] via INTRAVENOUS

## 2016-08-13 MED ORDER — IOPAMIDOL (ISOVUE-370) INJECTION 76%
INTRAVENOUS | Status: DC | PRN
  Administered 2016-08-13: 100 mL via INTRA_ARTERIAL

## 2016-08-13 MED ORDER — SODIUM CHLORIDE 0.9% FLUSH
3.0000 mL | Freq: Two times a day (BID) | INTRAVENOUS | Status: DC
  Administered 2016-08-13 – 2016-08-14 (×3): 3 mL via INTRAVENOUS

## 2016-08-13 MED ORDER — VERAPAMIL HCL 2.5 MG/ML IV SOLN
INTRAVENOUS | Status: DC | PRN
  Administered 2016-08-13: 10 mL via INTRA_ARTERIAL

## 2016-08-13 MED ORDER — ACETAMINOPHEN 325 MG PO TABS: 650.0000 mg | ORAL_TABLET | ORAL | Status: DC | PRN

## 2016-08-13 MED ORDER — NITROGLYCERIN 1 MG/10 ML FOR IR/CATH LAB
INTRA_ARTERIAL | Status: DC | PRN
  Administered 2016-08-13: 200 ug via INTRACORONARY

## 2016-08-13 MED ORDER — HEPARIN (PORCINE) IN NACL 2-0.9 UNIT/ML-% IJ SOLN
INTRAMUSCULAR | Status: AC | PRN
  Administered 2016-08-13: 1000 mL

## 2016-08-13 MED ORDER — HEPARIN SODIUM (PORCINE) 1000 UNIT/ML IJ SOLN
INTRAMUSCULAR | Status: AC
  Filled 2016-08-13: qty 1

## 2016-08-13 MED ORDER — IOPAMIDOL (ISOVUE-370) INJECTION 76%
INTRAVENOUS | Status: AC
  Filled 2016-08-13: qty 100

## 2016-08-13 MED ORDER — HEPARIN (PORCINE) IN NACL 2-0.9 UNIT/ML-% IJ SOLN
INTRAMUSCULAR | Status: AC
  Filled 2016-08-13: qty 1000

## 2016-08-13 MED ORDER — SODIUM CHLORIDE 0.9% FLUSH
3.0000 mL | INTRAVENOUS | Status: DC | PRN
Start: 2016-08-13 — End: 2016-08-15

## 2016-08-13 SURGICAL SUPPLY — 13 items
CATH BALLN WEDGE 5F 110CM (CATHETERS) ×1 IMPLANT
CATH EXPO 5F FL3.5 (CATHETERS) ×1 IMPLANT
CATH INFINITI JR4 5F (CATHETERS) ×1 IMPLANT
CATH INFINITI MULTIPACK ANG 4F (CATHETERS) ×1 IMPLANT
DEVICE RAD COMP TR BAND LRG (VASCULAR PRODUCTS) ×1 IMPLANT
GLIDESHEATH SLEND SS 6F .021 (SHEATH) ×1 IMPLANT
GUIDEWIRE INQWIRE 1.5J.035X260 (WIRE) IMPLANT
INQWIRE 1.5J .035X260CM (WIRE) ×2
KIT HEART LEFT (KITS) ×2 IMPLANT
PACK CARDIAC CATHETERIZATION (CUSTOM PROCEDURE TRAY) ×2 IMPLANT
SHEATH GLIDE SLENDER 4/5FR (SHEATH) ×1 IMPLANT
TRANSDUCER W/STOPCOCK (MISCELLANEOUS) ×2 IMPLANT
TUBING CIL FLEX 10 FLL-RA (TUBING) ×2 IMPLANT

## 2016-08-13 NOTE — Progress Notes (Signed)
PT Cancellation Note  Patient Details Name: [name] [name] MRN: [id number] DOB: February 24, 1950   Cancelled Treatment:    Reason Eval/Treat Not Completed: Patient at procedure or test/unavailable (Cath lab)  Will follow up tomorrow.   Shella Maxim, SPT Acute Rehabilitation Services Office: [phone]   Roderic Palau 08/13/2016, 9:46 AM

## 2016-08-13 NOTE — Progress Notes (Signed)
Progress Note  Patient Name: Ari Bernabei Date of Encounter: 08/13/2016  Primary Cardiologist: New  Subjective   Denies SOB. Chest still sore particularly with deep breathing. Tolerating diet.    Inpatient Medications    Scheduled Meds: . [MAR Hold] carvedilol  6.25 mg Oral BID WC  . [MAR Hold] Chlorhexidine Gluconate Cloth  6 each Topical Daily  . [MAR Hold] docusate sodium  100 mg Oral BID  . [MAR Hold] folic acid  1 mg Oral Daily  . [MAR Hold] furosemide  40 mg Oral Daily  . [MAR Hold] multivitamin with minerals  1 tablet Oral Daily  . [MAR Hold] pantoprazole  40 mg Oral BID  . [MAR Hold] pneumococcal 23 valent vaccine  0.5 mL Intramuscular Tomorrow-1000  . [MAR Hold] potassium chloride  40 mEq Oral BID  . [MAR Hold] rosuvastatin  10 mg Oral q1800  . [MAR Hold] sacubitril-valsartan  1 tablet Oral BID  . [MAR Hold] sodium chloride flush  10-40 mL Intracatheter Q12H  . sodium chloride flush  3 mL Intravenous Q12H  . [MAR Hold] spironolactone  25 mg Oral Daily  . [MAR Hold] thiamine  100 mg Oral Daily   Continuous Infusions: . sodium chloride 10 mL/hr at 08/07/16 0600  . [MAR Hold] sodium chloride    . [MAR Hold] sodium chloride    . [MAR Hold] sodium chloride    . [MAR Hold] sodium chloride    . sodium chloride    . sodium chloride 10 mL/hr at 08/12/16 2217  . [MAR Hold] ampicillin-sulbactam (UNASYN) IV Stopped (08/13/16 0539)   PRN Meds: [MAR Hold] sodium chloride, sodium chloride, [MAR Hold] fentaNYL (SUBLIMAZE) injection, [MAR Hold] sodium chloride flush, sodium chloride flush   Vital Signs    Vitals:   08/12/16 1212 08/12/16 1818 08/12/16 2041 08/13/16 0423  BP:  (!) 110/52 (!) 124/56 (!) 121/52  Pulse: 70 100 83 83  Resp: 16  16 18   Temp: 97.8 F (36.6 C)  98.8 F (37.1 C) 98.5 F (36.9 C)  TempSrc: Oral  Oral Oral  SpO2: 97%  97% 94%  Weight:    68.1 kg (150 lb 3.2 oz)  Height:        Intake/Output Summary (Last 24 hours) at 08/13/16  0937 Last data filed at 08/13/16 0817  Gross per 24 hour  Intake           647.83 ml  Output             2675 ml  Net         -2027.17 ml   Filed Weights   08/11/16 0554 08/12/16 0550 08/13/16 0423  Weight: 65.5 kg (144 lb 6.4 oz) 64 kg (141 lb 1.5 oz) 68.1 kg (150 lb 3.2 oz)    Telemetry   Personally reviewed. NSR 80s occasional PVCs.     Physical Exam   General: sitting in chair NAD  HEENT: normal Neck: supple. JVP 6-7. Carotids 2+ bilat; no bruits. No lymphadenopathy or thryomegaly appreciated. Cor: PMI nondisplaced. RRR no obvious murmur . Large chest wall hematoma. Skin intact. Soft.  Lungs: clear Abdomen:  Soft NT/NT. Dressing intact. Good BS. Incisions healing Extremities: no cyanosis, clubbing, rash, edema  LUE with large hematoma Neuro: alert & oriented x 3, cranial nerves grossly intact. moves all 4 extremities w/o difficulty. Affect pleasant Skin: L flank /LUE ecchymotic    Labs    Chemistry  Recent Labs Lab 08/10/16 0745 08/11/16 0938 08/12/16 0404 08/13/16 1829  NA 139 138 136 132*  K 3.0* 3.3* 3.9 4.1  CL 108 107 107 105  CO2 23 24 21* 23  GLUCOSE 106* 108* 136* 123*  BUN 22* 14 11 9   CREATININE 0.85 0.80 0.76 0.75  CALCIUM 7.5* 7.4* 7.6* 7.5*  PROT 4.2* 4.0*  --  4.2*  ALBUMIN 2.2* 2.0*  --  2.0*  AST 58* 54*  --  44*  ALT 27 28  --  33  ALKPHOS 31* 34*  --  40  BILITOT 1.3* 1.4*  --  1.6*  GFRNONAA >60 >60 >60 >60  GFRAA >60 >60 >60 >60  ANIONGAP 8 7 8  4*     Hematology  Recent Labs Lab 08/11/16 0528 08/12/16 0404 08/13/16 0259  WBC 9.4 11.8* 12.4*  RBC 2.71* 2.87* 2.78*  HGB 8.5* 9.1* 8.9*  HCT 25.0* 26.6* 26.0*  MCV 92.3 92.7 93.5  MCH 31.4 31.7 32.0  MCHC 34.0 34.2 34.2  RDW 18.4* 17.9* 17.9*  PLT 192 227 245    Cardiac Enzymes No results for input(s): TROPONINI in the last 168 hours.  No results for input(s): TROPIPOC in the last 168 hours.    Radiology    Dg Humerus Left  Result Date: 08/11/2016 CLINICAL DATA:   Two days of pain and swelling over the left numb status post fall. EXAM: LEFT HUMERUS - 2+ VIEW COMPARISON:  None in PACs FINDINGS: The humerus is subjectively adequately mineralized. There is no acute fracture. The observed portions of the shoulder and elbow appear normal. The soft tissues of the arm exhibit no acute abnormalities. IMPRESSION: There is no acute or significant chronic bony abnormality of the left humerus. Electronically Signed   By: David  Martinique M.D.   On: 08/11/2016 12:33    Cardiac Studies   Transthoracic Echocardiogram  08/06/2016  Study Conclusions  - Left ventricle: The cavity size was normal. There was mild focal   basal hypertrophy of the septum. Systolic function was moderately   reduced. The estimated ejection fraction was in the range of 35%   to 40%. There is akinesis of the apical myocardium. There is   akinesis of the apicalanterior, lateral, inferolateral, and   inferior myocardium. There is akinesis of the midanteroseptal   myocardium. There was an increased relative contribution of   atrial contraction to ventricular filling. Doppler parameters are   consistent with abnormal left ventricular relaxation (grade 1   diastolic dysfunction). There was an apparent, medium-sized,   apicalthrombus. - Pulmonary arteries: Systolic pressure could not be accurately   estimated. - Pericardium, extracardiac: There was a large left pleural   effusion.   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 CT chest also showed atherosclerosis and mildly displaced anterior left second rib fracture as well as stable probable nondisplaced fracture involving T8 vertebral body. Echo with EF 35-40% with LV thrombus  Developed large chet wall hematoma on 7/27 in setting of AC and fall.   Assessment & Plan     1. Cardiac ArrestVT/VF:  --Suspect  primary event was VT/VF arrest in setting of known ischemic CM (MI in late 80-90. Cared for by Dr. Doreatha Lew. No f/u since. Reports cath at that time with what sounds like POBA) - suspect perforated stomach and rib fx sequale from CPR - no evidence ACS. Trop flat at 1.0. ECG ok -ECHO EF 35-40%. - EP  has seen -> agree with possible LifeVest. Await results of cath today - Keep K .4.0 Mg > 2.0  2. CAD with ischemic CM - No CP.  - ASA on hold due to hematoma - Volume status stable.  - Continue b-blocker and statin. Restart ASA - On low dose entresto and carvedilol - Continue spiro to 25 mg daily.  Renal function stable.   - Cath today 3. Perforated stomach - s/p repair. Suspect due to CPR. Cannot exclude PUD though recent EGD ok.  - GSU following. Advancing diet.   4. Frequent PVCs - May be contributing to systolic dysfunction. NSVT /PVCs.  - Improved with b-blocker. Occasional PVCs now. EP seeing for ? ICD vs LifeVest - Keep potassium > 4 and Magnesium > 2  5. LV Thrombus:  Lamonte Sakai been off since 7/28 due to fall and chest wall hematoma.   SCDs for DVT prophylaxis  6. Chest wall hematoma with symptomatic anemia - Received 1PRBC on 7/29. Todays hgb 9.1 -> 8.9  7. Hypokalemia/hypomagnesemia:  -K 4.1 Mag 2.1   8. Acute delirium resolved  9. ETOH and tobacco abuse - has mild cirrhosis on previous CT.  - has cut back ETOH but hasn't quit - Counseled on need for cessation -  Watch for withdrawal. On folate and thiamine  10. Suspected Fall 08/08/16-  - left arm swollen with mildly decreased ROM> Xray L humerus --no fracture.  -Elevate LUE.    Glori Bickers, MD  08/13/2016, 9:37 AM

## 2016-08-13 NOTE — H&P (View-Only) (Signed)
Progress Note  Patient Name: [name] [name] Date of Encounter: 08/13/2016  Primary Cardiologist: New  Subjective   Denies SOB. Chest still sore particularly with deep breathing. Tolerating diet.    Inpatient Medications    Scheduled Meds: . [MAR Hold] carvedilol  6.25 mg Oral BID WC  . [MAR Hold] Chlorhexidine Gluconate Cloth  6 each Topical Daily  . [MAR Hold] docusate sodium  100 mg Oral BID  . [MAR Hold] folic acid  1 mg Oral Daily  . [MAR Hold] furosemide  40 mg Oral Daily  . [MAR Hold] multivitamin with minerals  1 tablet Oral Daily  . [MAR Hold] pantoprazole  40 mg Oral BID  . [MAR Hold] pneumococcal 23 valent vaccine  0.5 mL Intramuscular Tomorrow-1000  . [MAR Hold] potassium chloride  40 mEq Oral BID  . [MAR Hold] rosuvastatin  10 mg Oral q1800  . [MAR Hold] sacubitril-valsartan  1 tablet Oral BID  . [MAR Hold] sodium chloride flush  10-40 mL Intracatheter Q12H  . sodium chloride flush  3 mL Intravenous Q12H  . [MAR Hold] spironolactone  25 mg Oral Daily  . [MAR Hold] thiamine  100 mg Oral Daily   Continuous Infusions: . sodium chloride 10 mL/hr at 08/07/16 0600  . [MAR Hold] sodium chloride    . [MAR Hold] sodium chloride    . [MAR Hold] sodium chloride    . [MAR Hold] sodium chloride    . sodium chloride    . sodium chloride 10 mL/hr at 08/12/16 2217  . [MAR Hold] ampicillin-sulbactam (UNASYN) IV Stopped (08/13/16 0539)   PRN Meds: [MAR Hold] sodium chloride, sodium chloride, [MAR Hold] fentaNYL (SUBLIMAZE) injection, [MAR Hold] sodium chloride flush, sodium chloride flush   Vital Signs    Vitals:   08/12/16 1212 08/12/16 1818 08/12/16 2041 08/13/16 0423  BP:  (!) 110/52 (!) 124/56 (!) 121/52  Pulse: 70 100 83 83  Resp: 16  16 18   Temp: 97.8 F (36.6 C)  98.8 F (37.1 C) 98.5 F (36.9 C)  TempSrc: Oral  Oral Oral  SpO2: 97%  97% 94%  Weight:    68.1 kg (150 lb 3.2 oz)  Height:        Intake/Output Summary (Last 24 hours) at 08/13/16  0937 Last data filed at 08/13/16 0817  Gross per 24 hour  Intake           647.83 ml  Output             2675 ml  Net         -2027.17 ml   Filed Weights   08/11/16 0554 08/12/16 0550 08/13/16 0423  Weight: 65.5 kg (144 lb 6.4 oz) 64 kg (141 lb 1.5 oz) 68.1 kg (150 lb 3.2 oz)    Telemetry   Personally reviewed. NSR 80s occasional PVCs.     Physical Exam   General: sitting in chair NAD  HEENT: normal Neck: supple. JVP 6-7. Carotids 2+ bilat; no bruits. No lymphadenopathy or thryomegaly appreciated. Cor: PMI nondisplaced. RRR no obvious murmur . Large chest wall hematoma. Skin intact. Soft.  Lungs: clear Abdomen:  Soft NT/NT. Dressing intact. Good BS. Incisions healing Extremities: no cyanosis, clubbing, rash, edema  LUE with large hematoma Neuro: alert & oriented x 3, cranial nerves grossly intact. moves all 4 extremities w/o difficulty. Affect pleasant Skin: L flank /LUE ecchymotic    Labs    Chemistry  Recent Labs Lab 08/10/16 0745 08/11/16 8250 08/12/16 0404 08/13/16 5397  NA 139 138 136 132*  K 3.0* 3.3* 3.9 4.1  CL 108 107 107 105  CO2 23 24 21* 23  GLUCOSE 106* 108* 136* 123*  BUN 22* 14 11 9   CREATININE 0.85 0.80 0.76 0.75  CALCIUM 7.5* 7.4* 7.6* 7.5*  PROT 4.2* 4.0*  --  4.2*  ALBUMIN 2.2* 2.0*  --  2.0*  AST 58* 54*  --  44*  ALT 27 28  --  33  ALKPHOS 31* 34*  --  40  BILITOT 1.3* 1.4*  --  1.6*  GFRNONAA >60 >60 >60 >60  GFRAA >60 >60 >60 >60  ANIONGAP 8 7 8  4*     Hematology  Recent Labs Lab 08/11/16 0528 08/12/16 0404 08/13/16 0259  WBC 9.4 11.8* 12.4*  RBC 2.71* 2.87* 2.78*  HGB 8.5* 9.1* 8.9*  HCT 25.0* 26.6* 26.0*  MCV 92.3 92.7 93.5  MCH 31.4 31.7 32.0  MCHC 34.0 34.2 34.2  RDW 18.4* 17.9* 17.9*  PLT 192 227 245    Cardiac Enzymes No results for input(s): TROPONINI in the last 168 hours.  No results for input(s): TROPIPOC in the last 168 hours.    Radiology    Dg Humerus Left  Result Date: 08/11/2016 CLINICAL DATA:   Two days of pain and swelling over the left numb status post fall. EXAM: LEFT HUMERUS - 2+ VIEW COMPARISON:  None in PACs FINDINGS: The humerus is subjectively adequately mineralized. There is no acute fracture. The observed portions of the shoulder and elbow appear normal. The soft tissues of the arm exhibit no acute abnormalities. IMPRESSION: There is no acute or significant chronic bony abnormality of the left humerus. Electronically Signed   By: David  Martinique M.D.   On: 08/11/2016 12:33    Cardiac Studies   Transthoracic Echocardiogram  08/06/2016  Study Conclusions  - Left ventricle: The cavity size was normal. There was mild focal   basal hypertrophy of the septum. Systolic function was moderately   reduced. The estimated ejection fraction was in the range of 35%   to 40%. There is akinesis of the apical myocardium. There is   akinesis of the apicalanterior, lateral, inferolateral, and   inferior myocardium. There is akinesis of the midanteroseptal   myocardium. There was an increased relative contribution of   atrial contraction to ventricular filling. Doppler parameters are   consistent with abnormal left ventricular relaxation (grade 1   diastolic dysfunction). There was an apparent, medium-sized,   apicalthrombus. - Pulmonary arteries: Systolic pressure could not be accurately   estimated. - Pericardium, extracardiac: There was a large left pleural   effusion.   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 CT chest also showed atherosclerosis and mildly displaced anterior left second rib fracture as well as stable probable nondisplaced fracture involving T8 vertebral body. Echo with EF 35-40% with LV thrombus  Developed large chet wall hematoma on 7/27 in setting of AC and fall.   Assessment & Plan     1. Cardiac ArrestVT/VF:  --Suspect  primary event was VT/VF arrest in setting of known ischemic CM (MI in late 80-90. Cared for by Dr. Doreatha Lew. No f/u since. Reports cath at that time with what sounds like POBA) - suspect perforated stomach and rib fx sequale from CPR - no evidence ACS. Trop flat at 1.0. ECG ok -ECHO EF 35-40%. - EP  has seen -> agree with possible LifeVest. Await results of cath today - Keep K .4.0 Mg > 2.0  2. CAD with ischemic CM - No CP.  - ASA on hold due to hematoma - Volume status stable.  - Continue b-blocker and statin. Restart ASA - On low dose entresto and carvedilol - Continue spiro to 25 mg daily.  Renal function stable.   - Cath today 3. Perforated stomach - s/p repair. Suspect due to CPR. Cannot exclude PUD though recent EGD ok.  - GSU following. Advancing diet.   4. Frequent PVCs - May be contributing to systolic dysfunction. NSVT /PVCs.  - Improved with b-blocker. Occasional PVCs now. EP seeing for ? ICD vs LifeVest - Keep potassium > 4 and Magnesium > 2  5. LV Thrombus:  Lamonte Sakai been off since 7/28 due to fall and chest wall hematoma.   SCDs for DVT prophylaxis  6. Chest wall hematoma with symptomatic anemia - Received 1PRBC on 7/29. Todays hgb 9.1 -> 8.9  7. Hypokalemia/hypomagnesemia:  -K 4.1 Mag 2.1   8. Acute delirium resolved  9. ETOH and tobacco abuse - has mild cirrhosis on previous CT.  - has cut back ETOH but hasn't quit - Counseled on need for cessation -  Watch for withdrawal. On folate and thiamine  10. Suspected Fall 08/08/16-  - left arm swollen with mildly decreased ROM> Xray L humerus --no fracture.  -Elevate LUE.    Glori Bickers, MD  08/13/2016, 9:37 AM

## 2016-08-13 NOTE — Progress Notes (Signed)
Pt stated his dressing was changed this morning and he does not want it changed right now.  Pt states he wants to take another nap.  Will continue to monitor.

## 2016-08-13 NOTE — Progress Notes (Signed)
Patient refused to have RN change dressing. Patient alert and oriented. Educated and patient verbalized understanding. Will continue to monitor.

## 2016-08-13 NOTE — Discharge Instructions (Addendum)
[facility]      [facility] °[phone] ° °OPEN ABDOMINAL SURGERY: POST OP INSTRUCTIONS ° °Always review your discharge instruction sheet given to you by the facility where your surgery was performed. ° °IF YOU HAVE DISABILITY OR FAMILY LEAVE FORMS, YOU MUST BRING THEM TO THE OFFICE FOR PROCESSING.  PLEASE DO NOT GIVE THEM TO YOUR DOCTOR. ° °1. A prescription for pain medication may be given to you upon discharge.  Take your pain medication as prescribed, if needed.  If narcotic pain medicine is not needed, then you may take acetaminophen (Tylenol) or ibuprofen (Advil) as needed. °2. Take your usually prescribed medications unless otherwise directed. °3. If you need a refill on your pain medication, please contact your pharmacy. They will contact our office to request authorization.  Prescriptions will not be filled after 5pm or on week-ends. °4. You should follow a light diet the first few days after arrival home, such as soup and crackers, pudding, etc.unless your doctor has advised otherwise. A high-fiber, low fat diet can be resumed as tolerated.   Be sure to include lots of fluids daily. Most patients will experience some swelling and bruising on the chest and neck area.  Ice packs will help.  Swelling and bruising can take several days to resolve °5. Most patients will experience some swelling and bruising in the area of the incision. Ice pack will help. Swelling and bruising can take several days to resolve..  °6. It is common to experience some constipation if taking pain medication after surgery.  Increasing fluid intake and taking a stool softener will usually help or prevent this problem from occurring.  A mild laxative (Milk of Magnesia or Miralax) should be taken according to package directions if there are no bowel movements after 48 hours. °7.  You may have steri-strips (small skin tapes) in place directly over the incision.  These strips should be left on the skin for 7-10 days.  If your  surgeon used skin glue on the incision, you may shower in 24 hours.  The glue will flake off over the next 2-3 weeks.  Any sutures or staples will be removed at the office during your follow-up visit. You may find that a light gauze bandage over your incision may keep your staples from being rubbed or pulled. You may shower and replace the bandage daily. °8. ACTIVITIES:  You may resume regular (light) daily activities beginning the next day--such as daily self-care, walking, climbing stairs--gradually increasing activities as tolerated.  You may have sexual intercourse when it is comfortable.  Refrain from any heavy lifting or straining until approved by your doctor. °a. You may drive when you no longer are taking prescription pain medication, you can comfortably wear a seatbelt, and you can safely maneuver your car and apply brakes °b. Return to Work: ___________________________________ °9. You should see your doctor in the office for a follow-up appointment approximately two weeks after your surgery.  Make sure that you call for this appointment within a day or two after you arrive home to insure a convenient appointment time. °OTHER INSTRUCTIONS:  °_____________________________________________________________ °_____________________________________________________________ ° °WHEN TO CALL YOUR DOCTOR: °1. Fever over 101.0 °2. Inability to urinate °3. Nausea and/or vomiting °4. Extreme swelling or bruising °5. Continued bleeding from incision. °6. Increased pain, redness, or drainage from the incision. °7. Difficulty swallowing or breathing °8. Muscle cramping or spasms. °9. Numbness or tingling in hands or feet or around lips. ° °The clinic staff is available to   answer your questions during regular business hours.  Please dont hesitate to call and ask to speak to one of the nurses if you have concerns.  For further questions, please visit [email or web address]    MIDLINE WOUND CARE: - midline  dressing to be changed once daily - supplies: sterile saline, kerlix, scissors, ABD pads, tape  - remove dressing and all packing carefully, moistening with sterile saline as needed to avoid packing/internal dressing sticking to the wound. - clean edges of skin around the wound with water/gauze, making sure there is no tape debris or leakage left on skin that could cause skin irritation or breakdown. - dampen and clean kerlix with sterile saline and pack wound from wound base to skin level, making sure to take note of any possible areas of wound tracking, tunneling and packing appropriately. Wound can be packed loosely. Trim kerlix to size if a whole kerlix is not required. - cover wound with a dry ABD pad and secure with tape.  - write the date/time on the dry dressing/tape to better track when the last dressing change occurred. - change dressing as needed if leakage occurs, wound gets contaminated, or patient requests to shower. - patient may shower daily with wound open and following the shower the wound should be dried and a clean dressing placed.

## 2016-08-13 NOTE — Interval H&P Note (Signed)
History and Physical Interval Note:  08/13/2016 9:43 AM  [name] [name]  has presented today for surgery, with the diagnosis of hf  The various methods of treatment have been discussed with the patient and family. After consideration of risks, benefits and other options for treatment, the patient has consented to  Procedure(s): Right/Left Heart Cath and Coronary Angiography (N/A) and possible coronary angioplasty as a surgical intervention .  The patient's history has been reviewed, patient examined, no change in status, stable for surgery.  I have reviewed the patient's chart and labs.  Questions were answered to the patient's satisfaction.     Bensimhon, Quillian Quince

## 2016-08-13 NOTE — Progress Notes (Signed)
PROGRESS NOTE  [name] [name] EQA:[id number] DOB: Feb 24, 1950 DOA: 08/04/2016 PCP: Patient, No Pcp Per   Brief summary:  Out of hospital Cardiac arrest, s/p cpr/defib, found to have  gi perforation s/p repair, rib fracture with large chest wall hematoma,t8 vertebral fracture, thought all from cpr, has LV thrombus, chf, delirium General surgery, cardiology, EP, thoracic surgery consulted Cardiac cath planned on 8/1,  CIR placement vs home health at discharge   HPI/Recap of past 24 hours: Patient has no new complaints. No acute issues overnight.    Assessment/Plan: Active Problems:   Pneumoperitoneum   Syncope and collapse   Acute respiratory failure (HCC)   Frequent PVCs   Cardiomyopathy, ischemic   Chest wall trauma   Perforated gastric ulcer (HCC)   Tachycardia   Hypotension due to drugs   Benign essential HTN   Labile blood pressure   Pleural effusion   Hematoma of chest wall    Cardiac arrest (presenting symptom):  -Out of hospital VF/VT arrest suspected, witness - unclear etiology. Required 1 shock and 2 - 3 minutes CPR before ROSC. 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 -He was intubated and admitted to icu,  -He has improved , extubated and transferred to hospitalist service on 7/27 - Awaiting results of heart catheterization performed today.  LV thrombus:  heparin drip held on 7/28 due to expanding large left chest wall hematoma - Hemoglobin stable and on last check a 8.9 and before that 9.1  Systolic CHF  -Echo on 9/79 lvef 35-40% with grade 1 diastolic dysfunction -Ct chest on 7/28 "Moderate bilateral pleural effusions are noted with adjacent subsegmental atelectasis of both lower lobes. -cardiology /heart failure team consulted, currently he is on lasix/spironolactone, coreg, entresto   Hypokalemia/hypomagnesemia: replace k/mag   PERFORATED STOMACH ULCER  vs perforation from CPR -peunoperitoneum on initial ct scan, underwent emergency exploratory laparotomy  EXPLORATORY LAPAROTOMY FREE AIR BIOPSY OF PERFORATED STOMACH ULCER REPAIR OF PERFORATED STOMACH ULCER WITH A OMENTAL PATCH (N/A) on 7/23 -He was on iv ppi, now on oral ppi -He is started back on heart healthy diet - He was on zosyn then unasyn since admission, day 9 abx as of 7/31, general surgery to decide abx duration    Large chest wall hematoma, rib fracture, acute blood loss anemia ( RN reported a fall on 7/27, however chest wall hematoma started prior to the suspected fall)  -Thoracic surgery Dr Lucianne Lei trigt consulted, conservation management, pressure dressing,   -s/p prbc transfusion/ffp, hold heparin drip - At this point be monitored thought to be secondary to rib fracture  Stable probable nondisplaced fracture involving T8 vertebral body. He denies back pain.  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  ICU delirium? He also has h/o alcohol use improving  Code Status: full  Family Communication: patient no family at bedside  Disposition Plan:  Pending final results from cardiology after heart cath  Consultants:  pccm admit, transferred to [facility] on 7/27  General surgery  cardiology /heart failure team  Thoracic surgery Dr Prescott Gum  Procedures:  Intubation/extubation  EXPLORATORY LAPAROTOMY FREE AIR BIOPSY OF PERFORATED STOMACH ULCER REPAIR OF PERFORATED STOMACH ULCER WITH A OMENTAL PATCH (N/A)  prbc /ffp transfusion  Left IJ placement and removal   .    Antibiotics:  Zosyn then unasyn  eraxis then diflucan (last dose on 7/29)   Objective: BP 137/70 (BP Location: Left Arm)  Pulse 85   Temp 98.5 F (36.9 C) (Oral)   Resp 17   Ht 5\' 8"  (1.727 m)   Wt 68.1 kg (150 lb 3.2 oz)   SpO2 95%   BMI 22.84 kg/m   Intake/Output Summary (Last 24 hours) at 08/13/16 1652 Last data filed at 08/13/16 1301  Gross per 24 hour    Intake           527.83 ml  Output             1555 ml  Net         -1027.17 ml   Filed Weights   08/11/16 0554 08/12/16 0550 08/13/16 0423  Weight: 65.5 kg (144 lb 6.4 oz) 64 kg (141 lb 1.5 oz) 68.1 kg (150 lb 3.2 oz)    Exam:   General:   Awake and alert, no acute distress  Cardiovascular: RRR with ectopic beats, no gallops or rubs   Respiratory: diminished at basis, no wheezing, no rhonchi  Abdomen: potst op changes, dressing intact, soft, nondistended, drain in place with serosanguinous fluids, positive BS  Musculoskeletal: No lower extremity Edema, left arm edema is improving  Neuro: slightly confused about the month, oriented to year/place/person  Data Reviewed: Basic Metabolic Panel:  Recent Labs Lab 08/07/16 0232  08/09/16 0526 08/10/16 0745 08/11/16 0528 08/12/16 0404 08/13/16 0259  NA 138  < > 139 139 138 136 132*  K 3.8  < > 3.6 3.0* 3.3* 3.9 4.1  CL 107  < > 106 108 107 107 105  CO2 24  < > 23 23 24  21* 23  GLUCOSE 84  < > 134* 106* 108* 136* 123*  BUN 10  < > 24* 22* 14 11 9   CREATININE 0.79  < > 0.93 0.85 0.80 0.76 0.75  CALCIUM 7.8*  < > 7.9* 7.5* 7.4* 7.6* 7.5*  MG 1.7  --  2.3 1.8 1.6* 1.7 2.1  PHOS 2.4*  --   --   --   --   --   --   < > = values in this interval not displayed. Liver Function Tests:  Recent Labs Lab 08/10/16 0745 08/11/16 0528 08/13/16 0259  AST 58* 54* 44*  ALT 27 28 33  ALKPHOS 31* 34* 40  BILITOT 1.3* 1.4* 1.6*  PROT 4.2* 4.0* 4.2*  ALBUMIN 2.2* 2.0* 2.0*   No results for input(s): LIPASE, AMYLASE in the last 168 hours. No results for input(s): AMMONIA in the last 168 hours. CBC:  Recent Labs Lab 08/09/16 0526 08/09/16 1959 08/10/16 0745 08/11/16 0528 08/12/16 0404 08/13/16 0259  WBC 14.5*  --  10.1 9.4 11.8* 12.4*  HGB 6.8* 8.2* 7.5* 8.5* 9.1* 8.9*  HCT 19.8* 22.8* 21.1* 25.0* 26.6* 26.0*  MCV 98.0  --  92.5 92.3 92.7 93.5  PLT 268  --  186 192 227 245   Cardiac Enzymes:   No results for input(s):  CKTOTAL, CKMB, CKMBINDEX, TROPONINI in the last 168 hours. BNP (last 3 results) No results for input(s): BNP in the last 8760 hours.  ProBNP (last 3 results) No results for input(s): PROBNP in the last 8760 hours.  CBG:  Recent Labs Lab 08/08/16 2132 08/09/16 0730 08/09/16 1125 08/09/16 1632 08/09/16 2114  GLUCAP 159* 147* 128* 136* 127*    Recent Results (from the past 240 hour(s))  MRSA PCR Screening     Status: None   Collection Time: 08/04/16  7:03 PM  Result Value Ref Range Status   MRSA  by PCR NEGATIVE NEGATIVE Final    Comment:        The GeneXpert MRSA Assay (FDA approved for NASAL specimens only), is one component of a comprehensive MRSA colonization surveillance program. It is not intended to diagnose MRSA infection nor to guide or monitor treatment for MRSA infections.   Culture, blood (Routine X 2) w Reflex to ID Panel     Status: None   Collection Time: 08/04/16  7:30 PM  Result Value Ref Range Status   Specimen Description BLOOD LEFT HAND  Final   Special Requests IN PEDIATRIC BOTTLE Blood Culture adequate volume  Final   Culture NO GROWTH 5 DAYS  Final   Report Status 08/09/2016 FINAL  Final  Culture, blood (Routine X 2) w Reflex to ID Panel     Status: None   Collection Time: 08/04/16  7:42 PM  Result Value Ref Range Status   Specimen Description BLOOD LEFT WRIST  Final   Special Requests IN PEDIATRIC BOTTLE Blood Culture adequate volume  Final   Culture NO GROWTH 5 DAYS  Final   Report Status 08/09/2016 FINAL  Final     Studies: No results found.  Scheduled Meds: . carvedilol  6.25 mg Oral BID WC  . Chlorhexidine Gluconate Cloth  6 each Topical Daily  . docusate sodium  100 mg Oral BID  . folic acid  1 mg Oral Daily  . furosemide  40 mg Oral Daily  . multivitamin with minerals  1 tablet Oral Daily  . pantoprazole  40 mg Oral BID  . pneumococcal 23 valent vaccine  0.5 mL Intramuscular Tomorrow-1000  . potassium chloride  40 mEq Oral BID    . rosuvastatin  10 mg Oral q1800  . sacubitril-valsartan  1 tablet Oral BID  . sodium chloride flush  10-40 mL Intracatheter Q12H  . sodium chloride flush  3 mL Intravenous Q12H  . spironolactone  25 mg Oral Daily  . thiamine  100 mg Oral Daily    Continuous Infusions: . sodium chloride 10 mL/hr at 08/07/16 0600  . sodium chloride    . sodium chloride    . sodium chloride    . sodium chloride    . sodium chloride    . ampicillin-sulbactam (UNASYN) IV Stopped (08/13/16 0539)     Time spent: 7mins  Velvet Bathe MD, PhD  [facility] Pager [phone] If 7PM-7AM, please contact night-coverage at [email or web address], password [facility] 08/13/2016, 4:52 PM  LOS: 9 days

## 2016-08-14 ENCOUNTER — Encounter (HOSPITAL_COMMUNITY): Payer: Self-pay

## 2016-08-14 LAB — CBC
HCT: 28 % — ABNORMAL LOW (ref 39.0–52.0)
Hemoglobin: 9.3 g/dL — ABNORMAL LOW (ref 13.0–17.0)
MCH: 31.2 pg (ref 26.0–34.0)
MCHC: 33.2 g/dL (ref 30.0–36.0)
MCV: 94 fL (ref 78.0–100.0)
PLATELETS: 303 10*3/uL (ref 150–400)
RBC: 2.98 MIL/uL — AB (ref 4.22–5.81)
RDW: 17.6 % — ABNORMAL HIGH (ref 11.5–15.5)
WBC: 13.1 10*3/uL — ABNORMAL HIGH (ref 4.0–10.5)

## 2016-08-14 LAB — PREPARE RBC (CROSSMATCH)

## 2016-08-14 LAB — MAGNESIUM: MAGNESIUM: 1.9 mg/dL (ref 1.7–2.4)

## 2016-08-14 MED ORDER — SACUBITRIL-VALSARTAN 49-51 MG PO TABS
1.0000 | ORAL_TABLET | Freq: Two times a day (BID) | ORAL | Status: DC
  Administered 2016-08-14: 1 via ORAL
  Filled 2016-08-14 (×2): qty 1

## 2016-08-14 MED ORDER — DEXTROSE 5 % IV SOLN
1.5000 g | INTRAVENOUS | Status: AC
  Administered 2016-08-15: 1.5 g via INTRAVENOUS
  Filled 2016-08-14 (×2): qty 1.5

## 2016-08-14 MED ORDER — SODIUM CHLORIDE 0.9 % IV SOLN
Freq: Once | INTRAVENOUS | Status: AC
  Administered 2016-08-14: 11:00:00 via INTRAVENOUS

## 2016-08-14 NOTE — Progress Notes (Signed)
PROGRESS NOTE  [name] [name] VFI:[id number] DOB: 06/14/1950 DOA: 08/04/2016 PCP: Patient, No Pcp Per   Brief summary:  Out of hospital Cardiac arrest, s/p cpr/defib, found to have  gi perforation s/p repair, rib fracture with large chest wall hematoma,t8 vertebral fracture, thought all from cpr, has LV thrombus, chf, delirium General surgery, cardiology, EP, thoracic surgery consulted Cardiac cath planned on 8/1,  CIR placement vs home health at discharge  HPI/Recap of past 24 hours: No new complaints reported. Question was raised about chest wall hematoma whether it was getting drained. Reportedly hematoma decreasing in size.  Assessment/Plan: Active Problems:   Pneumoperitoneum   Syncope and collapse   Acute respiratory failure (HCC)   Frequent PVCs   Cardiomyopathy, ischemic   Chest wall trauma   Perforated gastric ulcer (HCC)   Tachycardia   Hypotension due to drugs   Benign essential HTN   Labile blood pressure   Pleural effusion   Hematoma of chest wall   Cardiac arrest (presenting symptom):  - Out of hospital VF/VT arrest suspected, witness - unclear etiology. Required 1 shock and 2 - 3 minutes CPR before ROSC. 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 - He was intubated and admitted to icu,  - He has improved , extubated and transferred to hospitalist service on 7/27 - Pt being fitted for life vest  LV thrombus:  heparin drip held on 7/28 due to expanding large left chest wall hematoma - Hemoglobin stable and on last check a 9.3   Systolic CHF  -Echo on 4/16 lvef 35-40% with grade 1 diastolic dysfunction -Ct chest on 7/28 "Moderate bilateral pleural effusions are noted with adjacent subsegmental atelectasis of both lower lobes. -cardiology /heart failure team consulted, currently he is on lasix/spironolactone, coreg, entresto   Hypokalemia/hypomagnesemia:  -  reassess next am. WNL on last check.   PERFORATED STOMACH ULCER vs perforation from CPR -peunoperitoneum on initial ct scan, underwent emergency exploratory laparotomy  EXPLORATORY LAPAROTOMY FREE AIR BIOPSY OF PERFORATED STOMACH ULCER REPAIR OF PERFORATED STOMACH ULCER WITH A OMENTAL PATCH (N/A) on 7/23 - He was on iv ppi, now on oral ppi - He is started back on heart healthy diet - He was on zosyn then unasyn since admission, day 9 abx as of 7/31, general surgery to decide abx duration    Large chest wall hematoma, rib fracture, acute blood loss anemia ( RN reported a fall on 7/27, however chest wall hematoma started prior to the suspected fall)  -Thoracic surgery Dr Lucianne Lei trigt consulted, conservation management, pressure dressing,   -s/p prbc transfusion/ffp, hold heparin drip - Question whether this would be drained or not. On last not from cardiothoracic surgeon and was not supposed to be drained. But in cardiology note there was plans for drainage. Will have nursing talk to cardiothoracic surgery team for clarification. thought to be secondary to rib fracture  Stable probable nondisplaced fracture involving T8 vertebral body. He denies back pain.  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 improving  Code Status: full  Family Communication: Discussed with patient and family member at bedside  Disposition Plan:  Undergoing LifeVest placement/fitting  Consultants:  pccm admit, transferred to [facility] hospitalist on 7/27  General surgery  cardiology /heart failure team  Thoracic surgery Dr Prescott Gum  Procedures:  Intubation/extubation  EXPLORATORY LAPAROTOMY FREE AIR BIOPSY OF [place]  OF PERFORATED STOMACH ULCER WITH A OMENTAL PATCH (N/A)  prbc /ffp transfusion  Left IJ placement and removal   .    Antibiotics:  Zosyn then unasyn  eraxis then diflucan (last dose on 7/29)   Objective: BP 99/62    Pulse 89   Temp 98.4 F (36.9 C) (Oral)   Resp 18   Ht 5\' 8"  (1.727 m)   Wt 63 kg (138 lb 12.8 oz)   SpO2 98%   BMI 21.10 kg/m   Intake/Output Summary (Last 24 hours) at 08/14/16 1703 Last data filed at 08/14/16 1637  Gross per 24 hour  Intake              600 ml  Output             1680 ml  Net            -1080 ml   Filed Weights   08/12/16 0550 08/13/16 0423 08/14/16 0557  Weight: 64 kg (141 lb 1.5 oz) 68.1 kg (150 lb 3.2 oz) 63 kg (138 lb 12.8 oz)    Exam:   General:   Awake and alert, no acute distress  Cardiovascular: RRR with ectopic beats, no gallops or rubs   Respiratory: diminished at basis, no wheezing, no rhonchi  Abdomen: potst op changes, Left chest wall elevated, soft, nondistended,  positive BS  Musculoskeletal: No lower extremity Edema, left arm edema is improving  Neuro: Awake and alert, no facial asymmetry, oriented to year/place/person  Data Reviewed: Basic Metabolic Panel:  Recent Labs Lab 08/09/16 0526 08/10/16 0745 08/11/16 0528 08/12/16 0404 08/13/16 0259 08/14/16 1107  NA 139 139 138 136 132*  --   K 3.6 3.0* 3.3* 3.9 4.1  --   CL 106 108 107 107 105  --   CO2 23 23 24  21* 23  --   GLUCOSE 134* 106* 108* 136* 123*  --   BUN 24* 22* 14 11 9   --   CREATININE 0.93 0.85 0.80 0.76 0.75  --   CALCIUM 7.9* 7.5* 7.4* 7.6* 7.5*  --   MG 2.3 1.8 1.6* 1.7 2.1 1.9   Liver Function Tests:  Recent Labs Lab 08/10/16 0745 08/11/16 0528 08/13/16 0259  AST 58* 54* 44*  ALT 27 28 33  ALKPHOS 31* 34* 40  BILITOT 1.3* 1.4* 1.6*  PROT 4.2* 4.0* 4.2*  ALBUMIN 2.2* 2.0* 2.0*   No results for input(s): LIPASE, AMYLASE in the last 168 hours. No results for input(s): AMMONIA in the last 168 hours. CBC:  Recent Labs Lab 08/10/16 0745 08/11/16 0528 08/12/16 0404 08/13/16 0259 08/14/16 0303  WBC 10.1 9.4 11.8* 12.4* 13.1*  HGB 7.5* 8.5* 9.1* 8.9* 9.3*  HCT 21.1* 25.0* 26.6* 26.0* 28.0*  MCV 92.5 92.3 92.7 93.5 94.0  PLT 186 192 227  245 303   Cardiac Enzymes:   No results for input(s): CKTOTAL, CKMB, CKMBINDEX, TROPONINI in the last 168 hours. BNP (last 3 results) No results for input(s): BNP in the last 8760 hours.  ProBNP (last 3 results) No results for input(s): PROBNP in the last 8760 hours.  CBG:  Recent Labs Lab 08/08/16 2132 08/09/16 0730 08/09/16 1125 08/09/16 1632 08/09/16 2114  GLUCAP 159* 147* 128* 136* 127*    Recent Results (from the past 240 hour(s))  MRSA PCR Screening     Status: None   Collection Time: 08/04/16  7:03 PM  Result Value Ref Range Status   MRSA by PCR NEGATIVE NEGATIVE Final  Comment:        The GeneXpert MRSA Assay (FDA approved for NASAL specimens only), is one component of a comprehensive MRSA colonization surveillance program. It is not intended to diagnose MRSA infection nor to guide or monitor treatment for MRSA infections.   Culture, blood (Routine X 2) w Reflex to ID Panel     Status: None   Collection Time: 08/04/16  7:30 PM  Result Value Ref Range Status   Specimen Description BLOOD LEFT HAND  Final   Special Requests IN PEDIATRIC BOTTLE Blood Culture adequate volume  Final   Culture NO GROWTH 5 DAYS  Final   Report Status 08/09/2016 FINAL  Final  Culture, blood (Routine X 2) w Reflex to ID Panel     Status: None   Collection Time: 08/04/16  7:42 PM  Result Value Ref Range Status   Specimen Description BLOOD LEFT WRIST  Final   Special Requests IN PEDIATRIC BOTTLE Blood Culture adequate volume  Final   Culture NO GROWTH 5 DAYS  Final   Report Status 08/09/2016 FINAL  Final     Studies: No results found.  Scheduled Meds: . carvedilol  6.25 mg Oral BID WC  . Chlorhexidine Gluconate Cloth  6 each Topical Daily  . docusate sodium  100 mg Oral BID  . folic acid  1 mg Oral Daily  . furosemide  40 mg Oral Daily  . multivitamin with minerals  1 tablet Oral Daily  . pantoprazole  40 mg Oral BID  . pneumococcal 23 valent vaccine  0.5 mL  Intramuscular Tomorrow-1000  . potassium chloride  40 mEq Oral BID  . rosuvastatin  10 mg Oral q1800  . sacubitril-valsartan  1 tablet Oral BID  . sodium chloride flush  10-40 mL Intracatheter Q12H  . sodium chloride flush  3 mL Intravenous Q12H  . spironolactone  25 mg Oral Daily  . thiamine  100 mg Oral Daily    Continuous Infusions: . sodium chloride 10 mL/hr at 08/07/16 0600  . sodium chloride    . sodium chloride    . [START ON 08/15/2016] cefUROXime (ZINACEF)  IV       Time spent: > 35 mins  Velvet Bathe MD, PhD  [facility] Pager [phone] If 7PM-7AM, please contact night-coverage at [email or web address], password [facility] 08/14/2016, 5:03 PM  LOS: 10 days

## 2016-08-14 NOTE — Progress Notes (Addendum)
      [address]       [place] [address]             [phone]       1 Day Post-Op Procedure(s) (LRB): Right/Left Heart Cath and Coronary Angiography (N/A)  Subjective: Patient states glad foley is out and he is "peeing a lot".   Objective: Vital signs in last 24 hours: Temp:  [98.3 F (36.8 C)-98.6 F (37 C)] 98.3 F (36.8 C) (08/02 0557) Pulse Rate:  [76-89] 87 (08/02 0557) Cardiac Rhythm: Normal sinus rhythm (08/02 0720) Resp:  [15-56] 19 (08/02 0557) BP: (131-161)/(66-80) 147/78 (08/02 0557) SpO2:  [91 %-98 %] 93 % (08/02 0557) Weight:  [63 kg (138 lb 12.8 oz)] 63 kg (138 lb 12.8 oz) (08/02 0557)      Intake/Output from previous day: 08/01 0701 - 08/02 0700 In: 120 [P.O.:120] Out: 2694 [Urine:1550; Drains:30]   Physical Exam:  Cardiovascular: RRR Pulmonary: Clear to auscultation bilaterally;hematoma left anterior chest wall that is tender to touch. Dressing is dry and intact.   Lab Results: CBC:  Recent Labs  08/13/16 0259 08/14/16 0303  WBC 12.4* 13.1*  HGB 8.9* 9.3*  HCT 26.0* 28.0*  PLT 245 303   BMET:   Recent Labs  08/12/16 0404 08/13/16 0259  NA 136 132*  K 3.9 4.1  CL 107 105  CO2 21* 23  GLUCOSE 136* 123*  BUN 11 9  CREATININE 0.76 0.75  CALCIUM 7.6* 7.5*    PT/INR: No results for input(s): LABPROT, INR in the last 72 hours. ABG:  INR: Will add last result for INR, ABG once components are confirmed Will add last 4 CBG results once components are confirmed  Assessment/Plan:  1. CV - Likely had VF/VT out of hospital arrest, ischemic cardiomyopathy. PVCs/NSVT previously. HR in the 90's. On Coreg 6.25 mg bid, Entresto 24-26 mg bid, and Spironolactone 25 mg daily. S/p right and left heart catheterization-results noted. 2.  Pulmonary - Rib fracture with large hematoma anterior left chest wall. Per Dr. Prescott Gum, will monitor hematoma for now, but may need drainage. 3. Anemia-H and H stable at 9.3 and 28. Has  had previous transfusion. 4. GI-s/p slap for free air secondary to perforated stomach. Tolerating diet. 5. Management per primary  ZIMMERMAN,DONIELLE MPA-C 08/14/2016,8:04 AM Patient examined, cardiac cath and echocardiogram images reviewed Will plan drainage of hematoma prior to discharge incase external defibrillator is needed by patient P Prescott Gum MD

## 2016-08-14 NOTE — Care Management Important Message (Signed)
Important Message  Patient Details  Name: [name] [name] MRN: [id number] Date of Birth: 1950-06-02   Medicare Important Message Given:  Yes    Delphina Schum Abena 08/14/2016, 10:12 AM

## 2016-08-14 NOTE — Progress Notes (Signed)
Physical Therapy Treatment Patient Details Name: [name] [name] MRN: [id number] DOB: April 14, 1950 Today's Date: 08/14/2016    History of Present Illness 66 year old male with unknown past medical history who arrived by [facility] after witnessed collapse in [place].  Bystander CPR for 2-3 mins.  VF/VT arrest. Pneumoperitoneum. Underwent EXPLORATORY LAPAROTOMY FREE AIR BIOPSY OF PERFORATED STOMACH ULCER REPAIR OF PERFORATED STOMACH ULCER 7/23. Pt intubated 7/23 and extubated 7/27.    PT Comments    Patient continues to make progress toward mobility goals. Pt tolerated increased gait distance with one standing rest break. 2/4 DOE and SpO2 down to 89% on RA with ambulation. Overall min guard/supervision for safety. Current plan remains appropriate.     Follow Up Recommendations  Home health PT     Equipment Recommendations  3in1 (PT);Rolling walker with 5" wheels;Other (comment) (Shower seat)    Recommendations for Other Services       Precautions / Restrictions Precautions Precautions: Fall Restrictions Weight Bearing Restrictions: No    Mobility  Bed Mobility               General bed mobility comments: pt OOB in chair upon arrival  Transfers Overall transfer level: Needs assistance Equipment used: Rolling walker (2 wheeled) Transfers: Sit to/from Stand Sit to Stand: Min guard         General transfer comment: cues for safe hand placement  Ambulation/Gait Ambulation/Gait assistance: Supervision Ambulation Distance (Feet): 200 Feet Assistive device: Rolling walker (2 wheeled) Gait Pattern/deviations: Decreased stride length;Step-through pattern     General Gait Details: cues for posture; one brief standing rest break; cues for breathing technique; pt with 2/4 DOE with mobility; SpO2 down to 89% on RA with ambulation   Stairs            Wheelchair Mobility    Modified Rankin (Stroke Patients Only)       Balance Overall balance  assessment: Needs assistance Sitting-balance support: Feet supported;No upper extremity supported Sitting balance-Leahy Scale: Normal       Standing balance-Leahy Scale: Fair Standing balance comment: able to static stand without UE support                            Cognition Arousal/Alertness: Awake/alert Behavior During Therapy: WFL for tasks assessed/performed Overall Cognitive Status: Within Functional Limits for tasks assessed                                        Exercises      General Comments General comments (skin integrity, edema, etc.): wife present; pt educated on activity progression       Pertinent Vitals/Pain Pain Location: discomfort in chest due to L side hematoma Pain Intervention(s): Monitored during session    Home Living                      Prior Function            PT Goals (current goals can now be found in the care plan section) Acute Rehab PT Goals Patient Stated Goal: To be able to walk his dog PT Goal Formulation: With patient/family Potential to Achieve Goals: Good Progress towards PT goals: Progressing toward goals    Frequency    Min 3X/week      PT Plan Current plan remains appropriate (Update d/c to home with  HHPT and possible HHOT)    Co-evaluation              AM-PAC PT "6 Clicks" Daily Activity  Outcome Measure  Difficulty turning over in bed (including adjusting bedclothes, sheets and blankets)?: None Difficulty moving from lying on back to sitting on the side of the bed? : A Little Difficulty sitting down on and standing up from a chair with arms (e.g., wheelchair, bedside commode, etc,.)?: A Little Help needed moving to and from a bed to chair (including a wheelchair)?: A Little Help needed walking in hospital room?: A Little Help needed climbing 3-5 steps with a railing? : A Lot 6 Click Score: 18    End of Session Equipment Utilized During Treatment: Gait belt Activity  Tolerance: Patient tolerated treatment well Patient left: in chair;with call bell/phone within reach;with family/visitor present Nurse Communication: Mobility status PT Visit Diagnosis: Unsteadiness on feet (R26.81);Muscle weakness (generalized) (M62.81);History of falling (Z91.81)     Time: 1031-1100 PT Time Calculation (min) (ACUTE ONLY): 29 min  Charges:  $Gait Training: 8-22 mins $Therapeutic Activity: 8-22 mins                    G Codes:       Earney Navy, PTA Pager: [phone]     Darliss Cheney 08/14/2016, 11:14 AM

## 2016-08-14 NOTE — Progress Notes (Signed)
[employer] order and documentation faxed. Clair Gulling with [employer] made aware.  Renee Pain, RN

## 2016-08-14 NOTE — Progress Notes (Signed)
Progress Note  Patient Name: [name] [name] Date of Encounter: 08/14/2016  Primary Cardiologist: New  Subjective   Denies SOB, orthopnea. Sitting up in chair, feels well this morning. No confusion  Left/Right heart cath 08/13/16  Ost RCA lesion, 80 %stenosed.  Ost LM lesion, 40 %stenosed.  Ost Ramus to Ramus lesion, 80 %stenosed.  Ost LAD to Prox LAD lesion, 70 %stenosed.  Ost 1st Diag to 1st Diag lesion, 75 %stenosed.  Mid LAD to Dist LAD lesion, 40 %stenosed.   Findings:  Ao = 141/70 (102) LV =  126/13 RA =  5 RV = 29/8 PA = 33/9 (22) PCW = 12 Fick cardiac output/index = 5.3/3.0 PVR = 1.9 WU Ao sat = 92%  PA sat = 55%,54%   Inpatient Medications    Scheduled Meds: . carvedilol  6.25 mg Oral BID WC  . Chlorhexidine Gluconate Cloth  6 each Topical Daily  . docusate sodium  100 mg Oral BID  . folic acid  1 mg Oral Daily  . furosemide  40 mg Oral Daily  . multivitamin with minerals  1 tablet Oral Daily  . pantoprazole  40 mg Oral BID  . pneumococcal 23 valent vaccine  0.5 mL Intramuscular Tomorrow-1000  . potassium chloride  40 mEq Oral BID  . rosuvastatin  10 mg Oral q1800  . sacubitril-valsartan  1 tablet Oral BID  . sodium chloride flush  10-40 mL Intracatheter Q12H  . sodium chloride flush  3 mL Intravenous Q12H  . spironolactone  25 mg Oral Daily  . thiamine  100 mg Oral Daily   Continuous Infusions: . sodium chloride 10 mL/hr at 08/07/16 0600  . sodium chloride    . sodium chloride    . sodium chloride    . sodium chloride    . sodium chloride     PRN Meds: sodium chloride, sodium chloride, acetaminophen, fentaNYL (SUBLIMAZE) injection, ondansetron (ZOFRAN) IV, sodium chloride flush, sodium chloride flush   Vital Signs    Vitals:   08/13/16 1055 08/13/16 1302 08/13/16 2005 08/14/16 0557  BP: (!) 143/75 137/70 131/66 (!) 147/78  Pulse: 79 85 87 87  Resp: 17  18 19   Temp:   98.6 F (37 C) 98.3 F (36.8 C)  TempSrc:   Oral Oral    SpO2: 95%  98% 93%  Weight:    138 lb 12.8 oz (63 kg)  Height:        Intake/Output Summary (Last 24 hours) at 08/14/16 0859 Last data filed at 08/14/16 0830  Gross per 24 hour  Intake              360 ml  Output             1380 ml  Net            -1020 ml   Filed Weights   08/12/16 0550 08/13/16 0423 08/14/16 0557  Weight: 141 lb 1.5 oz (64 kg) 150 lb 3.2 oz (68.1 kg) 138 lb 12.8 oz (63 kg)    Telemetry   NSR - personally reviewed.    Physical Exam   General: NAD, sitting in chair.  HEENT: normal Neck: supple. JVP 5-6 cm. Carotids 2+ bilat; no bruits. No lymphadenopathy or thryomegaly appreciated. Cor: PMI nondisplaced. Regular rate and rhythm. Chest wall hematoma covered with dressing, intact. Skin intact. Soft.  Lungs: CTAB Abdomen:  Soft NT/NT. Dressing intact. + bowel sounds, incisions healing.  Extremities: no cyanosis, clubbing, rash, edema LUE hematoma  improving.  Neuro: Alert and oriented x 3. cranial nerves grossly intact. moves all 4 extremities w/o difficulty. Affect pleasant Skin: L flank /LUE ecchymotic    Labs    Chemistry  Recent Labs Lab 08/10/16 0745 08/11/16 0528 08/12/16 0404 08/13/16 0259  NA 139 138 136 132*  K 3.0* 3.3* 3.9 4.1  CL 108 107 107 105  CO2 23 24 21* 23  GLUCOSE 106* 108* 136* 123*  BUN 22* 14 11 9   CREATININE 0.85 0.80 0.76 0.75  CALCIUM 7.5* 7.4* 7.6* 7.5*  PROT 4.2* 4.0*  --  4.2*  ALBUMIN 2.2* 2.0*  --  2.0*  AST 58* 54*  --  44*  ALT 27 28  --  33  ALKPHOS 31* 34*  --  40  BILITOT 1.3* 1.4*  --  1.6*  GFRNONAA >60 >60 >60 >60  GFRAA >60 >60 >60 >60  ANIONGAP 8 7 8  4*     Hematology  Recent Labs Lab 08/12/16 0404 08/13/16 0259 08/14/16 0303  WBC 11.8* 12.4* 13.1*  RBC 2.87* 2.78* 2.98*  HGB 9.1* 8.9* 9.3*  HCT 26.6* 26.0* 28.0*  MCV 92.7 93.5 94.0  MCH 31.7 32.0 31.2  MCHC 34.2 34.2 33.2  RDW 17.9* 17.9* 17.6*  PLT 227 245 303    Cardiac Enzymes No results for input(s): TROPONINI in the last  168 hours.  No results for input(s): TROPIPOC in the last 168 hours.    Radiology    No results found.  Cardiac Studies   Transthoracic Echocardiogram  08/06/2016  Study Conclusions  - Left ventricle: The cavity size was normal. There was mild focal   basal hypertrophy of the septum. Systolic function was moderately   reduced. The estimated ejection fraction was in the range of 35%   to 40%. There is akinesis of the apical myocardium. There is   akinesis of the apicalanterior, lateral, inferolateral, and   inferior myocardium. There is akinesis of the midanteroseptal   myocardium. There was an increased relative contribution of   atrial contraction to ventricular filling. Doppler parameters are   consistent with abnormal left ventricular relaxation (grade 1   diastolic dysfunction). There was an apparent, medium-sized,   apicalthrombus. - Pulmonary arteries: Systolic pressure could not be accurately   estimated. - Pericardium, extracardiac: There was a large left pleural   effusion.   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 CT chest also showed atherosclerosis and mildly displaced anterior left second rib fracture as well as stable probable nondisplaced fracture involving T8 vertebral body. Echo with EF 35-40% with LV thrombus  Developed large chet wall hematoma on 7/27 in setting of AC and fall.   Assessment & Plan     1. Cardiac ArrestVT/VF:  --Suspect primary event was VT/VF arrest in setting of known ischemic CM (MI in late 80-90. Cared for by Dr. Doreatha Lew. No f/u since. Reports cath at that time with what sounds like POBA) - suspect perforated stomach and rib fx sequale from CPR -ECHO EF 35-40%. - Will need to set up LifeVest, will place order.  - Keep K .4.0 Mg > 2.0, BMET pending.   2. CAD with ischemic CM - No CP.  - ASA on  hold due to hematoma - Volume status stable.  - Continue b-blocker and statin and ASA.  - On low dose entresto and carvedilol -  Continue spiro to 25 mg daily.  Renal function stable.   - Cath with 3V disease, medical therapy. If develops angina can consider PCI to Ramus.   3. Perforated stomach - s/p repair. Suspect due to CPR. Cannot exclude PUD though recent EGD ok.  - No change. Tolerating diet.   4. Frequent PVCs - May be contributing to systolic dysfunction. NSVT /PVCs.  - Continue Coreg 6.25 mg BID.  - Keep potassium > 4 and Magnesium > 2, BMET and Mg pending.   5. LV Thrombus:  - Has been off since 7/28 due to fall and chest wall hematoma.   - SCDs for DVT prophylaxis - No change.   6. Chest wall hematoma with symptomatic anemia -s/p 1 unit PRBC's on 7/29 - Hbg 9.3 this am.   7. Hypokalemia/hypomagnesemia:  - As above, BMET pending.   8. Acute delirium - resolved.   9. ETOH and tobacco abuse - has mild cirrhosis on previous CT.  - has cut back ETOH but hasn't quit - Counseled on need for cessation -  Watch for withdrawal. On folate and thiamine - No change.   10. Suspected Fall 08/08/16-  - left arm swollen with mildly decreased ROM> Xray L humerus --no fracture.  -Elevate LUE.  - Improved.    Arbutus Leas, NP  08/14/2016, 8:59 AM     Patient seen and examined with Jettie Booze, NP. We discussed all aspects of the encounter. I agree with the assessment and plan as stated above.   Cath results reviewed with them. Continue medical therapy.   Is recovering well. I took down chest dressing and still with sizeable hematoma. Spoke with Dr. Prescott Gum and he will plan drainage tomorrow. I also spoke with LifeVest team and they will come evaluate. Once hematoma drained and LifeVest placed should be ready for d/c. Can start E[place] 81. No AC at this time. Will increase Entresto.   Glori Bickers, MD  11:05 AM

## 2016-08-14 NOTE — Progress Notes (Signed)
Pt stated MD did not want another dressing on chest hematoma.  Will continue to monitor.

## 2016-08-14 NOTE — Progress Notes (Signed)
[facility] Progress Note  1 Day Post-Op  Subjective: CC:  Denies abdominal pain. Tolerated pancakes and oatmeal for breakfast. Denies fever, chills, nausea, vomiting. Eager for discharge.  Objective: Vital signs in last 24 hours: Temp:  [98.3 F (36.8 C)-98.6 F (37 C)] 98.3 F (36.8 C) (08/02 0557) Pulse Rate:  [76-89] 87 (08/02 0557) Resp:  [15-56] 19 (08/02 0557) BP: (131-161)/(66-80) 147/78 (08/02 0557) SpO2:  [91 %-98 %] 93 % (08/02 0557) Weight:  [63 kg (138 lb 12.8 oz)] 63 kg (138 lb 12.8 oz) (08/02 0557) Last BM Date: 08/12/16  Intake/Output from previous day: 08/01 0701 - 08/02 0700 In: 120 [P.O.:120] Out: 1580 [Urine:1550; Drains:30] Intake/Output this shift: No intake/output data recorded.  PE: Gen:  Alert, NAD, pleasant Pulm:  Normal effort Abd: Soft, non-tender, non-distended, midline incision c/d/i and granulating nicely; R JP drain with minimal serous drainage.  Skin: warm and dry, no rashes  Psych: A&Ox3   Lab Results:   Recent Labs  08/13/16 0259 08/14/16 0303  WBC 12.4* 13.1*  HGB 8.9* 9.3*  HCT 26.0* 28.0*  PLT 245 303   BMET  Recent Labs  08/12/16 0404 08/13/16 0259  NA 136 132*  K 3.9 4.1  CL 107 105  CO2 21* 23  GLUCOSE 136* 123*  BUN 11 9  CREATININE 0.76 0.75  CALCIUM 7.6* 7.5*   PT/INR No results for input(s): LABPROT, INR in the last 72 hours. CMP     Component Value Date/Time   NA 132 (L) 08/13/2016 0259   K 4.1 08/13/2016 0259   CL 105 08/13/2016 0259   CO2 23 08/13/2016 0259   GLUCOSE 123 (H) 08/13/2016 0259   BUN 9 08/13/2016 0259   CREATININE 0.75 08/13/2016 0259   CALCIUM 7.5 (L) 08/13/2016 0259   PROT 4.2 (L) 08/13/2016 0259   ALBUMIN 2.0 (L) 08/13/2016 0259   AST 44 (H) 08/13/2016 0259   ALT 33 08/13/2016 0259   ALKPHOS 40 08/13/2016 0259   BILITOT 1.6 (H) 08/13/2016 0259   GFRNONAA >60 08/13/2016 0259   GFRAA >60 08/13/2016 0259   Lipase  No results found for:  LIPASE     Studies/Results: No results found.  Anti-infectives: Anti-infectives    Start     Dose/Rate Route Frequency Ordered Stop   08/06/16 1200  fluconazole (DIFLUCAN) IVPB 400 mg     400 mg 100 mL/hr over 120 Minutes Intravenous Every 24 hours 08/06/16 0959 08/10/16 1400   08/06/16 1100  Ampicillin-Sulbactam (UNASYN) 3 g in sodium chloride 0.9 % 100 mL IVPB  Status:  Discontinued     3 g 200 mL/hr over 30 Minutes Intravenous Every 6 hours 08/06/16 0959 08/14/16 0722   08/05/16 1730  anidulafungin (ERAXIS) 100 mg in sodium chloride 0.9 % 100 mL IVPB  Status:  Discontinued     100 mg 78 mL/hr over 100 Minutes Intravenous Every 24 hours 08/04/16 1657 08/06/16 0959   08/05/16 0000  piperacillin-tazobactam (ZOSYN) IVPB 3.375 g  Status:  Discontinued     3.375 g 12.5 mL/hr over 240 Minutes Intravenous Every 8 hours 08/04/16 1717 08/06/16 0959   08/04/16 1800  piperacillin-tazobactam (ZOSYN) IVPB 3.375 g  Status:  Discontinued     3.375 g 100 mL/hr over 30 Minutes Intravenous To Surgery 08/04/16 1754 08/04/16 1848   08/04/16 1730  anidulafungin (ERAXIS) 200 mg in sodium chloride 0.9 % 200 mL IVPB     200 mg 78 mL/hr over 200 Minutes Intravenous  Once 08/04/16 1657  08/05/16 0222   08/04/16 1700  piperacillin-tazobactam (ZOSYN) IVPB 3.375 g  Status:  Discontinued     3.375 g 100 mL/hr over 30 Minutes Intravenous  Once 08/04/16 1655 08/06/16 0959       Assessment/Plan s/p Procedure(s): EXPLORATORY LAPAROTOMY FREE AIR BIOPSY OF PERFORATED STOMACH ULCER REPAIR OF PERFORATED STOMACH ULCER WITH A OMENTAL PATCH (N/A) - continue HH diet  - OK to shower  - mobilize as tolerated/IS  - abx d/c-ed yesterday - d/c JP drain; stable for discharge from surgical perspective; will F/U in our [facility] office in 2-4 weeks. May require one nurses visit proior to this for suture removal.  FEN - HH diet, hyponatremia (132 on 8/1),  ID - Zosyn 7/23-24, Unasyn 7/25-8/1 VTE - SCD, previously on heparin  gtt, held 2/28 due to chest wall hematoma  Dispo      LOS: 10 days    Jill Alexanders , [facility] 08/14/2016, 8:31 AM Pager: [phone] Consults: [phone] Mon-Fri 7:00 am-4:30 pm Sat-Sun 7:00 am-11:30 am

## 2016-08-14 NOTE — Progress Notes (Signed)
Occupational Therapy Treatment Patient Details Name: [name] [name] MRN: [id number] DOB: 1950-12-17 Today's Date: 08/14/2016    History of present illness 66 year old male with unknown past medical history who arrived by [facility] after witnessed collapse in [place].  Bystander CPR for 2-3 mins.  VF/VT arrest. Pneumoperitoneum. Underwent EXPLORATORY LAPAROTOMY FREE AIR BIOPSY OF PERFORATED STOMACH ULCER REPAIR OF PERFORATED STOMACH ULCER 7/23. Pt intubated 7/23 and extubated 7/27.   OT comments  Pt with improvements in mobility and activity tolerance today; able to perform toilet transfer with functional mobility in room with supervision and use of RW. Pt able to manage on/off toilet without use of grab bars or 3 in 1. D/c plan remains appropriate. Will continue to follow acutely.   Follow Up Recommendations  Home health OT;Supervision/Assistance - 24 hour    Equipment Recommendations  None recommended by OT    Recommendations for Other Services      Precautions / Restrictions Precautions Precautions: Fall Restrictions Weight Bearing Restrictions: No       Mobility Bed Mobility Overal bed mobility: Needs Assistance Bed Mobility: Sit to Supine       Sit to supine: Supervision   General bed mobility comments: for safety, no physical assist required. HOB flat without use of bed rails  Transfers Overall transfer level: Needs assistance Equipment used: Rolling walker (2 wheeled) Transfers: Sit to/from Stand Sit to Stand: Supervision         General transfer comment: cues for hand placement    Balance Overall balance assessment: Needs assistance Sitting-balance support: Feet supported;No upper extremity supported Sitting balance-Leahy Scale: Normal     Standing balance support: No upper extremity supported;During functional activity Standing balance-Leahy Scale: Fair Standing balance comment: able to static stand without UE support                            ADL either performed or assessed with clinical judgement   ADL Overall ADL's : Needs assistance/impaired                         Toilet Transfer: Supervision/safety;Ambulation;Regular Toilet;RW Toilet Transfer Details (indicate cue type and reason): Simulated without use of grab bars or 3 in 1 for home environment. Pt required no physical assist         Functional mobility during ADLs: Supervision/safety;Rolling walker       Vision       Perception     Praxis      Cognition Arousal/Alertness: Awake/alert Behavior During Therapy: WFL for tasks assessed/performed Overall Cognitive Status: Within Functional Limits for tasks assessed                                          Exercises     Shoulder Instructions       General Comments wife present; pt educated on activity progression     Pertinent Vitals/ Pain       Pain Assessment: No/denies pain Pain Location: discomfort in chest due to L side hematoma Pain Intervention(s): Monitored during session  Home Living                                          Prior Functioning/Environment  Frequency  Min 2X/week        Progress Toward Goals  OT Goals(current goals can now be found in the care plan section)  Progress towards OT goals: Progressing toward goals  Acute Rehab OT Goals Patient Stated Goal: To be able to walk his dog OT Goal Formulation: With patient  Plan Discharge plan remains appropriate    Co-evaluation                 AM-PAC PT "6 Clicks" Daily Activity     Outcome Measure   Help from another person eating meals?: None Help from another person taking care of personal grooming?: A Little Help from another person toileting, which includes using toliet, bedpan, or urinal?: A Little Help from another person bathing (including washing, rinsing, drying)?: A Little Help from another person to put on and taking off  regular upper body clothing?: A Little Help from another person to put on and taking off regular lower body clothing?: A Little 6 Click Score: 19    End of Session Equipment Utilized During Treatment: Rolling walker  OT Visit Diagnosis: Unsteadiness on feet (R26.81);Muscle weakness (generalized) (M62.81);Other symptoms and signs involving cognitive function   Activity Tolerance Patient tolerated treatment well   Patient Left in bed;with call bell/phone within reach;with bed alarm set   Nurse Communication Mobility status;Other (comment) (emptied 600 from urinal)        Time: 4481-8563 OT Time Calculation (min): 11 min  Charges: OT General Charges $OT Visit: 1 Procedure OT Treatments $Self Care/Home Management : 8-22 mins  Zaniya Mcaulay A. Ulice Brilliant, M.S., OTR/L Pager: [phone] 08/14/2016, 2:46 PM

## 2016-08-14 NOTE — Progress Notes (Signed)
Progress Note  Patient Name: [name] [name] Date of Encounter: 08/14/2016  Primary Cardiologist: New to [facility], Dr. Haroldine Laws  Subjective   Feels well, reports walking from one end of the floor to the other and back today, no CP or SOB.    Inpatient Medications    Scheduled Meds: . carvedilol  6.25 mg Oral BID WC  . Chlorhexidine Gluconate Cloth  6 each Topical Daily  . docusate sodium  100 mg Oral BID  . folic acid  1 mg Oral Daily  . furosemide  40 mg Oral Daily  . multivitamin with minerals  1 tablet Oral Daily  . pantoprazole  40 mg Oral BID  . pneumococcal 23 valent vaccine  0.5 mL Intramuscular Tomorrow-1000  . potassium chloride  40 mEq Oral BID  . rosuvastatin  10 mg Oral q1800  . sacubitril-valsartan  1 tablet Oral BID  . sodium chloride flush  10-40 mL Intracatheter Q12H  . sodium chloride flush  3 mL Intravenous Q12H  . spironolactone  25 mg Oral Daily  . thiamine  100 mg Oral Daily   Continuous Infusions: . sodium chloride 10 mL/hr at 08/07/16 0600  . sodium chloride    . sodium chloride    . [START ON 08/15/2016] cefUROXime (ZINACEF)  IV     PRN Meds: sodium chloride, sodium chloride, acetaminophen, fentaNYL (SUBLIMAZE) injection, ondansetron (ZOFRAN) IV, sodium chloride flush, sodium chloride flush   Vital Signs    Vitals:   08/13/16 1302 08/13/16 2005 08/14/16 0557 08/14/16 1123  BP: 137/70 131/66 (!) 147/78 99/62  Pulse: 85 87 87 89  Resp:  18 19 18   Temp:  98.6 F (37 C) 98.3 F (36.8 C) 98.4 F (36.9 C)  TempSrc:  Oral Oral Oral  SpO2:  98% 93% 98%  Weight:   138 lb 12.8 oz (63 kg)   Height:        Intake/Output Summary (Last 24 hours) at 08/14/16 1712 Last data filed at 08/14/16 1637  Gross per 24 hour  Intake              600 ml  Output             1680 ml  Net            -1080 ml   Filed Weights   08/12/16 0550 08/13/16 0423 08/14/16 0557  Weight: 141 lb 1.5 oz (64 kg) 150 lb 3.2 oz (68.1 kg) 138 lb 12.8 oz (63 kg)     Telemetry    SR, no arrhythmias- Personally Reviewed  ECG    No new EKGs - Personally Reviewed  Physical Exam   GEN: No acute distress.   Neck: No JVD Cardiac: RRR, no murmurs, rubs, or gallops.  CHEST; large left chest wall hematoma remains, extensive ecchymosis left hemithorax to hip and LUE Respiratory: Clear to auscultation bilaterally. GI: Soft, dressing in place is clean/dry, drain has been removed MS: No edema; No deformity. Neuro:  Nonfocal  Psych: Normal affect   Labs    Chemistry Recent Labs Lab 08/10/16 0745 08/11/16 0528 08/12/16 0404 08/13/16 0259  NA 139 138 136 132*  K 3.0* 3.3* 3.9 4.1  CL 108 107 107 105  CO2 23 24 21* 23  GLUCOSE 106* 108* 136* 123*  BUN 22* 14 11 9   CREATININE 0.85 0.80 0.76 0.75  CALCIUM 7.5* 7.4* 7.6* 7.5*  PROT 4.2* 4.0*  --  4.2*  ALBUMIN 2.2* 2.0*  --  2.0*  AST 58* 54*  --  44*  ALT 27 28  --  33  ALKPHOS 31* 34*  --  40  BILITOT 1.3* 1.4*  --  1.6*  GFRNONAA >60 >60 >60 >60  GFRAA >60 >60 >60 >60  ANIONGAP 8 7 8  4*     Hematology Recent Labs Lab 08/12/16 0404 08/13/16 0259 08/14/16 0303  WBC 11.8* 12.4* 13.1*  RBC 2.87* 2.78* 2.98*  HGB 9.1* 8.9* 9.3*  HCT 26.6* 26.0* 28.0*  MCV 92.7 93.5 94.0  MCH 31.7 32.0 31.2  MCHC 34.2 34.2 33.2  RDW 17.9* 17.9* 17.6*  PLT 227 245 303    Cardiac EnzymesNo results for input(s): TROPONINI in the last 168 hours. No results for input(s): TROPIPOC in the last 168 hours.   BNPNo results for input(s): BNP, PROBNP in the last 168 hours.   DDimer No results for input(s): DDIMER in the last 168 hours.   Radiology    No results found.  Cardiac Studies   Left/Right heart cath 08/13/16  Ost RCA lesion, 80 %stenosed.  Ost LM lesion, 40 %stenosed.  Ost Ramus to Ramus lesion, 80 %stenosed.  Ost LAD to Prox LAD lesion, 70 %stenosed.  Ost 1st Diag to 1st Diag lesion, 75 %stenosed.  Mid LAD to Dist LAD lesion, 40 %stenosed. Findings: Ao = 141/70 (102) LV =  126/13 RA = 5 RV = 29/8 PA = 33/9 (22) PCW = 12 Fick cardiac output/index = 5.3/3.0 PVR = 1.9 WU Ao sat = 92%  PA sat = 55%,54%   08/06/2016 TTE Study Conclusions - Left ventricle: The cavity size was normal. There was mild focal basal hypertrophy of the septum. Systolic function was moderately reduced. The estimated ejection fraction was in the range of 35% to 40%. There is akinesis of the apical myocardium. There is akinesis of the apicalanterior, lateral, inferolateral, and inferior myocardium. There is akinesis of the midanteroseptal myocardium. There was an increased relative contribution of atrial contraction to ventricular filling. Doppler parameters are consistent with abnormal left ventricular relaxation (grade 1 diastolic dysfunction). There was an apparent, medium-sized, apicalthrombus. - Pulmonary arteries: Systolic pressure could not be accurately estimated. - Pericardium, extracardiac: There was a large left pleural effusion.  Patient Profile     66 y.o. male with a hx of HTN, CAD, HLD, smoker, BPH, admitted to [facility] 08/04/16 after a syncopal event/presumed VF arrest with reports of AED shock, VF arrest  Assessment & Plan    1. Cardiac ArrestVT/VF: --Suspected primary event was VT/VF arrest in setting of known ischemic CM (MI in late 80-90. Cared for by Dr. Doreatha Lew. No f/u since. Reports cath at that time with what sounds like POBA) - not felt to have ACS by cardiology with Trop flat at 1.0. ECG ok -ECHO EF 35-40%.  - agree, Keep K. 4.0 Mg > 2.0  [employer] representative made aware of plans for hematoma evacuation, they have been by to see the patient, will need life vest fitted and placed prior to discharge EP out patient follow up has been arranged  2. CAD with ischemic CM - No CP.  - ASA on hold due to hematoma - On BB, statin, entresto, spironolactone  Cath noted ++ for triple vessel CAD, no intervention, plans for aggressive medical  therapy If develops angina can consider PCI to Ramus   3. Perforated stomach     s/p repair. Suspect due to CPR.      Drain removed today  4. Frequent PVCs, NSVT  This seems to be quieting down in the last 24 hours   5. LV Thrombus:  Has been off since 7/28 due to fall and chest wall hematoma.  Planned for hematoma evacuation tomorrow Defer a/c timing to cardiology/CTS   6. Chest wall hematoma with symptomatic anemia - Received 1PRBC on 7/29.  H/H stable last few days  7. Acute delirium Remains resolved  9. ETOH and tobacco abuse -being addressed by primary team  Signed, Baldwin Jamaica, PA-C  08/14/2016, 5:12 PM     I have seen, examined the patient, and reviewed the above assessment and plan.  On exam, he has a large L chest wall hematoma.  Plans for evacuation are noted.   Changes to above are made where necessary.  I have discussed at length with [employer] representative.  Patient should be able to wear lifevest and be appropriately defibrillated if required.   I would advise that we place lifevest at discharge and follow closely as an outpatient.  Anticipate returning in 4-6 weeks for ICD implantation if other issues are resolved.  Co Sign: Thompson Grayer, MD 08/14/2016 11:23 PM

## 2016-08-15 ENCOUNTER — Encounter (HOSPITAL_COMMUNITY)
Admission: EM | Disposition: A | Discharge status: Home Health | Payer: Self-pay | Source: Home / Self Care | Attending: Family Medicine

## 2016-08-15 ENCOUNTER — Inpatient Hospital Stay (HOSPITAL_COMMUNITY): Payer: PPO

## 2016-08-15 ENCOUNTER — Inpatient Hospital Stay (HOSPITAL_COMMUNITY): Payer: PPO | Admitting: Anesthesiology

## 2016-08-15 ENCOUNTER — Encounter (HOSPITAL_COMMUNITY): Payer: Self-pay | Admitting: Orthopedic Surgery

## 2016-08-15 DIAGNOSIS — I97621 Postprocedural hematoma of a circulatory system organ or structure following other procedure: Secondary | ICD-10-CM

## 2016-08-15 DIAGNOSIS — Z9889 Other specified postprocedural states: Secondary | ICD-10-CM

## 2016-08-15 HISTORY — PX: HEMATOMA EVACUATION: SHX5118

## 2016-08-15 LAB — BASIC METABOLIC PANEL
Anion gap: 4 — ABNORMAL LOW (ref 5–15)
BUN: 9 mg/dL (ref 6–20)
CALCIUM: 8.1 mg/dL — AB (ref 8.9–10.3)
CHLORIDE: 106 mmol/L (ref 101–111)
CO2: 23 mmol/L (ref 22–32)
CREATININE: 0.67 mg/dL (ref 0.61–1.24)
GFR calc Af Amer: >60 mL/min (ref >60)
GFR calc non Af Amer: >60 mL/min (ref >60)
GLUCOSE: 135 mg/dL — AB (ref 65–99)
Potassium: 3.9 mmol/L (ref 3.5–5.1)
Sodium: 133 mmol/L — ABNORMAL LOW (ref 135–145)

## 2016-08-15 LAB — CBC
HEMATOCRIT: 28.3 % — AB (ref 39.0–52.0)
HEMOGLOBIN: 9.5 g/dL — AB (ref 13.0–17.0)
MCH: 31.6 pg (ref 26.0–34.0)
MCHC: 33.6 g/dL (ref 30.0–36.0)
MCV: 94 fL (ref 78.0–100.0)
Platelets: 357 10*3/uL (ref 150–400)
RBC: 3.01 MIL/uL — ABNORMAL LOW (ref 4.22–5.81)
RDW: 17.1 % — AB (ref 11.5–15.5)
WBC: 10.6 10*3/uL — ABNORMAL HIGH (ref 4.0–10.5)

## 2016-08-15 LAB — MAGNESIUM: Magnesium: 1.7 mg/dL (ref 1.7–2.4)

## 2016-08-15 LAB — GLUCOSE, CAPILLARY: GLUCOSE-CAPILLARY: 110 mg/dL — AB (ref 65–99)

## 2016-08-15 SURGERY — EVACUATION HEMATOMA
Anesthesia: General | Site: Chest | Laterality: Left

## 2016-08-15 MED ORDER — FOLIC ACID 1 MG PO TABS
1.0000 mg | ORAL_TABLET | Freq: Every day | ORAL | Status: DC
  Administered 2016-08-16 – 2016-08-19 (×4): 1 mg via ORAL
  Filled 2016-08-15 (×4): qty 1

## 2016-08-15 MED ORDER — SACUBITRIL-VALSARTAN 49-51 MG PO TABS
1.0000 | ORAL_TABLET | Freq: Two times a day (BID) | ORAL | Status: DC
  Administered 2016-08-16 – 2016-08-19 (×7): 1 via ORAL
  Filled 2016-08-15 (×7): qty 1

## 2016-08-15 MED ORDER — ONDANSETRON HCL 4 MG/2ML IJ SOLN: 4.0000 mg | Freq: Four times a day (QID) | INTRAMUSCULAR | Status: DC | PRN

## 2016-08-15 MED ORDER — MAGNESIUM SULFATE 4 GM/100ML IV SOLN
4.0000 g | Freq: Once | INTRAVENOUS | Status: AC
  Administered 2016-08-15: 4 g via INTRAVENOUS
  Filled 2016-08-15: qty 100

## 2016-08-15 MED ORDER — LEVALBUTEROL HCL 0.63 MG/3ML IN NEBU
0.6300 mg | INHALATION_SOLUTION | Freq: Four times a day (QID) | RESPIRATORY_TRACT | Status: DC
  Administered 2016-08-15: 0.63 mg via RESPIRATORY_TRACT
  Filled 2016-08-15: qty 3

## 2016-08-15 MED ORDER — FENTANYL CITRATE (PF) 100 MCG/2ML IJ SOLN
INTRAMUSCULAR | Status: AC
  Filled 2016-08-15: qty 2

## 2016-08-15 MED ORDER — FENTANYL CITRATE (PF) 250 MCG/5ML IJ SOLN
INTRAMUSCULAR | Status: AC
  Filled 2016-08-15: qty 5

## 2016-08-15 MED ORDER — SENNOSIDES-DOCUSATE SODIUM 8.6-50 MG PO TABS
1.0000 | ORAL_TABLET | Freq: Every day | ORAL | Status: DC
  Administered 2016-08-16: 1 via ORAL
  Filled 2016-08-15 (×2): qty 1

## 2016-08-15 MED ORDER — PROPOFOL 10 MG/ML IV BOLUS
INTRAVENOUS | Status: AC
  Filled 2016-08-15: qty 20

## 2016-08-15 MED ORDER — 0.9 % SODIUM CHLORIDE (POUR BTL) OPTIME
TOPICAL | Status: DC | PRN
  Administered 2016-08-15 (×2): 1000 mL

## 2016-08-15 MED ORDER — ACETAMINOPHEN 160 MG/5ML PO SOLN
1000.0000 mg | Freq: Four times a day (QID) | ORAL | Status: DC
  Filled 2016-08-15 (×5): qty 40.6

## 2016-08-15 MED ORDER — PHENYLEPHRINE HCL 10 MG/ML IJ SOLN
INTRAMUSCULAR | Status: DC | PRN
  Administered 2016-08-15: 120 ug via INTRAVENOUS

## 2016-08-15 MED ORDER — POTASSIUM CHLORIDE CRYS ER 20 MEQ PO TBCR
40.0000 meq | EXTENDED_RELEASE_TABLET | Freq: Once | ORAL | Status: AC
  Administered 2016-08-15: 40 meq via ORAL

## 2016-08-15 MED ORDER — ACETAMINOPHEN 500 MG PO TABS: 1000.0000 mg | ORAL_TABLET | Freq: Four times a day (QID) | ORAL | Status: DC

## 2016-08-15 MED ORDER — TRAMADOL HCL 50 MG PO TABS
50.0000 mg | ORAL_TABLET | Freq: Four times a day (QID) | ORAL | Status: DC | PRN
  Filled 2016-08-15: qty 1

## 2016-08-15 MED ORDER — ROSUVASTATIN CALCIUM 10 MG PO TABS
10.0000 mg | ORAL_TABLET | Freq: Every day | ORAL | Status: DC
  Administered 2016-08-15 – 2016-08-18 (×4): 10 mg via ORAL
  Filled 2016-08-15 (×4): qty 1

## 2016-08-15 MED ORDER — BISACODYL 5 MG PO TBEC
10.0000 mg | DELAYED_RELEASE_TABLET | Freq: Every day | ORAL | Status: DC
  Filled 2016-08-15 (×3): qty 2

## 2016-08-15 MED ORDER — MIDAZOLAM HCL 2 MG/2ML IJ SOLN
INTRAMUSCULAR | Status: AC
  Filled 2016-08-15: qty 2

## 2016-08-15 MED ORDER — ACETAMINOPHEN 325 MG PO TABS
650.0000 mg | ORAL_TABLET | ORAL | Status: DC | PRN
  Administered 2016-08-15 – 2016-08-18 (×5): 650 mg via ORAL
  Filled 2016-08-15 (×5): qty 2

## 2016-08-15 MED ORDER — PROPOFOL 10 MG/ML IV BOLUS
INTRAVENOUS | Status: DC | PRN
  Administered 2016-08-15: 100 mg via INTRAVENOUS

## 2016-08-15 MED ORDER — LACTATED RINGERS IV SOLN
INTRAVENOUS | Status: DC
Start: 2016-08-15 — End: 2016-08-15
  Administered 2016-08-15: 11:00:00 via INTRAVENOUS

## 2016-08-15 MED ORDER — CARVEDILOL 6.25 MG PO TABS
6.2500 mg | ORAL_TABLET | Freq: Two times a day (BID) | ORAL | Status: DC
  Administered 2016-08-16 – 2016-08-19 (×7): 6.25 mg via ORAL
  Filled 2016-08-15 (×9): qty 1

## 2016-08-15 MED ORDER — PHENYLEPHRINE HCL 10 MG/ML IJ SOLN
INTRAVENOUS | Status: DC | PRN
  Administered 2016-08-15: 100 ug/min via INTRAVENOUS

## 2016-08-15 MED ORDER — FENTANYL CITRATE (PF) 100 MCG/2ML IJ SOLN
INTRAMUSCULAR | Status: DC | PRN
  Administered 2016-08-15: 150 ug via INTRAVENOUS

## 2016-08-15 MED ORDER — POTASSIUM CHLORIDE CRYS ER 20 MEQ PO TBCR: 20.0000 meq | EXTENDED_RELEASE_TABLET | Freq: Once | ORAL | Status: DC

## 2016-08-15 MED ORDER — CEFUROXIME SODIUM 1.5 G IV SOLR
1.5000 g | Freq: Two times a day (BID) | INTRAVENOUS | Status: AC
  Administered 2016-08-15 – 2016-08-17 (×4): 1.5 g via INTRAVENOUS
  Filled 2016-08-15 (×5): qty 1.5

## 2016-08-15 MED ORDER — LACTATED RINGERS IV SOLN
INTRAVENOUS | Status: DC | PRN
  Administered 2016-08-15: 10:00:00 via INTRAVENOUS

## 2016-08-15 MED ORDER — SUGAMMADEX SODIUM 200 MG/2ML IV SOLN
INTRAVENOUS | Status: DC | PRN
  Administered 2016-08-15: 200 mg via INTRAVENOUS

## 2016-08-15 MED ORDER — OXYCODONE HCL 5 MG PO TABS: 5.0000 mg | ORAL_TABLET | ORAL | Status: DC | PRN

## 2016-08-15 MED ORDER — LIDOCAINE HCL (CARDIAC) 20 MG/ML IV SOLN
INTRAVENOUS | Status: DC | PRN
  Administered 2016-08-15: 60 mg via INTRAVENOUS

## 2016-08-15 MED ORDER — POTASSIUM CHLORIDE 10 MEQ/50ML IV SOLN
10.0000 meq | Freq: Every day | INTRAVENOUS | Status: DC | PRN
  Filled 2016-08-15: qty 50

## 2016-08-15 MED ORDER — DEXTROSE-NACL 5-0.45 % IV SOLN
INTRAVENOUS | Status: DC
  Administered 2016-08-16: 01:00:00 via INTRAVENOUS

## 2016-08-15 MED ORDER — ONDANSETRON HCL 4 MG/2ML IJ SOLN
INTRAMUSCULAR | Status: DC | PRN
  Administered 2016-08-15: 4 mg via INTRAVENOUS

## 2016-08-15 MED ORDER — MIDAZOLAM HCL 5 MG/5ML IJ SOLN
INTRAMUSCULAR | Status: DC | PRN
  Administered 2016-08-15: 2 mg via INTRAVENOUS

## 2016-08-15 MED ORDER — TRAMADOL HCL 50 MG PO TABS: 50.0000 mg | ORAL_TABLET | Freq: Four times a day (QID) | ORAL | Status: DC | PRN

## 2016-08-15 MED ORDER — INSULIN ASPART 100 UNIT/ML ~~LOC~~ SOLN
0.0000 [IU] | SUBCUTANEOUS | Status: DC
Start: 2016-08-15 — End: 2016-08-15

## 2016-08-15 MED ORDER — ROCURONIUM BROMIDE 100 MG/10ML IV SOLN
INTRAVENOUS | Status: DC | PRN
  Administered 2016-08-15: 40 mg via INTRAVENOUS

## 2016-08-15 MED ORDER — DEXAMETHASONE SODIUM PHOSPHATE 10 MG/ML IJ SOLN
INTRAMUSCULAR | Status: DC | PRN
  Administered 2016-08-15: 10 mg via INTRAVENOUS

## 2016-08-15 MED ORDER — FENTANYL CITRATE (PF) 100 MCG/2ML IJ SOLN
25.0000 ug | INTRAMUSCULAR | Status: DC | PRN
  Administered 2016-08-15: 50 ug via INTRAVENOUS

## 2016-08-15 MED ORDER — EPHEDRINE SULFATE 50 MG/ML IJ SOLN
INTRAMUSCULAR | Status: DC | PRN
  Administered 2016-08-15: 5 mg via INTRAVENOUS
  Administered 2016-08-15: 10 mg via INTRAVENOUS

## 2016-08-15 SURGICAL SUPPLY — 40 items
ADH SKN CLS APL DERMABOND .7 (GAUZE/BANDAGES/DRESSINGS) ×1
CANISTER SUCT 3000ML PPV (MISCELLANEOUS) ×3 IMPLANT
COVER SURGICAL LIGHT HANDLE (MISCELLANEOUS) ×4 IMPLANT
DERMABOND ADVANCED (GAUZE/BANDAGES/DRESSINGS) ×2
DERMABOND ADVANCED .7 DNX12 (GAUZE/BANDAGES/DRESSINGS) IMPLANT
DRAIN CHANNEL 28F RND 3/8 FF (WOUND CARE) ×2 IMPLANT
DRSG AQUACEL AG ADV 3.5X14 (GAUZE/BANDAGES/DRESSINGS) ×1 IMPLANT
DRSG OPSITE 6X11 MED (GAUZE/BANDAGES/DRESSINGS) ×2 IMPLANT
DRSG PAD ABDOMINAL 8X10 ST (GAUZE/BANDAGES/DRESSINGS) ×3 IMPLANT
ELECT REM PT RETURN 9FT ADLT (ELECTROSURGICAL) ×3
ELECTRODE REM PT RTRN 9FT ADLT (ELECTROSURGICAL) ×1 IMPLANT
GAUZE SPONGE 4X4 12PLY STRL (GAUZE/BANDAGES/DRESSINGS) ×1 IMPLANT
GAUZE SPONGE 4X4 12PLY STRL LF (GAUZE/BANDAGES/DRESSINGS) ×4 IMPLANT
GLOVE BIO SURGEON STRL SZ7.5 (GLOVE) ×2 IMPLANT
GLOVE BIOGEL PI IND STRL 6.5 (GLOVE) IMPLANT
GLOVE BIOGEL PI INDICATOR 6.5 (GLOVE) ×2
GLOVE SURG SIGNA 7.5 PF LTX (GLOVE) ×1 IMPLANT
GLOVE SURG SS PI 6.0 STRL IVOR (GLOVE) ×4 IMPLANT
GLOVE SURG SS PI 6.5 STRL IVOR (GLOVE) ×2 IMPLANT
GOWN BRE IMP PREV XXLGXLNG (GOWN DISPOSABLE) ×1 IMPLANT
GOWN STRL REUS W/ TWL LRG LVL3 (GOWN DISPOSABLE) ×2 IMPLANT
GOWN STRL REUS W/TWL LRG LVL3 (GOWN DISPOSABLE) ×9
KIT BASIN OR (CUSTOM PROCEDURE TRAY) ×3 IMPLANT
KIT ROOM TURNOVER OR (KITS) ×3 IMPLANT
NS IRRIG 1000ML POUR BTL (IV SOLUTION) ×3 IMPLANT
PACK GENERAL/GYN (CUSTOM PROCEDURE TRAY) ×3 IMPLANT
PAD ABD 8X10 STRL (GAUZE/BANDAGES/DRESSINGS) ×2 IMPLANT
PAD ARMBOARD 7.5X6 YLW CONV (MISCELLANEOUS) ×6 IMPLANT
SPONGE LAP 18X18 X RAY DECT (DISPOSABLE) ×2 IMPLANT
SUT SILK 2 0 SH (SUTURE) ×2 IMPLANT
SUT VIC AB 1 CTX 18 (SUTURE) ×2 IMPLANT
SUT VIC AB 2-0 CT1 27 (SUTURE) ×3
SUT VIC AB 2-0 CT1 TAPERPNT 27 (SUTURE) IMPLANT
SUT VIC AB 3-0 X1 27 (SUTURE) ×2 IMPLANT
SWAB COLLECTION DEVICE MRSA (MISCELLANEOUS) ×3 IMPLANT
SWAB CULTURE ESWAB REG 1ML (MISCELLANEOUS) ×5 IMPLANT
TAPE CLOTH SURG 6X10 WHT LF (GAUZE/BANDAGES/DRESSINGS) ×4 IMPLANT
TOWEL OR 17X24 6PK STRL BLUE (TOWEL DISPOSABLE) ×3 IMPLANT
TOWEL OR 17X26 10 PK STRL BLUE (TOWEL DISPOSABLE) ×3 IMPLANT
WATER STERILE IRR 1000ML POUR (IV SOLUTION) ×3 IMPLANT

## 2016-08-15 NOTE — Anesthesia Preprocedure Evaluation (Addendum)
Anesthesia Evaluation  Patient identified by MRN, date of birth, ID band Patient awake    Reviewed: Allergy & Precautions, NPO status , Patient's Chart, lab work & pertinent test results  Airway Mallampati: II  TM Distance: <3 FB     Dental  (+) Teeth Intact, Dental Advidsory Given   Pulmonary Current Smoker,    breath sounds clear to auscultation       Cardiovascular hypertension, Pt. on medications + CAD   Rhythm:Regular Rate:Normal     Neuro/Psych    GI/Hepatic PUD,   Endo/Other    Renal/GU      Musculoskeletal   Abdominal   Peds  Hematology   Anesthesia Other Findings History reviewed.  Anesthetic plan discussed with anesthesiologist  Reproductive/Obstetrics                            Anesthesia Physical Anesthesia Plan  ASA: III  Anesthesia Plan: General   Post-op Pain Management:    Induction: Intravenous  PONV Risk Score and Plan: 1 and Ondansetron and Dexamethasone  Airway Management Planned: Oral ETT  Additional Equipment:   Intra-op Plan:   Post-operative Plan: Extubation in OR  Informed Consent: I have reviewed the patients History and Physical, chart, labs and discussed the procedure including the risks, benefits and alternatives for the proposed anesthesia with the patient or authorized representative who has indicated his/her understanding and acceptance.   Dental Advisory Given  Plan Discussed with: Anesthesiologist, CRNA and Surgeon  Anesthesia Plan Comments:        Anesthesia Quick Evaluation

## 2016-08-15 NOTE — Progress Notes (Signed)
The patient was examined and preop studies reviewed. There has been no change from the prior exam and the patient is ready for surgery.  PLAN EVACUATION OF LEFT CHEST WALL HEMATOMA

## 2016-08-15 NOTE — Progress Notes (Signed)
1Q197 pt with itchy/'dermatitis' to entire back and neck. curious that lower/oblique bumps are not blanching with hematoma in same spot, remain fleshtoned

## 2016-08-15 NOTE — Progress Notes (Addendum)
PROGRESS NOTE  [name] [name] ZES:[id number] DOB: 1950-09-05 DOA: 08/04/2016 PCP: Patient, No Pcp Per  Brief summary:  Out of hospital Cardiac arrest, s/p cpr/defib, found to have  gi perforation s/p repair, rib fracture with large chest wall hematoma,t8 vertebral fracture, thought all from cpr, has LV thrombus, chf, delirium General surgery, cardiology, EP, thoracic surgery consulted Cardiac cath planned on 8/1,  CIR placement vs home health at discharge  HPI/Recap of past 24 hours: Pt ready to have chest hematoma drained today. Reports no new complaints.  Assessment/Plan: Active Problems:   Pneumoperitoneum   Syncope and collapse   Acute respiratory failure (HCC)   Frequent PVCs   Cardiomyopathy, ischemic   Chest wall trauma   Perforated gastric ulcer (HCC)   Tachycardia   Hypotension due to drugs   Benign essential HTN   Labile blood pressure   Pleural effusion   Hematoma of chest wall   S/P evacuation of hematoma   Cardiac arrest (presenting symptom):  - Out of hospital VF/VT arrest suspected, witness - unclear etiology. Required 1 shock and 2 - 3 minutes CPR before ROSC. 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 - He was intubated and admitted to icu,  - He has improved , extubated and transferred to hospitalist service on 7/27 - Pt fitted for life vest. Needs chest hematoma drained. Plan is for OR today.  LV thrombus:  heparin drip held on 7/28 due to expanding large left chest wall hematoma - Hemoglobin stable and on last check a 9.3   Systolic CHF  -Echo on 2/63 lvef 35-40% with grade 1 diastolic dysfunction -Ct chest on 7/28 "Moderate bilateral pleural effusions are noted with adjacent subsegmental atelectasis of both lower lobes. -cardiology /heart failure team consulted, currently he is on lasix/spironolactone, coreg, entresto   Hypokalemia/hypomagnesemia:  -  WNL on last check. Magnesium at 1.7 Magnesium and potassium goals outlined in cardiology note secondary to. Will replace potassium orally and magnesium will be replaced as well. We'll recheck levels next a.m.  PERFORATED STOMACH ULCER vs perforation from CPR -peunoperitoneum on initial ct scan, underwent emergency exploratory laparotomy  EXPLORATORY LAPAROTOMY FREE AIR BIOPSY OF PERFORATED STOMACH ULCER REPAIR OF PERFORATED STOMACH ULCER WITH A OMENTAL PATCH (N/A) on 7/23 - He was on iv ppi, now on oral ppi - He is started back on heart healthy diet - He was on zosyn then unasyn since admission, day 9 abx as of 7/31  Large chest wall hematoma, rib fracture, acute blood loss anemia ( RN reported a fall on 7/27, however chest wall hematoma started prior to the suspected fall)  -Thoracic surgery Dr Lucianne Lei trigt consulted, conservation management, pressure dressing,   -s/p prbc transfusion/ffp, hold heparin drip - Plan is for drainage today. Post op care per surgeon.  Stable probable nondisplaced fracture involving T8 vertebral body. He denies back pain.  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 improving  Code Status: full  Family Communication: Discussed with patient and family member at bedside  Disposition Plan:  Undergoing LifeVest placement/fitting  Consultants:  pccm admit, transferred to [facility] on 7/27  General surgery  cardiology /heart failure team  Thoracic surgery Dr Prescott Gum  Procedures:  Intubation/extubation  EXPLORATORY LAPAROTOMY FREE AIR BIOPSY OF PERFORATED STOMACH ULCER REPAIR OF PERFORATED STOMACH ULCER WITH A OMENTAL PATCH (N/A)  prbc /ffp transfusion  Left IJ  placement and removal   .    Antibiotics:  Zosyn then unasyn  eraxis then diflucan (last dose on 7/29)   Objective: BP 107/75 (BP Location: Right Wrist)   Pulse 82   Temp 98 F (36.7 C) (Oral)   Resp 18   Ht 5\' 8"  (1.727 m)    Wt 61.5 kg (135 lb 8 oz)   SpO2 96%   BMI 20.60 kg/m   Intake/Output Summary (Last 24 hours) at 08/15/16 1646 Last data filed at 08/15/16 1400  Gross per 24 hour  Intake          2505.21 ml  Output              945 ml  Net          1560.21 ml   Filed Weights   08/14/16 0557 08/15/16 0519 08/15/16 1045  Weight: 63 kg (138 lb 12.8 oz) 61.5 kg (135 lb 8 oz) 61.5 kg (135 lb 8 oz)    Exam:Exam unchanged when compared to 08/14/2016   General:   Awake and alert, no acute distress  Cardiovascular: RRR with ectopic beats, no gallops or rubs   Respiratory: diminished at basis, no wheezing, no rhonchi  Abdomen: potst op changes, Left chest wall elevated, soft, nondistended,  positive BS  Musculoskeletal: No lower extremity Edema, left arm edema is improving  Neuro: Awake and alert, no facial asymmetry, oriented to year/place/person  Data Reviewed: Basic Metabolic Panel:  Recent Labs Lab 08/10/16 0745 08/11/16 0528 08/12/16 0404 08/13/16 0259 08/14/16 1107 08/15/16 0445  NA 139 138 136 132*  --  133*  K 3.0* 3.3* 3.9 4.1  --  3.9  CL 108 107 107 105  --  106  CO2 23 24 21* 23  --  23  GLUCOSE 106* 108* 136* 123*  --  135*  BUN 22* 14 11 9   --  9  CREATININE 0.85 0.80 0.76 0.75  --  0.67  CALCIUM 7.5* 7.4* 7.6* 7.5*  --  8.1*  MG 1.8 1.6* 1.7 2.1 1.9 1.7   Liver Function Tests:  Recent Labs Lab 08/10/16 0745 08/11/16 0528 08/13/16 0259  AST 58* 54* 44*  ALT 27 28 33  ALKPHOS 31* 34* 40  BILITOT 1.3* 1.4* 1.6*  PROT 4.2* 4.0* 4.2*  ALBUMIN 2.2* 2.0* 2.0*   No results for input(s): LIPASE, AMYLASE in the last 168 hours. No results for input(s): AMMONIA in the last 168 hours. CBC:  Recent Labs Lab 08/11/16 0528 08/12/16 0404 08/13/16 0259 08/14/16 0303 08/15/16 0445  WBC 9.4 11.8* 12.4* 13.1* 10.6*  HGB 8.5* 9.1* 8.9* 9.3* 9.5*  HCT 25.0* 26.6* 26.0* 28.0* 28.3*  MCV 92.3 92.7 93.5 94.0 94.0  PLT 192 227 245 303 357   Cardiac Enzymes:   No results  for input(s): CKTOTAL, CKMB, CKMBINDEX, TROPONINI in the last 168 hours. BNP (last 3 results) No results for input(s): BNP in the last 8760 hours.  ProBNP (last 3 results) No results for input(s): PROBNP in the last 8760 hours.  CBG:  Recent Labs Lab 08/09/16 0730 08/09/16 1125 08/09/16 1632 08/09/16 2114 08/15/16 1258  GLUCAP 147* 128* 136* 127* 110*    Recent Results (from the past 240 hour(s))  Aerobic/Anaerobic Culture (surgical/deep wound)     Status: None (Preliminary result)   Collection Time: 08/15/16 11:57 AM  Result Value Ref Range Status   Specimen Description WOUND CHEST LEFT  Final   Special Requests PATIENT ON FOLLOWING ZINACEF  Final  Gram Stain   Final    RARE WBC PRESENT, PREDOMINANTLY PMN NO ORGANISMS SEEN    Culture PENDING  Incomplete   Report Status PENDING  Incomplete     Studies: Dg Chest Port 1 View  Result Date: 08/15/2016 CLINICAL DATA:  Status post trabeculation of a left chest wall hematoma. EXAM: PORTABLE CHEST 1 VIEW COMPARISON:  Chest CT dated 08/08/2016 and portable chest dated 08/08/2016. FINDINGS: Stable enlarged cardiac silhouette. Increased dense opacity in the left lower lung zone and interval ill-defined opacity in the left perihilar region. Interval mild right basilar ill-defined opacity. Decreased inspiration. Tubing overlying the left chest. Thoracic spine degenerative changes. IMPRESSION: 1. Increased dense atelectasis and possible pneumonia in the left lower lung zone with interval left perihilar pneumonia or patchy atelectasis. 2. Interval mild right basilar atelectasis or pneumonia. Electronically Signed   By: Claudie Revering M.D.   On: 08/15/2016 13:32    Scheduled Meds: . acetaminophen  1,000 mg Oral Q6H   Or  . acetaminophen (TYLENOL) oral liquid 160 mg/5 mL  1,000 mg Oral Q6H  . bisacodyl  10 mg Oral Daily  . carvedilol  6.25 mg Oral BID WC  . fentaNYL      . [START ON 08/21/3732] folic acid  1 mg Oral Daily  . levalbuterol   0.63 mg Nebulization Q6H  . pneumococcal 23 valent vaccine  0.5 mL Intramuscular Tomorrow-1000  . rosuvastatin  10 mg Oral q1800  . [START ON 08/16/2016] sacubitril-valsartan  1 tablet Oral BID  . senna-docusate  1 tablet Oral QHS    Continuous Infusions: . cefUROXime (ZINACEF)  IV    . dextrose 5 % and 0.45% NaCl    . magnesium sulfate 1 - 4 g bolus IVPB 4 g (08/15/16 1622)  . potassium chloride      Time spent: > 35 mins  Velvet Bathe MD, PhD  [facility] Pager [phone] If 7PM-7AM, please contact night-coverage at [email or web address], password [facility] 08/15/2016, 4:46 PM  LOS: 11 days

## 2016-08-15 NOTE — Progress Notes (Addendum)
Page to CHF pager, to request review of pt's med orders, as they appear quite skewed and contradicting to Dr Sharmaine Base note.   Due to time (after 4pm) no response received.   Will get alternative provider to review.

## 2016-08-15 NOTE — Progress Notes (Signed)
Pt returns from PACU/OR.

## 2016-08-15 NOTE — Progress Notes (Signed)
  Speech Language Pathology Treatment: Cognitive-Linquistic  Patient Details Name: [name] [name] MRN: [id number] DOB: 08/02/1950 Today's Date: 08/15/2016 Time: 3614-4315 SLP Time Calculation (min) (ACUTE ONLY): 12 min  Assessment / Plan / Recommendation Clinical Impression  Pt continues to demonstrate improved cognition - short term and working memory have improved.  Pt demonstrated verbal recall of information from lunch/dinner menu independently after ten minutes.  Demonstrated adequate alternating attention and excellent recall of task instructions.  No further SLP f/u needed for cognition - our services will sign off.     HPI HPI: 66 y.o.maleadmitted 7/23 after collapsed at [facility] and suffered VF/VT arrest. Required 1 shock and 2 - 3 minutes CPR before ROSC. Brought to ED where he was intubated and incidentally found to have massive pneumoperitoneum. ETT 7/23-25. Ex lap 7/23. Persisting acute encephalopathy;  cognitive assessment ordered.  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       SLP Plan  All goals met       Recommendations                   Follow up Recommendations: None SLP Visit Diagnosis: Cognitive communication deficit (Q00.867) Plan: All goals met       GO                Juan Quam Laurice 08/15/2016, 10:17 AM  Estill Bamberg L. Tivis Ringer, [place] CCC/SLP Pager [phone]

## 2016-08-15 NOTE — Progress Notes (Signed)
Progress Note  Patient Name: [name] [name] Date of Encounter: 08/15/2016  Primary Cardiologist: New  Subjective   Stable from HF perspective. Entresto increased yesterday. No CP or SOB. Rhythm stable   Left/Right heart cath 08/13/16  Ost RCA lesion, 80 %stenosed.  Ost LM lesion, 40 %stenosed.  Ost Ramus to Ramus lesion, 80 %stenosed.  Ost LAD to Prox LAD lesion, 70 %stenosed.  Ost 1st Diag to 1st Diag lesion, 75 %stenosed.  Mid LAD to Dist LAD lesion, 40 %stenosed.   Findings:  Ao = 141/70 (102) LV =  126/13 RA =  5 RV = 29/8 PA = 33/9 (22) PCW = 12 Fick cardiac output/index = 5.3/3.0 PVR = 1.9 WU Ao sat = 92%  PA sat = 55%,54%   Inpatient Medications    Scheduled Meds: . carvedilol  6.25 mg Oral BID WC  . Chlorhexidine Gluconate Cloth  6 each Topical Daily  . docusate sodium  100 mg Oral BID  . folic acid  1 mg Oral Daily  . furosemide  40 mg Oral Daily  . multivitamin with minerals  1 tablet Oral Daily  . pantoprazole  40 mg Oral BID  . pneumococcal 23 valent vaccine  0.5 mL Intramuscular Tomorrow-1000  . potassium chloride  40 mEq Oral BID  . rosuvastatin  10 mg Oral q1800  . sacubitril-valsartan  1 tablet Oral BID  . sodium chloride flush  10-40 mL Intracatheter Q12H  . sodium chloride flush  3 mL Intravenous Q12H  . spironolactone  25 mg Oral Daily  . thiamine  100 mg Oral Daily   Continuous Infusions: . sodium chloride 10 mL/hr at 08/07/16 0600  . sodium chloride    . sodium chloride    . cefUROXime (ZINACEF)  IV     PRN Meds: sodium chloride, sodium chloride, acetaminophen, fentaNYL (SUBLIMAZE) injection, ondansetron (ZOFRAN) IV, sodium chloride flush, sodium chloride flush   Vital Signs    Vitals:   08/14/16 0557 08/14/16 1123 08/14/16 2103 08/15/16 0519  BP: (!) 147/78 99/62 (!) 139/57 130/63  Pulse: 87 89 81 84  Resp: 19 18 17 18   Temp: 98.3 F (36.8 C) 98.4 F (36.9 C) 98.4 F (36.9 C) 98.1 F (36.7 C)  TempSrc: Oral  Oral Oral Oral  SpO2: 93% 98% 97% 94%  Weight: 63 kg (138 lb 12.8 oz)   61.5 kg (135 lb 8 oz)  Height:        Intake/Output Summary (Last 24 hours) at 08/15/16 0801 Last data filed at 08/15/16 0600  Gross per 24 hour  Intake              960 ml  Output             1325 ml  Net             -365 ml   Filed Weights   08/13/16 0423 08/14/16 0557 08/15/16 0519  Weight: 68.1 kg (150 lb 3.2 oz) 63 kg (138 lb 12.8 oz) 61.5 kg (135 lb 8 oz)    Telemetry   NSR 80 - personally reviewed.    Physical Exam   General: NAD, sitting in chair. No resp difficulty HEENT: normal anicteric Neck: supple. JVP flat  cm. Carotids 2+ bilat; no bruits. No lymphadenopathy or thryomegaly appreciated. Cor: PMI nondisplaced. Regular rate and rhythm. Large chest wall hematoma on left  Lungs: clear  Abdomen:  sof NT/ND . Dressing intact. + good bowel sounds, incisions healing.  Extremities: no  cyanosis, clubbing, rash, edema LUE hematoma improving Neuro: alert & oriented x 3, cranial nerves grossly intact. moves all 4 extremities w/o difficulty. Affect pleasant Skin: L flank /LUE ecchymotic    Labs    Chemistry  Recent Labs Lab 08/10/16 0745 08/11/16 0528 08/12/16 0404 08/13/16 0259 08/15/16 0445  NA 139 138 136 132* 133*  K 3.0* 3.3* 3.9 4.1 3.9  CL 108 107 107 105 106  CO2 23 24 21* 23 23  GLUCOSE 106* 108* 136* 123* 135*  BUN 22* 14 11 9 9   CREATININE 0.85 0.80 0.76 0.75 0.67  CALCIUM 7.5* 7.4* 7.6* 7.5* 8.1*  PROT 4.2* 4.0*  --  4.2*  --   ALBUMIN 2.2* 2.0*  --  2.0*  --   AST 58* 54*  --  44*  --   ALT 27 28  --  33  --   ALKPHOS 31* 34*  --  40  --   BILITOT 1.3* 1.4*  --  1.6*  --   GFRNONAA >60 >60 >60 >60 >60  GFRAA >60 >60 >60 >60 >60  ANIONGAP 8 7 8  4* 4*     Hematology  Recent Labs Lab 08/13/16 0259 08/14/16 0303 08/15/16 0445  WBC 12.4* 13.1* 10.6*  RBC 2.78* 2.98* 3.01*  HGB 8.9* 9.3* 9.5*  HCT 26.0* 28.0* 28.3*  MCV 93.5 94.0 94.0  MCH 32.0 31.2 31.6  MCHC  34.2 33.2 33.6  RDW 17.9* 17.6* 17.1*  PLT 245 303 357    Cardiac Enzymes No results for input(s): TROPONINI in the last 168 hours.  No results for input(s): TROPIPOC in the last 168 hours.    Radiology    No results found.  Cardiac Studies   Transthoracic Echocardiogram  08/06/2016  Study Conclusions  - Left ventricle: The cavity size was normal. There was mild focal   basal hypertrophy of the septum. Systolic function was moderately   reduced. The estimated ejection fraction was in the range of 35%   to 40%. There is akinesis of the apical myocardium. There is   akinesis of the apicalanterior, lateral, inferolateral, and   inferior myocardium. There is akinesis of the midanteroseptal   myocardium. There was an increased relative contribution of   atrial contraction to ventricular filling. Doppler parameters are   consistent with abnormal left ventricular relaxation (grade 1   diastolic dysfunction). There was an apparent, medium-sized,   apicalthrombus. - Pulmonary arteries: Systolic pressure could not be accurately   estimated. - Pericardium, extracardiac: There was a large left pleural   effusion.   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 CT chest also showed atherosclerosis and mildly displaced anterior left second rib fracture as well as stable probable nondisplaced fracture involving T8 vertebral body. Echo with EF 35-40% with LV thrombus  Developed large chet wall hematoma on 7/27 in setting of AC and fall.   Assessment & Plan     1. Cardiac ArrestVT/VF:  --Suspect primary event was VT/VF arrest in setting of known ischemic CM (MI in late 80-90. Cared for by Dr. Doreatha Lew. No f/u since. Reports cath at that time with what sounds like POBA) - suspect perforated stomach and rib fx sequale from CPR -ECHO EF 35-40%. - Keep K  .4.0 Mg > 2.0, BMET pending.  - For evacuation of chest wall hematoma today. Once hematome evacuated and stable  can go home with Life Vest. Eventual ICD placement in 4-6 weeks per EP. Appreciate their support   2. CAD with ischemic CM - Cath this admit with 3V disease (mostly non-obstructive)  medical therapy. If develops angina can consider PCI to Ramus.  - No s/s ischemia - Volume status stable.  - Continue b-blocker and statin and ASA.  - On low dose entresto and carvedilol - Continue spiro to 25 mg daily.  Renal function stable.     3. Perforated stomach - s/p repair. Suspect due to CPR. Cannot exclude PUD though recent EGD ok.  - No change. Tolerating diet.   4. Frequent PVCs - Much improved with b-blocker.  - Continue Coreg 6.25 mg BID.  - Keep potassium > 4 and Magnesium > 2, BMET and Mg pending.   5. LV Thrombus:  - Has been off since 7/28 due to fall and chest wall hematoma.   - SCDs for DVT prophylaxis - No change.   6. Chest wall hematoma with symptomatic anemia -s/p 1 unit PRBC's on 7/29 - Hbg 9.3 this am.   7. Hypokalemia/hypomagnesemia:  - As above, BMET pending.   8. Acute delirium - resolved.   9. ETOH and tobacco abuse - has mild cirrhosis on previous CT.  - has cut back ETOH but hasn't quit - Counseled on need for cessation -  Watch for withdrawal. On folate and thiamine - No change.   10. Suspected Fall 08/08/16-  - left arm swollen with mildly decreased ROM> Xray L humerus --no fracture.  -Elevate LUE.  - Improved.    Glori Bickers, MD  08/15/2016, 8:01 AM

## 2016-08-15 NOTE — Progress Notes (Signed)
CM following for DCP; Life Vest at discharge, to be fitted for Vest on Monday; New Entresto; to surgery today for I&D of chest wall hematoma; B Pennie Rushing [phone]

## 2016-08-15 NOTE — Brief Op Note (Signed)
08/04/2016 - 08/15/2016  12:34 PM  PATIENT:  [name] [name]  66 y.o. male  PRE-OPERATIVE DIAGNOSIS:  LEFT CHEST WALL HEMATOMA  POST-OPERATIVE DIAGNOSIS:  LEFT CHEST WALL HEMATOMA  PROCEDURE:  Procedure(s): INSERTION OF DRAIN ANTERIOR LEFT CHEST WALL HEMATOMA (Left)  SURGEON:  Surgeon(s) and Role:    Ivin Poot, MD - Primary  PHYSICIAN ASSISTANT:  Nicholes Rough, PA-C   ANESTHESIA:   general  EBL:  Total I/O In: 1725.2 [I.V.:1675.2; IV Piggyback:50] Out: 200 [Other:200]  BLOOD ADMINISTERED:none  DRAINS: ONE BLAKE DRAIN IN THE LEFT CHEST WALL   LOCAL MEDICATIONS USED:  NONE  SPECIMEN:  Source of Specimen:  CLOT OF LEFT CHEST WALL  DISPOSITION OF SPECIMEN:  CULTURE  COUNTS:  YES  TOURNIQUET:  * No tourniquets in log *  DICTATION: .Dragon Dictation  PLAN OF CARE: Admit to inpatient   PATIENT DISPOSITION:  PACU - hemodynamically stable.   Delay start of Pharmacological VTE agent (>24hrs) due to surgical blood loss or risk of bleeding: yes

## 2016-08-15 NOTE — Transfer of Care (Signed)
Immediate Anesthesia Transfer of Care Note  Patient: [name] [name]  Procedure(s) Performed: Procedure(s): INSERTION OF DRAIN ANTERIOR LEFT CHEST WALL HEMATOMA (Left)  Patient Location: PACU  Anesthesia Type:General  Level of Consciousness: awake, alert  and oriented  Airway & Oxygen Therapy: Patient Spontanous Breathing and Patient connected to face mask oxygen  Post-op Assessment: Report given to RN, Post -op Vital signs reviewed and stable and Patient moving all extremities X 4  Post vital signs: Reviewed and stable  Last Vitals:  Vitals:   08/15/16 0519 08/15/16 0850  BP: 130/63 129/68  Pulse: 84 79  Resp: 18   Temp: 36.7 C     Last Pain:  Vitals:   08/15/16 0519  TempSrc: Oral  PainSc:          Complications: No apparent anesthesia complications

## 2016-08-15 NOTE — Anesthesia Postprocedure Evaluation (Signed)
Anesthesia Post Note  Patient: [name] [name]  Procedure(s) Performed: Procedure(s) (LRB): INSERTION OF DRAIN ANTERIOR LEFT CHEST WALL HEMATOMA (Left)     Patient location during evaluation: PACU Anesthesia Type: General Level of consciousness: awake and alert Pain management: pain level controlled Vital Signs Assessment: post-procedure vital signs reviewed and stable Respiratory status: spontaneous breathing, nonlabored ventilation, respiratory function stable and patient connected to nasal cannula oxygen Cardiovascular status: blood pressure returned to baseline and stable Postop Assessment: no signs of nausea or vomiting Anesthetic complications: no    Last Vitals:  Vitals:   08/15/16 1330 08/15/16 1402  BP: 120/69 107/75  Pulse: 85 82  Resp: 14 18  Temp: 36.6 C 36.7 C    Last Pain:  Vitals:   08/15/16 1402  TempSrc: Oral  PainSc:                  Tiajuana Amass

## 2016-08-15 NOTE — Anesthesia Procedure Notes (Signed)
Procedure Name: Intubation Date/Time: 08/15/2016 11:35 AM Performed by: Neldon Newport Pre-anesthesia Checklist: Timeout performed, Patient being monitored, Suction available, Emergency Drugs available and Patient identified Patient Re-evaluated:Patient Re-evaluated prior to induction Oxygen Delivery Method: Circle system utilized Preoxygenation: Pre-oxygenation with 100% oxygen Induction Type: IV induction Ventilation: Mask ventilation without difficulty Laryngoscope Size: Mac and 3 Grade View: Grade I Tube type: Oral Tube size: 7.5 mm Number of attempts: 1 Placement Confirmation: breath sounds checked- equal and bilateral,  positive ETCO2 and ETT inserted through vocal cords under direct vision Secured at: 22 cm Tube secured with: Tape Dental Injury: Teeth and Oropharynx as per pre-operative assessment

## 2016-08-15 NOTE — Progress Notes (Signed)
Page sent to number listed for [facility] Night APP, to request order review, post-transfer back to the floor.   Gae Bon with cardiology reviewed pts orders, agrees to hold entresto until tomorrow for low BP, ok to go forth with coreg this evening.

## 2016-08-16 ENCOUNTER — Encounter (HOSPITAL_COMMUNITY): Payer: Self-pay | Admitting: Cardiothoracic Surgery

## 2016-08-16 DIAGNOSIS — S20212S Contusion of left front wall of thorax, sequela: Secondary | ICD-10-CM

## 2016-08-16 LAB — BLOOD GAS, ARTERIAL
Acid-base deficit: 2.4 mmol/L — ABNORMAL HIGH (ref 0.0–2.0)
Bicarbonate: 21.2 mmol/L (ref 20.0–28.0)
DRAWN BY: 330991
FIO2: 21
O2 SAT: 93.9 %
PCO2 ART: 30.8 mmHg — AB (ref 32.0–48.0)
PH ART: 7.449 (ref 7.350–7.450)
Patient temperature: 97.5
pO2, Arterial: 67.8 mmHg — ABNORMAL LOW (ref 83.0–108.0)

## 2016-08-16 LAB — BASIC METABOLIC PANEL
ANION GAP: 4 — AB (ref 5–15)
Anion gap: 5 (ref 5–15)
BUN: 13 mg/dL (ref 6–20)
BUN: 11 mg/dL (ref 6–20)
CHLORIDE: 105 mmol/L (ref 101–111)
CHLORIDE: 106 mmol/L (ref 101–111)
CO2: 23 mmol/L (ref 22–32)
CO2: 23 mmol/L (ref 22–32)
CREATININE: 0.82 mg/dL (ref 0.61–1.24)
Calcium: 8.1 mg/dL — ABNORMAL LOW (ref 8.9–10.3)
Calcium: 8 mg/dL — ABNORMAL LOW (ref 8.9–10.3)
Creatinine, Ser: 0.81 mg/dL (ref 0.61–1.24)
GFR calc non Af Amer: >60 mL/min (ref >60)
GFR calc non Af Amer: >60 mL/min (ref >60)
Glucose, Bld: 171 mg/dL — ABNORMAL HIGH (ref 65–99)
Glucose, Bld: 202 mg/dL — ABNORMAL HIGH (ref 65–99)
POTASSIUM: 5.1 mmol/L (ref 3.5–5.1)
Potassium: 4.3 mmol/L (ref 3.5–5.1)
SODIUM: 132 mmol/L — AB (ref 135–145)
SODIUM: 134 mmol/L — AB (ref 135–145)

## 2016-08-16 LAB — CBC
HEMATOCRIT: 27.7 % — AB (ref 39.0–52.0)
HEMOGLOBIN: 9 g/dL — AB (ref 13.0–17.0)
MCH: 30.6 pg (ref 26.0–34.0)
MCHC: 32.5 g/dL (ref 30.0–36.0)
MCV: 94.2 fL (ref 78.0–100.0)
Platelets: 467 10*3/uL — ABNORMAL HIGH (ref 150–400)
RBC: 2.94 MIL/uL — AB (ref 4.22–5.81)
RDW: 16.7 % — AB (ref 11.5–15.5)
WBC: 12.4 10*3/uL — AB (ref 4.0–10.5)

## 2016-08-16 LAB — MAGNESIUM: MAGNESIUM: 2.5 mg/dL — AB (ref 1.7–2.4)

## 2016-08-16 MED ORDER — LEVALBUTEROL HCL 0.63 MG/3ML IN NEBU
0.6300 mg | INHALATION_SOLUTION | Freq: Two times a day (BID) | RESPIRATORY_TRACT | Status: DC
  Administered 2016-08-16 – 2016-08-19 (×5): 0.63 mg via RESPIRATORY_TRACT
  Filled 2016-08-16 (×7): qty 3

## 2016-08-16 MED ORDER — LEVALBUTEROL HCL 0.63 MG/3ML IN NEBU
0.6300 mg | INHALATION_SOLUTION | Freq: Three times a day (TID) | RESPIRATORY_TRACT | Status: DC
  Administered 2016-08-16: 0.63 mg via RESPIRATORY_TRACT
  Filled 2016-08-16: qty 3

## 2016-08-16 NOTE — Progress Notes (Addendum)
Pt reports improvement in the discomfort he felt earlier today at incision/JP site.

## 2016-08-16 NOTE — Progress Notes (Signed)
Patient sleeping upon rounding. Pt denies complaints/needs at this time.

## 2016-08-16 NOTE — Progress Notes (Addendum)
      [address]       [place] [address]             [phone]      1 Day Post-Op Procedure(s) (LRB): INSERTION OF DRAIN ANTERIOR LEFT CHEST WALL HEMATOMA (Left)   Subjective:  Minor complaints at incision site.  Otherwise doing well, questions when we think drain will come out.  Objective: Vital signs in last 24 hours: Temp:  [97.5 F (36.4 C)-98.2 F (36.8 C)] 97.7 F (36.5 C) (08/04 0408) Pulse Rate:  [77-87] 81 (08/04 0408) Cardiac Rhythm: Normal sinus rhythm (08/04 0717) Resp:  [13-20] 16 (08/04 0408) BP: (92-120)/(52-75) 106/66 (08/04 0408) SpO2:  [95 %-100 %] 95 % (08/04 0721) Weight:  [134 lb 6.4 oz (61 kg)-135 lb 8 oz (61.5 kg)] 134 lb 6.4 oz (61 kg) (08/04 0408)  Intake/Output from previous day: 08/03 0701 - 08/04 0700 In: 2605.2 [P.O.:480; I.V.:1975.2; IV Piggyback:150] Out: 2190 [Urine:1875; Drains:115] Intake/Output this shift: Total I/O In: 673.3 [I.V.:623.3; IV Piggyback:50] Out: -   General appearance: alert, cooperative and no distress Heart: regular rate and rhythm Lungs: clear to auscultation bilaterally Wound: clean and dry  Lab Results:  Recent Labs  08/15/16 0445 08/16/16 0351  WBC 10.6* 12.4*  HGB 9.5* 9.0*  HCT 28.3* 27.7*  PLT 357 467*   BMET:  Recent Labs  08/16/16 0037 08/16/16 0351  NA 132* 134*  K 5.1 4.3  CL 105 106  CO2 23 23  GLUCOSE 171* 202*  BUN 13 11  CREATININE 0.81 0.82  CALCIUM 8.1* 8.0*    PT/INR: No results for input(s): LABPROT, INR in the last 72 hours. ABG    Component Value Date/Time   PHART 7.449 08/16/2016 0335   HCO3 21.2 08/16/2016 0335   TCO2 23 08/13/2016 1028   ACIDBASEDEF 2.4 (H) 08/16/2016 0335   O2SAT 93.9 08/16/2016 0335   CBG (last 3)   Recent Labs  08/15/16 1258  GLUCAP 110*    Assessment/Plan: S/P Procedure(s) (LRB): INSERTION OF DRAIN ANTERIOR LEFT CHEST WALL HEMATOMA (Left)  1. S/P Drainage of left chest wall hematoma- JP drain with 115 cc output  leave in place today 2. Dispo- care per primary   LOS: 12 days    [name] [name] 08/16/2016 Patient seen and examined, agree with above Will leave drain in place until dry  Keandra Medero C. Roxan Hockey, MD [facility] [phone]

## 2016-08-16 NOTE — Progress Notes (Signed)
PROGRESS NOTE  [name] [name] UEA:[id number] DOB: 16-Jun-1950 DOA: 08/04/2016 PCP: Patient, No Pcp Per  Brief summary:  Out of hospital Cardiac arrest, s/p cpr/defib, found to have  gi perforation s/p repair, rib fracture with large chest wall hematoma,t8 vertebral fracture, thought all from cpr, has LV thrombus, chf, delirium General surgery, cardiology, EP, thoracic surgery consulted Cardiac cath planned on 8/1,  CIR placement vs home health at discharge  HPI/Recap of past 24 hours: Pt ready to have chest hematoma drained today. Reports no new complaints.  Assessment/Plan: Active Problems:   Pneumoperitoneum   Syncope and collapse   Acute respiratory failure (HCC)   Frequent PVCs   Cardiomyopathy, ischemic   Chest wall trauma   Perforated gastric ulcer (HCC)   Tachycardia   Hypotension due to drugs   Benign essential HTN   Labile blood pressure   Pleural effusion   Hematoma of chest wall   S/P evacuation of hematoma   Cardiac arrest (presenting symptom):  - Out of hospital VF/VT arrest suspected, witness - unclear etiology. Required 1 shock and 2 - 3 minutes CPR before ROSC. 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 - He was intubated and admitted to icu,  - He has improved , extubated and transferred to hospitalist service on 7/27 - Pt fitted for life vest. Chest hematoma drained. Awaiting for clearance by surgical team  LV thrombus:  heparin drip held on 7/28 due to expanding large left chest wall hematoma - Hemoglobin stable and on last check a 9.3   Systolic CHF  -Echo on 4/78 lvef 35-40% with grade 1 diastolic dysfunction -Ct chest on 7/28 "Moderate bilateral pleural effusions are noted with adjacent subsegmental atelectasis of both lower lobes. -cardiology /heart failure team consulted, currently he is on lasix/spironolactone, coreg,  entresto   Hypokalemia/hypomagnesemia:  - WNL on last check. Magnesium at 1.7 Magnesium and potassium goals outlined in cardiology note secondary to. Will replace potassium orally and magnesium will be replaced as well. We'll recheck levels next a.m.  PERFORATED STOMACH ULCER vs perforation from CPR -peunoperitoneum on initial ct scan, underwent emergency exploratory laparotomy  EXPLORATORY LAPAROTOMY FREE AIR BIOPSY OF PERFORATED STOMACH ULCER REPAIR OF PERFORATED STOMACH ULCER WITH A OMENTAL PATCH (N/A) on 7/23 - He was on iv ppi, now on oral ppi - He is started back on heart healthy diet - He was on zosyn then unasyn since admission, day 9 abx as of 7/31  Large chest wall hematoma, rib fracture, acute blood loss anemia ( RN reported a fall on 7/27, however chest wall hematoma started prior to the suspected fall)  -Thoracic surgery Dr Lucianne Lei trigt consulted, conservation management, pressure dressing,   -s/p prbc transfusion/ffp, hold heparin drip - Plan is for drainage today. Post op care per surgeon.  Stable probable nondisplaced fracture involving T8 vertebral body. He denies back pain.  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 improving  Code Status: full  Family Communication: Discussed with patient and family member at bedside  Disposition Plan:  Once cleared for discharge after recent hematoma evacuation by cardiothoracic surgery  Consultants:  pccm admit, transferred to [facility] on 7/27  General surgery  cardiology /heart failure team  Thoracic surgery Dr Prescott Gum  Procedures:  Intubation/extubation  EXPLORATORY LAPAROTOMY FREE AIR BIOPSY OF PERFORATED STOMACH ULCER REPAIR OF PERFORATED STOMACH ULCER WITH A OMENTAL PATCH (  N/A)  prbc /ffp transfusion  Left IJ placement and removal   .    Antibiotics:  Zosyn then unasyn  eraxis then diflucan (last dose on 7/29)   Objective: BP (!) 127/57 (BP  Location: Left Arm)   Pulse 74   Temp 97.8 F (36.6 C) (Oral)   Resp 16   Ht 5\' 8"  (1.727 m)   Wt 61 kg (134 lb 6.4 oz)   SpO2 98%   BMI 20.44 kg/m   Intake/Output Summary (Last 24 hours) at 08/16/16 1606 Last data filed at 08/16/16 1319  Gross per 24 hour  Intake          1373.33 ml  Output             1775 ml  Net          -401.67 ml   Filed Weights   08/15/16 0519 08/15/16 1045 08/16/16 0408  Weight: 61.5 kg (135 lb 8 oz) 61.5 kg (135 lb 8 oz) 61 kg (134 lb 6.4 oz)    Exam:Exam unchanged when compared to 08/15/2016   General:   Awake and alert, no acute distress  Cardiovascular: RRR with ectopic beats, no gallops or rubs   Respiratory: diminished at basis, no wheezing, no rhonchi  Abdomen: potst op changes, Left chest wall elevated when compared to the right but decreased given recent drainage, soft, nondistended,  positive BS  Musculoskeletal: No lower extremity Edema, left arm edema is improving  Neuro: Awake and alert, no facial asymmetry, oriented to year/place/person  Data Reviewed: Basic Metabolic Panel:  Recent Labs Lab 08/12/16 0404 08/13/16 0259 08/14/16 1107 08/15/16 0445 08/16/16 0037 08/16/16 0351  NA 136 132*  --  133* 132* 134*  K 3.9 4.1  --  3.9 5.1 4.3  CL 107 105  --  106 105 106  CO2 21* 23  --  23 23 23   GLUCOSE 136* 123*  --  135* 171* 202*  BUN 11 9  --  9 13 11   CREATININE 0.76 0.75  --  0.67 0.81 0.82  CALCIUM 7.6* 7.5*  --  8.1* 8.1* 8.0*  MG 1.7 2.1 1.9 1.7  --  2.5*   Liver Function Tests:  Recent Labs Lab 08/10/16 0745 08/11/16 0528 08/13/16 0259  AST 58* 54* 44*  ALT 27 28 33  ALKPHOS 31* 34* 40  BILITOT 1.3* 1.4* 1.6*  PROT 4.2* 4.0* 4.2*  ALBUMIN 2.2* 2.0* 2.0*   No results for input(s): LIPASE, AMYLASE in the last 168 hours. No results for input(s): AMMONIA in the last 168 hours. CBC:  Recent Labs Lab 08/12/16 0404 08/13/16 0259 08/14/16 0303 08/15/16 0445 08/16/16 0351  WBC 11.8* 12.4* 13.1* 10.6*  12.4*  HGB 9.1* 8.9* 9.3* 9.5* 9.0*  HCT 26.6* 26.0* 28.0* 28.3* 27.7*  MCV 92.7 93.5 94.0 94.0 94.2  PLT 227 245 303 357 467*   Cardiac Enzymes:   No results for input(s): CKTOTAL, CKMB, CKMBINDEX, TROPONINI in the last 168 hours. BNP (last 3 results) No results for input(s): BNP in the last 8760 hours.  ProBNP (last 3 results) No results for input(s): PROBNP in the last 8760 hours.  CBG:  Recent Labs Lab 08/09/16 1632 08/09/16 2114 08/15/16 1258  GLUCAP 136* 127* 110*    Recent Results (from the past 240 hour(s))  Aerobic/Anaerobic Culture (surgical/deep wound)     Status: None (Preliminary result)   Collection Time: 08/15/16 11:57 AM  Result Value Ref Range Status   Specimen Description WOUND  CHEST LEFT  Final   Special Requests PATIENT ON FOLLOWING ZINACEF  Final   Gram Stain   Final    RARE WBC PRESENT, PREDOMINANTLY PMN NO ORGANISMS SEEN    Culture NO GROWTH 1 DAY  Final   Report Status PENDING  Incomplete     Studies: No results found.  Scheduled Meds: . bisacodyl  10 mg Oral Daily  . carvedilol  6.25 mg Oral BID WC  . folic acid  1 mg Oral Daily  . levalbuterol  0.63 mg Nebulization BID  . pneumococcal 23 valent vaccine  0.5 mL Intramuscular Tomorrow-1000  . potassium chloride  20 mEq Oral Once  . rosuvastatin  10 mg Oral q1800  . sacubitril-valsartan  1 tablet Oral BID  . senna-docusate  1 tablet Oral QHS    Continuous Infusions: . cefUROXime (ZINACEF)  IV Stopped (08/16/16 1119)  . potassium chloride      Time spent: > 35 mins  Velvet Bathe MD, PhD  [facility] Pager [phone] If 7PM-7AM, please contact night-coverage at [email or web address], password [facility] 08/16/2016, 4:06 PM  LOS: 12 days

## 2016-08-16 NOTE — Progress Notes (Signed)
Progress Note  Patient Name: [name] [name] Date of Encounter: 08/16/2016  Primary Cardiologist: new  Subjective   Incisional pain has improved.   Inpatient Medications    Scheduled Meds: . bisacodyl  10 mg Oral Daily  . carvedilol  6.25 mg Oral BID WC  . folic acid  1 mg Oral Daily  . levalbuterol  0.63 mg Nebulization BID  . pneumococcal 23 valent vaccine  0.5 mL Intramuscular Tomorrow-1000  . potassium chloride  20 mEq Oral Once  . rosuvastatin  10 mg Oral q1800  . sacubitril-valsartan  1 tablet Oral BID  . senna-docusate  1 tablet Oral QHS   Continuous Infusions: . cefUROXime (ZINACEF)  IV Stopped (08/16/16 1119)  . potassium chloride     PRN Meds: acetaminophen, fentaNYL (SUBLIMAZE) injection, ondansetron (ZOFRAN) IV, oxyCODONE, potassium chloride, traMADol   Vital Signs    Vitals:   08/15/16 2330 08/16/16 0408 08/16/16 0721 08/16/16 1321  BP: (!) 99/59 106/66  (!) 127/57  Pulse: 87 81  74  Resp: 16 16    Temp: (!) 97.5 F (36.4 C) 97.7 F (36.5 C)  97.8 F (36.6 C)  TempSrc: Oral Oral  Oral  SpO2: 95% 95% 95% 98%  Weight:  134 lb 6.4 oz (61 kg)    Height:        Intake/Output Summary (Last 24 hours) at 08/16/16 1324 Last data filed at 08/16/16 1319  Gross per 24 hour  Intake          1473.33 ml  Output             1750 ml  Net          -276.67 ml   Filed Weights   08/15/16 0519 08/15/16 1045 08/16/16 0408  Weight: 135 lb 8 oz (61.5 kg) 135 lb 8 oz (61.5 kg) 134 lb 6.4 oz (61 kg)    Telemetry    nsr - Personally Reviewed  ECG    none - Personally Reviewed  Physical Exam   GEN: well appearing 66 yo man, no acute distress.   Neck: 6 cm JVD Cardiac: RRR, no murmurs, rubs, or gallops.  Respiratory: Clear to auscultation bilaterally. GI: Soft, nontender, non-distended  MS: No edema; No deformity. Neuro:  Nonfocal  Psych: Normal affect   Labs    Chemistry Recent Labs Lab 08/10/16 0745 08/11/16 9622  08/13/16 0259  08/15/16 0445 08/16/16 0037 08/16/16 0351  NA 139 138  < > 132* 133* 132* 134*  K 3.0* 3.3*  < > 4.1 3.9 5.1 4.3  CL 108 107  < > 105 106 105 106  CO2 23 24  < > 23 23 23 23   GLUCOSE 106* 108*  < > 123* 135* 171* 202*  BUN 22* 14  < > 9 9 13 11   CREATININE 0.85 0.80  < > 0.75 0.67 0.81 0.82  CALCIUM 7.5* 7.4*  < > 7.5* 8.1* 8.1* 8.0*  PROT 4.2* 4.0*  --  4.2*  --   --   --   ALBUMIN 2.2* 2.0*  --  2.0*  --   --   --   AST 58* 54*  --  44*  --   --   --   ALT 27 28  --  33  --   --   --   ALKPHOS 31* 34*  --  40  --   --   --   BILITOT 1.3* 1.4*  --  1.6*  --   --   --  GFRNONAA >60 >60  < > >60 >60 >60 >60  GFRAA >60 >60  < > >60 >60 >60 >60  ANIONGAP 8 7  < > 4* 4* 4* 5  < > = values in this interval not displayed.   Hematology Recent Labs Lab 08/14/16 0303 08/15/16 0445 08/16/16 0351  WBC 13.1* 10.6* 12.4*  RBC 2.98* 3.01* 2.94*  HGB 9.3* 9.5* 9.0*  HCT 28.0* 28.3* 27.7*  MCV 94.0 94.0 94.2  MCH 31.2 31.6 30.6  MCHC 33.2 33.6 32.5  RDW 17.6* 17.1* 16.7*  PLT 303 357 467*    Cardiac EnzymesNo results for input(s): TROPONINI in the last 168 hours. No results for input(s): TROPIPOC in the last 168 hours.   BNPNo results for input(s): BNP, PROBNP in the last 168 hours.   DDimer No results for input(s): DDIMER in the last 168 hours.   Radiology    Dg Chest Port 1 View  Result Date: 08/15/2016 CLINICAL DATA:  Status post trabeculation of a left chest wall hematoma. EXAM: PORTABLE CHEST 1 VIEW COMPARISON:  Chest CT dated 08/08/2016 and portable chest dated 08/08/2016. FINDINGS: Stable enlarged cardiac silhouette. Increased dense opacity in the left lower lung zone and interval ill-defined opacity in the left perihilar region. Interval mild right basilar ill-defined opacity. Decreased inspiration. Tubing overlying the left chest. Thoracic spine degenerative changes. IMPRESSION: 1. Increased dense atelectasis and possible pneumonia in the left lower lung zone with interval  left perihilar pneumonia or patchy atelectasis. 2. Interval mild right basilar atelectasis or pneumonia. Electronically Signed   By: Claudie Revering M.D.   On: 08/15/2016 13:32    Cardiac Studies   none  Patient Profile     66 y.o. male  Admitted with VF arrest, chest wall hematoma, s/p evacuation.   Assessment & Plan    1. VF arrest - he is stable. Life Vest pending DC 2. Chest wall hematoma - s/p drain placement. He is doing well. 3. 3 Vessel CAD - see above. Medical therapy has been recommended due to anterior scar 4. Chronic systolic heart failure - his EF is 35-40%. He will continue his current meds.  Carleene Overlie Taylor,M.D Signed, Cristopher Peru, MD  08/16/2016, 1:24 PM  Patient ID: Carlene Coria, male   DOB: [birth year], 66 y.o.   MRN: [id number]

## 2016-08-17 ENCOUNTER — Inpatient Hospital Stay (HOSPITAL_COMMUNITY): Payer: PPO

## 2016-08-17 DIAGNOSIS — J9611 Chronic respiratory failure with hypoxia: Secondary | ICD-10-CM

## 2016-08-17 LAB — COMPREHENSIVE METABOLIC PANEL
ALK PHOS: 59 U/L (ref 38–126)
ALT: 54 U/L (ref 17–63)
AST: 48 U/L — AB (ref 15–41)
Albumin: 2.2 g/dL — ABNORMAL LOW (ref 3.5–5.0)
Anion gap: 8 (ref 5–15)
BUN: 13 mg/dL (ref 6–20)
CALCIUM: 8.2 mg/dL — AB (ref 8.9–10.3)
CHLORIDE: 106 mmol/L (ref 101–111)
CO2: 23 mmol/L (ref 22–32)
CREATININE: 0.76 mg/dL (ref 0.61–1.24)
Glucose, Bld: 116 mg/dL — ABNORMAL HIGH (ref 65–99)
Potassium: 3.7 mmol/L (ref 3.5–5.1)
SODIUM: 137 mmol/L (ref 135–145)
Total Bilirubin: 1.1 mg/dL (ref 0.3–1.2)
Total Protein: 4.7 g/dL — ABNORMAL LOW (ref 6.5–8.1)

## 2016-08-17 LAB — CBC
HCT: 27.9 % — ABNORMAL LOW (ref 39.0–52.0)
HEMOGLOBIN: 9 g/dL — AB (ref 13.0–17.0)
MCH: 30.9 pg (ref 26.0–34.0)
MCHC: 32.3 g/dL (ref 30.0–36.0)
MCV: 95.9 fL (ref 78.0–100.0)
PLATELETS: 492 10*3/uL — AB (ref 150–400)
RBC: 2.91 MIL/uL — AB (ref 4.22–5.81)
RDW: 16.7 % — ABNORMAL HIGH (ref 11.5–15.5)
WBC: 11.5 10*3/uL — AB (ref 4.0–10.5)

## 2016-08-17 NOTE — Progress Notes (Signed)
PROGRESS NOTE  Derrien Anschutz EQA:[id number] DOB: 1950-01-24 DOA: 08/04/2016 PCP: Patient, No Pcp Per  Brief summary:  Out of hospital Cardiac arrest, s/p cpr/defib, found to have  gi perforation s/p repair, rib fracture with large chest wall hematoma,t8 vertebral fracture, thought all from cpr, has LV thrombus, chf, delirium General surgery, cardiology, EP, thoracic surgery consulted Cardiac cath planned on 8/1, Suspect patient will need placement on d/c  HPI/Recap of past 24 hours: Pt has no new complaints reported to me today. He is looking forward to going home  Assessment/Plan: Active Problems:   Pneumoperitoneum   Syncope and collapse   Acute respiratory failure (HCC)   Frequent PVCs   Cardiomyopathy, ischemic   Chest wall trauma   Perforated gastric ulcer (HCC)   Tachycardia   Hypotension due to drugs   Benign essential HTN   Labile blood pressure   Pleural effusion   Hematoma of chest wall   S/P evacuation of hematoma   Cardiac arrest (presenting symptom):  - Out of hospital VF/VT arrest suspected, witness - unclear etiology. Required 1 shock and 2 - 3 minutes CPR before ROSC. 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 - He was intubated and admitted to icu,  - He has improved , extubated and transferred to hospitalist service on 7/27 - Pt fitted for life vest. Chest hematoma drained, surgical team recommending sleep drain in place again today.Marland Kitchen Awaiting for clearance for discharge by surgical team.  LV thrombus:  heparin drip held on 7/28 due to expanding large left chest wall hematoma - Hemoglobin stable and on last check a 9.0   Systolic CHF  -Echo on 9/79 lvef 35-40% with grade 1 diastolic dysfunction -Ct chest on 7/28 "Moderate bilateral pleural effusions are noted with adjacent subsegmental atelectasis of both lower lobes. -cardiology /heart failure team  consulted, currently he is on lasix/spironolactone, coreg, entresto   Hypokalemia/hypomagnesemia:  - WNL on last check. Magnesium at mildly above normal limits at 2.5 Magnesium and potassium goals outlined in cardiology note secondary to. Will replace potassium orally and magnesium will be replaced as well. We'll recheck levels next a.m.  PERFORATED STOMACH ULCER vs perforation from CPR -peunoperitoneum on initial ct scan, underwent emergency exploratory laparotomy  EXPLORATORY LAPAROTOMY FREE AIR BIOPSY OF PERFORATED STOMACH ULCER REPAIR OF PERFORATED STOMACH ULCER WITH A OMENTAL PATCH (N/A) on 7/23 - He was on iv ppi, now on oral ppi - He is started back on heart healthy diet - He was on zosyn then unasyn since admission, day 9 abx as of 7/31  Large chest wall hematoma, rib fracture, acute blood loss anemia ( RN reported a fall on 7/27, however chest wall hematoma started prior to the suspected fall)  -Thoracic surgery Dr Lucianne Lei trigt consulted, conservation management, pressure dressing,   -s/p prbc transfusion/ffp, hold heparin drip - Patient has drain in place currently draining chest wall hematoma. Post op care per surgeon.  Stable probable nondisplaced fracture involving T8 vertebral body. He denies back pain.  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 improving  Code Status: full  Family Communication: Discussed with patient and family member at bedside  Disposition Plan:  Once cleared for discharge after recent hematoma evacuation by cardiothoracic surgery  Consultants:  pccm admit, transferred to [facility] hospitalist on 7/27  General surgery  cardiology /heart failure team  Thoracic surgery Dr Lucianne Lei  Trigt  Procedures:  Intubation/extubation  EXPLORATORY LAPAROTOMY FREE AIR BIOPSY OF PERFORATED STOMACH ULCER REPAIR OF PERFORATED STOMACH ULCER WITH A OMENTAL PATCH (N/A)  prbc /ffp transfusion  Left IJ placement and  removal   .    Antibiotics:  Zosyn then unasyn  eraxis then diflucan (last dose on 7/29)   Objective: BP (!) 124/57 (BP Location: Left Arm)   Pulse 68   Temp 98.1 F (36.7 C) (Oral)   Resp 18   Ht 5\' 8"  (1.727 m)   Wt 59.4 kg (131 lb)   SpO2 96%   BMI 19.92 kg/m   Intake/Output Summary (Last 24 hours) at 08/17/16 1302 Last data filed at 08/17/16 1248  Gross per 24 hour  Intake             1060 ml  Output             2210 ml  Net            -1150 ml   Filed Weights   08/15/16 1045 08/16/16 0408 08/17/16 0500  Weight: 61.5 kg (135 lb 8 oz) 61 kg (134 lb 6.4 oz) 59.4 kg (131 lb)    Exam:Exam unchanged when compared to 08/16/2016   General:   Awake and alert, no acute distress  Cardiovascular: RRR with ectopic beats, no gallops or rubs   Respiratory: diminished at basis, no wheezing, no rhonchi  Abdomen: potst op changes, Left chest wall elevated when compared to the right but decreased given recent drainage, tube in place, soft, nondistended,  positive BS  Musculoskeletal: No lower extremity Edema, left arm edema is improving  Neuro: Awake and alert, no facial asymmetry, oriented to year/place/person  Data Reviewed: Basic Metabolic Panel:  Recent Labs Lab 08/12/16 0404 08/13/16 0259 08/14/16 1107 08/15/16 0445 08/16/16 0037 08/16/16 0351 08/17/16 0403  NA 136 132*  --  133* 132* 134* 137  K 3.9 4.1  --  3.9 5.1 4.3 3.7  CL 107 105  --  106 105 106 106  CO2 21* 23  --  23 23 23 23   GLUCOSE 136* 123*  --  135* 171* 202* 116*  BUN 11 9  --  9 13 11 13   CREATININE 0.76 0.75  --  0.67 0.81 0.82 0.76  CALCIUM 7.6* 7.5*  --  8.1* 8.1* 8.0* 8.2*  MG 1.7 2.1 1.9 1.7  --  2.5*  --    Liver Function Tests:  Recent Labs Lab 08/11/16 0528 08/13/16 0259 08/17/16 0403  AST 54* 44* 48*  ALT 28 33 54  ALKPHOS 34* 40 59  BILITOT 1.4* 1.6* 1.1  PROT 4.0* 4.2* 4.7*  ALBUMIN 2.0* 2.0* 2.2*   No results for input(s): LIPASE, AMYLASE in the last 168  hours. No results for input(s): AMMONIA in the last 168 hours. CBC:  Recent Labs Lab 08/13/16 0259 08/14/16 0303 08/15/16 0445 08/16/16 0351 08/17/16 0403  WBC 12.4* 13.1* 10.6* 12.4* 11.5*  HGB 8.9* 9.3* 9.5* 9.0* 9.0*  HCT 26.0* 28.0* 28.3* 27.7* 27.9*  MCV 93.5 94.0 94.0 94.2 95.9  PLT 245 303 357 467* 492*   Cardiac Enzymes:   No results for input(s): CKTOTAL, CKMB, CKMBINDEX, TROPONINI in the last 168 hours. BNP (last 3 results) No results for input(s): BNP in the last 8760 hours.  ProBNP (last 3 results) No results for input(s): PROBNP in the last 8760 hours.  CBG:  Recent Labs Lab 08/15/16 1258  GLUCAP 110*    Recent Results (from  the past 240 hour(s))  Aerobic/Anaerobic Culture (surgical/deep wound)     Status: None (Preliminary result)   Collection Time: 08/15/16 11:57 AM  Result Value Ref Range Status   Specimen Description WOUND CHEST LEFT  Final   Special Requests PATIENT ON FOLLOWING ZINACEF  Final   Gram Stain   Final    RARE WBC PRESENT, PREDOMINANTLY PMN NO ORGANISMS SEEN    Culture   Final    NO GROWTH 2 DAYS NO ANAEROBES ISOLATED; CULTURE IN PROGRESS FOR 5 DAYS   Report Status PENDING  Incomplete     Studies: Dg Chest Port 1 View  Result Date: 08/17/2016 CLINICAL DATA:  Left chest wall hematoma EXAM: PORTABLE CHEST 1 VIEW COMPARISON:  Two days ago FINDINGS: A drain is present over the left chest wall. There is haziness of the left more than right chest consistent with pleural fluid, decreasing or inferiorly accumulating. The superimposed left lung is obscured at the base. No pneumothorax or pulmonary edema. Known left rib fractures. Cardiomegaly with left ventricular apical calcification. IMPRESSION: 1. Decreased or inferiorly accumulating small pleural effusions on this upright film. 2. Presumed retrocardiac atelectasis. 3. Left ventricular apex calcification which may reflect aneurysm in this patient with LV thrombus. Electronically Signed   By:  Monte Fantasia M.D.   On: 08/17/2016 07:41    Scheduled Meds: . bisacodyl  10 mg Oral Daily  . carvedilol  6.25 mg Oral BID WC  . folic acid  1 mg Oral Daily  . levalbuterol  0.63 mg Nebulization BID  . pneumococcal 23 valent vaccine  0.5 mL Intramuscular Tomorrow-1000  . potassium chloride  20 mEq Oral Once  . rosuvastatin  10 mg Oral q1800  . sacubitril-valsartan  1 tablet Oral BID  . senna-docusate  1 tablet Oral QHS    Continuous Infusions: . cefUROXime (ZINACEF)  IV Stopped (08/17/16 0837)  . potassium chloride      Time spent: > 35 mins  Velvet Bathe MD, PhD  [facility] Pager [phone] If 7PM-7AM, please contact night-coverage at [email or web address], password [facility] 08/17/2016, 1:02 PM  LOS: 13 days

## 2016-08-17 NOTE — Progress Notes (Addendum)
      [address]       [place] [address]             [phone]      2 Days Post-Op Procedure(s) (LRB): INSERTION OF DRAIN ANTERIOR LEFT CHEST WALL HEMATOMA (Left)   Subjective:  No new complaints.  Continues to soresness at incision site.   Objective: Vital signs in last 24 hours: Temp:  [97.8 F (36.6 C)-98 F (36.7 C)] 98 F (36.7 C) (08/05 0500) Pulse Rate:  [72-77] 74 (08/05 0500) Cardiac Rhythm: Normal sinus rhythm (08/05 0700) Resp:  [20] 20 (08/05 0500) BP: (118-127)/(56-79) 121/69 (08/05 0500) SpO2:  [97 %-98 %] 97 % (08/05 0821) Weight:  [131 lb (59.4 kg)] 131 lb (59.4 kg) (08/05 0500)  Intake/Output from previous day: 08/04 0701 - 08/05 0700 In: 1083.3 [P.O.:360; I.V.:623.3; IV Piggyback:100] Out: 2035 [Urine:1950; Drains:85] Intake/Output this shift: Total I/O In: 410 [P.O.:360; IV Piggyback:50] Out: -   General appearance: alert, cooperative and no distress Heart: regular rate and rhythm Lungs: clear to auscultation bilaterally Wound: clean and dry  Lab Results:  Recent Labs  08/16/16 0351 08/17/16 0403  WBC 12.4* 11.5*  HGB 9.0* 9.0*  HCT 27.7* 27.9*  PLT 467* 492*   BMET:  Recent Labs  08/16/16 0351 08/17/16 0403  NA 134* 137  K 4.3 3.7  CL 106 106  CO2 23 23  GLUCOSE 202* 116*  BUN 11 13  CREATININE 0.82 0.76  CALCIUM 8.0* 8.2*    PT/INR: No results for input(s): LABPROT, INR in the last 72 hours. ABG    Component Value Date/Time   PHART 7.449 08/16/2016 0335   HCO3 21.2 08/16/2016 0335   TCO2 23 08/13/2016 1028   ACIDBASEDEF 2.4 (H) 08/16/2016 0335   O2SAT 93.9 08/16/2016 0335   CBG (last 3)   Recent Labs  08/15/16 1258  GLUCAP 110*    Assessment/Plan: S/P Procedure(s) (LRB): INSERTION OF DRAIN ANTERIOR LEFT CHEST WALL HEMATOMA (Left)  1. S/P Drainage of chest wall hematoma- JP drain with 55 cc output yesterday- leave in place today 2. Care per primary  LOS: 13 days    [name],  [name] 08/17/2016 Patient seen and examined, agree with above Keep drain in place for now  [place]. Roxan Hockey, MD [facility] [phone]

## 2016-08-18 DIAGNOSIS — I5022 Chronic systolic (congestive) heart failure: Secondary | ICD-10-CM

## 2016-08-18 DIAGNOSIS — I2510 Atherosclerotic heart disease of native coronary artery without angina pectoris: Secondary | ICD-10-CM

## 2016-08-18 LAB — TYPE AND SCREEN
ABO/RH(D): A POS
ANTIBODY SCREEN: NEGATIVE
UNIT DIVISION: 0
Unit division: 0
Unit division: 0

## 2016-08-18 LAB — BASIC METABOLIC PANEL
Anion gap: 7 (ref 5–15)
BUN: 10 mg/dL (ref 6–20)
CALCIUM: 8.5 mg/dL — AB (ref 8.9–10.3)
CHLORIDE: 106 mmol/L (ref 101–111)
CO2: 23 mmol/L (ref 22–32)
CREATININE: 0.65 mg/dL (ref 0.61–1.24)
GFR calc non Af Amer: >60 mL/min (ref >60)
Glucose, Bld: 151 mg/dL — ABNORMAL HIGH (ref 65–99)
Potassium: 3.8 mmol/L (ref 3.5–5.1)
SODIUM: 136 mmol/L (ref 135–145)

## 2016-08-18 LAB — CBC WITH DIFFERENTIAL/PLATELET
BASOS PCT: 0 %
Basophils Absolute: 0 10*3/uL (ref 0.0–0.1)
EOS ABS: 0.3 10*3/uL (ref 0.0–0.7)
EOS PCT: 3 %
HCT: 30.7 % — ABNORMAL LOW (ref 39.0–52.0)
Hemoglobin: 9.8 g/dL — ABNORMAL LOW (ref 13.0–17.0)
LYMPHS ABS: 2 10*3/uL (ref 0.7–4.0)
Lymphocytes Relative: 21 %
MCH: 30.4 pg (ref 26.0–34.0)
MCHC: 31.9 g/dL (ref 30.0–36.0)
MCV: 95.3 fL (ref 78.0–100.0)
Monocytes Absolute: 0.6 10*3/uL (ref 0.1–1.0)
Monocytes Relative: 6 %
Neutro Abs: 6.7 10*3/uL (ref 1.7–7.7)
Neutrophils Relative %: 70 %
PLATELETS: 524 10*3/uL — AB (ref 150–400)
RBC: 3.22 MIL/uL — AB (ref 4.22–5.81)
RDW: 16.6 % — ABNORMAL HIGH (ref 11.5–15.5)
WBC: 9.6 10*3/uL (ref 4.0–10.5)

## 2016-08-18 LAB — BPAM RBC
BLOOD PRODUCT EXPIRATION DATE: 201808092359
BLOOD PRODUCT EXPIRATION DATE: 201808272359
Blood Product Expiration Date: 201808272359
ISSUE DATE / TIME: 201808022030
UNIT TYPE AND RH: 6200
Unit Type and Rh: 6200
Unit Type and Rh: 6200

## 2016-08-18 LAB — MAGNESIUM: MAGNESIUM: 1.7 mg/dL (ref 1.7–2.4)

## 2016-08-18 MED ORDER — MAGNESIUM OXIDE 400 (241.3 MG) MG PO TABS
400.0000 mg | ORAL_TABLET | Freq: Every day | ORAL | Status: DC
  Administered 2016-08-18 – 2016-08-19 (×2): 400 mg via ORAL
  Filled 2016-08-18 (×2): qty 1

## 2016-08-18 MED ORDER — POTASSIUM CHLORIDE CRYS ER 20 MEQ PO TBCR
40.0000 meq | EXTENDED_RELEASE_TABLET | Freq: Once | ORAL | Status: AC
  Administered 2016-08-18: 40 meq via ORAL
  Filled 2016-08-18: qty 2

## 2016-08-18 MED ORDER — MAGNESIUM SULFATE 50 % IJ SOLN
3.0000 g | Freq: Once | INTRAMUSCULAR | Status: AC
  Administered 2016-08-18: 3 g via INTRAVENOUS
  Filled 2016-08-18: qty 6

## 2016-08-18 NOTE — Progress Notes (Signed)
Nutrition Follow-up  DOCUMENTATION CODES:   Not applicable  INTERVENTION:    Continue [employer] Instant Breakfast supplement BID on meal trays   Continue HS snack   NUTRITION DIAGNOSIS:   Increased nutrient needs related to  (post-op healing) as evidenced by estimated needs   Ongoing  GOAL:   Patient will meet greater than or equal to 90% of their needs  Met  MONITOR:   PO intake, Labs, Weight trends, Skin, I & O's  ASSESSMENT:   66 yo Male with unknown PMH who arrived by [facility] after witnessed collapse. Bystander CPR for 2-3 mins.  AED was placed and shocked once.  On EMS arrival, patient was in [place] 120 with pulses.  On arrival to ER, patient was unresponsive with GCS of 3 and agonal and therefore intubated for airway protection.  7/23 Ex lap with omental patch of stomach (perforated stomach) 7/27 Extubated, Pt did not receive TF while on vent 7/28 Diet advanced to CL  Pt currently on a Heart Healthy diet. Appetite much improved. Average PO intake is 80% per flowsheets. Labs and medications reviewed. Cleared for D/C per Surgery.  Diet Order:  Diet Heart Room service appropriate? Yes; Fluid consistency: Thin  Skin:  Reviewed, no issues  Last BM:  8/6  Height:   Ht Readings from Last 1 Encounters:  08/15/16 '5\' 8"'  (1.727 m)   Weight:   Wt Readings from Last 1 Encounters:  08/18/16 128 lb 9.6 oz (58.3 kg)   Ideal Body Weight:  70 kg  BMI:  Body mass index is 19.55 kg/m.  Estimated Nutritional Needs:   Kcal:  1800-2000 kcals  Protein:  90-100 g  Fluid:  >/= 1.8 L  EDUCATION NEEDS:   No education needs identified at this time  Arthur Holms, RD, LDN Pager #: [phone] After-Hours Pager #: [phone]

## 2016-08-18 NOTE — Progress Notes (Signed)
Patient ID: [name] [name], male   DOB: June 20, 1950, 66 y.o.   MRN: [id number]  [facility] Surgery Progress Note  3 Days Post-Op  Subjective: CC- gastric perforation Sitting up in chair. States that he may be going home today. Denies abdominal pain. Tolerating diet. Denies n/v. Having daily BMs. States that his abdominal wound dressing is not being changed every day.  Objective: Vital signs in last 24 hours: Temp:  [98.1 F (36.7 C)-98.2 F (36.8 C)] 98.2 F (36.8 C) (08/05 2021) Pulse Rate:  [68-78] 78 (08/05 2021) Resp:  [18] 18 (08/05 2021) BP: (124-132)/(57-61) 132/61 (08/05 2021) SpO2:  [96 %-99 %] 96 % (08/06 0808) Weight:  [128 lb 9.6 oz (58.3 kg)] 128 lb 9.6 oz (58.3 kg) (08/06 0600) Last BM Date: 08/18/16  Intake/Output from previous day: 08/05 0701 - 08/06 0700 In: 1130 [P.O.:1080; IV Piggyback:50] Out: 2070 [Urine:2050; Drains:20] Intake/Output this shift: Total I/O In: -  Out: 30 [Drains:30]  PE: Gen:  Alert, NAD, pleasant Pulm:  Normal effort, CTAB. Drain left chest with bloody drainage Abd: Soft, non-tender, non-distended, midline incision healing well with good granulation tissue and no active drainage or surrounding erythema; previous JP drain site cdi with no drainage Skin: warm and dry, no rashes  Psych: A&Ox3   Lab Results:   Recent Labs  08/17/16 0403 08/18/16 0823  WBC 11.5* 9.6  HGB 9.0* 9.8*  HCT 27.9* 30.7*  PLT 492* 524*   BMET  Recent Labs  08/17/16 0403 08/18/16 0823  NA 137 136  K 3.7 3.8  CL 106 106  CO2 23 23  GLUCOSE 116* 151*  BUN 13 10  CREATININE 0.76 0.65  CALCIUM 8.2* 8.5*   PT/INR No results for input(s): LABPROT, INR in the last 72 hours. CMP     Component Value Date/Time   NA 136 08/18/2016 0823   K 3.8 08/18/2016 0823   CL 106 08/18/2016 0823   CO2 23 08/18/2016 0823   GLUCOSE 151 (H) 08/18/2016 0823   BUN 10 08/18/2016 0823   CREATININE 0.65 08/18/2016 0823   CALCIUM 8.5 (L) 08/18/2016  0823   PROT 4.7 (L) 08/17/2016 0403   ALBUMIN 2.2 (L) 08/17/2016 0403   AST 48 (H) 08/17/2016 0403   ALT 54 08/17/2016 0403   ALKPHOS 59 08/17/2016 0403   BILITOT 1.1 08/17/2016 0403   GFRNONAA >60 08/18/2016 0823   GFRAA >60 08/18/2016 0823   Lipase  No results found for: LIPASE     Studies/Results: Dg Chest Port 1 View  Result Date: 08/17/2016 CLINICAL DATA:  Left chest wall hematoma EXAM: PORTABLE CHEST 1 VIEW COMPARISON:  Two days ago FINDINGS: A drain is present over the left chest wall. There is haziness of the left more than right chest consistent with pleural fluid, decreasing or inferiorly accumulating. The superimposed left lung is obscured at the base. No pneumothorax or pulmonary edema. Known left rib fractures. Cardiomegaly with left ventricular apical calcification. IMPRESSION: 1. Decreased or inferiorly accumulating small pleural effusions on this upright film. 2. Presumed retrocardiac atelectasis. 3. Left ventricular apex calcification which may reflect aneurysm in this patient with LV thrombus. Electronically Signed   By: Monte Fantasia M.D.   On: 08/17/2016 07:41    Anti-infectives: Anti-infectives    Start     Dose/Rate Route Frequency Ordered Stop   08/15/16 2330  cefUROXime (ZINACEF) 1.5 g in dextrose 5 % 50 mL IVPB     1.5 g 100 mL/hr over 30 Minutes Intravenous Every  12 hours 08/15/16 1346 08/17/16 2226   08/15/16 0600  cefUROXime (ZINACEF) 1.5 g in dextrose 5 % 50 mL IVPB     1.5 g 100 mL/hr over 30 Minutes Intravenous To Short Stay 08/14/16 1058 08/15/16 1203   08/06/16 1200  fluconazole (DIFLUCAN) IVPB 400 mg     400 mg 100 mL/hr over 120 Minutes Intravenous Every 24 hours 08/06/16 0959 08/10/16 1400   08/06/16 1100  Ampicillin-Sulbactam (UNASYN) 3 g in sodium chloride 0.9 % 100 mL IVPB  Status:  Discontinued     3 g 200 mL/hr over 30 Minutes Intravenous Every 6 hours 08/06/16 0959 08/14/16 0722   08/05/16 1730  anidulafungin (ERAXIS) 100 mg in sodium  chloride 0.9 % 100 mL IVPB  Status:  Discontinued     100 mg 78 mL/hr over 100 Minutes Intravenous Every 24 hours 08/04/16 1657 08/06/16 0959   08/05/16 0000  piperacillin-tazobactam (ZOSYN) IVPB 3.375 g  Status:  Discontinued     3.375 g 12.5 mL/hr over 240 Minutes Intravenous Every 8 hours 08/04/16 1717 08/06/16 0959   08/04/16 1800  piperacillin-tazobactam (ZOSYN) IVPB 3.375 g  Status:  Discontinued     3.375 g 100 mL/hr over 30 Minutes Intravenous To Surgery 08/04/16 1754 08/04/16 1848   08/04/16 1730  anidulafungin (ERAXIS) 200 mg in sodium chloride 0.9 % 200 mL IVPB     200 mg 78 mL/hr over 200 Minutes Intravenous  Once 08/04/16 1657 08/05/16 0222   08/04/16 1700  piperacillin-tazobactam (ZOSYN) IVPB 3.375 g  Status:  Discontinued     3.375 g 100 mL/hr over 30 Minutes Intravenous  Once 08/04/16 1655 08/06/16 0959       Assessment/Plan Cardiac arrest VT/VF CAD with ischemia CM LV thrombus CHF Large chest wall hematoma - drain per CT surgery Acute blood loss anemia ETOH and tobacco abuse  s/p Procedure(s): EXPLORATORY LAPAROTOMY FREE AIR BIOPSY OF PERFORATED STOMACH ULCER REPAIR OF PERFORATED STOMACH ULCER WITH A OMENTAL PATCH (N/A) 7/23 Dr. Grandville Silos - POD 14 - continue HH diet  - daily wet to dry dressing changes to abdominal wound - JP drain out - OK to shower  - mobilize as tolerated/IS  - Patient stable for discharge from surgical standpoint.  F/U with Dr. Grandville Silos in about 2 weeks. Continue daily wet to dry dressing changes.  Encouraged smoking cessation.  FEN - HH diet  ID - Zosyn 7/23-24, Unasyn 7/25-8/1, cefuroxime 8/3>>8/5 VTE - SCD, no chemical DVT prophylaxis due to chest wall hematoma     LOS: 14 days    Wellington Hampshire , [facility] Surgery 08/18/2016, 10:18 AM Pager: [phone] Consults: [phone] Mon-Fri 7:00 am-4:30 pm Sat-Sun 7:00 am-11:30 am

## 2016-08-18 NOTE — Care Management Note (Signed)
Case Management Note  Patient Details  Name: [name] [name] MRN: [id number] Date of Birth: [birth year]  Subjective/Objective:    Patient admitted s/p cardiac arrest in community. Has gastric perforation and chest wall hematoma. Has Life Vest.                  Action/Plan: Case manager spoke with patient and wife concerning discharge plan. Choice for [place] was offered, referral was called to Butch Penny, [facility]iaison. Life vest has been delivered to patient's room. His cell is [phone], wife [name] [name]'s# is [phone].    Expected Discharge Date:    08/18/16              Expected Discharge Plan:  [place]  In-House Referral:     Discharge planning Services  CM Consult  Post Acute Care Choice:  Home Health Choice offered to:  Patient, Spouse  DME Arranged:  Life vest DME Agency:  [facility]  HH Arranged:  RN, PT, OT HH Agency:  [facility]  Status of Service:  Completed, signed off  If discussed at [place] of Stay Meetings, dates discussed:    Additional Comments:  Ninfa Meeker, RN 08/18/2016, 2:48 PM

## 2016-08-18 NOTE — Progress Notes (Signed)
PROGRESS NOTE  [name] [name] ZYY:[id number] DOB: [birth year] DOA: 08/04/2016 PCP: Patient, No Pcp Per  Brief summary:  Out of hospital Cardiac arrest, s/p cpr/defib, found to have  gi perforation s/p repair, rib fracture with large chest wall hematoma,t8 vertebral fracture, thought all from cpr, has LV thrombus, chf, delirium General surgery, cardiology, EP, thoracic surgery consulted Cardiac cath planned on 8/1, Suspect patient will need placement on d/c  HPI/Recap of past 24 hours: Pt wants to go home soon.  Assessment/Plan: Active Problems:   Pneumoperitoneum   Syncope and collapse   Acute respiratory failure (HCC)   Frequent PVCs   Cardiomyopathy, ischemic   Chest wall trauma   Perforated gastric ulcer (HCC)   Tachycardia   Hypotension due to drugs   Benign essential HTN   Labile blood pressure   Pleural effusion   Hematoma of chest wall   S/P evacuation of hematoma   Cardiac arrest (presenting symptom):  - Out of hospital VF/VT arrest suspected, witness - unclear etiology. Required 1 shock and 2 - 3 minutes CPR before ROSC. 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 - He was intubated and admitted to icu,  - He has improved , extubated and transferred to hospitalist service on 7/27 - Pt fitted for life vest. Chest hematoma drained, surgical team recommending to keep drain in place again today. Awaiting for clearance for discharge by surgical team.  LV thrombus:  heparin drip held on 7/28 due to expanding large left chest wall hematoma - Hemoglobin stable and on last check a 9.0   Systolic CHF  -Echo on 4/88 lvef 35-40% with grade 1 diastolic dysfunction -Ct chest on 7/28 "Moderate bilateral pleural effusions are noted with adjacent subsegmental atelectasis of both lower lobes. -cardiology /heart failure team consulted, currently he is on lasix/spironolactone, coreg,  entresto   Hypokalemia/hypomagnesemia:  - WNL on last check. Magnesium at mildly above normal limits at 2.5 Magnesium and potassium goals outlined by cardiology. Magnesium will be replaced to goal of 2.0 or more  PERFORATED STOMACH ULCER vs perforation from CPR -peunoperitoneum on initial ct scan, underwent emergency exploratory laparotomy  EXPLORATORY LAPAROTOMY FREE AIR BIOPSY OF PERFORATED STOMACH ULCER REPAIR OF PERFORATED STOMACH ULCER WITH A OMENTAL PATCH (N/A) on 7/23 - He was on iv ppi, now on oral ppi - He is started back on heart healthy diet - He was on zosyn then unasyn since admission, completed 9 abx as of 7/31  Large chest wall hematoma, rib fracture, acute blood loss anemia ( RN reported a fall on 7/27, however chest wall hematoma started prior to the suspected fall)  -Thoracic surgery Dr Lucianne Lei trigt consulted - hold heparin drip - Still has drain in place currently draining chest wall hematoma. Post op care per surgeon.  Stable probable nondisplaced fracture involving T8 vertebral body. He denies back pain.  Encephalopathy:  - appears to be at baseline  Code Status: full  Family Communication: Discussed with patient and family member at bedside  Disposition Plan:  Once cleared for discharge after recent hematoma evacuation by cardiothoracic surgery  Consultants:  pccm admit, transferred to [facility] hospitalist on 7/27  General surgery  cardiology /heart failure team  Thoracic surgery Dr Prescott Gum  Procedures:  Intubation/extubation  EXPLORATORY LAPAROTOMY FREE AIR BIOPSY OF PERFORATED STOMACH ULCER REPAIR OF PERFORATED STOMACH ULCER WITH A OMENTAL PATCH (N/A)  prbc /ffp transfusion  Left IJ placement  and removal   .    Antibiotics:  Zosyn then unasyn  eraxis then diflucan (last dose on 7/29)   Objective: BP (!) 116/57 (BP Location: Left Arm)   Pulse 72   Temp 98.2 F (36.8 C) (Oral)   Resp 18   Ht 5\' 8"  (1.727 m)   Wt 58.3 kg (128 lb 9.6 oz)    SpO2 96%   BMI 19.55 kg/m   Intake/Output Summary (Last 24 hours) at 08/18/16 1422 Last data filed at 08/18/16 1443  Gross per 24 hour  Intake              840 ml  Output             1900 ml  Net            -1060 ml   Filed Weights   08/16/16 0408 08/17/16 0500 08/18/16 0600  Weight: 61 kg (134 lb 6.4 oz) 59.4 kg (131 lb) 58.3 kg (128 lb 9.6 oz)    Exam:Exam unchanged when compared to 08/17/2016   General:   Awake and alert, no acute distress  Cardiovascular: RRR with ectopic beats, no gallops or rubs   Respiratory: diminished at basis, no wheezing, no rhonchi  Abdomen: potst op changes, Left chest wall drain in place, soft, nondistended,  positive BS  Musculoskeletal: No lower extremity Edema, left arm edema is improving  Neuro: Awake and alert, no facial asymmetry, oriented to year/place/person  Data Reviewed: Basic Metabolic Panel:  Recent Labs Lab 08/13/16 0259 08/14/16 1107 08/15/16 0445 08/16/16 0037 08/16/16 0351 08/17/16 0403 08/18/16 0823  NA 132*  --  133* 132* 134* 137 136  K 4.1  --  3.9 5.1 4.3 3.7 3.8  CL 105  --  106 105 106 106 106  CO2 23  --  23 23 23 23 23   GLUCOSE 123*  --  135* 171* 202* 116* 151*  BUN 9  --  9 13 11 13 10   CREATININE 0.75  --  0.67 0.81 0.82 0.76 0.65  CALCIUM 7.5*  --  8.1* 8.1* 8.0* 8.2* 8.5*  MG 2.1 1.9 1.7  --  2.5*  --  1.7   Liver Function Tests:  Recent Labs Lab 08/13/16 0259 08/17/16 0403  AST 44* 48*  ALT 33 54  ALKPHOS 40 59  BILITOT 1.6* 1.1  PROT 4.2* 4.7*  ALBUMIN 2.0* 2.2*   No results for input(s): LIPASE, AMYLASE in the last 168 hours. No results for input(s): AMMONIA in the last 168 hours. CBC:  Recent Labs Lab 08/14/16 0303 08/15/16 0445 08/16/16 0351 08/17/16 0403 08/18/16 0823  WBC 13.1* 10.6* 12.4* 11.5* 9.6  NEUTROABS  --   --   --   --  6.7  HGB 9.3* 9.5* 9.0* 9.0* 9.8*  HCT 28.0* 28.3* 27.7* 27.9* 30.7*  MCV 94.0 94.0 94.2 95.9 95.3  PLT 303 357 467* 492* 524*    Cardiac Enzymes:   No results for input(s): CKTOTAL, CKMB, CKMBINDEX, TROPONINI in the last 168 hours. BNP (last 3 results) No results for input(s): BNP in the last 8760 hours.  ProBNP (last 3 results) No results for input(s): PROBNP in the last 8760 hours.  CBG:  Recent Labs Lab 08/15/16 1258  GLUCAP 110*    Recent Results (from the past 240 hour(s))  Aerobic/Anaerobic Culture (surgical/deep wound)     Status: None (Preliminary result)   Collection Time: 08/15/16 11:57 AM  Result Value Ref Range Status   Specimen Description  WOUND CHEST LEFT  Final   Special Requests PATIENT ON FOLLOWING ZINACEF  Final   Gram Stain   Final    RARE WBC PRESENT, PREDOMINANTLY PMN NO ORGANISMS SEEN    Culture   Final    NO GROWTH 3 DAYS NO ANAEROBES ISOLATED; CULTURE IN PROGRESS FOR 5 DAYS   Report Status PENDING  Incomplete     Studies: No results found.  Scheduled Meds: . bisacodyl  10 mg Oral Daily  . carvedilol  6.25 mg Oral BID WC  . folic acid  1 mg Oral Daily  . levalbuterol  0.63 mg Nebulization BID  . pneumococcal 23 valent vaccine  0.5 mL Intramuscular Tomorrow-1000  . potassium chloride  20 mEq Oral Once  . rosuvastatin  10 mg Oral q1800  . sacubitril-valsartan  1 tablet Oral BID  . senna-docusate  1 tablet Oral QHS    Continuous Infusions: . potassium chloride      Time spent: > 35 mins  Velvet Bathe MD, PhD  [facility] Pager [phone] If 7PM-7AM, please contact night-coverage at [email or web address], password [facility] 08/18/2016, 2:22 PM  LOS: 14 days

## 2016-08-18 NOTE — Progress Notes (Addendum)
[id number]       [place] [id number]             [phone]      3 Days Post-Op Procedure(s) (LRB): INSERTION OF DRAIN ANTERIOR LEFT CHEST WALL HEMATOMA (Left) Subjective: Feels ok, some discomfort  Objective: Vital signs in last 24 hours: Temp:  [98.1 F (36.7 C)-98.2 F (36.8 C)] 98.2 F (36.8 C) (08/05 2021) Pulse Rate:  [68-78] 78 (08/05 2021) Cardiac Rhythm: Normal sinus rhythm (08/06 0701) Resp:  [18] 18 (08/05 2021) BP: (124-132)/(57-61) 132/61 (08/05 2021) SpO2:  [96 %-99 %] 99 % (08/05 2037) Weight:  [128 lb 9.6 oz (58.3 kg)] 128 lb 9.6 oz (58.3 kg) (08/06 0600)  Hemodynamic parameters for last 24 hours:    Intake/Output from previous day: 08/05 0701 - 08/06 0700 In: 1130 [P.O.:1080; IV Piggyback:50] Out: 2070 [Urine:2050; Drains:20] Intake/Output this shift: No intake/output data recorded.  General appearance: alert, cooperative and no distress Heart: regular rate and rhythm Lungs: slightly dim left base Abdomen: benign  Lab Results:  Recent Labs  08/16/16 0351 08/17/16 0403  WBC 12.4* 11.5*  HGB 9.0* 9.0*  HCT 27.7* 27.9*  PLT 467* 492*   BMET:  Recent Labs  08/16/16 0351 08/17/16 0403  NA 134* 137  K 4.3 3.7  CL 106 106  CO2 23 23  GLUCOSE 202* 116*  BUN 11 13  CREATININE 0.82 0.76  CALCIUM 8.0* 8.2*    PT/INR: No results for input(s): LABPROT, INR in the last 72 hours. ABG    Component Value Date/Time   PHART 7.449 08/16/2016 0335   HCO3 21.2 08/16/2016 0335   TCO2 23 08/13/2016 1028   ACIDBASEDEF 2.4 (H) 08/16/2016 0335   O2SAT 93.9 08/16/2016 0335   CBG (last 3)   Recent Labs  08/15/16 1258  GLUCAP 110*    Meds Scheduled Meds: . bisacodyl  10 mg Oral Daily  . carvedilol  6.25 mg Oral BID WC  . folic acid  1 mg Oral Daily  . levalbuterol  0.63 mg Nebulization BID  . pneumococcal 23 valent vaccine  0.5 mL Intramuscular Tomorrow-1000  . potassium chloride  20 mEq Oral Once  . rosuvastatin   10 mg Oral q1800  . sacubitril-valsartan  1 tablet Oral BID  . senna-docusate  1 tablet Oral QHS   Continuous Infusions: . potassium chloride     PRN Meds:.acetaminophen, fentaNYL (SUBLIMAZE) injection, ondansetron (ZOFRAN) IV, oxyCODONE, potassium chloride, traMADol  Xrays Dg Chest Port 1 View  Result Date: 08/17/2016 CLINICAL DATA:  Left chest wall hematoma EXAM: PORTABLE CHEST 1 VIEW COMPARISON:  Two days ago FINDINGS: A drain is present over the left chest wall. There is haziness of the left more than right chest consistent with pleural fluid, decreasing or inferiorly accumulating. The superimposed left lung is obscured at the base. No pneumothorax or pulmonary edema. Known left rib fractures. Cardiomegaly with left ventricular apical calcification. IMPRESSION: 1. Decreased or inferiorly accumulating small pleural effusions on this upright film. 2. Presumed retrocardiac atelectasis. 3. Left ventricular apex calcification which may reflect aneurysm in this patient with LV thrombus. Electronically Signed   By: Monte Fantasia M.D.   On: 08/17/2016 07:41    Assessment/Plan: S/P Procedure(s) (LRB): INSERTION OF DRAIN ANTERIOR LEFT CHEST WALL HEMATOMA (Left)  1 50 ml from drain yesterday, leave for now- some clot in drain improved flow after milking tube 2 cont medical management per primary/cardiology  LOS: 14 days  GOLD,WAYNE E 08/18/2016 patient examined and medical record reviewed,agree with above note. Tharon Aquas Trigt III 08/18/2016

## 2016-08-18 NOTE — Progress Notes (Signed)
Progress Note  Patient Name: [name] [name] Date of Encounter: 08/18/2016  Primary Cardiologist: New  Subjective   Feeling good this am. Denies SOB. Chest still sore. JP drain in place. Drainage decreasing. Anxious to go home.   Out 900 cc and down 3 lbs.   Left/Right heart cath 08/13/16  Ost RCA lesion, 80 %stenosed.  Ost LM lesion, 40 %stenosed.  Ost Ramus to Ramus lesion, 80 %stenosed.  Ost LAD to Prox LAD lesion, 70 %stenosed.  Ost 1st Diag to 1st Diag lesion, 75 %stenosed.  Mid LAD to Dist LAD lesion, 40 %stenosed.   Findings:  Ao = 141/70 (102) LV =  126/13 RA =  5 RV = 29/8 PA = 33/9 (22) PCW = 12 Fick cardiac output/index = 5.3/3.0 PVR = 1.9 WU Ao sat = 92%  PA sat = 55%,54%   Inpatient Medications    Scheduled Meds: . bisacodyl  10 mg Oral Daily  . carvedilol  6.25 mg Oral BID WC  . folic acid  1 mg Oral Daily  . levalbuterol  0.63 mg Nebulization BID  . pneumococcal 23 valent vaccine  0.5 mL Intramuscular Tomorrow-1000  . potassium chloride  20 mEq Oral Once  . rosuvastatin  10 mg Oral q1800  . sacubitril-valsartan  1 tablet Oral BID  . senna-docusate  1 tablet Oral QHS   Continuous Infusions: . potassium chloride     PRN Meds: acetaminophen, fentaNYL (SUBLIMAZE) injection, ondansetron (ZOFRAN) IV, oxyCODONE, potassium chloride, traMADol   Vital Signs    Vitals:   08/17/16 1247 08/17/16 2021 08/17/16 2037 08/18/16 0600  BP: (!) 124/57 132/61    Pulse: 68 78    Resp: 18 18    Temp: 98.1 F (36.7 C) 98.2 F (36.8 C)    TempSrc: Oral Oral    SpO2: 96% 99% 99%   Weight:    128 lb 9.6 oz (58.3 kg)  Height:        Intake/Output Summary (Last 24 hours) at 08/18/16 0735 Last data filed at 08/18/16 0600  Gross per 24 hour  Intake             1130 ml  Output             2070 ml  Net             -940 ml   Filed Weights   08/16/16 0408 08/17/16 0500 08/18/16 0600  Weight: 134 lb 6.4 oz (61 kg) 131 lb (59.4 kg) 128 lb 9.6 oz  (58.3 kg)    Telemetry   Personally reviewed, NSR 70-80s     Physical Exam   General: Well appearing. No resp difficulty. HEENT: Normal Neck: Supple. JVP does not appear elevated. Carotids 2+ bilat; no bruits. No thyromegaly or nodule noted. Cor: PMI nondisplaced. RRR, No M/G/R noted Lungs: CTAB, normal effort. Abdomen: Soft, non-tender, non-distended, no HSM. No bruits or masses. +BS. Incisions healing. Extremities: No cyanosis, clubbing, rash, R and LLE no edema.  Neuro: Alert & orientedx3, cranial nerves grossly intact. moves all 4 extremities w/o difficulty. Affect pleasant  Skin: L flank/LUE ecchymotic.   Labs    Chemistry  Recent Labs Lab 08/13/16 0259  08/16/16 0037 08/16/16 0351 08/17/16 0403  NA 132*  < > 132* 134* 137  K 4.1  < > 5.1 4.3 3.7  CL 105  < > 105 106 106  CO2 23  < > 23 23 23   GLUCOSE 123*  < > 171* 202* 116*  BUN 9  < > 13 11 13   CREATININE 0.75  < > 0.81 0.82 0.76  CALCIUM 7.5*  < > 8.1* 8.0* 8.2*  PROT 4.2*  --   --   --  4.7*  ALBUMIN 2.0*  --   --   --  2.2*  AST 44*  --   --   --  48*  ALT 33  --   --   --  54  ALKPHOS 40  --   --   --  59  BILITOT 1.6*  --   --   --  1.1  GFRNONAA >60  < > >60 >60 >60  GFRAA >60  < > >60 >60 >60  ANIONGAP 4*  < > 4* 5 8  < > = values in this interval not displayed.   Hematology  Recent Labs Lab 08/15/16 0445 08/16/16 0351 08/17/16 0403  WBC 10.6* 12.4* 11.5*  RBC 3.01* 2.94* 2.91*  HGB 9.5* 9.0* 9.0*  HCT 28.3* 27.7* 27.9*  MCV 94.0 94.2 95.9  MCH 31.6 30.6 30.9  MCHC 33.6 32.5 32.3  RDW 17.1* 16.7* 16.7*  PLT 357 467* 492*    Cardiac Enzymes No results for input(s): TROPONINI in the last 168 hours.  No results for input(s): TROPIPOC in the last 168 hours.    Radiology    Dg Chest Port 1 View  Result Date: 08/17/2016 CLINICAL DATA:  Left chest wall hematoma EXAM: PORTABLE CHEST 1 VIEW COMPARISON:  Two days ago FINDINGS: A drain is present over the left chest wall. There is haziness  of the left more than right chest consistent with pleural fluid, decreasing or inferiorly accumulating. The superimposed left lung is obscured at the base. No pneumothorax or pulmonary edema. Known left rib fractures. Cardiomegaly with left ventricular apical calcification. IMPRESSION: 1. Decreased or inferiorly accumulating small pleural effusions on this upright film. 2. Presumed retrocardiac atelectasis. 3. Left ventricular apex calcification which may reflect aneurysm in this patient with LV thrombus. Electronically Signed   By: Monte Fantasia M.D.   On: 08/17/2016 07:41    Cardiac Studies   Transthoracic Echocardiogram  08/06/2016  Study Conclusions  - Left ventricle: The cavity size was normal. There was mild focal   basal hypertrophy of the septum. Systolic function was moderately   reduced. The estimated ejection fraction was in the range of 35%   to 40%. There is akinesis of the apical myocardium. There is   akinesis of the apicalanterior, lateral, inferolateral, and   inferior myocardium. There is akinesis of the midanteroseptal   myocardium. There was an increased relative contribution of   atrial contraction to ventricular filling. Doppler parameters are   consistent with abnormal left ventricular relaxation (grade 1   diastolic dysfunction). There was an apparent, medium-sized,   apicalthrombus. - Pulmonary arteries: Systolic pressure could not be accurately   estimated. - Pericardium, extracardiac: There was a large left pleural   effusion.   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 CT chest also showed atherosclerosis and mildly displaced anterior left second rib fracture as well as stable probable nondisplaced fracture involving T8 vertebral body. Echo with EF 35-40% with LV thrombus  Developed large chet wall hematoma on 7/27 in  setting of AC and fall.   Assessment & Plan     1. Cardiac ArrestVT/VF:  --Suspect primary event  was VT/VF arrest in setting of known ischemic CM (MI in late 80-90. Cared for by Dr. Doreatha Lew. No f/u since. Reports cath at that time with what sounds like POBA) - suspect perforated stomach and rib fx sequale from CPR -ECHO EF 35-40%. - Keep K .4.0 Mg > 2.0. BMET and Mg pending today.  - s/p evacuation of chest hematoma. Awaiting approval for discharge from surgical team re: drain removal.  - Will need Lifevest on discharge. Plan for ICD placement in 4-6 weeks per EP.  - Vitals stable. Await labs to consider med titration.     2. CAD with ischemic CM - Cath this admit with 3V disease (mostly non-obstructive)  medical therapy. If develops angina can consider PCI to Ramus.  - No s/s ischemia - Volume status stable on exam.   - Continue statin and ASA.  - Continue entresto 49/51 mg BID.  - Continue coreg 6.25 mg BID.  - Continue spiro to 25 mg daily.      3. Perforated stomach - s/p repair. Suspect due to CPR. Cannot exclude PUD though recent EGD ok.  - No change. Tolerating diet.    4. Frequent PVCs - Much improved with b-blocker.  - Continue Coreg 6.25 mg BID.  - Keep potassium > 4 and Magnesium > 2. Will order labs for today.    5. LV Thrombus:  - Has been off since 7/28 due to fall and chest wall hematoma.   - SCDs for DVT prophylaxis - No change..   6. Chest wall hematoma with symptomatic anemia -s/p 1 unit PRBC's on 7/29 - Hbg 9.0 yesterday. Labs pending.    7. Hypokalemia/hypomagnesemia:  - Labs pending.   8. Acute delirium - Resolved  9. ETOH and tobacco abuse - has mild cirrhosis on previous CT.  - has cut back ETOH but hasn't quit - Counseled on need for cessation -  Watch for withdrawal. On folate and thiamine - No change. .   10. Suspected Fall 08/08/16-  - left arm swollen with mildly decreased ROM> Xray L humerus --no fracture.  -Elevate LUE.  -  Improved.   Lifevest has been fit. Awaiting surgical team recommendation for home re: JP drain. Will need close follow up.   Shirley Friar, PA-C  08/18/2016, 7:35 AM    Advanced Heart Failure Team Pager [phone] (M-F; 7a - 4p)  Please contact [facility] for night-coverage after hours (4p -7a ) and weekends on [email or web address]   Patient seen and examined with the above-signed Advanced Practice Provider and/or Housestaff. I personally reviewed laboratory data, imaging studies and relevant notes. I independently examined the patient and formulated the important aspects of the plan. I have edited the note to reflect any of my changes or salient points. I have personally discussed the plan with the patient and/or family.  Looks much better after drainage of chest wall hematoma. Very anxious to go home.   Able to walk unit. HF is stable. No angina. Rhythm stable. Has been fitted for LifeVest.   Stable for d/c from our standpoint once cleared by TCTS. Will plan ICD implant in 2-3 months.  Glori Bickers, MD  8:58 PM

## 2016-08-19 LAB — BASIC METABOLIC PANEL
Anion gap: 6 (ref 5–15)
BUN: 10 mg/dL (ref 6–20)
CHLORIDE: 106 mmol/L (ref 101–111)
CO2: 25 mmol/L (ref 22–32)
CREATININE: 0.58 mg/dL — AB (ref 0.61–1.24)
Calcium: 8.6 mg/dL — ABNORMAL LOW (ref 8.9–10.3)
GFR calc Af Amer: >60 mL/min (ref >60)
GFR calc non Af Amer: >60 mL/min (ref >60)
Glucose, Bld: 109 mg/dL — ABNORMAL HIGH (ref 65–99)
Potassium: 3.9 mmol/L (ref 3.5–5.1)
Sodium: 137 mmol/L (ref 135–145)

## 2016-08-19 LAB — MAGNESIUM: MAGNESIUM: 2.2 mg/dL (ref 1.7–2.4)

## 2016-08-19 MED ORDER — ASPIRIN EC 81 MG PO TBEC: 81.0000 mg | DELAYED_RELEASE_TABLET | Freq: Every day | ORAL | 0 refills | Status: DC

## 2016-08-19 MED ORDER — ACETAMINOPHEN 325 MG PO TABS: 650.0000 mg | ORAL_TABLET | ORAL | Status: AC | PRN

## 2016-08-19 MED ORDER — SPIRONOLACTONE 25 MG PO TABS: 25.0000 mg | ORAL_TABLET | Freq: Every day | ORAL | 0 refills | Status: DC

## 2016-08-19 MED ORDER — FOLIC ACID 1 MG PO TABS: 1.0000 mg | ORAL_TABLET | Freq: Every day | ORAL | 0 refills | Status: DC

## 2016-08-19 MED ORDER — SACUBITRIL-VALSARTAN 49-51 MG PO TABS: 1.0000 | ORAL_TABLET | Freq: Two times a day (BID) | ORAL | 0 refills | Status: DC

## 2016-08-19 MED ORDER — CARVEDILOL 6.25 MG PO TABS: 6.2500 mg | ORAL_TABLET | Freq: Two times a day (BID) | ORAL | 0 refills | Status: DC

## 2016-08-19 MED ORDER — MAGNESIUM OXIDE 400 (241.3 MG) MG PO TABS: 400.0000 mg | ORAL_TABLET | Freq: Every day | ORAL | 0 refills | Status: DC

## 2016-08-19 NOTE — Progress Notes (Signed)
Progress Note  Patient Name: [name] [name] Date of Encounter: 08/19/2016  Primary Cardiologist: New  Subjective   Feeling good today. Denies SOB or CP. Anxious to go home. Wife frustrated he is still in hospital.   Out 1.5 L and down 2 more lbs.   Left/Right heart cath 08/13/16  Ost RCA lesion, 80 %stenosed.  Ost LM lesion, 40 %stenosed.  Ost Ramus to Ramus lesion, 80 %stenosed.  Ost LAD to Prox LAD lesion, 70 %stenosed.  Ost 1st Diag to 1st Diag lesion, 75 %stenosed.  Mid LAD to Dist LAD lesion, 40 %stenosed.   Findings:  Ao = 141/70 (102) LV =  126/13 RA =  5 RV = 29/8 PA = 33/9 (22) PCW = 12 Fick cardiac output/index = 5.3/3.0 PVR = 1.9 WU Ao sat = 92%  PA sat = 55%,54%   Inpatient Medications    Scheduled Meds: . bisacodyl  10 mg Oral Daily  . carvedilol  6.25 mg Oral BID WC  . folic acid  1 mg Oral Daily  . levalbuterol  0.63 mg Nebulization BID  . magnesium oxide  400 mg Oral Daily  . pneumococcal 23 valent vaccine  0.5 mL Intramuscular Tomorrow-1000  . potassium chloride  20 mEq Oral Once  . rosuvastatin  10 mg Oral q1800  . sacubitril-valsartan  1 tablet Oral BID  . senna-docusate  1 tablet Oral QHS   Continuous Infusions: . potassium chloride     PRN Meds: acetaminophen, fentaNYL (SUBLIMAZE) injection, ondansetron (ZOFRAN) IV, oxyCODONE, potassium chloride, traMADol   Vital Signs    Vitals:   08/18/16 0808 08/18/16 1251 08/18/16 2100 08/19/16 0652  BP:  (!) 116/57 136/77 116/76  Pulse:  72 81 77  Resp:  18 18 18   Temp:  98.2 F (36.8 C) 97.6 F (36.4 C) (!) 97.5 F (36.4 C)  TempSrc:  Oral Oral Oral  SpO2: 96% 96% 99% 98%  Weight:    126 lb 4.8 oz (57.3 kg)  Height:        Intake/Output Summary (Last 24 hours) at 08/19/16 0754 Last data filed at 08/19/16 0653  Gross per 24 hour  Intake              480 ml  Output             1980 ml  Net            -1500 ml   Filed Weights   08/17/16 0500 08/18/16 0600 08/19/16  0652  Weight: 131 lb (59.4 kg) 128 lb 9.6 oz (58.3 kg) 126 lb 4.8 oz (57.3 kg)    Telemetry   Personally reviewed, NSR 70-80s   Physical Exam   General: Well appearing. No resp difficulty. HEENT: Normal Neck: Supple. JVP does not appear elevated. Carotids 2+ bilat; no bruits. No thyromegaly or nodule noted. Cor: PMI nondisplaced. RRR, No M/G/R noted Lungs: CTAB, normal effort. Abdomen: Soft, non-tender, non-distended, no HSM. No bruits or masses. +BS . Incisions well healing. Extremities: No cyanosis, clubbing, rash, R and LLE no edema.  Neuro: Alert & orientedx3, cranial nerves grossly intact. moves all 4 extremities w/o difficulty. Affect pleasant  Skin: L flank/LUE ecchymotic.   Labs    Chemistry  Recent Labs Lab 08/13/16 0259  08/17/16 0403 08/18/16 0823 08/19/16 0208  NA 132*  < > 137 136 137  K 4.1  < > 3.7 3.8 3.9  CL 105  < > 106 106 106  CO2 23  < >  23 23 25   GLUCOSE 123*  < > 116* 151* 109*  BUN 9  < > 13 10 10   CREATININE 0.75  < > 0.76 0.65 0.58*  CALCIUM 7.5*  < > 8.2* 8.5* 8.6*  PROT 4.2*  --  4.7*  --   --   ALBUMIN 2.0*  --  2.2*  --   --   AST 44*  --  48*  --   --   ALT 33  --  54  --   --   ALKPHOS 40  --  59  --   --   BILITOT 1.6*  --  1.1  --   --   GFRNONAA >60  < > >60 >60 >60  GFRAA >60  < > >60 >60 >60  ANIONGAP 4*  < > 8 7 6   < > = values in this interval not displayed.   Hematology  Recent Labs Lab 08/16/16 0351 08/17/16 0403 08/18/16 0823  WBC 12.4* 11.5* 9.6  RBC 2.94* 2.91* 3.22*  HGB 9.0* 9.0* 9.8*  HCT 27.7* 27.9* 30.7*  MCV 94.2 95.9 95.3  MCH 30.6 30.9 30.4  MCHC 32.5 32.3 31.9  RDW 16.7* 16.7* 16.6*  PLT 467* 492* 524*    Cardiac Enzymes No results for input(s): TROPONINI in the last 168 hours.  No results for input(s): TROPIPOC in the last 168 hours.    Radiology    No results found.  Cardiac Studies   Transthoracic Echocardiogram  08/06/2016  Study Conclusions  - Left ventricle: The cavity size  was normal. There was mild focal   basal hypertrophy of the septum. Systolic function was moderately   reduced. The estimated ejection fraction was in the range of 35%   to 40%. There is akinesis of the apical myocardium. There is   akinesis of the apicalanterior, lateral, inferolateral, and   inferior myocardium. There is akinesis of the midanteroseptal   myocardium. There was an increased relative contribution of   atrial contraction to ventricular filling. Doppler parameters are   consistent with abnormal left ventricular relaxation (grade 1   diastolic dysfunction). There was an apparent, medium-sized,   apicalthrombus. - Pulmonary arteries: Systolic pressure could not be accurately   estimated. - Pericardium, extracardiac: There was a large left pleural   effusion.   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 CT chest also showed atherosclerosis and mildly displaced anterior left second rib fracture as well as stable probable nondisplaced fracture involving T8 vertebral body. Echo with EF 35-40% with LV thrombus  Developed large chet wall hematoma on 7/27 in setting of AC and fall.   Assessment & Plan     1. Cardiac Arrest VT/VF:  --Suspect primary event was VT/VF arrest in setting of known ischemic CM (MI in late 80-90. Cared for by Dr. Doreatha Lew. No f/u since. Reports cath at that time with what sounds like POBA) - suspect perforated stomach and rib fx sequale from CPR -ECHO EF 35-40%. - Keep K .4.0 Mg > 2.0. Received 3 g Mg yesterday.   - s/p evacuation of chest hematoma. Awaiting approval for discharge from surgical team re: drain removal.  - Has been fitted for Lifevest on discharge. Plan for ICD placement in 4-6 weeks per EP.    2. CAD with ischemic CM - Cath this admit with 3V disease (mostly non-obstructive)  medical therapy. If  develops angina can consider PCI to Ramus.  - No s/s ischemia - Volume status looks stable on exam.    - Continue statin and ASA.  - Continue entresto 49/51 mg BID.  - Continue coreg 6.25 mg BID.  - Continue spiro 25 mg daily.     - Stable on current meds. Will not aggressively lower BP with suspected fall this admit.   3. Perforated stomach - s/p repair. Suspect due to CPR. Cannot exclude PUD though recent EGD ok.  - No change. Tolerating diet thus far.   4. Frequent PVCs - Much improved with b-blocker.  - Continue Coreg 6.25 mg BID.  - Keep potassium > 4 and Magnesium > 2. Mg pending today.   5. LV Thrombus:  - Has been off anticoagulant since 7/28 due to fall and chest wall hematoma.   - SCDs for DVT prophylaxis - No change.    6. Chest wall hematoma with symptomatic anemia -s/p 1 unit PRBC's on 7/29 - Hbg 9.8 yesterday.   7. Hypokalemia/hypomagnesemia:  - K 3.9. Mg 1.7 08/18/16. Got 3 g of magnesium yesterday.     8. Acute delirium - Resolved.  9. ETOH and tobacco abuse - has mild cirrhosis on previous CT.  - has cut back ETOH but hasn't quit - Counseled on need for cessation - Watch for withdrawal. On folate and thiamine - No change.    10. Suspected Fall 08/08/16-  - left arm swollen with mildly decreased ROM> Xray L humerus --no fracture.  -Elevate LUE.  - Improved.    Lifevest has been fit. Stable for d/c once TCTS clears. Plan for ICD in 2-3 months.   HF follow up made for 09/03/16. He sees TCTS in follow up 08/27/16.  Shirley Friar, PA-C  08/19/2016, 7:54 AM    Advanced Heart Failure Team Pager [phone] (M-F; 7a - 4p)  Please contact [facility] for night-coverage after hours (4p -7a ) and weekends on [email or web address]  Patient seen and examined with the above-signed Advanced Practice Provider and/or Housestaff. I personally reviewed laboratory data, imaging studies and relevant notes. I independently examined the patient and formulated the important  aspects of the plan. I have edited the note to reflect any of my changes or salient points. I have personally discussed the plan with the patient and/or family.  Stable from HF standpoint. Rhythm stable. No s/s of ischemia,  JP drain to be removed today. Lifevest has been fit. Continue current meds for CAD and HF.   Ok to d/c home from our standpoint on current medications would add ASA 81mg  daily. F/u HF Clinic.   Glori Bickers, MD  9:33 AM

## 2016-08-19 NOTE — Progress Notes (Signed)
Verified w Butch Penny, clinical liaison [facility] that referral accepted for [facility] and updated that patient will DC to home today.

## 2016-08-19 NOTE — Progress Notes (Addendum)
      [address]       [place] [address]             [phone]      4 Days Post-Op Procedure(s) (LRB): INSERTION OF DRAIN ANTERIOR LEFT CHEST WALL HEMATOMA (Left) Subjective: Upset that he is still in the hospital. He wants his tube out.  Objective: Vital signs in last 24 hours: Temp:  [97.5 F (36.4 C)-98.2 F (36.8 C)] 97.5 F (36.4 C) (08/07 0652) Pulse Rate:  [72-81] 77 (08/07 0652) Cardiac Rhythm: Normal sinus rhythm (08/07 0700) Resp:  [18] 18 (08/07 0652) BP: (116-136)/(57-77) 116/76 (08/07 0652) SpO2:  [96 %-99 %] 98 % (08/07 0652) Weight:  [57.3 kg (126 lb 4.8 oz)] 57.3 kg (126 lb 4.8 oz) (08/07 0652)     Intake/Output from previous day: 08/06 0701 - 08/07 0700 In: 480 [P.O.:480] Out: 1980 [RSWNI:6270; Drains:30] Intake/Output this shift: No intake/output data recorded.  General appearance: alert, cooperative and no distress Heart: regular rate and rhythm, S1, S2 normal, no murmur, click, rub or gallop Lungs: clear to auscultation bilaterally Abdomen: benign Extremities: extremities normal, atraumatic, no cyanosis or edema Wound: clean and dry  Lab Results:  Recent Labs  08/17/16 0403 08/18/16 0823  WBC 11.5* 9.6  HGB 9.0* 9.8*  HCT 27.9* 30.7*  PLT 492* 524*   BMET:  Recent Labs  08/18/16 0823 08/19/16 0208  NA 136 137  K 3.8 3.9  CL 106 106  CO2 23 25  GLUCOSE 151* 109*  BUN 10 10  CREATININE 0.65 0.58*  CALCIUM 8.5* 8.6*    PT/INR: No results for input(s): LABPROT, INR in the last 72 hours. ABG    Component Value Date/Time   PHART 7.449 08/16/2016 0335   HCO3 21.2 08/16/2016 0335   TCO2 23 08/13/2016 1028   ACIDBASEDEF 2.4 (H) 08/16/2016 0335   O2SAT 93.9 08/16/2016 0335   CBG (last 3)  No results for input(s): GLUCAP in the last 72 hours.  Assessment/Plan: S/P Procedure(s) (LRB): INSERTION OF DRAIN ANTERIOR LEFT CHEST WALL HEMATOMA (Left)  1. 30cc/24 hours from drain yesterday. Remove chest wall  drain today, discussed with Dr. Prescott Gum 2. Excellent urine output, weight continued to trend down. 3. Lifevest has been fitted and is at the patient's bedside.   Plan: chest wall drain removed today.  Continue medical management per primary/cardiology. Home soon.     LOS: 15 days    [name] [name] 08/19/2016 Chest wall drain out today Cultures of hematoma are negative Stop antibiotics We'll follow-up chest wall hematoma as outpatient [name] [name] M.D.

## 2016-08-19 NOTE — Progress Notes (Signed)
Pt has orders to be discharged. Discharge instructions given and pt has no additional questions at this time. Medication regimen reviewed and pt educated. Pt verbalized understanding and has no additional questions. Telemetry box removed. IV removed and site in good condition. Pt stable and waiting for transportation. 

## 2016-08-19 NOTE — Op Note (Signed)
NAME:  [name] [name], [name] [name] NO.:  [id number]  MEDICAL RECORD NO.:  [id number]  LOCATION:  [id number]                        FACILITY:  [facility]  PHYSICIAN:  Ivin Poot, M.D.  DATE OF BIRTH:  1950/07/23  DATE OF PROCEDURE:  08/15/2016 DATE OF DISCHARGE:                              OPERATIVE REPORT   OPERATION:  Drainage of left chest hematoma with evacuation of clot.  SURGEON:  Ivin Poot, M.D.  ASSISTANT:  Nicholes Rough PA-C.  ANESTHESIA:  General.  PREOPERATIVE DIAGNOSES:  Left rib fractures with left chest wall hematoma due to systemic anticoagulation with heparin.  POSTOPERATIVE DIAGNOSES:  Left rib fractures with left chest wall hematoma due to systemic anticoagulation with heparin.  DESCRIPTION OF PROCEDURE:  The patient was brought from his hospital room to the preop area, where the procedure was explained to the patient and his wife, and the proper site was marked.  The patient understood the procedure would remove a large hematoma due to rib fractures and systemic anticoagulation, through a small incision and then a drain would be placed.  He understood the risks of further bleeding, infection, and the anesthetic side effects.  He agreed to proceed with surgery.  DESCRIPTION OF PROCEDURE:  The patient was then brought to the operating room and placed supine on the operating table, where general anesthesia was induced.  The left chest was prepped and draped as a sterile field. A proper time-out was performed.  A small 2-inch incision was made centered over the hematoma.  Through this incision, a large amount of clotted blood was removed from the space beneath the pectoralis major, but involving the chest wall and pectoralis minor.  This hematoma was cultured.  The large space was irrigated with warm saline.  The hematoma extended all the way back to the axilla to the left latissimus muscle group.  The hematoma was fully evacuated and a 28-French Bard  catheter was then placed in the bed of the space, tracking back to the left axilla and brought out through a small separate incision, which was closed with a silk suture to secure the drain.  The main incision was then closed with interrupted Vicryl for the muscle layer and a running subcutaneous and skin Vicryl suture.  Sterile dressings were applied.  The patient was reversed from anesthesia, extubated, and returned to the recovery room in stable condition.     Ivin Poot, M.D.     PV/MEDQ  D:  08/19/2016  T:  08/19/2016  Job:  [id number]

## 2016-08-19 NOTE — Discharge Summary (Signed)
Physician Discharge Summary  [name] [name] PYK:[id number] DOB: [birth year] DOA: 08/04/2016  PCP: Patient, No Pcp Per  Admit date: 08/04/2016 Discharge date: 08/19/2016  Time spent: > 35 minutes  Recommendations for Outpatient Follow-up:  1. Please ensure patient follows up with cardiology 2. Monitor potassium and magnesium levels 3. Ensure patient follows up with general surgery after discharge for routine evaluation after operation   Discharge Diagnoses:  Active Problems:   Pneumoperitoneum   Syncope and collapse   Acute respiratory failure (HCC)   Frequent PVCs   Cardiomyopathy, ischemic   Chest wall trauma   Perforated gastric ulcer (HCC)   Tachycardia   Hypotension due to drugs   Benign essential HTN   Labile blood pressure   Pleural effusion   Hematoma of chest wall   S/P evacuation of hematoma   Coronary artery disease involving native coronary artery of native heart without angina pectoris   Chronic systolic heart failure [facility])   Discharge Condition: Stable  Diet recommendation: Heart healthy  Filed Weights   08/17/16 0500 08/18/16 0600 08/19/16 0652  Weight: 59.4 kg (131 lb) 58.3 kg (128 lb 9.6 oz) 57.3 kg (126 lb 4.8 oz)    History of present illness:  Out of hospital Cardiac arrest, s/p cpr/defib, found to have  gi perforation s/p repair, rib fracture with large chest wall hematoma,t8 vertebral fracture, thought all from cpr, has LV thrombus, chf, delirium General surgery, cardiology, EP, thoracic surgery consulted Cardiac cath on 8/1, patient had chest hematoma drained by cardiothoracic surgery 4 days ago  Hospital Course:  Cardiac arrest (presenting symptom):  - Out of hospital VF/VT arrest suspected, witness - unclear etiology. Required 1 shock and 2 - 3 minutes CPR before ROSC. 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 - He was intubated and admitted to icu,   - He has improved , extubated and transferred to hospitalist service on 7/27 - Pt fitted for life vest. Chest hematoma drained, surgical team is cleared patient for discharge  LV thrombus:  heparin drip held on 7/28 due to expanding large left chest wall hematoma - Discharging on baby aspirin  Systolic CHF  -Echo on 5/39 lvef 35-40% with grade 1 diastolic dysfunction -Ct chest on 7/28 "Moderate bilateral pleural effusions are noted with adjacent subsegmental atelectasis of both lower lobes. -cardiology /heart failure team consulted And recommended the following:  CAD with ischemic CM - Cath this admit with 3V disease (mostly non-obstructive)  medical therapy. If develops angina can consider PCI to Ramus.  - No s/s ischemia - Volume status looks stable on exam.    - Continue statin and ASA.  - Continue entresto 49/51 mg BID.  - Continue coreg 6.25 mg BID.  - Continue spiro 25 mg daily.     - Stable on current meds. Will not aggressively lower BP with suspected fall this admit.   Hypokalemia/hypomagnesemia:  - WNL on last check.   PERFORATED STOMACH ULCER vs perforation from CPR -peunoperitoneum on initial ct scan, underwent emergency exploratory laparotomy  EXPLORATORY LAPAROTOMY FREE AIR BIOPSY OF PERFORATED STOMACH ULCER REPAIR OF PERFORATED STOMACH ULCER WITH A OMENTAL PATCH (N/A) on 7/23 - He was on zosyn then unasyn since admission, completed 9 abx as of 7/31 - Patient to follow-up with general surgery after discharge   Large chest wall hematoma, rib fracture, acute blood loss anemia ( RN reported a fall on 7/27, however chest wall  hematoma started prior to the suspected fall)  -Thoracic surgery Dr Lucianne Lei trigt consulted, heparin subsequently discontinued - Patient cleared for discharge by cardiothoracic surgery.   Stable probable nondisplaced fracture involving T8 vertebral body. He denies back pain.  Encephalopathy:  - Resolved  Procedures:  Gastric perforation  repair    chest hematoma drained  Cardiac cath  Consults general surgery Cardiothoracic surgery Cardiologyscharge   Physical Exam: Vitals:   08/19/16 0652 08/19/16 1222  BP: 116/76 114/62  Pulse: 77 72  Resp: 18 18  Temp: (!) 97.5 F (36.4 C) 98.2 F (36.8 C)    General: Pt in nad, alert and awake Cardiovascular: rrr, no rubs Respiratory: no increased wob, no wheezes  Discharge Instructions   Discharge Instructions    Call MD for:  severe uncontrolled pain    Complete by:  As directed    Call MD for:  temperature >100.4    Complete by:  As directed    Diet - low sodium heart healthy    Complete by:  As directed    Increase activity slowly    Complete by:  As directed      Current Discharge Medication List    START taking these medications   Details  acetaminophen (TYLENOL) 325 MG tablet Take 2 tablets (650 mg total) by mouth every 4 (four) hours as needed for mild pain or headache.    aspirin EC 81 MG tablet Take 1 tablet (81 mg total) by mouth daily. Qty: 30 tablet, Refills: 0    carvedilol (COREG) 6.25 MG tablet Take 1 tablet (6.25 mg total) by mouth 2 (two) times daily with a meal. Qty: 60 tablet, Refills: 0    folic acid (FOLVITE) 1 MG tablet Take 1 tablet (1 mg total) by mouth daily. Qty: 30 tablet, Refills: 0    magnesium oxide (MAG-OX) 400 (241.3 Mg) MG tablet Take 1 tablet (400 mg total) by mouth daily. Qty: 30 tablet, Refills: 0    sacubitril-valsartan (ENTRESTO) 49-51 MG Take 1 tablet by mouth 2 (two) times daily. Qty: 60 tablet, Refills: 0    spironolactone (ALDACTONE) 25 MG tablet Take 1 tablet (25 mg total) by mouth daily. Qty: 30 tablet, Refills: 0      CONTINUE these medications which have NOT CHANGED   Details  rosuvastatin (CRESTOR) 10 MG tablet Take 10 mg by mouth daily.      STOP taking these medications     losartan (COZAAR) 100 MG tablet      metoprolol succinate (TOPROL-XL) 50 MG 24 hr tablet        No Known  Allergies Follow-up Information    Georganna Skeans, MD. Call on 09/03/2016.   Specialty:  General Surgery Why:  follow-up @ 8:30am Contact information: [place] [address] [phone]        Ivin Poot, MD Follow up on 08/27/2016.   Specialty:  Cardiothoracic Surgery Why:  PA/LAT CXR to be taken (at [facility] which is in the same building as Dr. Lucianne Lei Trigt's office) on 08/27/2016 at  11:30 am and appointment time is at 12:00 pm Contact information: [place] [address] [phone]        Baldwin Jamaica, PA-C Follow up on 09/01/2016.   Specialty:  Cardiology Why:  8:00AM Contact information: [address] [place] [address] [phone]        Maurice Follow  up on 09/03/2016.   Specialty:  Cardiology Why:  at 2 pm for post hospital follow up. Please bring all of your medications to your visit. The code for parking is 7001. Enter thru [address] off of [address]. Underground parking on your right. Can also park in lower ED lot and enter blue awning.  Contact information: [address] [phone] [place], [facility] Follow up.   Why:  For home health services.  Contact information: [address] [place] [address] [phone]            The results of significant diagnostics from this hospitalization (including imaging, microbiology, ancillary and laboratory) are listed below for reference.    Significant Diagnostic Studies: Ct Abdomen Pelvis Wo Contrast  Addendum Date: 08/04/2016   ADDENDUM REPORT: 08/04/2016 17:57 ADDENDUM: Imaging findings and results were discussed with Dr. Georganna Skeans upon completion of the study by Dr. Laurence Ferrari. Electronically Signed   By: Donavan Foil M.D.   On: 08/04/2016 17:57   Result Date:  08/04/2016 CLINICAL DATA:  Collapsed in [employer], post CPR EXAM: CT CHEST, ABDOMEN AND PELVIS WITHOUT CONTRAST TECHNIQUE: Multidetector CT imaging of the chest, abdomen and pelvis was performed following the standard protocol without IV contrast. COMPARISON:  Radiograph 08/04/2016 FINDINGS: CT CHEST FINDINGS Cardiovascular: Limited evaluation without intravenous contrast. Aortic atherosclerosis. Coronary artery calcification. 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 Small amount of pericardial effusion. Mild cardiomegaly. Mediastinum/Nodes: Endotracheal tube tip terminates several cm above the carina. No thyroid mass. Esophageal tube in the esophagus with the tip terminating near the gastric outlet. Nonspecific subcentimeter mediastinal lymph nodes. Lungs/Pleura: No pneumothorax. Mild emphysema. Mild bronchiectasis within the right greater than left upper lobes. No focal consolidation or pleural effusion. Musculoskeletal: Motion artifact causes false appearance of inferior sternal fracture. Irregular linear lucency through the anterior aspect of T8 vertebral body, without posterior extension, this may extend to the inferior endplate at T8. Degenerative changes. CT ABDOMEN PELVIS FINDINGS Hepatobiliary: No focal liver abnormality is seen. No gallstones, gallbladder wall thickening, or biliary dilatation. Pancreas: Unremarkable. No pancreatic ductal dilatation or surrounding inflammatory changes. Spleen: Normal in size without focal abnormality. Adrenals/Urinary Tract: Adrenal glands are unremarkable. Kidneys are normal, without renal calculi, focal lesion, or hydronephrosis. Bladder is unremarkable. Stomach/Bowel: Esophageal tube tip in the stomach. Contrast material present within the stomach and proximal small bowel. Small focal defect within the anterior aspect of the stomach/lesser curvature, series 3, image number 56 with leakage of contrast around this defect. No  significant bowel wall thickening. Vascular/Lymphatic: Aortic atherosclerosis. Non aneurysmal aorta. Scattered subcentimeter mesenteric lymph nodes. Reproductive: Prominent prostate gland. Other: Massive pneumoperitoneum. Free intraperitoneal contrast within the left abdomen. Musculoskeletal: Degenerative changes. No acute or suspicious bone lesion. IMPRESSION: 1. Massive pneumoperitoneum with free extravasation of contrast in the left hemiabdomen, consistent with viscus perforation. Apparent source is a defect within the anterior aspect/lesser curvature of the stomach. 2. Linear lucency within the T8 vertebral body is suspicious for nondisplaced fracture, likely acute. 3. Mild emphysema Critical Value/emergent results were discussed with the surgeon caring for the patient by Dr. Laurence Ferrari at the time the scan was completed. A call was placed to the emergency department at 5:46 p.m., patient is reportedly in the OR. Electronically Signed: By: Donavan Foil M.D. On: 08/04/2016 17:47   Ct Head Wo Contrast  Result Date: 08/04/2016 CLINICAL DATA:  66 year old male status post cardiac arrest and  collapse at [employer] earlier today. Patient is status post CPR. EXAM: CT HEAD WITHOUT CONTRAST CT CERVICAL SPINE WITHOUT CONTRAST TECHNIQUE: Multidetector CT imaging of the head and cervical spine was performed following the standard protocol without intravenous contrast. Multiplanar CT image reconstructions of the cervical spine were also generated. COMPARISON:  None. FINDINGS: CT HEAD FINDINGS Brain: Negative for acute intracranial hemorrhage, acute infarction, mass, mass effect, hydrocephalus or midline shift. Gray-white differentiation is preserved throughout. Mild cerebral cortical atrophy. Small bilateral remote cerebellar lacunar infarcts. Vascular: No hyperdense vessel or unexpected calcification. Skull: Normal. Negative for fracture or focal lesion. Sinuses/Orbits: No acute finding. Other: None. CT CERVICAL SPINE  FINDINGS Alignment: Normal. Skull base and vertebrae: No acute cervical spine fracture. There is a nondisplaced fracture through the posterior aspect of the left first rib. No primary bone lesion or focal pathologic process. Soft tissues and spinal canal: No prevertebral fluid or swelling. No visible canal hematoma. Disc levels: Multilevel cervical spondylosis. Degenerative changes are most notable at C4-C5, C5-C6 and C6-C7. Right-sided facet arthropathy at C3-C4. Left-sided facet arthropathy at C2-C3, C3-C4, C4-C5 and C7-T1. Upper chest: Negative. Other: The patient is intubated. A gastric tube is also present in the esophagus. IMPRESSION: CT HEAD 1. No acute intracranial abnormality. 2. Cerebral cortical atrophy. 3. Remote right cerebellar lacunar infarct. CT CSPINE 1. No evidence of acute cervical fracture or malalignment. 2. Nondisplaced left first rib fracture. 3. Multilevel cervical spondylosis and left greater than right facet arthropathy. Electronically Signed   By: Jacqulynn Cadet M.D.   On: 08/04/2016 17:33   Ct Chest Wo Contrast  Result Date: 08/08/2016 CLINICAL DATA:  Left upper chest wall edema. EXAM: CT CHEST WITHOUT CONTRAST TECHNIQUE: Multidetector CT imaging of the chest was performed following the standard protocol without IV contrast. COMPARISON:  CT scan of August 04, 2016. FINDINGS: Cardiovascular: Atherosclerosis of thoracic aorta is noted without aneurysm formation. Coronary artery calcifications are noted. Minimal pericardial effusion is noted. Mediastinum/Nodes: No enlarged mediastinal or axillary lymph nodes. Thyroid gland, trachea, and esophagus demonstrate no significant findings. Lungs/Pleura: No pneumothorax is noted. Moderate bilateral pleural effusions are noted with adjacent subsegmental atelectasis of the lower lobes. Upper Abdomen: Surgical drain is seen superior to the stomach. Musculoskeletal: Mildly displaced anterior left second rib fracture is noted. Stable probable  nondisplaced fracture involving T8 vertebral body. There is interval development of probable large hematoma measuring 18 x 14 x 8 mm along the anterior portion of the left chest wall. IMPRESSION: Coronary artery calcifications are noted suggesting coronary artery disease. Moderate bilateral pleural effusions are noted with adjacent subsegmental atelectasis of both lower lobes. Mildly displaced anterior left second rib fracture. Stable probable nondisplaced fracture involving T8 vertebral body. Interval development of large hematoma along the anterior portion of the left chest wall. Aortic Atherosclerosis (ICD10-I70.0). Electronically Signed   By: Marijo Conception, M.D.   On: 08/08/2016 17:44   Ct Chest Wo Contrast  Addendum Date: 08/04/2016   ADDENDUM REPORT: 08/04/2016 17:57 ADDENDUM: Imaging findings and results were discussed with Dr. Georganna Skeans upon completion of the study by Dr. Laurence Ferrari. Electronically Signed   By: Donavan Foil M.D.   On: 08/04/2016 17:57   Result Date: 08/04/2016 CLINICAL DATA:  Collapsed in [employer], post CPR EXAM: CT CHEST, ABDOMEN AND PELVIS WITHOUT CONTRAST TECHNIQUE: Multidetector CT imaging of the chest, abdomen and pelvis was performed following the standard protocol without IV contrast. COMPARISON:  Radiograph 08/04/2016 FINDINGS: CT CHEST FINDINGS Cardiovascular: Limited evaluation without intravenous contrast. Aortic atherosclerosis. Coronary  artery calcification. 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 Small amount of pericardial effusion. Mild cardiomegaly. Mediastinum/Nodes: Endotracheal tube tip terminates several cm above the carina. No thyroid mass. Esophageal tube in the esophagus with the tip terminating near the gastric outlet. Nonspecific subcentimeter mediastinal lymph nodes. Lungs/Pleura: No pneumothorax. Mild emphysema. Mild bronchiectasis within the right greater than left upper lobes. No focal consolidation or  pleural effusion. Musculoskeletal: Motion artifact causes false appearance of inferior sternal fracture. Irregular linear lucency through the anterior aspect of T8 vertebral body, without posterior extension, this may extend to the inferior endplate at T8. Degenerative changes. CT ABDOMEN PELVIS FINDINGS Hepatobiliary: No focal liver abnormality is seen. No gallstones, gallbladder wall thickening, or biliary dilatation. Pancreas: Unremarkable. No pancreatic ductal dilatation or surrounding inflammatory changes. Spleen: Normal in size without focal abnormality. Adrenals/Urinary Tract: Adrenal glands are unremarkable. Kidneys are normal, without renal calculi, focal lesion, or hydronephrosis. Bladder is unremarkable. Stomach/Bowel: Esophageal tube tip in the stomach. Contrast material present within the stomach and proximal small bowel. Small focal defect within the anterior aspect of the stomach/lesser curvature, series 3, image number 56 with leakage of contrast around this defect. No significant bowel wall thickening. Vascular/Lymphatic: Aortic atherosclerosis. Non aneurysmal aorta. Scattered subcentimeter mesenteric lymph nodes. Reproductive: Prominent prostate gland. Other: Massive pneumoperitoneum. Free intraperitoneal contrast within the left abdomen. Musculoskeletal: Degenerative changes. No acute or suspicious bone lesion. IMPRESSION: 1. Massive pneumoperitoneum with free extravasation of contrast in the left hemiabdomen, consistent with viscus perforation. Apparent source is a defect within the anterior aspect/lesser curvature of the stomach. 2. Linear lucency within the T8 vertebral body is suspicious for nondisplaced fracture, likely acute. 3. Mild emphysema Critical Value/emergent results were discussed with the surgeon caring for the patient by Dr. Laurence Ferrari at the time the scan was completed. A call was placed to the emergency department at 5:46 p.m., patient is reportedly in the OR. Electronically  Signed: By: Donavan Foil M.D. On: 08/04/2016 17:47   Ct Cervical Spine Wo Contrast  Result Date: 08/04/2016 CLINICAL DATA:  66 year old male status post cardiac arrest and collapse at [employer] earlier today. Patient is status post CPR. EXAM: CT HEAD WITHOUT CONTRAST CT CERVICAL SPINE WITHOUT CONTRAST TECHNIQUE: Multidetector CT imaging of the head and cervical spine was performed following the standard protocol without intravenous contrast. Multiplanar CT image reconstructions of the cervical spine were also generated. COMPARISON:  None. FINDINGS: CT HEAD FINDINGS Brain: Negative for acute intracranial hemorrhage, acute infarction, mass, mass effect, hydrocephalus or midline shift. Gray-white differentiation is preserved throughout. Mild cerebral cortical atrophy. Small bilateral remote cerebellar lacunar infarcts. Vascular: No hyperdense vessel or unexpected calcification. Skull: Normal. Negative for fracture or focal lesion. Sinuses/Orbits: No acute finding. Other: None. CT CERVICAL SPINE FINDINGS Alignment: Normal. Skull base and vertebrae: No acute cervical spine fracture. There is a nondisplaced fracture through the posterior aspect of the left first rib. No primary bone lesion or focal pathologic process. Soft tissues and spinal canal: No prevertebral fluid or swelling. No visible canal hematoma. Disc levels: Multilevel cervical spondylosis. Degenerative changes are most notable at C4-C5, C5-C6 and C6-C7. Right-sided facet arthropathy at C3-C4. Left-sided facet arthropathy at C2-C3, C3-C4, C4-C5 and C7-T1. Upper chest: Negative. Other: The patient is intubated. A gastric tube is also present in the esophagus. IMPRESSION: CT HEAD 1. No acute intracranial abnormality. 2. Cerebral cortical atrophy. 3. Remote right cerebellar lacunar infarct. CT CSPINE 1. No evidence of acute cervical fracture or malalignment. 2. Nondisplaced left first rib  fracture. 3. Multilevel cervical spondylosis and left greater than  right facet arthropathy. Electronically Signed   By: Jacqulynn Cadet M.D.   On: 08/04/2016 17:33   Dg Chest Port 1 View  Result Date: 08/17/2016 CLINICAL DATA:  Left chest wall hematoma EXAM: PORTABLE CHEST 1 VIEW COMPARISON:  Two days ago FINDINGS: A drain is present over the left chest wall. There is haziness of the left more than right chest consistent with pleural fluid, decreasing or inferiorly accumulating. The superimposed left lung is obscured at the base. No pneumothorax or pulmonary edema. Known left rib fractures. Cardiomegaly with left ventricular apical calcification. IMPRESSION: 1. Decreased or inferiorly accumulating small pleural effusions on this upright film. 2. Presumed retrocardiac atelectasis. 3. Left ventricular apex calcification which may reflect aneurysm in this patient with LV thrombus. Electronically Signed   By: Monte Fantasia M.D.   On: 08/17/2016 07:41   Dg Chest Port 1 View  Result Date: 08/15/2016 CLINICAL DATA:  Status post trabeculation of a left chest wall hematoma. EXAM: PORTABLE CHEST 1 VIEW COMPARISON:  Chest CT dated 08/08/2016 and portable chest dated 08/08/2016. FINDINGS: Stable enlarged cardiac silhouette. Increased dense opacity in the left lower lung zone and interval ill-defined opacity in the left perihilar region. Interval mild right basilar ill-defined opacity. Decreased inspiration. Tubing overlying the left chest. Thoracic spine degenerative changes. IMPRESSION: 1. Increased dense atelectasis and possible pneumonia in the left lower lung zone with interval left perihilar pneumonia or patchy atelectasis. 2. Interval mild right basilar atelectasis or pneumonia. Electronically Signed   By: Claudie Revering M.D.   On: 08/15/2016 13:32   Dg Chest Portable 1 View  Result Date: 08/08/2016 CLINICAL DATA:  Respiratory failure EXAM: PORTABLE CHEST 1 VIEW COMPARISON:  08/07/2016 FINDINGS: Left central line remains in place, unchanged. Left lower lobe atelectasis or  infiltrate is stable. Heart is borderline in size. IMPRESSION: No significant change since prior study. Electronically Signed   By: Rolm Baptise M.D.   On: 08/08/2016 07:41   Dg Chest Port 1 View  Result Date: 08/07/2016 CLINICAL DATA:  Status post central line placement EXAM: PORTABLE CHEST 1 VIEW COMPARISON:  08/06/2016 FINDINGS: Cardiac shadow is stable. Left central venous line is noted with catheter tip in the proximal SVC. The catheter is looped just below the skin entry site. The lungs are clear. Endotracheal tube and nasogastric catheter have been removed. Persistent left retrocardiac density is noted. No other focal acute abnormality is seen. IMPRESSION: Central line looped under the skin surface although the catheter tip remains in the proximal SVC. Electronically Signed   By: Inez Catalina M.D.   On: 08/07/2016 21:19   Dg Chest Port 1 View  Result Date: 08/06/2016 CLINICAL DATA:  Respiratory failure.  Endotracheal tube present. EXAM: PORTABLE CHEST 1 VIEW COMPARISON:  08/05/2016; 08/04/2016 FINDINGS: Grossly unchanged cardiac silhouette and mediastinal contours. Stable position of support apparatus. Left basilar consolidative opacities and potential trace left-sided effusion are unchanged. No new focal airspace opacities. No evidence of edema. No pneumothorax. No acute osseus abnormalities. Old right-sided rib fractures. IMPRESSION: 1.  Stable positioning of support apparatus.  No pneumothorax. 2. Unchanged small left-sided effusion and left basilar opacities, atelectasis versus infiltrate. Electronically Signed   By: Sandi Mariscal M.D.   On: 08/06/2016 07:41   Dg Chest Port 1 View  Result Date: 08/05/2016 CLINICAL DATA:  Status post central line placement EXAM: PORTABLE CHEST 1 VIEW COMPARISON:  Film from earlier in the same day. FINDINGS: Left jugular central  line is now seen with the catheter tip in the mid superior vena cava. No pneumothorax is noted. Cardiac shadow is stable. Endotracheal  tube and nasogastric catheter are stable as well as left upper quadrant surgical drain. Minimal small left pleural effusion is again noted. IMPRESSION: No pneumothorax following central line placement. The remainder the exam is stable. Electronically Signed   By: Inez Catalina M.D.   On: 08/05/2016 18:20   Dg Chest Port 1 View  Result Date: 08/05/2016 CLINICAL DATA:  Respiratory failure A. EXAM: PORTABLE CHEST 1 VIEW COMPARISON:  Chest x-ray and chest CT scan of August 04, 2016 FINDINGS: The lungs are adequately inflated. The retrocardiac lung markings are coarse. There is a trace of pleural fluid on the left. There is no mediastinal shift. The heart and pulmonary vascularity are normal. The endotracheal tube tip lies 5.2 cm above the carina. The esophagogastric tube tip and proximal port project below the inferior margin of the image a left internal jugular venous catheter has its tip projecting at the junction of the internal jugular vein with the left subclavian vein. There is a drainage catheter in place that projects just inferior to the left hemidiaphragm. There are old right lateral rib fractures. IMPRESSION: No pneumoperitoneum is observed today. No pneumothorax or pneumomediastinum is observed. There is probable subsegmental atelectasis in the left lower lobe. The support tubes are in reasonable position. Electronically Signed   By: David  Martinique M.D.   On: 08/05/2016 07:32   Dg Chest Portable 1 View  Result Date: 08/04/2016 CLINICAL DATA:  Cardiorespiratory arrest. Cardiopulmonary resuscitation. EXAM: PORTABLE CHEST 1 VIEW COMPARISON:  None. FINDINGS: Massive pneumoperitoneum. Endotracheal tube tip in satisfactory position approximately 5 cm above the carina. Nasogastric tube courses below the diaphragm into the stomach. Cardiac silhouette moderately enlarged. Lungs clear. Bronchovascular markings normal. Pulmonary vascularity normal. No visible pleural effusions. No pneumothorax. IMPRESSION: 1.  Massive pneumoperitoneum. 2.  Support apparatus satisfactory. 3. Moderate cardiomegaly.  No acute cardiopulmonary disease. I personally telephoned these emergent results at the time of interpretation on 08/04/2016 at 4:15 pm to Dr. Orlie Dakin, who verbally acknowledged these results. Electronically Signed   By: Evangeline Dakin M.D.   On: 08/04/2016 16:18   Dg Humerus Left  Result Date: 08/11/2016 CLINICAL DATA:  Two days of pain and swelling over the left numb status post fall. EXAM: LEFT HUMERUS - 2+ VIEW COMPARISON:  None in PACs FINDINGS: The humerus is subjectively adequately mineralized. There is no acute fracture. The observed portions of the shoulder and elbow appear normal. The soft tissues of the arm exhibit no acute abnormalities. IMPRESSION: There is no acute or significant chronic bony abnormality of the left humerus. Electronically Signed   By: David  Martinique M.D.   On: 08/11/2016 12:33   Dg Duanne Limerick W/water Sol Cm  Result Date: 08/08/2016 CLINICAL DATA:  Perforated esophageal surgery EXAM: WATER SOLUBLE UPPER GI SERIES TECHNIQUE: Single-column upper GI series was performed using water soluble contrast. CONTRAST:  100 cc of Isovue-300 COMPARISON:  None. FLUOROSCOPY TIME:  Fluoroscopy Time:  1 minutes 48 Number of Acquired Spot Images: 1 FINDINGS: Contrast was ingested in easily passed through the esophagus and into the stomach. With the patient in the right lateral orientation the stomach passed from the proximal stomach into the duodenum. Contrast was followed through the duodenum and beyond the level of the ligament of Treitz. No extravasation of contrast material identified. IMPRESSION: 1. No extravasation of contrast material identified to suggest leakage. Electronically Signed  By: Kerby Moors M.D.   On: 08/08/2016 09:05    Microbiology: Recent Results (from the past 240 hour(s))  Aerobic/Anaerobic Culture (surgical/deep wound)     Status: None (Preliminary result)   Collection  Time: 08/15/16 11:57 AM  Result Value Ref Range Status   Specimen Description WOUND CHEST LEFT  Final   Special Requests PATIENT ON FOLLOWING ZINACEF  Final   Gram Stain   Final    RARE WBC PRESENT, PREDOMINANTLY PMN NO ORGANISMS SEEN    Culture   Final    NO GROWTH 3 DAYS NO ANAEROBES ISOLATED; CULTURE IN PROGRESS FOR 5 DAYS   Report Status PENDING  Incomplete     Labs: Basic Metabolic Panel:  Recent Labs Lab 08/14/16 1107 08/15/16 0445 08/16/16 0037 08/16/16 0351 08/17/16 0403 08/18/16 0823 08/19/16 0208 08/19/16 0800  NA  --  133* 132* 134* 137 136 137  --   K  --  3.9 5.1 4.3 3.7 3.8 3.9  --   CL  --  106 105 106 106 106 106  --   CO2  --  23 23 23 23 23 25   --   GLUCOSE  --  135* 171* 202* 116* 151* 109*  --   BUN  --  9 13 11 13 10 10   --   CREATININE  --  0.67 0.81 0.82 0.76 0.65 0.58*  --   CALCIUM  --  8.1* 8.1* 8.0* 8.2* 8.5* 8.6*  --   MG 1.9 1.7  --  2.5*  --  1.7  --  2.2   Liver Function Tests:  Recent Labs Lab 08/13/16 0259 08/17/16 0403  AST 44* 48*  ALT 33 54  ALKPHOS 40 59  BILITOT 1.6* 1.1  PROT 4.2* 4.7*  ALBUMIN 2.0* 2.2*   No results for input(s): LIPASE, AMYLASE in the last 168 hours. No results for input(s): AMMONIA in the last 168 hours. CBC:  Recent Labs Lab 08/14/16 0303 08/15/16 0445 08/16/16 0351 08/17/16 0403 08/18/16 0823  WBC 13.1* 10.6* 12.4* 11.5* 9.6  NEUTROABS  --   --   --   --  6.7  HGB 9.3* 9.5* 9.0* 9.0* 9.8*  HCT 28.0* 28.3* 27.7* 27.9* 30.7*  MCV 94.0 94.0 94.2 95.9 95.3  PLT 303 357 467* 492* 524*   Cardiac Enzymes: No results for input(s): CKTOTAL, CKMB, CKMBINDEX, TROPONINI in the last 168 hours. BNP: BNP (last 3 results) No results for input(s): BNP in the last 8760 hours.  ProBNP (last 3 results) No results for input(s): PROBNP in the last 8760 hours.  CBG:  Recent Labs Lab 08/15/16 1258  GLUCAP 110*    Signed:  Velvet Bathe MD.  [facility] 08/19/2016, 1:57 PM

## 2016-08-19 NOTE — Consult Note (Addendum)
   [facility] CM Inpatient Consult   08/19/2016  [place] 1950-08-09 [id number]  Follow up: Met with patient and wife to follow up on [facility] needs.  Patient has accepted home health care to follow up and is agreeable to General EMMI calls for post hospital follow up. He states follow up in the Advanced HF clinic.  Has Lifevest and ready to discharge and a little irritated per he and his wife says.  No other needs per patient encouraged to find a primary care provider.  Please contact:  Natividad Brood, RN BSN [facility] Liaison  [phone] business mobile phone Toll free office [phone]   Wife states to call [phone] as they DO NOT answer the landline fine anymore.

## 2016-08-20 DIAGNOSIS — Z7982 Long term (current) use of aspirin: Secondary | ICD-10-CM | POA: Diagnosis not present

## 2016-08-20 DIAGNOSIS — Z720 Tobacco use: Secondary | ICD-10-CM | POA: Diagnosis not present

## 2016-08-20 DIAGNOSIS — I10 Essential (primary) hypertension: Secondary | ICD-10-CM | POA: Diagnosis not present

## 2016-08-20 DIAGNOSIS — I2510 Atherosclerotic heart disease of native coronary artery without angina pectoris: Secondary | ICD-10-CM | POA: Diagnosis not present

## 2016-08-20 DIAGNOSIS — S2232XD Fracture of one rib, left side, subsequent encounter for fracture with routine healing: Secondary | ICD-10-CM | POA: Diagnosis not present

## 2016-08-20 DIAGNOSIS — Z8674 Personal history of sudden cardiac arrest: Secondary | ICD-10-CM | POA: Diagnosis not present

## 2016-08-20 DIAGNOSIS — E785 Hyperlipidemia, unspecified: Secondary | ICD-10-CM | POA: Diagnosis not present

## 2016-08-20 DIAGNOSIS — Z48815 Encounter for surgical aftercare following surgery on the digestive system: Secondary | ICD-10-CM | POA: Diagnosis not present

## 2016-08-20 DIAGNOSIS — I513 Intracardiac thrombosis, not elsewhere classified: Secondary | ICD-10-CM | POA: Diagnosis not present

## 2016-08-20 LAB — AEROBIC/ANAEROBIC CULTURE W GRAM STAIN (SURGICAL/DEEP WOUND): Culture: NO GROWTH

## 2016-08-22 DIAGNOSIS — Z8674 Personal history of sudden cardiac arrest: Secondary | ICD-10-CM | POA: Diagnosis not present

## 2016-08-22 DIAGNOSIS — E785 Hyperlipidemia, unspecified: Secondary | ICD-10-CM | POA: Diagnosis not present

## 2016-08-22 DIAGNOSIS — Z7982 Long term (current) use of aspirin: Secondary | ICD-10-CM | POA: Diagnosis not present

## 2016-08-22 DIAGNOSIS — Z720 Tobacco use: Secondary | ICD-10-CM | POA: Diagnosis not present

## 2016-08-22 DIAGNOSIS — I10 Essential (primary) hypertension: Secondary | ICD-10-CM | POA: Diagnosis not present

## 2016-08-22 DIAGNOSIS — Z48815 Encounter for surgical aftercare following surgery on the digestive system: Secondary | ICD-10-CM | POA: Diagnosis not present

## 2016-08-22 DIAGNOSIS — I2510 Atherosclerotic heart disease of native coronary artery without angina pectoris: Secondary | ICD-10-CM | POA: Diagnosis not present

## 2016-08-22 DIAGNOSIS — S2232XD Fracture of one rib, left side, subsequent encounter for fracture with routine healing: Secondary | ICD-10-CM | POA: Diagnosis not present

## 2016-08-22 DIAGNOSIS — I513 Intracardiac thrombosis, not elsewhere classified: Secondary | ICD-10-CM | POA: Diagnosis not present

## 2016-08-25 ENCOUNTER — Other Ambulatory Visit: Payer: Self-pay

## 2016-08-25 DIAGNOSIS — I10 Essential (primary) hypertension: Secondary | ICD-10-CM | POA: Diagnosis not present

## 2016-08-25 DIAGNOSIS — S2232XD Fracture of one rib, left side, subsequent encounter for fracture with routine healing: Secondary | ICD-10-CM | POA: Diagnosis not present

## 2016-08-25 DIAGNOSIS — Z48815 Encounter for surgical aftercare following surgery on the digestive system: Secondary | ICD-10-CM | POA: Diagnosis not present

## 2016-08-25 DIAGNOSIS — Z7982 Long term (current) use of aspirin: Secondary | ICD-10-CM | POA: Diagnosis not present

## 2016-08-25 DIAGNOSIS — Z8674 Personal history of sudden cardiac arrest: Secondary | ICD-10-CM | POA: Diagnosis not present

## 2016-08-25 DIAGNOSIS — I2510 Atherosclerotic heart disease of native coronary artery without angina pectoris: Secondary | ICD-10-CM | POA: Diagnosis not present

## 2016-08-25 DIAGNOSIS — E785 Hyperlipidemia, unspecified: Secondary | ICD-10-CM | POA: Diagnosis not present

## 2016-08-25 DIAGNOSIS — I513 Intracardiac thrombosis, not elsewhere classified: Secondary | ICD-10-CM | POA: Diagnosis not present

## 2016-08-25 DIAGNOSIS — Z720 Tobacco use: Secondary | ICD-10-CM | POA: Diagnosis not present

## 2016-08-26 ENCOUNTER — Other Ambulatory Visit: Payer: Self-pay | Admitting: Cardiothoracic Surgery

## 2016-08-26 DIAGNOSIS — Z9889 Other specified postprocedural states: Secondary | ICD-10-CM

## 2016-08-26 DIAGNOSIS — K668 Other specified disorders of peritoneum: Secondary | ICD-10-CM | POA: Diagnosis not present

## 2016-08-26 DIAGNOSIS — R55 Syncope and collapse: Secondary | ICD-10-CM | POA: Diagnosis not present

## 2016-08-26 DIAGNOSIS — J9600 Acute respiratory failure, unspecified whether with hypoxia or hypercapnia: Secondary | ICD-10-CM | POA: Diagnosis not present

## 2016-08-26 DIAGNOSIS — I493 Ventricular premature depolarization: Secondary | ICD-10-CM | POA: Diagnosis not present

## 2016-08-26 DIAGNOSIS — I5022 Chronic systolic (congestive) heart failure: Secondary | ICD-10-CM | POA: Diagnosis not present

## 2016-08-27 ENCOUNTER — Ambulatory Visit: Payer: PPO | Admitting: Cardiothoracic Surgery

## 2016-09-01 ENCOUNTER — Ambulatory Visit: Payer: PPO | Admitting: Physician Assistant

## 2016-09-02 ENCOUNTER — Ambulatory Visit: Payer: PPO | Admitting: Physician Assistant

## 2016-09-02 ENCOUNTER — Ambulatory Visit
Admission: RE | Admit: 2016-09-02 | Discharge: 2016-09-02 | Disposition: A | Discharge status: Home / Self Care | Payer: PPO | Source: Ambulatory Visit | Attending: Cardiothoracic Surgery | Admitting: Cardiothoracic Surgery

## 2016-09-02 ENCOUNTER — Ambulatory Visit (INDEPENDENT_AMBULATORY_CARE_PROVIDER_SITE_OTHER): Payer: PPO | Admitting: Surgical

## 2016-09-02 VITALS — BP 111/68 | HR 74 | Resp 18 | Ht 68.0 in | Wt 124.6 lb

## 2016-09-02 DIAGNOSIS — S20219A Contusion of unspecified front wall of thorax, initial encounter: Secondary | ICD-10-CM

## 2016-09-02 DIAGNOSIS — Z9889 Other specified postprocedural states: Secondary | ICD-10-CM

## 2016-09-02 DIAGNOSIS — Z09 Encounter for follow-up examination after completed treatment for conditions other than malignant neoplasm: Secondary | ICD-10-CM

## 2016-09-02 DIAGNOSIS — J90 Pleural effusion, not elsewhere classified: Secondary | ICD-10-CM | POA: Diagnosis not present

## 2016-09-02 NOTE — Patient Instructions (Signed)
Continue to increase ambulation and activity as tolerated

## 2016-09-02 NOTE — Progress Notes (Signed)
[address]       ,[place] [id number]             [place] Record #[id number] Date of Birth: 11/03/1950  Referring: Judeth Horn, MD Primary Care: Patient, No Pcp Per  Chief Complaint:   POST OP FOLLOW UP 08/04/2016 - 08/15/2016  12:34 PM  PATIENT:  [name] [name]  66 y.o. male  PRE-OPERATIVE DIAGNOSIS:  LEFT CHEST WALL HEMATOMA  POST-OPERATIVE DIAGNOSIS:  LEFT CHEST WALL HEMATOMA  PROCEDURE:  Procedure(s): INSERTION OF DRAIN ANTERIOR LEFT CHEST WALL HEMATOMA (Left)  SURGEON:  Surgeon(s) and Role:    Ivin Poot, MD - Primary  PHYSICIAN ASSISTANT:  Nicholes Rough, PA-C  History of Present Illness:    The patient is a 66 year old male status post above described procedure. He is seen in the office on today's date in routine follow-up. He reports that he continues to have moderate discomfort at times but is improving. He has not had any difficulties with his incisions. He is having fevers, chills or other constitutional symptoms other than generalized weakness. Overall he feels as though he is making some steady progress.      Past Medical History:  Diagnosis Date  . Coronary artery disease    1980's-90's MI with perhaps angioplasty  . Hypertension      History  Smoking Status  . Current Every Day Smoker  . Packs/day: 1.00  . Years: 25.00  . Types: Cigarettes  Smokeless Tobacco  . Former Systems developer  . Types: Chew    History  Alcohol use Not on file     No Known Allergies  Current Outpatient Prescriptions  Medication Sig Dispense Refill  . acetaminophen (TYLENOL) 325 MG tablet Take 2 tablets (650 mg total) by mouth every 4 (four) hours as needed for mild pain or headache.    Marland Kitchen aspirin EC 81 MG tablet Take 1 tablet (81 mg total) by mouth daily. 30 tablet 0  . carvedilol (COREG) 6.25 MG tablet Take 1 tablet (6.25 mg total) by mouth 2 (two) times daily with a meal. 60 tablet 0    . folic acid (FOLVITE) 1 MG tablet Take 1 tablet (1 mg total) by mouth daily. 30 tablet 0  . magnesium oxide (MAG-OX) 400 (241.3 Mg) MG tablet Take 1 tablet (400 mg total) by mouth daily. 30 tablet 0  . rosuvastatin (CRESTOR) 10 MG tablet Take 10 mg by mouth daily.    . sacubitril-valsartan (ENTRESTO) 49-51 MG Take 1 tablet by mouth 2 (two) times daily. 60 tablet 0  . spironolactone (ALDACTONE) 25 MG tablet Take 1 tablet (25 mg total) by mouth daily. 30 tablet 0   No current facility-administered medications for this visit.        Physical Exam: BP 111/68   Pulse 74   Resp 18   Ht 5\' 8"  (1.727 m)   Wt 124 lb 9.6 oz (56.5 kg)   SpO2 99% Comment: RA  BMI 18.95 kg/m   General appearance: alert, cooperative and no distress Heart: regular rate and rhythm Lungs: clear to auscultation bilaterally Wound: Incisions healing well without evidence of infection. The ecchymosis is dramatically improved.   Diagnostic Studies & Laboratory data:     Recent Radiology Findings:   Dg Chest 2 View  Result Date: 09/02/2016 CLINICAL DATA:  Status post evacuation of chest wall hematoma on the left. EXAM: CHEST  2 VIEW  COMPARISON:  None. FINDINGS: There is a small left pleural effusion with left base scarring versus atelectasis. Lungs elsewhere are clear. Heart is upper normal in size with pulmonary vascularity within normal limits. There is calcification along the anterior wall left ventricle. No pneumothorax. There is degenerative change in the thoracic spine. IMPRESSION: Small left pleural effusion with left base scarring versus atelectasis. No edema or consolidation. Heart upper normal in size. Calcification along the anterior wall left ventricle may be indicative of previous myocardial infarct. No pneumothorax. No focal bone lesions are evident beyond degenerative change in the thoracic spine. Electronically Signed   By: Lowella Grip III M.D.   On: 09/02/2016 15:07      Recent Lab  Findings: Lab Results  Component Value Date   WBC 9.6 08/18/2016   HGB 9.8 (L) 08/18/2016   HCT 30.7 (L) 08/18/2016   PLT 524 (H) 08/18/2016   GLUCOSE 109 (H) 08/19/2016   TRIG 88 08/04/2016   ALT 54 08/17/2016   AST 48 (H) 08/17/2016   NA 137 08/19/2016   K 3.9 08/19/2016   CL 106 08/19/2016   CREATININE 0.58 (L) 08/19/2016   BUN 10 08/19/2016   CO2 25 08/19/2016   INR 1.39 08/09/2016      Assessment / Plan:  Very steady progress in his recovery. Encouraged him to continue to increase activities as tolerated. Still no heavy lifting for now but we will not have to see him again unless requested by other physicians or any new issues  present.          John Giovanni, PA-C 09/02/2016 3:47 PM

## 2016-09-03 ENCOUNTER — Encounter (HOSPITAL_COMMUNITY): Payer: Self-pay

## 2016-09-03 ENCOUNTER — Ambulatory Visit (HOSPITAL_COMMUNITY)
Admit: 2016-09-03 | Discharge: 2016-09-03 | Disposition: A | Discharge status: Home / Self Care | Payer: PPO | Source: Ambulatory Visit | Attending: Internal Medicine | Admitting: Internal Medicine

## 2016-09-03 VITALS — BP 106/60 | HR 71 | Ht 68.0 in | Wt 126.8 lb

## 2016-09-03 DIAGNOSIS — Z801 Family history of malignant neoplasm of trachea, bronchus and lung: Secondary | ICD-10-CM | POA: Diagnosis not present

## 2016-09-03 DIAGNOSIS — Z79899 Other long term (current) drug therapy: Secondary | ICD-10-CM | POA: Insufficient documentation

## 2016-09-03 DIAGNOSIS — I255 Ischemic cardiomyopathy: Secondary | ICD-10-CM | POA: Diagnosis not present

## 2016-09-03 DIAGNOSIS — Z86718 Personal history of other venous thrombosis and embolism: Secondary | ICD-10-CM | POA: Insufficient documentation

## 2016-09-03 DIAGNOSIS — I110 Hypertensive heart disease with heart failure: Secondary | ICD-10-CM | POA: Insufficient documentation

## 2016-09-03 DIAGNOSIS — F17210 Nicotine dependence, cigarettes, uncomplicated: Secondary | ICD-10-CM | POA: Diagnosis not present

## 2016-09-03 DIAGNOSIS — I252 Old myocardial infarction: Secondary | ICD-10-CM | POA: Diagnosis not present

## 2016-09-03 DIAGNOSIS — Z7982 Long term (current) use of aspirin: Secondary | ICD-10-CM | POA: Diagnosis not present

## 2016-09-03 DIAGNOSIS — I469 Cardiac arrest, cause unspecified: Secondary | ICD-10-CM

## 2016-09-03 DIAGNOSIS — I5022 Chronic systolic (congestive) heart failure: Secondary | ICD-10-CM | POA: Diagnosis not present

## 2016-09-03 DIAGNOSIS — N400 Enlarged prostate without lower urinary tract symptoms: Secondary | ICD-10-CM | POA: Insufficient documentation

## 2016-09-03 DIAGNOSIS — S299XXA Unspecified injury of thorax, initial encounter: Secondary | ICD-10-CM

## 2016-09-03 DIAGNOSIS — S20219A Contusion of unspecified front wall of thorax, initial encounter: Secondary | ICD-10-CM | POA: Diagnosis not present

## 2016-09-03 DIAGNOSIS — R42 Dizziness and giddiness: Secondary | ICD-10-CM | POA: Insufficient documentation

## 2016-09-03 DIAGNOSIS — I493 Ventricular premature depolarization: Secondary | ICD-10-CM

## 2016-09-03 DIAGNOSIS — I2510 Atherosclerotic heart disease of native coronary artery without angina pectoris: Secondary | ICD-10-CM | POA: Diagnosis not present

## 2016-09-03 DIAGNOSIS — Z8262 Family history of osteoporosis: Secondary | ICD-10-CM | POA: Diagnosis not present

## 2016-09-03 DIAGNOSIS — Z8674 Personal history of sudden cardiac arrest: Secondary | ICD-10-CM | POA: Insufficient documentation

## 2016-09-03 DIAGNOSIS — E785 Hyperlipidemia, unspecified: Secondary | ICD-10-CM | POA: Diagnosis not present

## 2016-09-03 LAB — MAGNESIUM: MAGNESIUM: 1.8 mg/dL (ref 1.7–2.4)

## 2016-09-03 LAB — BASIC METABOLIC PANEL
ANION GAP: 7 (ref 5–15)
BUN: 7 mg/dL (ref 6–20)
CALCIUM: 9.1 mg/dL (ref 8.9–10.3)
CO2: 27 mmol/L (ref 22–32)
Chloride: 102 mmol/L (ref 101–111)
Creatinine, Ser: 0.8 mg/dL (ref 0.61–1.24)
Glucose, Bld: 117 mg/dL — ABNORMAL HIGH (ref 65–99)
Potassium: 4.7 mmol/L (ref 3.5–5.1)
Sodium: 136 mmol/L (ref 135–145)

## 2016-09-03 LAB — CBC
HCT: 35.6 % — ABNORMAL LOW (ref 39.0–52.0)
Hemoglobin: 11.4 g/dL — ABNORMAL LOW (ref 13.0–17.0)
MCH: 31.1 pg (ref 26.0–34.0)
MCHC: 32 g/dL (ref 30.0–36.0)
MCV: 97 fL (ref 78.0–100.0)
PLATELETS: 426 10*3/uL — AB (ref 150–400)
RBC: 3.67 MIL/uL — AB (ref 4.22–5.81)
RDW: 15.8 % — ABNORMAL HIGH (ref 11.5–15.5)
WBC: 6.9 10*3/uL (ref 4.0–10.5)

## 2016-09-03 NOTE — Patient Instructions (Signed)
Routine lab work today. Will notify you of abnormal results, otherwise no news is good news!  No changed to medication at this time.  Follow up 3 weeks with CHF clinical pharmacist Ileene Patrick.  ___________________________________________________  ___________________________________________________  Follow up with Dr. Haroldine Laws 6-8 weeks.  ___________________________________________________  ___________________________________________________  Take all medication as prescribed the day of your appointment. Bring all medications with you to your appointment.  Do the following things EVERYDAY: 1) Weigh yourself in the morning before breakfast. Write it down and keep it in a log. 2) Take your medicines as prescribed 3) Eat low salt foods-Limit salt (sodium) to 2000 mg per day.  4) Stay as active as you can everyday 5) Limit all fluids for the day to less than 2 liters

## 2016-09-03 NOTE — Progress Notes (Signed)
Advanced Heart Failure Clinic Note   Primary Cardiologist: Dr. Haroldine Laws  EP: Dr. Rayann Heman  HPI  [name] [name] is a 66 y.o. male with history of Systolic CHF, CAD, HTN, HLD, tobacco abuse, and BPH.   Had complicated admission 9/38/10 to 08/19/16 due to out of hospital cardiac arrest c/b cpr/defib and gi perforation thought 2/2 to rib fracture as well as large chest wall hematoma and t8 vertebral fracture thought all from CPR. Noted to have LV thrombus as well.  EP saw and recommended ICD as outpatient with long, complicated admission. Meds adjusted as tolerated. Pt underwent multiple procedures including exploratory laparotomy and repair of perforated stomach with an omental patch by General Surgery and evacuation of chest hematoma by TCTS. Cath as below with 3v disease though mostly non-obstructive, medical management pursued.  Lifevest placed prior to discharge.   He presents today for post hospital follow up. He has been feeling well overall. Much better than admission. He continues to have mild discomfort around his hematoma site. He saw TCTS this am and General surgery yesterday. Denies SOB. No CP other than soreness above.  Denies lightheadedness or dizziness. Denies orthopnea or bendopnea.   Echo 08/06/16 - LVEF 35-40%, grade 1 DD. Apparent medium-sized apical thrombus.   Left/Right heart cath 08/13/16  Ost RCA lesion, 80 %stenosed.  Ost LM lesion, 40 %stenosed.  Ost Ramus to Ramus lesion, 80 %stenosed.  Ost LAD to Prox LAD lesion, 70 %stenosed.  Ost 1st Diag to 1st Diag lesion, 75 %stenosed.  Mid LAD to Dist LAD lesion, 40 %stenosed.  Findings:  Ao = 141/70 (102) LV = 126/13 RA = 5 RV = 29/8 PA = 33/9 (22) PCW = 12 Fick cardiac output/index = 5.3/3.0 PVR = 1.9 WU Ao sat = 92%  PA sat = 55%,54%  Review of systems complete and found to be negative unless listed in HPI.    Past Medical History:  Diagnosis Date  . Coronary artery disease    1980's-90's MI with  perhaps angioplasty  . Hypertension    Current Outpatient Prescriptions  Medication Sig Dispense Refill  . acetaminophen (TYLENOL) 325 MG tablet Take 2 tablets (650 mg total) by mouth every 4 (four) hours as needed for mild pain or headache.    Marland Kitchen aspirin EC 81 MG tablet Take 1 tablet (81 mg total) by mouth daily. 30 tablet 0  . carvedilol (COREG) 6.25 MG tablet Take 1 tablet (6.25 mg total) by mouth 2 (two) times daily with a meal. 60 tablet 0  . folic acid (FOLVITE) 1 MG tablet Take 1 tablet (1 mg total) by mouth daily. 30 tablet 0  . magnesium oxide (MAG-OX) 400 (241.3 Mg) MG tablet Take 1 tablet (400 mg total) by mouth daily. 30 tablet 0  . rosuvastatin (CRESTOR) 10 MG tablet Take 10 mg by mouth daily.    . sacubitril-valsartan (ENTRESTO) 49-51 MG Take 1 tablet by mouth 2 (two) times daily. 60 tablet 0  . spironolactone (ALDACTONE) 25 MG tablet Take 1 tablet (25 mg total) by mouth daily. 30 tablet 0   No current facility-administered medications for this encounter.    No Known Allergies  Social History   Social History  . Marital status: Married    Spouse name: N/A  . Number of children: N/A  . Years of education: N/A   Occupational History  . Not on file.   Social History Main Topics  . Smoking status: Current Every Day Smoker    Packs/day: 1.00  Years: 25.00    Types: Cigarettes  . Smokeless tobacco: Former Systems developer    Types: Chew  . Alcohol use Not on file  . Drug use: No  . Sexual activity: Not Currently   Other Topics Concern  . Not on file   Social History Narrative  . No narrative on file    Family History  Problem Relation Age of Onset  . Osteoporosis Mother   . Lung cancer Father   . Other Brother        died in [place]   Vitals:   10-01-2016 1345  BP: 106/60  Pulse: 71  SpO2: 100%  Weight: 126 lb 12.8 oz (57.5 kg)  Height: 5\' 8"  (1.727 m)   Wt Readings from Last 3 Encounters:  10/01/16 126 lb 12.8 oz (57.5 kg)  09/02/16 124 lb 9.6 oz (56.5 kg)    08/19/16 126 lb 4.8 oz (57.3 kg)    PHYSICAL EXAM: General:  Well appearing. No respiratory difficulty HEENT: normal Neck: supple. JVP does not appear elevated. Carotids 2+ bilat; no bruits. No lymphadenopathy or thyromegaly appreciated. Cor: PMI nondisplaced. Regular rate & rhythm. No rubs, gallops or murmurs. Lungs: clear Abdomen: soft, nontender, nondistended. No hepatosplenomegaly. No bruits or masses. Good bowel sounds. Extremities: no cyanosis, clubbing, rash, edema Neuro: alert & oriented x 3, cranial nerves grossly intact. moves all 4 extremities w/o difficulty. Affect pleasant.  ASSESSMENT & PLAN:  1. CAD with ICM s/p Cardiac Arrest VT/VF:  - Cath 08/13/16 with 3V disease (mostly non-obstructive)  medical therapy. If develops angina can consider PCI to Ramus.  - s/p evacuation of chest hematoma. Awaiting approval for discharge from surgical team re: drain removal.  - Lifevest in place. Sees EP in 2 weeks for ICD plannning.  - No s/s of ischemia. Continue statin and ASA.   2. Chronic systolic HF.  - Echo 4/48/18 - LVEF 35-40%, grade 1 DD. Apparent medium-sized apical thrombus.  - NYHA class II-III symptoms.  - Volume status looks stable on exam.  - Continue entresto 49/51 mg BID.  - Continue coreg 6.25 mg BID.  - Continue spiro 25 mg daily.     - Stable on current meds. Will not aggressively lower BP with suspected fall this admit.   3. Perforated stomach s/p repair. - Suspect due to CPR. Cannot exclude PUD though recent EGD ok.  - Saw general surgery this am. Doing well.   4. Frequent PVCs - Much improved with b-blocker.  - Continue Coreg 6.25 mg BID.  - BMET and Mg today.     5. LV Thrombus:  - taken off heparin gtt with fall and chest wall hematoma in hospital  - SCDs for DVT prophylaxis. Will discuss long term plan for [facility] with MD.   6. Chest wall hematoma s/p drainage - Saw TCTS yesterday. Doing well.  - CBC today.   7. ETOH and tobacco abuse.  -  Encouraged complete cessation of ETOH and tobacco.   Pressures remain soft and occasional lightheadedness. Will not change medications at this time. RTC 3 weeks with Pharm-D for med titration. 6 weeks with MD.   Shirley Friar, PA-C 10/01/2016  Greater than 50% of the 25 minute visit was spent in counseling/coordination of care regarding disease state education, sliding scale diuretics, salt/fluid restriction, and medication reconciliation.

## 2016-09-04 ENCOUNTER — Telehealth (HOSPITAL_COMMUNITY): Payer: Self-pay | Admitting: Student

## 2016-09-04 DIAGNOSIS — I5022 Chronic systolic (congestive) heart failure: Secondary | ICD-10-CM

## 2016-09-04 NOTE — Telephone Encounter (Signed)
STAT order placed with high priority message to [facility] to schedule.  Renee Pain, RN

## 2016-09-04 NOTE — Telephone Encounter (Signed)
Discussed his case with Linna Hoff.   Echo showed LV thrombus during admission, but heparin held due to fall and chest hematoma.    Pt needs STAT repeat Echo.  If LV thrombus remains will have to start on a blood thinner.   Could you please contact him to schedule?

## 2016-09-05 ENCOUNTER — Telehealth (HOSPITAL_COMMUNITY): Payer: Self-pay | Admitting: Pharmacist

## 2016-09-05 ENCOUNTER — Ambulatory Visit (HOSPITAL_COMMUNITY)
Admission: RE | Admit: 2016-09-05 | Discharge: 2016-09-05 | Disposition: A | Discharge status: Home / Self Care | Payer: PPO | Source: Ambulatory Visit | Attending: Student | Admitting: Student

## 2016-09-05 DIAGNOSIS — I2510 Atherosclerotic heart disease of native coronary artery without angina pectoris: Secondary | ICD-10-CM | POA: Diagnosis not present

## 2016-09-05 DIAGNOSIS — I110 Hypertensive heart disease with heart failure: Secondary | ICD-10-CM | POA: Insufficient documentation

## 2016-09-05 DIAGNOSIS — I071 Rheumatic tricuspid insufficiency: Secondary | ICD-10-CM | POA: Diagnosis not present

## 2016-09-05 DIAGNOSIS — I5022 Chronic systolic (congestive) heart failure: Secondary | ICD-10-CM | POA: Insufficient documentation

## 2016-09-05 DIAGNOSIS — Z8674 Personal history of sudden cardiac arrest: Secondary | ICD-10-CM | POA: Diagnosis not present

## 2016-09-05 MED ORDER — PERFLUTREN LIPID MICROSPHERE
1.0000 mL | INTRAVENOUS | Status: AC | PRN
  Administered 2016-09-05: 2 mL via INTRAVENOUS
  Filled 2016-09-05: qty 10

## 2016-09-05 MED ORDER — WARFARIN SODIUM 5 MG PO TABS: 5.0000 mg | ORAL_TABLET | Freq: Every day | ORAL | 0 refills | Status: DC

## 2016-09-05 NOTE — Progress Notes (Signed)
  Echocardiogram 2D Echocardiogram has been performed.  Leland Staszewski 09/05/2016, 10:43 AM

## 2016-09-05 NOTE — Telephone Encounter (Signed)
Advised by Oda Kilts, PA-C that patient is to start warfarin today for new LV thrombus. He has a resolving hematoma so will not bridge at this time. I have recommended starting warfarin at 5 mg daily. Spoke with Mr. Platten today about starting warfarin which is not happy about. He states that he has taken warfarin in the past for an MI in '99. Discussed indication, dosing, side effects, INR goal and general info about drug and food interactions but let him know that he will get a more thorough review of these counseling points at his first appointment with the [facility]. Also made him aware that he has an appointment with [facility] Clinic next Wednesday 8/29 at 2:30 PM.   Doroteo Bradford K. Velva Harman, PharmD, BCPS, CPP Clinical Pharmacist Pager: [phone] Phone: [phone] 09/05/2016 12:14 PM

## 2016-09-10 ENCOUNTER — Ambulatory Visit (INDEPENDENT_AMBULATORY_CARE_PROVIDER_SITE_OTHER): Payer: PPO | Admitting: *Deleted

## 2016-09-10 DIAGNOSIS — Z5181 Encounter for therapeutic drug level monitoring: Secondary | ICD-10-CM | POA: Insufficient documentation

## 2016-09-10 DIAGNOSIS — I513 Intracardiac thrombosis, not elsewhere classified: Secondary | ICD-10-CM

## 2016-09-10 DIAGNOSIS — I240 Acute coronary thrombosis not resulting in myocardial infarction: Secondary | ICD-10-CM | POA: Insufficient documentation

## 2016-09-10 LAB — PROTIME-INR
INR: 5.49 — AB
Prothrombin Time: 117.4 s — ABNORMAL HIGH (ref 9.1–12.0)
Prothrombin Time: 49.6 seconds — ABNORMAL HIGH (ref 11.4–15.2)

## 2016-09-10 LAB — POCT INR: INR: 8

## 2016-09-10 NOTE — Patient Instructions (Signed)

## 2016-09-14 NOTE — Progress Notes (Signed)
Cardiology Office Note Date:  09/16/2016  Patient ID:  [name] [name], [name] [name] 09-04-1950, MRN [id number] PCP:  Patient, No Pcp Per  Cardiologist:  Dr. Haroldine Laws Electrophysiologist: Dr. Rayann Heman   Chief Complaint: post hospital visit, discuss ICD  History of Present Illness: [name] [name] is a 66 y.o. male with history of CHF (systolic), CAD, HLD, BPH, ETOH and smoker.  He is s/p OOH cardiac arrest 08/04/16 had CPR/defibrillation, with lengthy complicated hospitalization 2/2 GI perforation  Felt 2/2 to rib fracture (w/CPR) found with LV thrombus during this stay as well, he underwent multiple procedures including exploratory laparotomy and repair of perforated stomach with an omental patch by General Surgery and evacuation of chest hematoma by TCTS. Cath as below with 3v disease though mostly non-obstructive, medical management pursued.  Lifevest placed prior to discharge, with plans to re-visit ICD implant 4-6 weeks post hospitalization if other medical issues had stablalized. He was discharged 08/19/16 with life vest.  Most recently he saw A. Tillery, PA-C 09/03/16 with ongoing improvement overall. 09/02/16 saw Jadene Pierini, PA for TTS service and felt to be doing well, no need for their follow up. They report seeing Dr. Grandville Silos (general surgeon) on 8/22 doing well and released from his service as well.  He comes today accompanied by his wife.  He c/w pain at his chest wall, was hoping this would be gone by now, was told at his TCTS f/u appt this could take 6-8 weeks.  He denies anginal CP, no SOB, no dizziness, near syncope or syncope.  He is wearing his life vest, no shocks, he mentions he "takes breaks" from the vest, discussed pointedly the importance of wearing the vest is reminded to keep this on at all times.  He denies any bleeding or signs of bleeding, saw coumadin clinic today and warfarin dose decreased. (INR 4.3) He continues to smoke and use ETOH though report both as less then before,  he is counseled on this.   Result note reviewed by A. Tillery, PA-C: Discussed with Dr. Haroldine Laws who personally reviewed Echo. Thrombus visible. Have started on coumadin 09/05/16: TTE Study Conclusions - Left ventricle: Poor image quality even with Definjity EF likely   moderately reduced with septal apical and inferior hypokinesis   Cannot r/o mural apical thormbus Suggest f/u cardiac MRI. The   cavity size was moderately dilated. Wall thickness was increased   in a pattern of mild LVH. Systolic function was normal. Wall   motion was normal; there were no regional wall motion   abnormalities. Left ventricular diastolic function parameters   were normal.     Left/Right heart cath 08/13/16  Ost RCA lesion, 80 %stenosed.  Ost LM lesion, 40 %stenosed.  Ost Ramus to Ramus lesion, 80 %stenosed.  Ost LAD to Prox LAD lesion, 70 %stenosed.  Ost 1st Diag to 1st Diag lesion, 75 %stenosed.  Mid LAD to Dist LAD lesion, 40 %stenosed. Findings: Ao = 141/70 (102) LV = 126/13 RA = 5 RV = 29/8 PA = 33/9 (22) PCW = 12 Fick cardiac output/index = 5.3/3.0 PVR = 1.9 WU Ao sat = 92%  PA sat = 55%,54%  Echo 08/06/16 - LVEF 35-40%, grade 1 DD. Apparent medium-sized apical thrombus   Past Medical History:  Diagnosis Date  . Coronary artery disease    1980's-90's MI with perhaps angioplasty  . Hypertension     Past Surgical History:  Procedure Laterality Date  . CORONARY ANGIOPLASTY     "Dr. Vidal Schwalbe"  .  HEMATOMA EVACUATION Left 08/15/2016   Procedure: INSERTION OF DRAIN ANTERIOR LEFT CHEST WALL HEMATOMA;  Surgeon: Ivin Poot, MD;  Location: [place];  Service: Thoracic;  Laterality: Left;  . LAPAROTOMY N/A 08/04/2016   Procedure: EXPLORATORY LAPAROTOMY FREE AIR BIOPSY OF PERFORATED STOMACH ULCER REPAIR OF PERFORATED STOMACH ULCER WITH A OMENTAL PATCH;  Surgeon: Georganna Skeans, MD;  Location: [place];  Service: General;  Laterality: N/A;  . RIGHT/LEFT HEART CATH AND CORONARY  ANGIOGRAPHY N/A 08/13/2016   Procedure: Right/Left Heart Cath and Coronary Angiography;  Surgeon: Jolaine Artist, MD;  Location: [facility];  Service: Cardiovascular;  Laterality: N/A;    Current Outpatient Prescriptions  Medication Sig Dispense Refill  . acetaminophen (TYLENOL) 325 MG tablet Take 2 tablets (650 mg total) by mouth every 4 (four) hours as needed for mild pain or headache.    Marland Kitchen aspirin EC 81 MG tablet Take 1 tablet (81 mg total) by mouth daily. 30 tablet 0  . carvedilol (COREG) 6.25 MG tablet Take 1 tablet (6.25 mg total) by mouth 2 (two) times daily with a meal. 60 tablet 0  . folic acid (FOLVITE) 1 MG tablet Take 1 tablet (1 mg total) by mouth daily. 30 tablet 0  . magnesium oxide (MAG-OX) 400 (241.3 Mg) MG tablet Take 1 tablet (400 mg total) by mouth daily. 30 tablet 0  . rosuvastatin (CRESTOR) 10 MG tablet Take 10 mg by mouth daily.    . sacubitril-valsartan (ENTRESTO) 49-51 MG Take 1 tablet by mouth 2 (two) times daily. 60 tablet 0  . spironolactone (ALDACTONE) 25 MG tablet Take 1 tablet (25 mg total) by mouth daily. 30 tablet 0  . warfarin (COUMADIN) 2 MG tablet Take as directed by Coumadin Clinic 30 tablet 1   No current facility-administered medications for this visit.     Allergies:   Patient has no known allergies.   Social History:  The patient  reports that he has been smoking Cigarettes.  He has a 25.00 pack-year smoking history. He has quit using smokeless tobacco. His smokeless tobacco use included Chew. He reports that he does not use drugs.   Family History:  The patient's family history includes Lung cancer in his father; Osteoporosis in his mother; Other in his brother.  ROS:  Please see the history of present illness.  All other systems are reviewed and otherwise negative.   PHYSICAL EXAM:  VS:  BP 112/72   Pulse 75   Ht 5\' 8"  (1.727 m)   Wt 123 lb (55.8 kg)   BMI 18.70 kg/m  BMI: Body mass index is 18.7 kg/m. Well nourished, well  developed, in no acute distress  HEENT: normocephalic, atraumatic  Neck: no JVD, carotid bruits or masses Cardiac:  RRR; no significant murmurs, no rubs, or gallops Lungs:  CTA b/l, no wheezing, rhonchi or rales  Abd: soft, nontender MS: no deformity, advanced atrophy Ext:  no edema  Skin: warm and dry, no rash Neuro:  No gross deficits appreciated Psych: euthymic mood, full affect  Abd wound appears to be healing well L chest wound has a stitch remaining at the very medial edge, slightly erythematous there only, no fluid collection, not warm or tender, no drainage  EKG:  Not done today  Recent Labs: 08/17/2016: ALT 54 09/03/2016: BUN 7; Creatinine, Ser 0.80; Hemoglobin 11.4; Magnesium 1.8; Platelets 426; Potassium 4.7; Sodium 136  08/04/2016: Triglycerides 88   Estimated Creatinine Clearance: 70.7 mL/min (by C-G formula based on SCr of 0.8  mg/dL).   Wt Readings from Last 3 Encounters:  09/16/16 123 lb (55.8 kg)  09/03/16 126 lb 12.8 oz (57.5 kg)  09/02/16 124 lb 9.6 oz (56.5 kg)     Other studies reviewed: Additional studies/records reviewed today include: summarized above  ASSESSMENT AND PLAN:  1. Cardiac ArrestVT/VF:     Suspectedprimary event was VT/VF arrest in setting of known ischemic CM (MI in late 80-90. Cared for by Dr. Doreatha Lew. No f/u since. Reports cath at that time with what sounds like POBA)    Life vest, counseled on importance of compliance (wife reports she has discussed with him as well)     Called Dr. Ewell Poe office, they will see the patient today, he can go over to the office after leaving here to check the wound/remove stitch. We will see him back in 2-3 weeks to re-evaluate for ICD timing, ensure chest wall wound is well healed/without infection Discussed indication of ICD, procedure, risks/benefits today, they are agreeable to proceed  2. CAD with ischemic CM     No anginal complaints     On ASA, BB, statin tx     Records reviewed, planned for  medical therapy unless he develops angina     Exam appears euvolemic      3. Perforated stomach s/p repair. Suspect due to CPR.   4. LV Thrombus:      on warfarin, following with coumadin clinic  5. Chest wall hematoma      CTS service follow up noted     Stitch remains at his site     They will see him today  6. ETOH and tobacco abuse     counseled  Disposition: We will see him back in 2-3 weeks, sooner if needed,  Current medicines are reviewed at length with the patient today.  The patient did not have any concerns regarding medicines.  Haywood Lasso, PA-C 09/16/2016 10:47 AM     [facility] [place] [id number] [phone] (office)  [phone] (fax)

## 2016-09-16 ENCOUNTER — Ambulatory Visit (INDEPENDENT_AMBULATORY_CARE_PROVIDER_SITE_OTHER): Payer: PPO | Admitting: Physician Assistant

## 2016-09-16 ENCOUNTER — Ambulatory Visit (INDEPENDENT_AMBULATORY_CARE_PROVIDER_SITE_OTHER): Payer: PPO

## 2016-09-16 ENCOUNTER — Ambulatory Visit (INDEPENDENT_AMBULATORY_CARE_PROVIDER_SITE_OTHER): Payer: PPO | Admitting: *Deleted

## 2016-09-16 VITALS — BP 112/72 | HR 75 | Ht 68.0 in | Wt 123.0 lb

## 2016-09-16 VITALS — BP 107/74 | HR 69 | Resp 16 | Ht 68.0 in | Wt 123.0 lb

## 2016-09-16 DIAGNOSIS — I255 Ischemic cardiomyopathy: Secondary | ICD-10-CM | POA: Diagnosis not present

## 2016-09-16 DIAGNOSIS — I2510 Atherosclerotic heart disease of native coronary artery without angina pectoris: Secondary | ICD-10-CM

## 2016-09-16 DIAGNOSIS — Z5181 Encounter for therapeutic drug level monitoring: Secondary | ICD-10-CM | POA: Diagnosis not present

## 2016-09-16 DIAGNOSIS — I240 Acute coronary thrombosis not resulting in myocardial infarction: Secondary | ICD-10-CM

## 2016-09-16 DIAGNOSIS — I469 Cardiac arrest, cause unspecified: Secondary | ICD-10-CM | POA: Diagnosis not present

## 2016-09-16 DIAGNOSIS — I513 Intracardiac thrombosis, not elsewhere classified: Secondary | ICD-10-CM

## 2016-09-16 DIAGNOSIS — Z5189 Encounter for other specified aftercare: Secondary | ICD-10-CM | POA: Diagnosis not present

## 2016-09-16 DIAGNOSIS — Z09 Encounter for follow-up examination after completed treatment for conditions other than malignant neoplasm: Secondary | ICD-10-CM

## 2016-09-16 DIAGNOSIS — S20219A Contusion of unspecified front wall of thorax, initial encounter: Secondary | ICD-10-CM

## 2016-09-16 LAB — POCT INR: INR: 4.3

## 2016-09-16 MED ORDER — WARFARIN SODIUM 2 MG PO TABS: ORAL_TABLET | ORAL | 1 refills | Status: DC

## 2016-09-16 NOTE — Patient Instructions (Signed)
Medication Instructions:   Your physician recommends that you continue on your current medications as directed. Please refer to the Current Medication list given to you today.   If you need a refill on your cardiac medications before your next appointment, please call your pharmacy.  Labwork: NONE ORDERED  TODAY    Testing/Procedures: NONE ORDERED  TODAY    Follow-Up: IN 2 TO 3 WEEKS WITH ALLRED OR WITH RENEE URSUY ON A DAY DR ALLRED IN OFFICE    Any Other Special Instructions Will Be Listed Below (If Applicable).   MAKE SURE YOU GO TO DR Lawson Fiscal OFFICE TODAY FOR WOUND CARE

## 2016-09-17 ENCOUNTER — Ambulatory Visit (HOSPITAL_COMMUNITY)
Admission: RE | Admit: 2016-09-17 | Discharge: 2016-09-17 | Disposition: A | Discharge status: Home / Self Care | Payer: PPO | Source: Ambulatory Visit | Attending: Internal Medicine | Admitting: Internal Medicine

## 2016-09-17 DIAGNOSIS — I2510 Atherosclerotic heart disease of native coronary artery without angina pectoris: Secondary | ICD-10-CM | POA: Diagnosis not present

## 2016-09-17 DIAGNOSIS — E785 Hyperlipidemia, unspecified: Secondary | ICD-10-CM | POA: Insufficient documentation

## 2016-09-17 DIAGNOSIS — Z048 Encounter for examination and observation for other specified reasons: Secondary | ICD-10-CM | POA: Diagnosis not present

## 2016-09-17 DIAGNOSIS — I110 Hypertensive heart disease with heart failure: Secondary | ICD-10-CM | POA: Diagnosis not present

## 2016-09-17 DIAGNOSIS — I5022 Chronic systolic (congestive) heart failure: Secondary | ICD-10-CM | POA: Insufficient documentation

## 2016-09-17 DIAGNOSIS — Z720 Tobacco use: Secondary | ICD-10-CM | POA: Insufficient documentation

## 2016-09-17 DIAGNOSIS — N400 Enlarged prostate without lower urinary tract symptoms: Secondary | ICD-10-CM | POA: Diagnosis not present

## 2016-09-17 MED ORDER — CARVEDILOL 6.25 MG PO TABS: 6.2500 mg | ORAL_TABLET | Freq: Two times a day (BID) | ORAL | 5 refills | Status: DC

## 2016-09-17 MED ORDER — SPIRONOLACTONE 25 MG PO TABS
25.0000 mg | ORAL_TABLET | Freq: Every day | ORAL | 5 refills | Status: DC
Start: 2016-09-17 — End: 2016-10-01

## 2016-09-17 MED ORDER — ROSUVASTATIN CALCIUM 10 MG PO TABS
10.0000 mg | ORAL_TABLET | Freq: Every day | ORAL | 5 refills | Status: DC
Start: 2016-09-17 — End: 2018-10-29

## 2016-09-17 MED ORDER — SACUBITRIL-VALSARTAN 49-51 MG PO TABS: 1.0000 | ORAL_TABLET | Freq: Two times a day (BID) | ORAL | 5 refills | Status: DC

## 2016-09-17 NOTE — Patient Instructions (Addendum)
It was great to see you today!  Please continue your medications as prescribed.   Please keep your appointment with Dr. Haroldine Laws on 10/09/16.

## 2016-09-17 NOTE — Progress Notes (Signed)
HF MD: Haroldine Laws  HPI:  [name] [name] is a 66 y.o. Caucasian male with history of Systolic CHF, CAD, HTN, HLD, tobacco abuse, and BPH.   Had complicated admission 4/69/62 to 08/19/16 due to out of hospital cardiac arrest c/b cpr/defib and GI perforation thought 2/2 to rib fracture as well as large chest wall hematoma and t8 vertebral fracture thought all from CPR. Noted to have LV thrombus as well.  EP saw and recommended ICD as outpatient with long, complicated admission. Meds adjusted as tolerated. Pt underwent multiple procedures including exploratory laparotomy and repair of perforated stomach with an omental patch by General Surgery and evacuation of chest hematoma by TCTS. Cath as below with 3v disease though mostly non-obstructive, medical management pursued.  Lifevest placed prior to discharge.   He presents today for pharmacist-led HF medication titration with his wife. At last HF clinic visit on 09/03/16, no medication changes were made. He was started on warfarin recently for new LV thrombus. He has been feeling well overall. Much better than admission. He continues to have discomfort around his hematoma site which was drained yesterday at Dr. Lucianne Lei Trigt's office. Denies SOB. No CP other than soreness above. Has occasional lightheadedness upon standing too quickly or looking down too long. Has been walking his new puppy (Blossom) daily without much difficulty or SOB.   . Shortness of breath/dyspnea on exertion? no  . Orthopnea/PND? no . Edema? no . Lightheadedness/dizziness? Yes - upon standing too quickly or looking down  . Daily weights at home? Yes - stable 123-124 lb  . Blood pressure/heart rate monitoring at home? Yes - usually SBP 100s . Following low-sodium/fluid-restricted diet? No - not much of an appetite but stable  HF Medications: Carvedilol 6.25 mg PO BID Entresto 49-51 mg PO BID Spironolactone 25 mg PO daily   Has the patient been experiencing any side effects to the  medications prescribed?  no  Does the patient have any problems obtaining medications due to transportation or finances?   No - H[place] Part D, but will attempt [employer] PAF for Entresto since copays are $70/mo and PANF currently out of funding   Understanding of regimen: good Understanding of indications: good Potential of compliance: good Patient understands to avoid NSAIDs. Patient understands to avoid decongestants.    Pertinent Lab Values: . 09/03/16: Serum creatinine 0.80, BUN 7, Potassium 4.7, Sodium 136, Magnesium 1.8  Vital Signs: . Weight: 126 lb (dry weight: 123-126 lb) . Blood pressure: 100/62 mmHg  . Heart rate: 70 bpm    Assessment: 1. Chronicsystolic CHF (EF 95-28%), due to ICM. NYHA class II-IIIsymptoms.  - Volume status stable  - With soft BP and occasional dizziness, not much room to uptitrate current medications  - Continue carvedilol 6.25 mg BID, Entresto 49-51 mg BID and spironolactone 25 mg dialy - Basic disease state pathophysiology, medication indication, mechanism and side effects reviewed at length with patient and he verbalized understanding 2. CAD with ICM s/p Cardiac Arrest VT/VF (Cath 08/13/16 with 3V disease (mostly non-obstructive) medical therapy. If develops angina can consider PCI to Ramus)  - S/p evacuation of chest hematoma  - Lifevest in place. Sees EP in 2 weeks for ICD plannning  - No s/s of ischemia. Continue statin and ASA 3. Perforated stomach s/p repair.  - Suspect due to CPR. Cannot exclude PUD though recent EGD ok 4. Frequent PVCs  - Much improved with b-blocker   - Continue Coreg 6.25 mg BID  5. LV Thrombus:   -  Started on warfarin 09/05/16 after resolved chest hematoma, managed by Coumadin Clinic  6. Chest wall hematoma s/p drainage  - Saw TCTS yesterday. Doing well.  7. ETOH and tobacco abuse.  - Encouraged complete cessation of ETOH and tobacco  Plan: 1) Medication changes: Based on clinical presentation, vital signs and recent  labs will continue current regimen 2) Labs: PRN 3) Follow-up: Dr. Haroldine Laws on 10/09/16   Ruta Hinds. Velva Harman, PharmD, BCPS, CPP Clinical Pharmacist Pager: [phone] Phone: [phone] 09/17/2016 2:51 PM

## 2016-09-18 ENCOUNTER — Other Ambulatory Visit (HOSPITAL_COMMUNITY): Payer: Self-pay | Admitting: *Deleted

## 2016-09-18 MED ORDER — SACUBITRIL-VALSARTAN 49-51 MG PO TABS: 1.0000 | ORAL_TABLET | Freq: Two times a day (BID) | ORAL | 3 refills | Status: DC

## 2016-09-19 DIAGNOSIS — I469 Cardiac arrest, cause unspecified: Secondary | ICD-10-CM | POA: Diagnosis not present

## 2016-09-19 DIAGNOSIS — I4901 Ventricular fibrillation: Secondary | ICD-10-CM | POA: Diagnosis not present

## 2016-09-19 DIAGNOSIS — I472 Ventricular tachycardia: Secondary | ICD-10-CM | POA: Diagnosis not present

## 2016-09-23 ENCOUNTER — Ambulatory Visit (INDEPENDENT_AMBULATORY_CARE_PROVIDER_SITE_OTHER): Payer: PPO | Admitting: *Deleted

## 2016-09-23 DIAGNOSIS — I240 Acute coronary thrombosis not resulting in myocardial infarction: Secondary | ICD-10-CM

## 2016-09-23 DIAGNOSIS — Z5181 Encounter for therapeutic drug level monitoring: Secondary | ICD-10-CM

## 2016-09-23 DIAGNOSIS — I513 Intracardiac thrombosis, not elsewhere classified: Secondary | ICD-10-CM

## 2016-09-23 LAB — POCT INR: INR: 4.6

## 2016-09-29 ENCOUNTER — Ambulatory Visit: Payer: PPO | Admitting: Physician Assistant

## 2016-09-30 ENCOUNTER — Ambulatory Visit (INDEPENDENT_AMBULATORY_CARE_PROVIDER_SITE_OTHER): Payer: PPO

## 2016-09-30 DIAGNOSIS — Z5181 Encounter for therapeutic drug level monitoring: Secondary | ICD-10-CM | POA: Diagnosis not present

## 2016-09-30 DIAGNOSIS — I513 Intracardiac thrombosis, not elsewhere classified: Secondary | ICD-10-CM

## 2016-09-30 DIAGNOSIS — I240 Acute coronary thrombosis not resulting in myocardial infarction: Secondary | ICD-10-CM

## 2016-09-30 LAB — POCT INR: INR: 1.2

## 2016-10-01 ENCOUNTER — Encounter: Payer: Self-pay | Admitting: Cardiothoracic Surgery

## 2016-10-01 ENCOUNTER — Ambulatory Visit (INDEPENDENT_AMBULATORY_CARE_PROVIDER_SITE_OTHER): Payer: Self-pay | Admitting: Cardiothoracic Surgery

## 2016-10-01 ENCOUNTER — Telehealth (HOSPITAL_COMMUNITY): Payer: Self-pay | Admitting: *Deleted

## 2016-10-01 VITALS — BP 110/69 | HR 75 | Temp 97.2°F | Resp 16 | Ht 68.0 in | Wt 125.2 lb

## 2016-10-01 DIAGNOSIS — S20219A Contusion of unspecified front wall of thorax, initial encounter: Secondary | ICD-10-CM

## 2016-10-01 DIAGNOSIS — N62 Hypertrophy of breast: Secondary | ICD-10-CM

## 2016-10-01 DIAGNOSIS — Z09 Encounter for follow-up examination after completed treatment for conditions other than malignant neoplasm: Secondary | ICD-10-CM

## 2016-10-01 MED ORDER — EPLERENONE 25 MG PO TABS: 25.0000 mg | ORAL_TABLET | Freq: Every day | ORAL | 3 refills | Status: DC

## 2016-10-01 NOTE — Telephone Encounter (Signed)
Need to put on same dose of Eplerenone if he can afford.    Needs BMET one week if start Eplerenone.   Legrand Como [address] [place], PA-C 10/01/2016 4:18 PM

## 2016-10-01 NOTE — Progress Notes (Signed)
PCP is [place], Stage manager At Referring Provider is Judeth Horn, MD  Chief Complaint  Patient presents with  . Routine Post Op    with c/o new area of rednees/pain near area of previous hematoma of left chest wall    HPI: Patient returns for evaluation complaining of a painful left breast nodular density. This is behind the nipple. He had a left upper chest incision to evacuate a chest wall hematoma 6 weeks ago. That incision is well-healed. The patient has heart failure and was started on Aldactone  lwhich is probably the source of his symptoms. He will stop the Aldactone and check with his cardiologist.  Past Medical History:  Diagnosis Date  . Coronary artery disease    1980's-90's MI with perhaps angioplasty  . Hypertension     Past Surgical History:  Procedure Laterality Date  . CORONARY ANGIOPLASTY     "Dr. Vidal Schwalbe"  . HEMATOMA EVACUATION Left 08/15/2016   Procedure: INSERTION OF DRAIN ANTERIOR LEFT CHEST WALL HEMATOMA;  Surgeon: Ivin Poot, MD;  Location: [place];  Service: Thoracic;  Laterality: Left;  . LAPAROTOMY N/A 08/04/2016   Procedure: EXPLORATORY LAPAROTOMY FREE AIR BIOPSY OF PERFORATED STOMACH ULCER REPAIR OF PERFORATED STOMACH ULCER WITH A OMENTAL PATCH;  Surgeon: Georganna Skeans, MD;  Location: [place];  Service: General;  Laterality: N/A;  . RIGHT/LEFT HEART CATH AND CORONARY ANGIOGRAPHY N/A 08/13/2016   Procedure: Right/Left Heart Cath and Coronary Angiography;  Surgeon: Jolaine Artist, MD;  Location: [facility];  Service: Cardiovascular;  Laterality: N/A;    Family History  Problem Relation Age of Onset  . Osteoporosis Mother   . Lung cancer Father   . Other Brother        died in [place]    Social History Social History  Substance Use Topics  . Smoking status: Current Every Day Smoker    Packs/day: 1.00    Years: 25.00    Types: Cigarettes  . Smokeless tobacco: Former Systems developer    Types: Chew  . Alcohol use Not on file     Current Outpatient Prescriptions  Medication Sig Dispense Refill  . acetaminophen (TYLENOL) 325 MG tablet Take 2 tablets (650 mg total) by mouth every 4 (four) hours as needed for mild pain or headache.    Marland Kitchen aspirin EC 81 MG tablet Take 1 tablet (81 mg total) by mouth daily. 30 tablet 0  . carvedilol (COREG) 6.25 MG tablet Take 1 tablet (6.25 mg total) by mouth 2 (two) times daily with a meal. 60 tablet 5  . folic acid (FOLVITE) 1 MG tablet Take 1 tablet (1 mg total) by mouth daily. 30 tablet 0  . magnesium oxide (MAG-OX) 400 (241.3 Mg) MG tablet Take 1 tablet (400 mg total) by mouth daily. 30 tablet 0  . rosuvastatin (CRESTOR) 10 MG tablet Take 1 tablet (10 mg total) by mouth daily. 30 tablet 5  . sacubitril-valsartan (ENTRESTO) 49-51 MG Take 1 tablet by mouth 2 (two) times daily. 180 tablet 3  . spironolactone (ALDACTONE) 25 MG tablet Take 1 tablet (25 mg total) by mouth daily. 30 tablet 5  . warfarin (COUMADIN) 2 MG tablet Take as directed by Coumadin Clinic 30 tablet 1   No current facility-administered medications for this visit.     No Known Allergies  Review of Systems  Left breast pain Temporary AICD best has not discharged BP 110/69 (BP Location: Right Arm, Patient Position: Sitting, Cuff Size: Normal)   Pulse 75  Temp (!) 97.2 F (36.2 C) (Oral)   Resp 16   Ht 5\' 8"  (1.727 m)   Wt 125 lb 3.2 oz (56.8 kg)   SpO2 97% Comment: ON RA  BMI 19.04 kg/m  Physical Exam      Exam    General- alert and comfortable   Lungs- clear without rales, wheezes   Chest-half centimeter tender nodular density behind the left nipple. Previous left anterior chest incision well-healed.   Cor- regular rate and rhythm, no murmur , gallop   Abdomen- soft, non-tender   Extremities - warm, non-tender, minimal edema   Neuro- oriented, appropriate, no focal weakness   Diagnostic Tests: None  Impression: Tender gynecomastia probably related to medication Previous surgical incision  well-healed Plan: Change of medication recommended-stop Aldactone. He is having some significant pain from the density and I gave him a prescription for tramadol since Tylenol has had little effect. Len Childs, MD [facility] [phone]

## 2016-10-01 NOTE — Telephone Encounter (Signed)
Advanced Heart Failure Triage Encounter  Patient Name: [name] [name]  Date of Call: 10/01/16  Problem: Advised to stop Spironolactone  Patient seen today by Dr. Prescott Gum and complained of painful left breast lump.  Dr. Prescott Gum advised him to stop taking Arlyce Harman and let us know.    Plan:  Will send to Barrington Ellison, PA to see if he wants to start patient on eplerenone for now and will let patient know.   [name] Doom, RN

## 2016-10-01 NOTE — Telephone Encounter (Signed)
Patient has been started on Eplerenone 25 mg Daily and Spironolactone has been discontinued.  Medication sent to preferred pharmacy and patient is aware. No further questions.

## 2016-10-02 ENCOUNTER — Other Ambulatory Visit (HOSPITAL_COMMUNITY): Payer: Self-pay | Admitting: Student

## 2016-10-06 ENCOUNTER — Telehealth (HOSPITAL_COMMUNITY): Payer: Self-pay | Admitting: Pharmacist

## 2016-10-06 NOTE — Telephone Encounter (Signed)
[employer] PAF denied 2/2 income exceeding program limits ($7292/mo).   Ruta Hinds. Velva Harman, PharmD, BCPS, CPP Clinical Pharmacist Pager: [phone] Phone: [phone] 10/06/2016 9:49 AM

## 2016-10-08 ENCOUNTER — Ambulatory Visit (INDEPENDENT_AMBULATORY_CARE_PROVIDER_SITE_OTHER): Payer: PPO | Admitting: *Deleted

## 2016-10-08 DIAGNOSIS — Z5181 Encounter for therapeutic drug level monitoring: Secondary | ICD-10-CM

## 2016-10-08 DIAGNOSIS — I513 Intracardiac thrombosis, not elsewhere classified: Secondary | ICD-10-CM | POA: Diagnosis not present

## 2016-10-08 DIAGNOSIS — I240 Acute coronary thrombosis not resulting in myocardial infarction: Secondary | ICD-10-CM

## 2016-10-08 LAB — POCT INR: INR: 3.8

## 2016-10-09 ENCOUNTER — Encounter (HOSPITAL_COMMUNITY): Payer: Self-pay | Admitting: Internal Medicine

## 2016-10-09 ENCOUNTER — Ambulatory Visit (HOSPITAL_COMMUNITY)
Admission: RE | Admit: 2016-10-09 | Discharge: 2016-10-09 | Disposition: A | Discharge status: Home / Self Care | Payer: PPO | Source: Ambulatory Visit | Attending: Internal Medicine | Admitting: Internal Medicine

## 2016-10-09 VITALS — BP 106/60 | HR 78 | Wt 127.8 lb

## 2016-10-09 DIAGNOSIS — I513 Intracardiac thrombosis, not elsewhere classified: Secondary | ICD-10-CM

## 2016-10-09 DIAGNOSIS — I5022 Chronic systolic (congestive) heart failure: Secondary | ICD-10-CM | POA: Insufficient documentation

## 2016-10-09 DIAGNOSIS — Z8674 Personal history of sudden cardiac arrest: Secondary | ICD-10-CM | POA: Diagnosis not present

## 2016-10-09 DIAGNOSIS — N62 Hypertrophy of breast: Secondary | ICD-10-CM | POA: Diagnosis not present

## 2016-10-09 DIAGNOSIS — I252 Old myocardial infarction: Secondary | ICD-10-CM | POA: Insufficient documentation

## 2016-10-09 DIAGNOSIS — I493 Ventricular premature depolarization: Secondary | ICD-10-CM | POA: Insufficient documentation

## 2016-10-09 DIAGNOSIS — I240 Acute coronary thrombosis not resulting in myocardial infarction: Secondary | ICD-10-CM

## 2016-10-09 DIAGNOSIS — I2510 Atherosclerotic heart disease of native coronary artery without angina pectoris: Secondary | ICD-10-CM | POA: Diagnosis not present

## 2016-10-09 DIAGNOSIS — I255 Ischemic cardiomyopathy: Secondary | ICD-10-CM | POA: Insufficient documentation

## 2016-10-09 DIAGNOSIS — I110 Hypertensive heart disease with heart failure: Secondary | ICD-10-CM | POA: Diagnosis not present

## 2016-10-09 DIAGNOSIS — Z7901 Long term (current) use of anticoagulants: Secondary | ICD-10-CM | POA: Insufficient documentation

## 2016-10-09 DIAGNOSIS — F17210 Nicotine dependence, cigarettes, uncomplicated: Secondary | ICD-10-CM | POA: Insufficient documentation

## 2016-10-09 DIAGNOSIS — Z7982 Long term (current) use of aspirin: Secondary | ICD-10-CM | POA: Diagnosis not present

## 2016-10-09 DIAGNOSIS — E785 Hyperlipidemia, unspecified: Secondary | ICD-10-CM | POA: Diagnosis not present

## 2016-10-09 DIAGNOSIS — N400 Enlarged prostate without lower urinary tract symptoms: Secondary | ICD-10-CM | POA: Insufficient documentation

## 2016-10-09 LAB — BASIC METABOLIC PANEL
ANION GAP: 9 (ref 5–15)
BUN: 7 mg/dL (ref 6–20)
CALCIUM: 9.1 mg/dL (ref 8.9–10.3)
CO2: 26 mmol/L (ref 22–32)
Chloride: 94 mmol/L — ABNORMAL LOW (ref 101–111)
Creatinine, Ser: 0.79 mg/dL (ref 0.61–1.24)
Glucose, Bld: 93 mg/dL (ref 65–99)
Potassium: 5.2 mmol/L — ABNORMAL HIGH (ref 3.5–5.1)
Sodium: 129 mmol/L — ABNORMAL LOW (ref 135–145)

## 2016-10-09 NOTE — Patient Instructions (Signed)
Labs drawn today (if we do not call you, then your lab work was stable)   Your physician has requested that you have an echocardiogram. Echocardiography is a painless test that uses sound waves to create images of your heart. It provides your doctor with information about the size and shape of your heart and how well your heart's chambers and valves are working. This procedure takes approximately one hour. There are no restrictions for this procedure.  Your physician recommends that you schedule a follow-up appointment in: 3 months

## 2016-10-09 NOTE — Progress Notes (Signed)
Advanced Heart Failure Clinic Note   Primary Cardiologist: Dr. Haroldine Laws  EP: Dr. Rayann Heman  HPI  [name] [name] is a 66 y.o. male with history of Systolic CHF, CAD, HTN, HLD, tobacco abuse, and BPH.   Had complicated admission 6/96/78 to 08/19/16 due to out of hospital cardiac arrest c/b cpr/defib and gi perforation thought 2/2 to rib fracture as well as large chest wall hematoma and t8 vertebral fracture thought all from CPR. Noted to have LV thrombus as well.  EP saw and recommended ICD as outpatient with long, complicated admission. Meds adjusted as tolerated. Pt underwent multiple procedures including exploratory laparotomy and repair of perforated stomach with an omental patch by General Surgery and evacuation of chest hematoma by TCTS. Cath as below with 3v disease though mostly non-obstructive, medical management pursued.  Lifevest placed prior to discharge.   He is here for follow up. Doing fairly well. Main complaint was painful gynecomastia. Arlyce Harman switched to eplerenone last week and now better. Continues to wear LifeVest without a problem. Sees Dr. Rayann Heman Monday for possible ICD. No CP or SOB. Has a puppy and walks for 60mins with her every day. No edema, orthopnea or PND. Drinking 2-3 glasses of wine per night. No beer. Smoking < 1ppd.   Echo 08/06/16 - LVEF 35-40%, grade 1 DD. Apparent medium-sized apical thrombus.   Left/Right heart cath 08/13/16  Ost RCA lesion, 80 %stenosed.  Ost LM lesion, 40 %stenosed.  Ost Ramus to Ramus lesion, 80 %stenosed.  Ost LAD to Prox LAD lesion, 70 %stenosed.  Ost 1st Diag to 1st Diag lesion, 75 %stenosed.  Mid LAD to Dist LAD lesion, 40 %stenosed.  Findings:  Ao = 141/70 (102) LV = 126/13 RA = 5 RV = 29/8 PA = 33/9 (22) PCW = 12 Fick cardiac output/index = 5.3/3.0 PVR = 1.9 WU Ao sat = 92%  PA sat = 55%,54%  Review of systems complete and found to be negative unless listed in HPI.    Past Medical History:  Diagnosis Date  .  Coronary artery disease    1980's-90's MI with perhaps angioplasty  . Hypertension    Current Outpatient Prescriptions  Medication Sig Dispense Refill  . acetaminophen (TYLENOL) 325 MG tablet Take 2 tablets (650 mg total) by mouth every 4 (four) hours as needed for mild pain or headache.    Marland Kitchen aspirin EC 81 MG tablet Take 1 tablet (81 mg total) by mouth daily. 30 tablet 0  . carvedilol (COREG) 6.25 MG tablet Take 1 tablet (6.25 mg total) by mouth 2 (two) times daily with a meal. 60 tablet 5  . eplerenone (INSPRA) 25 MG tablet Take 1 tablet (25 mg total) by mouth daily. 30 tablet 3  . folic acid (FOLVITE) 1 MG tablet Take 1 tablet (1 mg total) by mouth daily. 30 tablet 0  . magnesium oxide (MAG-OX) 400 (241.3 Mg) MG tablet Take 1 tablet (400 mg total) by mouth daily. 30 tablet 0  . rosuvastatin (CRESTOR) 10 MG tablet Take 1 tablet (10 mg total) by mouth daily. 30 tablet 5  . sacubitril-valsartan (ENTRESTO) 49-51 MG Take 1 tablet by mouth 2 (two) times daily. 180 tablet 3  . traMADol (ULTRAM) 50 MG tablet Take by mouth every 6 (six) hours as needed.    . warfarin (COUMADIN) 2 MG tablet Take as directed by Coumadin Clinic 30 tablet 1   No current facility-administered medications for this encounter.    No Known Allergies  Social History  Social History  . Marital status: Married    Spouse name: N/A  . Number of children: N/A  . Years of education: N/A   Occupational History  . Not on file.   Social History Main Topics  . Smoking status: Current Every Day Smoker    Packs/day: 1.00    Years: 25.00    Types: Cigarettes  . Smokeless tobacco: Former Systems developer    Types: Chew  . Alcohol use Not on file  . Drug use: No  . Sexual activity: Not Currently   Other Topics Concern  . Not on file   Social History Narrative  . No narrative on file    Family History  Problem Relation Age of Onset  . Osteoporosis Mother   . Lung cancer Father   . Other Brother        died in [place]    Vitals:   Oct 29, 2016 1053  BP: 106/60  Pulse: 78  SpO2: 97%  Weight: 127 lb 12.8 oz (58 kg)   Wt Readings from Last 3 Encounters:  10-29-2016 127 lb 12.8 oz (58 kg)  10/01/16 125 lb 3.2 oz (56.8 kg)  09/17/16 126 lb 12.8 oz (57.5 kg)    PHYSICAL EXAM: General:  Thin male.  No respiratory difficulty HEENT: normal Neck: supple. JVP does not appear elevated. Carotids 2+ bilat; no bruits. No lymphadenopathy or thyromegaly appreciated. Cor: PMI laterally displaced. Regular rate & rhythm. No rubs, gallops or murmurs. Sternal hematoma resolved. Wearing LifeVest Lungs: clear but decreased BS throughout  Abdomen: soft, nontender, nondistended. No hepatosplenomegaly. No bruits or masses. Good bowel sounds. Well healed surgical scar Extremities: no cyanosis, clubbing, rash, edema Neuro: alert & oriented x 3, cranial nerves grossly intact. moves all 4 extremities w/o difficulty. Affect pleasant.  ASSESSMENT & PLAN:  1. CAD with ICM s/p Cardiac Arrest VT/VF:  - Cath 08/13/16 with 3V disease (mostly non-obstructive)  medical therapy. If develops angina can consider PCI to Ramus.  - Stable. No s/s ischemia. Continue statin and ASA.  - Lifevest in place. Sees EP soon for ICD - Will need CR  2. Chronic systolic HF.  - Echo 6/38/93 - LVEF 35-40%, grade 1 DD. Apparent medium-sized apical thrombus.  - NYHA II. Volume status looks good.  - Continue entresto 49/51 mg BID. BP too low to titrate - Continue coreg 6.25 mg BID.  - Continue eplerenone 25 mg daily.  (did not tolerate spiro due to painful gynecomastia)  3. Perforated stomach s/p repair. - Suspect due to CPR. S/p repair. Doing well  4. Frequent PVCs - Much improved with b-blocker.  - Continue Coreg 6.25 mg BID.   5. LV Thrombus:  - Remains on warfarin which he doesn't like. Repeat echo next visit to reassess LV thrombus.   6. ETOH and tobacco abuse.  - He has cut back. Encouraged complete cessation of ETOH and tobacco.     Glori Bickers, MD 10-29-16

## 2016-10-13 ENCOUNTER — Ambulatory Visit (INDEPENDENT_AMBULATORY_CARE_PROVIDER_SITE_OTHER): Payer: PPO | Admitting: Internal Medicine

## 2016-10-13 ENCOUNTER — Encounter: Payer: Self-pay | Admitting: Internal Medicine

## 2016-10-13 VITALS — BP 124/70 | HR 73 | Ht 68.0 in | Wt 172.0 lb

## 2016-10-13 DIAGNOSIS — I255 Ischemic cardiomyopathy: Secondary | ICD-10-CM | POA: Diagnosis not present

## 2016-10-13 DIAGNOSIS — I2510 Atherosclerotic heart disease of native coronary artery without angina pectoris: Secondary | ICD-10-CM

## 2016-10-13 DIAGNOSIS — I240 Acute coronary thrombosis not resulting in myocardial infarction: Secondary | ICD-10-CM

## 2016-10-13 DIAGNOSIS — I513 Intracardiac thrombosis, not elsewhere classified: Secondary | ICD-10-CM

## 2016-10-13 DIAGNOSIS — I5022 Chronic systolic (congestive) heart failure: Secondary | ICD-10-CM | POA: Diagnosis not present

## 2016-10-13 DIAGNOSIS — I469 Cardiac arrest, cause unspecified: Secondary | ICD-10-CM

## 2016-10-13 NOTE — Progress Notes (Signed)
PCP: [facility] Cardiologist: Dr Haroldine Laws (previously Dr Doreatha Lew) Primary EP: Dr Darryll Capers is a 66 y.o. male who presents today for routine electrophysiology followup. He is s/p VF arrest with very complicated hospitalization.  He has made very slow progress.  He is returning to his prior health state and appears to be making good recovery.  He has lifevest in place and is not aware of any shocks delivered.  He is slowly becoming more active. Since last being seen in our clinic, the patient reports doing very well.  Today, he denies symptoms of palpitations, chest pain, shortness of breath,  lower extremity edema, dizziness, presyncope, or syncope.  The patient is otherwise without complaint today.   Past Medical History:  Diagnosis Date  . Coronary artery disease    1980's-90's MI with perhaps angioplasty  . Hypertension    Past Surgical History:  Procedure Laterality Date  . CORONARY ANGIOPLASTY     "Dr. Vidal Schwalbe"  . HEMATOMA EVACUATION Left 08/15/2016   Procedure: INSERTION OF DRAIN ANTERIOR LEFT CHEST WALL HEMATOMA;  Surgeon: Ivin Poot, MD;  Location: [place];  Service: Thoracic;  Laterality: Left;  . LAPAROTOMY N/A 08/04/2016   Procedure: EXPLORATORY LAPAROTOMY FREE AIR BIOPSY OF PERFORATED STOMACH ULCER REPAIR OF PERFORATED STOMACH ULCER WITH A OMENTAL PATCH;  Surgeon: Georganna Skeans, MD;  Location: [place];  Service: General;  Laterality: N/A;  . RIGHT/LEFT HEART CATH AND CORONARY ANGIOGRAPHY N/A 08/13/2016   Procedure: Right/Left Heart Cath and Coronary Angiography;  Surgeon: Jolaine Artist, MD;  Location: [facility];  Service: Cardiovascular;  Laterality: N/A;    ROS- all systems are reviewed and negatives except as per HPI above  Current Outpatient Prescriptions  Medication Sig Dispense Refill  . acetaminophen (TYLENOL) 325 MG tablet Take 2 tablets (650 mg total) by mouth every 4 (four) hours as needed for  mild pain or headache.    Marland Kitchen aspirin EC 81 MG tablet Take 1 tablet (81 mg total) by mouth daily. 30 tablet 0  . carvedilol (COREG) 6.25 MG tablet Take 1 tablet (6.25 mg total) by mouth 2 (two) times daily with a meal. 60 tablet 5  . eplerenone (INSPRA) 25 MG tablet Take 1 tablet (25 mg total) by mouth daily. 30 tablet 3  . rosuvastatin (CRESTOR) 10 MG tablet Take 1 tablet (10 mg total) by mouth daily. 30 tablet 5  . sacubitril-valsartan (ENTRESTO) 49-51 MG Take 1 tablet by mouth 2 (two) times daily. 180 tablet 3  . traMADol (ULTRAM) 50 MG tablet Take 50 mg by mouth every 6 (six) hours as needed (Pain).     Marland Kitchen warfarin (COUMADIN) 2 MG tablet Take as directed by Coumadin Clinic 30 tablet 1  . folic acid (FOLVITE) 1 MG tablet Take 1 tablet (1 mg total) by mouth daily. (Patient not taking: Reported on 10/13/2016) 30 tablet 0  . magnesium oxide (MAG-OX) 400 (241.3 Mg) MG tablet Take 1 tablet (400 mg total) by mouth daily. (Patient not taking: Reported on 10/13/2016) 30 tablet 0   No current facility-administered medications for this visit.     Physical Exam: Vitals:   10/13/16 0858  BP: 124/70  Pulse: 73  SpO2: 98%  Weight: 172 lb (78 kg)  Height: 5\' 8"  (1.727 m)    GEN- The patient is well appearing, alert and oriented x 3 today.   Head- normocephalic, atraumatic Eyes-  Sclera clear, conjunctiva pink Ears- hearing intact Oropharynx- clear  Lungs- Clear to ausculation bilaterally, normal work of breathing Heart- Regular rate and rhythm, no murmurs, rubs or gallops, PMI not laterally displaced GI- soft, NT, ND, + BS Extremities- no clubbing, cyanosis, or edema  EKG tracing ordered today is personally reviewed and shows sinus rhythm 65 bpm, PR 132 msec, QRS 88 msec, Qtc 411 msec, anterior infarction, inferior infarction  Assessment and Plan:  1. Cardiac Arrest/ VF arrest Clinically much improved Occurred in the setting of known ischemic CM and chronically depressed EF At this time, he  meets criteria for ICD implantation for secondary prevention of sudden death.  Given QRS < 130 msec, he does not meet criteria for CRT.  Risks, benefits, alternatives to ICD implantation were discussed in detail with the patient today. The patient  understands that the risks include but are not limited to bleeding, infection, pneumothorax, perforation, tamponade, vascular damage, renal failure, MI, stroke, death, inappropriate shocks, and lead dislodgement and wishes to proceed.  We will therefore schedule device implantation at the next available time.  No driving x 6 months reinforced today.  Given prior L chest hematoma with surgery, we will plan venogram prior to initiation of the procedure to confirm that his L subclavian system is intact.  2. CAD No ischemic symptoms Medical therapy is advised  3. Ischemic CM euvolemic  Followed in CHF clinic On good medical regimen  4. LV thrombus Continue anticoagulation for now.  CHF clinic plans repeat echo in 2 months or so and hopefully stopping anticoagulation at that time.  He should be safe for DFT testing as he has been anticoagulated for at least 4 weeks.  Hold coumadin for 2 days prior to ICD implantation.  5. Tobacco use Cessation advised  Thompson Grayer MD, [facility] 10/13/2016 9:49 AM

## 2016-10-13 NOTE — Patient Instructions (Addendum)
Medication Instructions:  Your physician recommends that you continue on your current medications as directed. Please refer to the Current Medication list given to you today.   Labwork:  Your physician recommends that you return for lab work on 10/17/16: BMP/CBC.  Get these drawn after Coumadin visit.  You do not have to fast. INR wfill be checked by Coumadin Clinic Will need another INR on Mon 10/27/16   Testing/Procedures: Your physician has recommended that you have a defibrillator inserted. An implantable cardioverter defibrillator (ICD) is a small device that is placed in your chest or, in rare cases, your abdomen. This device uses electrical pulses or shocks to help control life-threatening, irregular heartbeats that could lead the heart to suddenly stop beating (sudden cardiac arrest). Leads are attached to the ICD that goes into your heart. This is done in the hospital and usually requires an overnight stay. Please see the instruction sheet given to you today for more information.---10/29/16  Please arrive at [facility] at 8:30am Do not eat or drink after midnight the night prior to the procedure Do not take any medications the morning of the test Take your last dose of Coumadin on 10/26/16 Plan for one night stay Will need someone to drive you home at discharge    Follow-Up: Your physician recommends that you schedule a follow-up appointment in: 10-14 days from 10/29/16 in device clinic for wound check and 3 months from 10/29/16 with Dr Rayann Heman   Cardioverter Defibrillator Implantation An implantable cardioverter defibrillator (ICD) is a small device that is placed under the skin in the chest or abdomen. An ICD consists of a battery, a small computer (pulse generator), and wires (leads) that go into the heart. An ICD is used to detect and correct two types of dangerous irregular heartbeats (arrhythmias):  A rapid heart rhythm  (tachycardia).  An arrhythmia in which the lower chambers of the heart (ventricles) contract in an uncoordinated way (fibrillation).  When an ICD detects tachycardia, it sends a low-energy shock to the heart to restore the heartbeat to normal (cardioversion). This signal is usually painless. If cardioversion does not work or if the ICD detects fibrillation, it delivers a high-energy shock to the heart (defibrillation) to restart the heart. This shock may feel like a strong jolt in the chest. Your health care provider may prescribe an ICD if:  You have had an arrhythmia that originated in the ventricles.  Your heart has been damaged by a disease or heart condition.  Sometimes, ICDs are programmed to act as a device called a pacemaker. Pacemakers can be used to treat a slow heartbeat (bradycardia) or tachycardia by taking over the heart rate with electrical impulses. Tell a health care provider about:  Any allergies you have.  All medicines you are taking, including vitamins, herbs, eye drops, creams, and over-the-counter medicines.  Any problems you or family members have had with anesthetic medicines.  Any blood disorders you have.  Any surgeries you have had.  Any medical conditions you have.  Whether you are pregnant or may be pregnant. What are the risks? Generally, this is a safe procedure. However, problems may occur, including:  Swelling, bleeding, or bruising.  Infection.  Blood clots.  Damage to other structures or organs, such as nerves, blood vessels, or the heart.  Allergic reactions to medicines used during the procedure.  What happens before the procedure? Staying hydrated Follow instructions from your health care provider about hydration, which may  include:  Up to 2 hours before the procedure - you may continue to drink clear liquids, such as water, clear fruit juice, black coffee, and plain tea.  Eating and drinking restrictions Follow instructions from  your health care provider about eating and drinking, which may include:  8 hours before the procedure - stop eating heavy meals or foods such as meat, fried foods, or fatty foods.  6 hours before the procedure - stop eating light meals or foods, such as toast or cereal.  6 hours before the procedure - stop drinking milk or drinks that contain milk.  2 hours before the procedure - stop drinking clear liquids.  Medicine Ask your health care provider about:  Changing or stopping your normal medicines. This is important if you take diabetes medicines or blood thinners.  Taking medicines such as aspirin and ibuprofen. These medicines can thin your blood. Do not take these medicines before your procedure if your doctor tells you not to.  Tests  You may have blood tests.  You may have a test to check the electrical signals in your heart (electrocardiogram, ECG).  You may have imaging tests, such as a chest X-ray. General instructions  For 24 hours before the procedure, stop using products that contain nicotine or tobacco, such as cigarettes and e-cigarettes. If you need help quitting, ask your health care provider.  Plan to have someone take you home from the hospital or clinic.  You may be asked to shower with a germ-killing soap. What happens during the procedure?  To reduce your risk of infection: ? Your health care team will wash or sanitize their hands. ? Your skin will be washed with soap. ? Hair may be removed from the surgical area.  Small monitors will be put on your body. They will be used to check your heart, blood pressure, and oxygen level.  An IV tube will be inserted into one of your veins.  You will be given one or more of the following: ? A medicine to help you relax (sedative). ? A medicine to numb the area (local anesthetic). ? A medicine to make you fall asleep (general anesthetic).  Leads will be guided through a blood vessel into your heart and attached  to your heart muscles. Depending on the ICD, the leads may go into one ventricle or they may go into both ventricles and into an upper chamber of the heart. An X-ray machine (fluoroscope) will be usedto help guide the leads.  A small incision will be made to create a deep pocket under your skin.  The pulse generator will be placed into the pocket.  The ICD will be tested.  The incision will be closed with stitches (sutures), skin glue, or staples.  A bandage (dressing) will be placed over the incision. This procedure may vary among health care providers and hospitals. What happens after the procedure?  Your blood pressure, heart rate, breathing rate, and blood oxygen level will be monitored often until the medicines you were given have worn off.  A chest X-ray will be taken to check that the ICD is in the right place.  You will need to stay in the hospital for 1-2 days so your health care provider can make sure your ICD is working.  Do not drive for 24 hours if you received a sedative. Ask your health care provider when it is safe for you to drive.  You may be given an identification card explaining that you have an  ICD. Summary  An implantable cardioverter defibrillator (ICD) is a small device that is placed under the skin in the chest or abdomen. It is used to detect and correct dangerous irregular heartbeats (arrhythmias).  An ICD consists of a battery, a small computer (pulse generator), and wires (leads) that go into the heart.  When an ICD detects rapid heart rhythm (tachycardia), it sends a low-energy shock to the heart to restore the heartbeat to normal (cardioversion). If cardioversion does not work or if the ICD detects uncoordinated heart contractions (fibrillation), it delivers a high-energy shock to the heart (defibrillation) to restart the heart.  You will need to stay in the hospital for 1-2 days to make sure your ICD is working. This information is not intended to  replace advice given to you by your health care provider. Make sure you discuss any questions you have with your health care provider. Document Released: 09/21/2001 Document Revised: 01/09/2016 Document Reviewed: 01/09/2016 Elsevier Interactive Patient Education  2017 [employer].

## 2016-10-17 ENCOUNTER — Other Ambulatory Visit: Payer: Self-pay | Admitting: *Deleted

## 2016-10-17 ENCOUNTER — Other Ambulatory Visit: Payer: PPO

## 2016-10-17 ENCOUNTER — Telehealth: Payer: Self-pay | Admitting: Internal Medicine

## 2016-10-17 ENCOUNTER — Ambulatory Visit (INDEPENDENT_AMBULATORY_CARE_PROVIDER_SITE_OTHER): Payer: PPO

## 2016-10-17 DIAGNOSIS — I240 Acute coronary thrombosis not resulting in myocardial infarction: Secondary | ICD-10-CM

## 2016-10-17 DIAGNOSIS — I469 Cardiac arrest, cause unspecified: Secondary | ICD-10-CM | POA: Diagnosis not present

## 2016-10-17 DIAGNOSIS — I5022 Chronic systolic (congestive) heart failure: Secondary | ICD-10-CM | POA: Diagnosis not present

## 2016-10-17 DIAGNOSIS — I513 Intracardiac thrombosis, not elsewhere classified: Secondary | ICD-10-CM | POA: Diagnosis not present

## 2016-10-17 DIAGNOSIS — E875 Hyperkalemia: Secondary | ICD-10-CM

## 2016-10-17 DIAGNOSIS — Z5181 Encounter for therapeutic drug level monitoring: Secondary | ICD-10-CM

## 2016-10-17 DIAGNOSIS — I255 Ischemic cardiomyopathy: Secondary | ICD-10-CM

## 2016-10-17 LAB — CBC WITH DIFFERENTIAL/PLATELET
BASOS: 1 %
Basophils Absolute: 0 10*3/uL (ref 0.0–0.2)
EOS (ABSOLUTE): 0.2 10*3/uL (ref 0.0–0.4)
EOS: 3 %
HEMATOCRIT: 38.6 % (ref 37.5–51.0)
HEMOGLOBIN: 13.1 g/dL (ref 13.0–17.7)
Immature Grans (Abs): 0 10*3/uL (ref 0.0–0.1)
Immature Granulocytes: 0 %
LYMPHS ABS: 2.9 10*3/uL (ref 0.7–3.1)
Lymphs: 43 %
MCH: 32.6 pg (ref 26.6–33.0)
MCHC: 33.9 g/dL (ref 31.5–35.7)
MCV: 96 fL (ref 79–97)
MONOCYTES: 11 %
Monocytes Absolute: 0.8 10*3/uL (ref 0.1–0.9)
NEUTROS ABS: 2.9 10*3/uL (ref 1.4–7.0)
Neutrophils: 42 %
Platelets: 296 10*3/uL (ref 150–379)
RBC: 4.02 x10E6/uL — ABNORMAL LOW (ref 4.14–5.80)
RDW: 15.1 % (ref 12.3–15.4)
WBC: 6.8 10*3/uL (ref 3.4–10.8)

## 2016-10-17 LAB — BASIC METABOLIC PANEL WITH GFR
BUN/Creatinine Ratio: 9 — ABNORMAL LOW (ref 10–24)
BUN: 9 mg/dL (ref 8–27)
CO2: 22 mmol/L (ref 20–29)
Calcium: 9.3 mg/dL (ref 8.6–10.2)
Chloride: 96 mmol/L (ref 96–106)
Creatinine, Ser: 0.95 mg/dL (ref 0.76–1.27)
GFR calc Af Amer: 95 mL/min/1.73
GFR calc non Af Amer: 82 mL/min/1.73
Glucose: 98 mg/dL (ref 65–99)
Potassium: 5.8 mmol/L — ABNORMAL HIGH (ref 3.5–5.2)
Sodium: 132 mmol/L — ABNORMAL LOW (ref 134–144)

## 2016-10-17 LAB — POCT INR: INR: 2.1

## 2016-10-17 NOTE — Telephone Encounter (Signed)
Walk In Pt Form-Questions about LifeVest. Placed in [facility] Doc Box.

## 2016-10-19 DIAGNOSIS — I469 Cardiac arrest, cause unspecified: Secondary | ICD-10-CM | POA: Diagnosis not present

## 2016-10-19 DIAGNOSIS — I472 Ventricular tachycardia: Secondary | ICD-10-CM | POA: Diagnosis not present

## 2016-10-19 DIAGNOSIS — I4901 Ventricular fibrillation: Secondary | ICD-10-CM | POA: Diagnosis not present

## 2016-10-20 DIAGNOSIS — R1310 Dysphagia, unspecified: Secondary | ICD-10-CM | POA: Diagnosis not present

## 2016-10-20 DIAGNOSIS — K5900 Constipation, unspecified: Secondary | ICD-10-CM | POA: Diagnosis not present

## 2016-10-20 DIAGNOSIS — I469 Cardiac arrest, cause unspecified: Secondary | ICD-10-CM | POA: Diagnosis not present

## 2016-10-20 DIAGNOSIS — F1010 Alcohol abuse, uncomplicated: Secondary | ICD-10-CM | POA: Diagnosis not present

## 2016-10-27 ENCOUNTER — Other Ambulatory Visit: Payer: PPO

## 2016-10-27 ENCOUNTER — Ambulatory Visit (INDEPENDENT_AMBULATORY_CARE_PROVIDER_SITE_OTHER): Payer: PPO | Admitting: *Deleted

## 2016-10-27 DIAGNOSIS — Z5181 Encounter for therapeutic drug level monitoring: Secondary | ICD-10-CM | POA: Diagnosis not present

## 2016-10-27 DIAGNOSIS — I240 Acute coronary thrombosis not resulting in myocardial infarction: Secondary | ICD-10-CM

## 2016-10-27 DIAGNOSIS — I513 Intracardiac thrombosis, not elsewhere classified: Secondary | ICD-10-CM

## 2016-10-27 LAB — POCT INR: INR: 2

## 2016-10-27 MED ORDER — WARFARIN SODIUM 2 MG PO TABS: ORAL_TABLET | ORAL | 1 refills | Status: DC

## 2016-10-29 ENCOUNTER — Encounter (HOSPITAL_COMMUNITY)
Admission: RE | Disposition: A | Discharge status: Home / Self Care | Payer: Self-pay | Source: Ambulatory Visit | Attending: Internal Medicine

## 2016-10-29 ENCOUNTER — Ambulatory Visit (HOSPITAL_COMMUNITY)
Admission: RE | Admit: 2016-10-29 | Discharge: 2016-10-30 | Disposition: A | Discharge status: Home / Self Care | Payer: PPO | Source: Ambulatory Visit | Attending: Internal Medicine | Admitting: Internal Medicine

## 2016-10-29 ENCOUNTER — Encounter (HOSPITAL_COMMUNITY): Payer: Self-pay | Admitting: Internal Medicine

## 2016-10-29 ENCOUNTER — Ambulatory Visit (HOSPITAL_COMMUNITY): Payer: PPO

## 2016-10-29 DIAGNOSIS — I5022 Chronic systolic (congestive) heart failure: Secondary | ICD-10-CM | POA: Diagnosis not present

## 2016-10-29 DIAGNOSIS — I2510 Atherosclerotic heart disease of native coronary artery without angina pectoris: Secondary | ICD-10-CM | POA: Diagnosis not present

## 2016-10-29 DIAGNOSIS — Z006 Encounter for examination for normal comparison and control in clinical research program: Secondary | ICD-10-CM | POA: Insufficient documentation

## 2016-10-29 DIAGNOSIS — I4901 Ventricular fibrillation: Secondary | ICD-10-CM | POA: Diagnosis not present

## 2016-10-29 DIAGNOSIS — I9581 Postprocedural hypotension: Secondary | ICD-10-CM | POA: Diagnosis not present

## 2016-10-29 DIAGNOSIS — Z7901 Long term (current) use of anticoagulants: Secondary | ICD-10-CM | POA: Diagnosis not present

## 2016-10-29 DIAGNOSIS — I255 Ischemic cardiomyopathy: Secondary | ICD-10-CM | POA: Diagnosis not present

## 2016-10-29 DIAGNOSIS — Z8674 Personal history of sudden cardiac arrest: Secondary | ICD-10-CM | POA: Diagnosis not present

## 2016-10-29 DIAGNOSIS — Z79899 Other long term (current) drug therapy: Secondary | ICD-10-CM | POA: Diagnosis not present

## 2016-10-29 DIAGNOSIS — Z955 Presence of coronary angioplasty implant and graft: Secondary | ICD-10-CM | POA: Insufficient documentation

## 2016-10-29 DIAGNOSIS — Z7982 Long term (current) use of aspirin: Secondary | ICD-10-CM | POA: Diagnosis not present

## 2016-10-29 DIAGNOSIS — I252 Old myocardial infarction: Secondary | ICD-10-CM | POA: Insufficient documentation

## 2016-10-29 DIAGNOSIS — I110 Hypertensive heart disease with heart failure: Secondary | ICD-10-CM | POA: Diagnosis not present

## 2016-10-29 DIAGNOSIS — Z959 Presence of cardiac and vascular implant and graft, unspecified: Secondary | ICD-10-CM

## 2016-10-29 DIAGNOSIS — Z95810 Presence of automatic (implantable) cardiac defibrillator: Secondary | ICD-10-CM

## 2016-10-29 HISTORY — DX: Presence of automatic (implantable) cardiac defibrillator: Z95.810

## 2016-10-29 HISTORY — DX: Acute myocardial infarction, unspecified: I21.9

## 2016-10-29 HISTORY — PX: ICD IMPLANT: EP1208

## 2016-10-29 LAB — PROTIME-INR
INR: 1.28
Prothrombin Time: 15.9 seconds — ABNORMAL HIGH (ref 11.4–15.2)

## 2016-10-29 LAB — SURGICAL PCR SCREEN
MRSA, PCR: NEGATIVE
STAPHYLOCOCCUS AUREUS: NEGATIVE

## 2016-10-29 LAB — POTASSIUM: POTASSIUM: 4.5 mmol/L (ref 3.5–5.1)

## 2016-10-29 SURGERY — ICD IMPLANT
Anesthesia: LOCAL

## 2016-10-29 MED ORDER — SODIUM CHLORIDE 0.9 % IV SOLN
INTRAVENOUS | Status: DC
  Administered 2016-10-29: 09:00:00 via INTRAVENOUS

## 2016-10-29 MED ORDER — FLUMAZENIL 0.5 MG/5ML IV SOLN
INTRAVENOUS | Status: AC
  Filled 2016-10-29: qty 5

## 2016-10-29 MED ORDER — FENTANYL CITRATE (PF) 100 MCG/2ML IJ SOLN
INTRAMUSCULAR | Status: DC | PRN
  Administered 2016-10-29 (×3): 25 ug via INTRAVENOUS

## 2016-10-29 MED ORDER — FENTANYL CITRATE (PF) 100 MCG/2ML IJ SOLN
INTRAMUSCULAR | Status: AC
  Filled 2016-10-29: qty 2

## 2016-10-29 MED ORDER — ASPIRIN EC 81 MG PO TBEC
81.0000 mg | DELAYED_RELEASE_TABLET | Freq: Every day | ORAL | Status: DC
  Filled 2016-10-29: qty 1

## 2016-10-29 MED ORDER — SODIUM CHLORIDE 0.9 % IV SOLN: 250.0000 mL | INTRAVENOUS | Status: DC | PRN

## 2016-10-29 MED ORDER — HEPARIN (PORCINE) IN NACL 2-0.9 UNIT/ML-% IJ SOLN
INTRAMUSCULAR | Status: AC | PRN
  Administered 2016-10-29: 500 mL

## 2016-10-29 MED ORDER — MIDAZOLAM HCL 5 MG/5ML IJ SOLN
INTRAMUSCULAR | Status: AC
  Filled 2016-10-29: qty 5

## 2016-10-29 MED ORDER — MUPIROCIN 2 % EX OINT
TOPICAL_OINTMENT | CUTANEOUS | Status: AC
  Administered 2016-10-29: 1 via TOPICAL
  Filled 2016-10-29: qty 22

## 2016-10-29 MED ORDER — INFLUENZA VAC SPLIT HIGH-DOSE 0.5 ML IM SUSY
0.5000 mL | PREFILLED_SYRINGE | INTRAMUSCULAR | Status: DC
  Filled 2016-10-29 (×2): qty 0.5

## 2016-10-29 MED ORDER — SODIUM CHLORIDE 0.9 % IR SOLN
Status: AC
  Filled 2016-10-29: qty 2

## 2016-10-29 MED ORDER — BUPIVACAINE HCL (PF) 0.25 % IJ SOLN
INTRAMUSCULAR | Status: AC
  Filled 2016-10-29: qty 60

## 2016-10-29 MED ORDER — MAGNESIUM OXIDE 400 (241.3 MG) MG PO TABS: 400.0000 mg | ORAL_TABLET | Freq: Every day | ORAL | Status: DC

## 2016-10-29 MED ORDER — NALOXONE HCL 0.4 MG/ML IJ SOLN
0.4000 mg | INTRAMUSCULAR | Status: DC | PRN
  Administered 2016-10-29: 0.4 mg via INTRAVENOUS

## 2016-10-29 MED ORDER — BUPIVACAINE HCL (PF) 0.25 % IJ SOLN
INTRAMUSCULAR | Status: DC | PRN
  Administered 2016-10-29: 35 mL

## 2016-10-29 MED ORDER — HYDROCODONE-ACETAMINOPHEN 5-325 MG PO TABS
1.0000 | ORAL_TABLET | ORAL | Status: DC | PRN
  Administered 2016-10-30: 1 via ORAL
  Administered 2016-10-30: 03:00:00 2 via ORAL
  Filled 2016-10-29: qty 1
  Filled 2016-10-29: qty 2

## 2016-10-29 MED ORDER — SODIUM CHLORIDE 0.9 % IV BOLUS (SEPSIS)
1000.0000 mL | Freq: Once | INTRAVENOUS | Status: AC
  Administered 2016-10-29: 15:00:00 1000 mL via INTRAVENOUS

## 2016-10-29 MED ORDER — HEPARIN (PORCINE) IN NACL 2-0.9 UNIT/ML-% IJ SOLN
INTRAMUSCULAR | Status: AC
  Filled 2016-10-29: qty 500

## 2016-10-29 MED ORDER — ONDANSETRON HCL 4 MG/2ML IJ SOLN: 4.0000 mg | Freq: Four times a day (QID) | INTRAMUSCULAR | Status: DC | PRN

## 2016-10-29 MED ORDER — WARFARIN - PHYSICIAN DOSING INPATIENT: Freq: Every day | Status: DC

## 2016-10-29 MED ORDER — MIDAZOLAM HCL 5 MG/5ML IJ SOLN
INTRAMUSCULAR | Status: AC
Start: 2016-10-29 — End: 2016-10-29
  Filled 2016-10-29: qty 5

## 2016-10-29 MED ORDER — MIDAZOLAM HCL 5 MG/5ML IJ SOLN
INTRAMUSCULAR | Status: DC | PRN
  Administered 2016-10-29: 1 mg via INTRAVENOUS
  Administered 2016-10-29 (×4): 2 mg via INTRAVENOUS

## 2016-10-29 MED ORDER — SODIUM CHLORIDE 0.9 % IV SOLN
0.0000 ug/min | INTRAVENOUS | Status: DC
  Filled 2016-10-29: qty 1

## 2016-10-29 MED ORDER — CHLORHEXIDINE GLUCONATE 4 % EX LIQD: 60.0000 mL | Freq: Once | CUTANEOUS | Status: DC

## 2016-10-29 MED ORDER — SODIUM CHLORIDE 0.9 % IR SOLN
80.0000 mg | Status: AC
  Administered 2016-10-29: 80 mg

## 2016-10-29 MED ORDER — CARVEDILOL 3.125 MG PO TABS
6.2500 mg | ORAL_TABLET | Freq: Two times a day (BID) | ORAL | Status: DC
Start: 2016-10-29 — End: 2016-10-30
  Filled 2016-10-29: qty 1

## 2016-10-29 MED ORDER — NALOXONE HCL 0.4 MG/ML IJ SOLN
INTRAMUSCULAR | Status: AC
  Filled 2016-10-29: qty 1

## 2016-10-29 MED ORDER — IOPAMIDOL (ISOVUE-370) INJECTION 76%
INTRAVENOUS | Status: DC | PRN
  Administered 2016-10-29: 15 mL via INTRAVENOUS

## 2016-10-29 MED ORDER — IOPAMIDOL (ISOVUE-370) INJECTION 76%
INTRAVENOUS | Status: AC
  Filled 2016-10-29: qty 50

## 2016-10-29 MED ORDER — WARFARIN SODIUM 5 MG PO TABS
5.0000 mg | ORAL_TABLET | Freq: Once | ORAL | Status: AC
  Administered 2016-10-29: 5 mg via ORAL
  Filled 2016-10-29: qty 1

## 2016-10-29 MED ORDER — ENSURE ENLIVE PO LIQD
237.0000 mL | Freq: Two times a day (BID) | ORAL | Status: DC
  Filled 2016-10-29 (×4): qty 237

## 2016-10-29 MED ORDER — CEFAZOLIN SODIUM-DEXTROSE 2-4 GM/100ML-% IV SOLN
2.0000 g | INTRAVENOUS | Status: AC
  Administered 2016-10-29: 2 g via INTRAVENOUS

## 2016-10-29 MED ORDER — CEFAZOLIN SODIUM-DEXTROSE 2-4 GM/100ML-% IV SOLN
INTRAVENOUS | Status: AC
  Filled 2016-10-29: qty 100

## 2016-10-29 MED ORDER — SACUBITRIL-VALSARTAN 49-51 MG PO TABS
1.0000 | ORAL_TABLET | Freq: Two times a day (BID) | ORAL | Status: DC
  Filled 2016-10-29: qty 1

## 2016-10-29 MED ORDER — SODIUM CHLORIDE 0.9 % IV BOLUS (SEPSIS): 1000.0000 mL | Freq: Once | INTRAVENOUS | Status: AC

## 2016-10-29 MED ORDER — SODIUM CHLORIDE 0.9% FLUSH
3.0000 mL | Freq: Two times a day (BID) | INTRAVENOUS | Status: DC
  Administered 2016-10-29 – 2016-10-30 (×2): 3 mL via INTRAVENOUS

## 2016-10-29 MED ORDER — SODIUM CHLORIDE 0.9% FLUSH: 3.0000 mL | INTRAVENOUS | Status: DC | PRN

## 2016-10-29 MED ORDER — OFF THE BEAT BOOK
Freq: Once | Status: AC
  Administered 2016-10-29: 22:00:00 1
  Filled 2016-10-29: qty 1

## 2016-10-29 MED ORDER — CEFAZOLIN SODIUM-DEXTROSE 1-4 GM/50ML-% IV SOLN
1.0000 g | Freq: Four times a day (QID) | INTRAVENOUS | Status: AC
  Administered 2016-10-29 – 2016-10-30 (×3): 1 g via INTRAVENOUS
  Filled 2016-10-29 (×3): qty 50

## 2016-10-29 MED ORDER — ACETAMINOPHEN 325 MG PO TABS
325.0000 mg | ORAL_TABLET | ORAL | Status: DC | PRN
  Administered 2016-10-30: 650 mg via ORAL
  Filled 2016-10-29: qty 2

## 2016-10-29 MED ORDER — FLUMAZENIL 0.5 MG/5ML IV SOLN
0.2000 mg | Freq: Once | INTRAVENOUS | Status: AC
  Administered 2016-10-29: 0.2 mg via INTRAVENOUS

## 2016-10-29 MED ORDER — SODIUM CHLORIDE 0.9 % IV SOLN: INTRAVENOUS | Status: DC

## 2016-10-29 MED ORDER — MUPIROCIN 2 % EX OINT
1.0000 "application " | TOPICAL_OINTMENT | Freq: Once | CUTANEOUS | Status: AC
  Administered 2016-10-29: 1 via TOPICAL

## 2016-10-29 SURGICAL SUPPLY — 6 items
CABLE SURGICAL S-101-97-12 (CABLE) ×1 IMPLANT
ICD ELLIPSE VR CD1411-36Q (ICD Generator) ×1 IMPLANT
LEAD DURATA 7122Q-58CM (Lead) ×1 IMPLANT
PAD DEFIB LIFELINK (PAD) ×1 IMPLANT
SHEATH CLASSIC 7F (SHEATH) ×1 IMPLANT
TRAY PACEMAKER INSERTION (PACKS) ×1 IMPLANT

## 2016-10-29 NOTE — Progress Notes (Signed)
The patient had post operative hypotension after ICD implantation today.  Upon arrival to the floor this afternoon, he was noted to be in afib with V rates 160s and SBP 60s.  Emergent bedside echo was performed by me which revealed no effusion.  Echo showed IVC collapse and the RV appeared under filled.  EF was moderately depressed.  He appeared to be dry and therefore was given 2L IV NS.  I had Dr Marlou Porch perform echo at bedside also and he confirmed my findings.  Given hemodynamic compromise with afib with RVR, the patient underwent emergent bedside cardioversion performed by me.  A single 200J biphasic synchronized shock successfully restored sinus rhythm. No sedation was given for this emergent procedure given hypotensive crisis.  In addition the patient received empiric reversal of his Versed/ Fentanyl which were given earlier in the day with his ICD implantation procedure to provide additional BP support.  He did slowly recover and did not require pressors.  Portable chest X ray was performed which revealed no ptx or hemothorax.  He did not have any respiratory compromise or symptoms of SOB.  No neurologic findings were observed. The patient was followed closely throughout the afternoon by myself and Dr Marlou Porch with slow improvement in clinical state with maintenance of sinus rhythm and gradual resolution of profound hypotension. He denies CP, SOB, or other concerns.  Thompson Grayer MD, [facility] 10/29/2016

## 2016-10-29 NOTE — Discharge Instructions (Signed)
° ° °  Supplemental Discharge Instructions for  Pacemaker/Defibrillator Patients  Activity No heavy lifting or vigorous activity with your left/right arm for 6 to 8 weeks.  Do not raise your left/right arm above your head for one week.  Gradually raise your affected arm as drawn below.           __       11/02/16                11/03/16                 11/04/16                 11/05/16  NO DRIVING for   6 months post arrest    WOUND CARE - Keep the wound area clean and dry.  Do not get this area wet for one week. No showers for one week; you may shower on    11/05/16 . - The tape/steri-strips on your wound will fall off; do not pull them off.  No bandage is needed on the site.  DO  NOT apply any creams, oils, or ointments to the wound area. - If you notice any drainage or discharge from the wound, any swelling or bruising at the site, or you develop a fever > 101? F after you are discharged home, call the office at once.  Special Instructions - You are still able to use cellular telephones; use the ear opposite the side where you have your pacemaker/defibrillator.  Avoid carrying your cellular phone near your device. - When traveling through airports, show security personnel your identification card to avoid being screened in the metal detectors.  Ask the security personnel to use the hand wand. - Avoid arc welding equipment, TENS units (transcutaneous nerve stimulators).  Call the office for questions about other devices. - Avoid electrical appliances that are in poor condition or are not properly grounded. - Microwave ovens are safe to be near or to operate.  Additional information for defibrillator patients should your device go off: - If your device goes off ONCE and you feel fine afterward, notify the device clinic nurses. - If your device goes off ONCE and you do not feel well afterward, call 911. - If your device goes off TWICE, call 911. - If your device goes off THREE times in one  day, call 911.  DO NOT DRIVE YOURSELF OR A FAMILY MEMBER WITH A DEFIBRILLATOR TO THE HOSPITAL--CALL 911.

## 2016-10-29 NOTE — H&P (View-Only) (Signed)
PCP: [facility] Cardiologist: Dr Haroldine Laws (previously Dr Doreatha Lew) Primary EP: Dr Darryll Capers is a 66 y.o. male who presents today for routine electrophysiology followup. He is s/p VF arrest with very complicated hospitalization.  He has made very slow progress.  He is returning to his prior health state and appears to be making good recovery.  He has lifevest in place and is not aware of any shocks delivered.  He is slowly becoming more active. Since last being seen in our clinic, the patient reports doing very well.  Today, he denies symptoms of palpitations, chest pain, shortness of breath,  lower extremity edema, dizziness, presyncope, or syncope.  The patient is otherwise without complaint today.   Past Medical History:  Diagnosis Date  . Coronary artery disease    1980's-90's MI with perhaps angioplasty  . Hypertension    Past Surgical History:  Procedure Laterality Date  . CORONARY ANGIOPLASTY     "Dr. Vidal Schwalbe"  . HEMATOMA EVACUATION Left 08/15/2016   Procedure: INSERTION OF DRAIN ANTERIOR LEFT CHEST WALL HEMATOMA;  Surgeon: Ivin Poot, MD;  Location: [facility];  Service: Thoracic;  Laterality: Left;  . LAPAROTOMY N/A 08/04/2016   Procedure: EXPLORATORY LAPAROTOMY FREE AIR BIOPSY OF PERFORATED STOMACH ULCER REPAIR OF PERFORATED STOMACH ULCER WITH A OMENTAL PATCH;  Surgeon: Georganna Skeans, MD;  Location: [facility];  Service: General;  Laterality: N/A;  . RIGHT/LEFT HEART CATH AND CORONARY ANGIOGRAPHY N/A 08/13/2016   Procedure: Right/Left Heart Cath and Coronary Angiography;  Surgeon: Jolaine Artist, MD;  Location: [facility];  Service: Cardiovascular;  Laterality: N/A;    ROS- all systems are reviewed and negatives except as per HPI above  Current Outpatient Prescriptions  Medication Sig Dispense Refill  . acetaminophen (TYLENOL) 325 MG tablet Take 2 tablets (650 mg total) by mouth every 4 (four) hours as needed for  mild pain or headache.    Marland Kitchen aspirin EC 81 MG tablet Take 1 tablet (81 mg total) by mouth daily. 30 tablet 0  . carvedilol (COREG) 6.25 MG tablet Take 1 tablet (6.25 mg total) by mouth 2 (two) times daily with a meal. 60 tablet 5  . eplerenone (INSPRA) 25 MG tablet Take 1 tablet (25 mg total) by mouth daily. 30 tablet 3  . rosuvastatin (CRESTOR) 10 MG tablet Take 1 tablet (10 mg total) by mouth daily. 30 tablet 5  . sacubitril-valsartan (ENTRESTO) 49-51 MG Take 1 tablet by mouth 2 (two) times daily. 180 tablet 3  . traMADol (ULTRAM) 50 MG tablet Take 50 mg by mouth every 6 (six) hours as needed (Pain).     Marland Kitchen warfarin (COUMADIN) 2 MG tablet Take as directed by Coumadin Clinic 30 tablet 1  . folic acid (FOLVITE) 1 MG tablet Take 1 tablet (1 mg total) by mouth daily. (Patient not taking: Reported on 10/13/2016) 30 tablet 0  . magnesium oxide (MAG-OX) 400 (241.3 Mg) MG tablet Take 1 tablet (400 mg total) by mouth daily. (Patient not taking: Reported on 10/13/2016) 30 tablet 0   No current facility-administered medications for this visit.     Physical Exam: Vitals:   10/13/16 0858  BP: 124/70  Pulse: 73  SpO2: 98%  Weight: 172 lb (78 kg)  Height: 5\' 8"  (1.727 m)    GEN- The patient is well appearing, alert and oriented x 3 today.   Head- normocephalic, atraumatic Eyes-  Sclera clear, conjunctiva pink Ears- hearing intact Oropharynx- clear  Lungs- Clear to ausculation bilaterally, normal work of breathing Heart- Regular rate and rhythm, no murmurs, rubs or gallops, PMI not laterally displaced GI- soft, NT, ND, + BS Extremities- no clubbing, cyanosis, or edema  EKG tracing ordered today is personally reviewed and shows sinus rhythm 65 bpm, PR 132 msec, QRS 88 msec, Qtc 411 msec, anterior infarction, inferior infarction  Assessment and Plan:  1. Cardiac Arrest/ VF arrest Clinically much improved Occurred in the setting of known ischemic CM and chronically depressed EF At this time, he  meets criteria for ICD implantation for secondary prevention of sudden death.  Given QRS < 130 msec, he does not meet criteria for CRT.  Risks, benefits, alternatives to ICD implantation were discussed in detail with the patient today. The patient  understands that the risks include but are not limited to bleeding, infection, pneumothorax, perforation, tamponade, vascular damage, renal failure, MI, stroke, death, inappropriate shocks, and lead dislodgement and wishes to proceed.  We will therefore schedule device implantation at the next available time.  No driving x 6 months reinforced today.  Given prior L chest hematoma with surgery, we will plan venogram prior to initiation of the procedure to confirm that his L subclavian system is intact.  2. CAD No ischemic symptoms Medical therapy is advised  3. Ischemic CM euvolemic  Followed in CHF clinic On good medical regimen  4. LV thrombus Continue anticoagulation for now.  CHF clinic plans repeat echo in 2 months or so and hopefully stopping anticoagulation at that time.  He should be safe for DFT testing as he has been anticoagulated for at least 4 weeks.  Hold coumadin for 2 days prior to ICD implantation.  5. Tobacco use Cessation advised  Thompson Grayer MD, [facility] 10/13/2016 9:49 AM

## 2016-10-29 NOTE — Progress Notes (Signed)
Pt took off life vest on arrival to short stay and declines request from staff to put it back on.

## 2016-10-29 NOTE — Progress Notes (Signed)
Patient remains in atrial fibrillation and blood pressures low. Patient asymptomatic, denies chest pain, pressure, shortness of breath, dizziness or feeling lightheaded. Patient still drowsy but responses well to questions and arousable. [place] NP into see patient. Patient denies any symptoms except double vision.  1400 patient states he sees flowers, colors on the ceiling, no other symptoms 1403 Dr. Rayann Heman into assess patient, bedside echo done.  1405 Normal saline bolus 1000 mls  1410 Dr. Marlou Porch into see patient and assess bedside echo 1420 cardioversion with 200 joules resulting in short run of sinus with unifocal PVC's and pairs then sinus tachycardia.  1431 given romazicon 0.2 mg IV Patient alert and oriented at 1433 with good result from medication. 1435 given narcan 0.4 mg IV and patient remains alert and oriented  1440 Visual changes no longer present. Patient feels better and  1440 Normal saline bolus 1000 mls 1600 Pressure decreasing and patient more drowsy than earlier. Arousable and answers questions, denies any symptoms at this time.  2595 Systolic blood pressure remaining 70-80's, patient asymptomatic and sleeping when left alone. Will continue to closely monitor. 1800 No change in patient condition, Dr. Rayann Heman into see patient and no change in orders at this time. Patient stable without change.  1900 Report to [facility]. Patient remains stable, condition unchanged, arousable, oriented when aroused, easily returns to sleep when not disturbed and asymptomatic with low blood pressures.

## 2016-10-29 NOTE — Interval H&P Note (Signed)
History and Physical Interval Note:  10/29/2016 9:00 AM  [name] [name]  has presented today for surgery, with the diagnosis of cardiomyopathy-ischemic  The various methods of treatment have been discussed with the patient and family. After consideration of risks, benefits and other options for treatment, the patient has consented to  Procedure(s): ICD IMPLANT (N/A) as a surgical intervention .  The patient's history has been reviewed, patient examined, no change in status, stable for surgery.  I have reviewed the patient's chart and labs.  Questions were answered to the patient's satisfaction.    ICD Criteria  Current LVEF:35%. Within 12 months prior to implant: Yes   Heart failure history: Yes, Class III  Cardiomyopathy history: Yes, Ischemic Cardiomyopathy - Prior MI.  Atrial Fibrillation/Atrial Flutter: No.  Ventricular tachycardia history: Yes, Hemodynamic instability present. 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  Cardiac arrest history: Yes, Ventricular Fibrillation.  History of syndromes with risk of sudden death: No.  Previous ICD: No.  Current ICD indication: Secondary  PPM indication: Yes. Pacing type: Atrial. Greater than 40% RV pacing requirement anticipated. Indication: Sick Sinus Syndrome   Class I or II Bradycardia indication present: Yes  Beta Blocker therapy for 3 or more months: Yes, prescribed.   Ace Inhibitor/ARB therapy for 3 or more months: Yes, prescribed.      Thompson Grayer

## 2016-10-29 NOTE — Discharge Summary (Signed)
ELECTROPHYSIOLOGY PROCEDURE DISCHARGE SUMMARY    Patient ID: [name] [name],  MRN: [id number], DOB/AGE: [birth year] 66 y.o.  Admit date: 10/29/2016 Discharge date: 10/30/2016  Primary Care Physician: [place] Cardiologist: [place] Electrophysiologist: Naasir Carreira  Primary Discharge Diagnosis:  VF arrest and ICM s/p ICD implant this admission  Secondary Discharge Diagnosis:  1.  CAD 2.  HTN 3.  Chronic systolic heart failure 5.  Tobacco and ETOH abuse   Allergies  Allergen Reactions  . Spironolactone Other (See Comments)    Breast pain--swelling/inflammation     Procedures This Admission:  1.  Implantation of a STJ single chamber ICD on 10/29/16 by Dr Rayann Heman. See op note for full details.    There were no immediate post procedure complications. 2.  CXR on 10/30/16 demonstrated no pneumothorax status post device implantation.   Brief HPI: [name] [name] is a 66 y.o. male was referred to electrophysiology in the outpatient setting for consideration of ICD implantation.  Past medical history includes VF arrest, ICM, CHF.  The patient has persistent LV dysfunction despite guideline directed therapy.  Risks, benefits, and alternatives to ICD implantation were reviewed with the patient who wished to proceed.   Hospital Course:  The patient was admitted and underwent implantation of a STJ single chamber ICD with details as outlined above. He developed AF with RVR and hypotension post op.  He required fluid resuscitation and emergent cardioversion.  He was monitored on telemetry overnight which demonstrated sinus rhythm.  Left chest was without hematoma or ecchymosis.  The device was interrogated and found to be functioning normally.  CXR was obtained and demonstrated no pneumothorax status post device implantation.  Wound care, arm mobility, and restrictions were reviewed with the patient.  The patient was examined and considered stable  for discharge to home.   The patient's discharge medications include an ACE-I (Entresto) and beta blocker (Coreg).   Physical Exam: Vitals:   10/30/16 0105 10/30/16 0310 10/30/16 0355 10/30/16 0605  BP: 121/74 (!) 147/77 140/80 126/76  Pulse: 88 70 81 81  Resp: 20 14 19  (!) 23  Temp:   97.7 F (36.5 C) 97.7 F (36.5 C)  TempSrc:   Oral Oral  SpO2: 99% 98% 99% 99%  Weight:      Height:        GEN- The patient is well appearing, alert and oriented x 3 today.   HEENT: normocephalic, atraumatic; sclera clear, conjunctiva pink; hearing intact; oropharynx clear; neck supple  Lungs- Clear to ausculation bilaterally, normal work of breathing.  No wheezes, rales, rhonchi Heart- Regular rate and rhythm  GI- soft, non-tender, non-distended, bowel sounds present  Extremities- no clubbing, cyanosis, or edema; DP/PT/radial pulses 2+ bilaterally MS- no significant deformity or atrophy Skin- warm and dry, no rash or lesion, left chest without hematoma/ecchymosis Psych- euthymic mood, full affect Neuro- strength and sensation are intact   Labs:   Lab Results  Component Value Date   WBC 6.8 10/17/2016   HGB 13.1 10/17/2016   HCT 38.6 10/17/2016   MCV 96 10/17/2016   PLT 296 10/17/2016     Recent Labs Lab 10/30/16 0247  NA 136  K 3.8  CL 108  CO2 23  BUN 6  CREATININE 0.78  CALCIUM 8.0*  GLUCOSE 122*    Discharge Medications:  Allergies as of 10/30/2016      Reactions   Spironolactone Other (See Comments)   Breast pain--swelling/inflammation      Medication  List    TAKE these medications   acetaminophen 325 MG tablet Commonly known as:  TYLENOL Take 2 tablets (650 mg total) by mouth every 4 (four) hours as needed for mild pain or headache.   aspirin EC 81 MG tablet Take 1 tablet (81 mg total) by mouth daily.   carvedilol 6.25 MG tablet Commonly known as:  COREG Take 1 tablet (6.25 mg total) by mouth 2 (two) times daily with a meal.   eplerenone 25 MG  tablet Commonly known as:  INSPRA Take 1 tablet (25 mg total) by mouth daily.   folic acid 1 MG tablet Commonly known as:  FOLVITE Take 1 tablet (1 mg total) by mouth daily.   magnesium oxide 400 (241.3 Mg) MG tablet Commonly known as:  MAG-OX Take 1 tablet (400 mg total) by mouth daily.   rosuvastatin 10 MG tablet Commonly known as:  CRESTOR Take 1 tablet (10 mg total) by mouth daily.   sacubitril-valsartan 49-51 MG Commonly known as:  ENTRESTO Take 1 tablet by mouth 2 (two) times daily.   warfarin 2 MG tablet Commonly known as:  COUMADIN Take as directed by Coumadin Clinic       Disposition:  Discharge Instructions    Diet - low sodium heart healthy    Complete by:  As directed    Increase activity slowly    Complete by:  As directed      Follow-up Information    [facility] Follow up on 11/12/2016.   Specialty:  Cardiology Why:  at [facility] information: [address], [place]       Thompson Grayer, MD Follow up on 02/02/2017.   Specialty:  Cardiology Why:  at 9:30AM Contact information: [place] [address] [phone]        Jolaine Artist, MD Follow up on 01/08/2017.   Specialty:  Cardiology Why:  at 10:20AM Contact information: [place] [address] [phone]           Duration of Discharge Encounter: Greater than 30 minutes including physician time.  Signed, Chanetta Marshall, NP 10/30/2016 7:27 AM  I have seen, examined the patient, and reviewed the above assessment and plan.  Changes to above are made where necessary.  On exam, RRR.  CTAB.  Vitals are stable.  CXR reveals stable lead, no ptx.  Device interrogation is reviewed and normal.  DC to home with routine wound care and follow-up.  Co Sign: Thompson Grayer, MD 10/30/2016 7:33 AM

## 2016-10-30 ENCOUNTER — Ambulatory Visit (HOSPITAL_COMMUNITY): Payer: PPO

## 2016-10-30 DIAGNOSIS — I4901 Ventricular fibrillation: Secondary | ICD-10-CM

## 2016-10-30 DIAGNOSIS — I9581 Postprocedural hypotension: Secondary | ICD-10-CM | POA: Diagnosis not present

## 2016-10-30 DIAGNOSIS — I110 Hypertensive heart disease with heart failure: Secondary | ICD-10-CM | POA: Diagnosis not present

## 2016-10-30 DIAGNOSIS — I2510 Atherosclerotic heart disease of native coronary artery without angina pectoris: Secondary | ICD-10-CM | POA: Diagnosis not present

## 2016-10-30 DIAGNOSIS — I255 Ischemic cardiomyopathy: Secondary | ICD-10-CM | POA: Diagnosis not present

## 2016-10-30 DIAGNOSIS — Z7982 Long term (current) use of aspirin: Secondary | ICD-10-CM | POA: Diagnosis not present

## 2016-10-30 DIAGNOSIS — R079 Chest pain, unspecified: Secondary | ICD-10-CM | POA: Diagnosis not present

## 2016-10-30 DIAGNOSIS — Z006 Encounter for examination for normal comparison and control in clinical research program: Secondary | ICD-10-CM | POA: Diagnosis not present

## 2016-10-30 DIAGNOSIS — Z955 Presence of coronary angioplasty implant and graft: Secondary | ICD-10-CM | POA: Diagnosis not present

## 2016-10-30 DIAGNOSIS — I5022 Chronic systolic (congestive) heart failure: Secondary | ICD-10-CM | POA: Diagnosis not present

## 2016-10-30 DIAGNOSIS — Z7901 Long term (current) use of anticoagulants: Secondary | ICD-10-CM | POA: Diagnosis not present

## 2016-10-30 DIAGNOSIS — Z8674 Personal history of sudden cardiac arrest: Secondary | ICD-10-CM | POA: Diagnosis not present

## 2016-10-30 DIAGNOSIS — I252 Old myocardial infarction: Secondary | ICD-10-CM | POA: Diagnosis not present

## 2016-10-30 DIAGNOSIS — Z79899 Other long term (current) drug therapy: Secondary | ICD-10-CM | POA: Diagnosis not present

## 2016-10-30 LAB — BASIC METABOLIC PANEL
ANION GAP: 5 (ref 5–15)
BUN: 6 mg/dL (ref 6–20)
CHLORIDE: 108 mmol/L (ref 101–111)
CO2: 23 mmol/L (ref 22–32)
Calcium: 8 mg/dL — ABNORMAL LOW (ref 8.9–10.3)
Creatinine, Ser: 0.78 mg/dL (ref 0.61–1.24)
Glucose, Bld: 122 mg/dL — ABNORMAL HIGH (ref 65–99)
POTASSIUM: 3.8 mmol/L (ref 3.5–5.1)
SODIUM: 136 mmol/L (ref 135–145)

## 2016-10-30 LAB — PROTIME-INR
INR: 1.41
PROTHROMBIN TIME: 17.1 s — AB (ref 11.4–15.2)

## 2016-10-30 MED FILL — Cefazolin Sodium-Dextrose IV Solution 2 GM/100ML-4%: INTRAVENOUS | Qty: 100 | Status: AC

## 2016-10-30 MED FILL — Sodium Chloride Irrigation Soln 0.9%: Qty: 500 | Status: AC

## 2016-10-30 MED FILL — Midazolam HCl Inj 5 MG/5ML (Base Equivalent): INTRAMUSCULAR | Qty: 5 | Status: AC

## 2016-10-30 MED FILL — Gentamicin Sulfate Inj 40 MG/ML: INTRAMUSCULAR | Qty: 2 | Status: AC

## 2016-11-05 ENCOUNTER — Ambulatory Visit (INDEPENDENT_AMBULATORY_CARE_PROVIDER_SITE_OTHER): Payer: PPO | Admitting: *Deleted

## 2016-11-05 DIAGNOSIS — I513 Intracardiac thrombosis, not elsewhere classified: Secondary | ICD-10-CM | POA: Diagnosis not present

## 2016-11-05 DIAGNOSIS — Z5181 Encounter for therapeutic drug level monitoring: Secondary | ICD-10-CM | POA: Diagnosis not present

## 2016-11-05 DIAGNOSIS — I240 Acute coronary thrombosis not resulting in myocardial infarction: Secondary | ICD-10-CM

## 2016-11-05 LAB — POCT INR: INR: 1.6

## 2016-11-12 ENCOUNTER — Ambulatory Visit (INDEPENDENT_AMBULATORY_CARE_PROVIDER_SITE_OTHER): Payer: PPO | Admitting: *Deleted

## 2016-11-12 DIAGNOSIS — I513 Intracardiac thrombosis, not elsewhere classified: Secondary | ICD-10-CM

## 2016-11-12 DIAGNOSIS — I469 Cardiac arrest, cause unspecified: Secondary | ICD-10-CM | POA: Diagnosis not present

## 2016-11-12 DIAGNOSIS — Z5181 Encounter for therapeutic drug level monitoring: Secondary | ICD-10-CM

## 2016-11-12 DIAGNOSIS — I240 Acute coronary thrombosis not resulting in myocardial infarction: Secondary | ICD-10-CM

## 2016-11-12 LAB — CUP PACEART INCLINIC DEVICE CHECK
Battery Remaining Longevity: 98 mo
Brady Statistic RV Percent Paced: 0 %
Date Time Interrogation Session: 20181031090943
HighPow Impedance: 60.75 Ohm
Implantable Lead Location: 753860
Lead Channel Impedance Value: 487.5 Ohm
Lead Channel Pacing Threshold Pulse Width: 0.5 ms
Lead Channel Setting Pacing Amplitude: 3.5 V
Lead Channel Setting Pacing Pulse Width: 0.5 ms
Lead Channel Setting Sensing Sensitivity: 0.5 mV
MDC IDC LEAD IMPLANT DT: 20181017
MDC IDC MSMT LEADCHNL RV PACING THRESHOLD AMPLITUDE: 0.5 V
MDC IDC MSMT LEADCHNL RV PACING THRESHOLD AMPLITUDE: 0.5 V
MDC IDC MSMT LEADCHNL RV PACING THRESHOLD PULSEWIDTH: 0.5 ms
MDC IDC MSMT LEADCHNL RV SENSING INTR AMPL: 12 mV
MDC IDC PG IMPLANT DT: 20181017
MDC IDC PG SERIAL: 9781512

## 2016-11-12 LAB — POCT INR: INR: 1.6

## 2016-11-12 NOTE — Progress Notes (Signed)
Wound check appointment. Steri-strips removed. Wound without redness or edema. Incision edges approximated, wound well healed. Normal device function. Threshold, sensing, and impedances consistent with implant measurements. Device programmed at 3.5V for extra safety margin until 3 month visit. Histogram distribution appropriate for patient and level of activity. No ventricular arrhythmias noted. Patient educated about wound care, arm mobility, lifting restrictions, shock plan. ROV 02/02/2017 w/ JA

## 2016-11-21 ENCOUNTER — Ambulatory Visit (INDEPENDENT_AMBULATORY_CARE_PROVIDER_SITE_OTHER): Payer: PPO | Admitting: *Deleted

## 2016-11-21 DIAGNOSIS — I240 Acute coronary thrombosis not resulting in myocardial infarction: Secondary | ICD-10-CM

## 2016-11-21 DIAGNOSIS — Z5181 Encounter for therapeutic drug level monitoring: Secondary | ICD-10-CM | POA: Diagnosis not present

## 2016-11-21 DIAGNOSIS — I513 Intracardiac thrombosis, not elsewhere classified: Secondary | ICD-10-CM

## 2016-11-21 LAB — POCT INR: INR: 2

## 2016-12-01 ENCOUNTER — Ambulatory Visit (INDEPENDENT_AMBULATORY_CARE_PROVIDER_SITE_OTHER): Payer: PPO | Admitting: *Deleted

## 2016-12-01 DIAGNOSIS — I513 Intracardiac thrombosis, not elsewhere classified: Secondary | ICD-10-CM | POA: Diagnosis not present

## 2016-12-01 DIAGNOSIS — Z5181 Encounter for therapeutic drug level monitoring: Secondary | ICD-10-CM | POA: Diagnosis not present

## 2016-12-01 DIAGNOSIS — I240 Acute coronary thrombosis not resulting in myocardial infarction: Secondary | ICD-10-CM

## 2016-12-01 LAB — POCT INR: INR: 3.1

## 2016-12-01 NOTE — Patient Instructions (Signed)
Today take 1mg  then continue taking 2mg  daily except 3mg  on Fridays.   Recheck in 2 weeks.   Call with any questions or concerns [phone]

## 2016-12-09 DIAGNOSIS — I25119 Atherosclerotic heart disease of native coronary artery with unspecified angina pectoris: Secondary | ICD-10-CM | POA: Diagnosis not present

## 2016-12-09 DIAGNOSIS — I10 Essential (primary) hypertension: Secondary | ICD-10-CM | POA: Diagnosis not present

## 2016-12-09 DIAGNOSIS — D539 Nutritional anemia, unspecified: Secondary | ICD-10-CM | POA: Diagnosis not present

## 2016-12-12 DIAGNOSIS — E782 Mixed hyperlipidemia: Secondary | ICD-10-CM | POA: Diagnosis not present

## 2016-12-12 DIAGNOSIS — I25118 Atherosclerotic heart disease of native coronary artery with other forms of angina pectoris: Secondary | ICD-10-CM | POA: Diagnosis not present

## 2016-12-12 DIAGNOSIS — D519 Vitamin B12 deficiency anemia, unspecified: Secondary | ICD-10-CM | POA: Diagnosis not present

## 2016-12-12 DIAGNOSIS — Z23 Encounter for immunization: Secondary | ICD-10-CM | POA: Diagnosis not present

## 2016-12-15 ENCOUNTER — Ambulatory Visit (INDEPENDENT_AMBULATORY_CARE_PROVIDER_SITE_OTHER): Payer: PPO | Admitting: *Deleted

## 2016-12-15 DIAGNOSIS — I513 Intracardiac thrombosis, not elsewhere classified: Secondary | ICD-10-CM

## 2016-12-15 DIAGNOSIS — Z5181 Encounter for therapeutic drug level monitoring: Secondary | ICD-10-CM

## 2016-12-15 DIAGNOSIS — I240 Acute coronary thrombosis not resulting in myocardial infarction: Secondary | ICD-10-CM

## 2016-12-15 LAB — POCT INR: INR: 2.7

## 2016-12-15 NOTE — Patient Instructions (Signed)
Continue taking 2mg  daily except 3mg  on Fridays.  Recheck in 3 weeks.  Call with any questions or concerns [phone]

## 2016-12-27 ENCOUNTER — Other Ambulatory Visit: Payer: Self-pay | Admitting: Internal Medicine

## 2017-01-08 ENCOUNTER — Encounter (HOSPITAL_COMMUNITY): Payer: Self-pay | Admitting: Internal Medicine

## 2017-01-08 ENCOUNTER — Ambulatory Visit (INDEPENDENT_AMBULATORY_CARE_PROVIDER_SITE_OTHER): Payer: PPO | Admitting: Pharmacist

## 2017-01-08 ENCOUNTER — Other Ambulatory Visit: Payer: Self-pay

## 2017-01-08 ENCOUNTER — Ambulatory Visit (HOSPITAL_COMMUNITY)
Admission: RE | Admit: 2017-01-08 | Discharge: 2017-01-08 | Disposition: A | Discharge status: Home / Self Care | Payer: PPO | Source: Ambulatory Visit | Attending: Internal Medicine | Admitting: Internal Medicine

## 2017-01-08 ENCOUNTER — Ambulatory Visit (HOSPITAL_BASED_OUTPATIENT_CLINIC_OR_DEPARTMENT_OTHER)
Admission: RE | Admit: 2017-01-08 | Discharge: 2017-01-08 | Disposition: A | Discharge status: Home / Self Care | Payer: PPO | Source: Ambulatory Visit | Attending: Internal Medicine | Admitting: Internal Medicine

## 2017-01-08 VITALS — BP 140/74 | HR 68 | Wt 132.0 lb

## 2017-01-08 DIAGNOSIS — I2510 Atherosclerotic heart disease of native coronary artery without angina pectoris: Secondary | ICD-10-CM | POA: Insufficient documentation

## 2017-01-08 DIAGNOSIS — F17210 Nicotine dependence, cigarettes, uncomplicated: Secondary | ICD-10-CM | POA: Diagnosis not present

## 2017-01-08 DIAGNOSIS — Z5181 Encounter for therapeutic drug level monitoring: Secondary | ICD-10-CM | POA: Diagnosis not present

## 2017-01-08 DIAGNOSIS — I252 Old myocardial infarction: Secondary | ICD-10-CM | POA: Diagnosis not present

## 2017-01-08 DIAGNOSIS — I480 Paroxysmal atrial fibrillation: Secondary | ICD-10-CM

## 2017-01-08 DIAGNOSIS — I240 Acute coronary thrombosis not resulting in myocardial infarction: Secondary | ICD-10-CM

## 2017-01-08 DIAGNOSIS — Z8674 Personal history of sudden cardiac arrest: Secondary | ICD-10-CM | POA: Insufficient documentation

## 2017-01-08 DIAGNOSIS — E785 Hyperlipidemia, unspecified: Secondary | ICD-10-CM | POA: Diagnosis not present

## 2017-01-08 DIAGNOSIS — I493 Ventricular premature depolarization: Secondary | ICD-10-CM | POA: Insufficient documentation

## 2017-01-08 DIAGNOSIS — Z7901 Long term (current) use of anticoagulants: Secondary | ICD-10-CM | POA: Diagnosis not present

## 2017-01-08 DIAGNOSIS — F1010 Alcohol abuse, uncomplicated: Secondary | ICD-10-CM | POA: Insufficient documentation

## 2017-01-08 DIAGNOSIS — N400 Enlarged prostate without lower urinary tract symptoms: Secondary | ICD-10-CM | POA: Diagnosis not present

## 2017-01-08 DIAGNOSIS — I5022 Chronic systolic (congestive) heart failure: Secondary | ICD-10-CM | POA: Insufficient documentation

## 2017-01-08 DIAGNOSIS — Z7982 Long term (current) use of aspirin: Secondary | ICD-10-CM | POA: Insufficient documentation

## 2017-01-08 DIAGNOSIS — I513 Intracardiac thrombosis, not elsewhere classified: Secondary | ICD-10-CM

## 2017-01-08 DIAGNOSIS — Z79899 Other long term (current) drug therapy: Secondary | ICD-10-CM | POA: Diagnosis not present

## 2017-01-08 DIAGNOSIS — I110 Hypertensive heart disease with heart failure: Secondary | ICD-10-CM | POA: Insufficient documentation

## 2017-01-08 DIAGNOSIS — I255 Ischemic cardiomyopathy: Secondary | ICD-10-CM | POA: Insufficient documentation

## 2017-01-08 DIAGNOSIS — Z95810 Presence of automatic (implantable) cardiac defibrillator: Secondary | ICD-10-CM | POA: Diagnosis not present

## 2017-01-08 LAB — BASIC METABOLIC PANEL
ANION GAP: 9 (ref 5–15)
BUN: 10 mg/dL (ref 6–20)
CHLORIDE: 101 mmol/L (ref 101–111)
CO2: 24 mmol/L (ref 22–32)
Calcium: 9.4 mg/dL (ref 8.9–10.3)
Creatinine, Ser: 0.89 mg/dL (ref 0.61–1.24)
GFR calc non Af Amer: >60 mL/min (ref >60)
Glucose, Bld: 118 mg/dL — ABNORMAL HIGH (ref 65–99)
Potassium: 5.6 mmol/L — ABNORMAL HIGH (ref 3.5–5.1)
Sodium: 134 mmol/L — ABNORMAL LOW (ref 135–145)

## 2017-01-08 LAB — CBC
HEMATOCRIT: 44.8 % (ref 39.0–52.0)
HEMOGLOBIN: 15.5 g/dL (ref 13.0–17.0)
MCH: 34.7 pg — AB (ref 26.0–34.0)
MCHC: 34.6 g/dL (ref 30.0–36.0)
MCV: 100.2 fL — AB (ref 78.0–100.0)
Platelets: 248 10*3/uL (ref 150–400)
RBC: 4.47 MIL/uL (ref 4.22–5.81)
RDW: 12.9 % (ref 11.5–15.5)
WBC: 7.2 10*3/uL (ref 4.0–10.5)

## 2017-01-08 LAB — PROTIME-INR
INR: 1.75
PROTHROMBIN TIME: 20.3 s — AB (ref 11.4–15.2)

## 2017-01-08 LAB — POCT INR: INR: 1.9

## 2017-01-08 MED ORDER — PERFLUTREN LIPID MICROSPHERE
1.0000 mL | INTRAVENOUS | Status: AC | PRN
  Administered 2017-01-08: 2 mL via INTRAVENOUS
  Filled 2017-01-08: qty 10

## 2017-01-08 MED ORDER — APIXABAN 5 MG PO TABS: 5.0000 mg | ORAL_TABLET | Freq: Two times a day (BID) | ORAL | 6 refills | Status: DC

## 2017-01-08 NOTE — Patient Instructions (Signed)
Description   Start Eliquis 5mg  twice daily this evening. Stop warfarin.

## 2017-01-08 NOTE — Patient Instructions (Signed)
Labs today  Stop Aspirin  Stop Warfarin  Start Eliquis 5 mg Twice daily, the coumadin clinic will advise you when to start this medication  We will contact you in 6 months to schedule your next appointment.

## 2017-01-08 NOTE — Progress Notes (Signed)
Pt was started on Eliquis for Afib on today.    Reviewed patients medication list.  Pt is not currently on any combined P-gp and strong CYP3A4 inhibitors/inducers (ketoconazole, traconazole, ritonavir, carbamazepine, phenytoin, rifampin, St. John's wort).  Reviewed labs.  SCr 0.89, Weight 78kg, CrCl- 6ml/min.  Dose appropriate based on age, weight, and SCr.  Hgb and HCT Within Normal Limits  A full discussion of the nature of anticoagulants has been carried out.  A benefit/risk analysis has been presented to the patient, so that they understand the justification for choosing anticoagulation with Eliquis at this time.  The need for compliance is stressed.  Pt is aware to take the medication twice daily.  Side effects of potential bleeding are discussed, including unusual colored urine or stools, coughing up blood or coffee ground emesis, nose bleeds or serious fall or head trauma.  Discussed signs and symptoms of stroke. The patient should avoid any OTC items containing aspirin or ibuprofen.  Avoid alcohol consumption.   Call if any signs of abnormal bleeding.  Discussed financial obligations and resolved any difficulty in obtaining medication.

## 2017-01-08 NOTE — Addendum Note (Signed)
Encounter addended by: Scarlette Calico, RN on: 01/08/2017 11:02 AM  Actions taken: Diagnosis association updated, Sign clinical note, Medication long-term status modified, Order list changed

## 2017-01-08 NOTE — Progress Notes (Signed)
  Echocardiogram 2D Echocardiogram has been performed.  Elmer Ramp 01/08/2017, 12:43 PM

## 2017-01-08 NOTE — Progress Notes (Signed)
Advanced Heart Failure Clinic Note   Primary Cardiologist: Dr. Haroldine Laws  EP: Dr. Rayann Heman  HPI  [name] [name] is a 66 y.o. male with history of Systolic CHF, CAD, HTN, HLD, tobacco abuse, and BPH.   Had complicated admission 3/53/61 to 08/19/16 due to out of hospital cardiac arrest c/b cpr/defib and gi perforation thought 2/2 to rib fracture as well as large chest wall hematoma and t8 vertebral fracture thought all from CPR. Noted to have LV thrombus as well.  EP saw and recommended ICD as outpatient with long, complicated admission. Meds adjusted as tolerated. Pt underwent multiple procedures including exploratory laparotomy and repair of perforated stomach with an omental patch by General Surgery and evacuation of chest hematoma by TCTS. Cath as below with 3v disease though mostly non-obstructive, medical management pursued.  Lifevest placed prior to discharge.   He is here for follow up. Underwent STJ ICD implant 44/31/54 complicated by recurrent AF and severe hypotension. Required emergent DC-CV. Doing well now. Walking his dog without problem. No CP. No DOE. Weight stable. Smoking 1PPD + couple glasses of wine at night  On warfarin without bleeding.    Echo today 40-45% with antero-septal AK Personally reviewed   Echo 08/06/16 - LVEF 35-40%, grade 1 DD. Apparent medium-sized apical thrombus.   Left/Right heart cath 08/13/16  Ost RCA lesion, 80 %stenosed.  Ost LM lesion, 40 %stenosed.  Ost Ramus to Ramus lesion, 80 %stenosed.  Ost LAD to Prox LAD lesion, 70 %stenosed.  Ost 1st Diag to 1st Diag lesion, 75 %stenosed.  Mid LAD to Dist LAD lesion, 40 %stenosed.  Findings:  Ao = 141/70 (102) LV = 126/13 RA = 5 RV = 29/8 PA = 33/9 (22) PCW = 12 Fick cardiac output/index = 5.3/3.0 PVR = 1.9 WU Ao sat = 92%  PA sat = 55%,54%  Review of systems complete and found to be negative unless listed in HPI.    Past Medical History:  Diagnosis Date  . AICD (automatic  cardioverter/defibrillator) present   . Coronary artery disease    1980's-90's MI with perhaps angioplasty  . Hypertension   . Myocardial infarction ([place]) 1999  . Ventricular fibrillation ([place]) 07/2016   "w/cardiac arrest   Current Outpatient Medications  Medication Sig Dispense Refill  . acetaminophen (TYLENOL) 325 MG tablet Take 2 tablets (650 mg total) by mouth every 4 (four) hours as needed for mild pain or headache.    Marland Kitchen aspirin EC 81 MG tablet Take 1 tablet (81 mg total) by mouth daily. 30 tablet 0  . carvedilol (COREG) 6.25 MG tablet Take 1 tablet (6.25 mg total) by mouth 2 (two) times daily with a meal. 60 tablet 5  . eplerenone (INSPRA) 25 MG tablet Take 1 tablet (25 mg total) by mouth daily. 30 tablet 3  . rosuvastatin (CRESTOR) 10 MG tablet Take 1 tablet (10 mg total) by mouth daily. 30 tablet 5  . sacubitril-valsartan (ENTRESTO) 49-51 MG Take 1 tablet by mouth 2 (two) times daily. 180 tablet 3  . warfarin (COUMADIN) 2 MG tablet TAKE AS DIRECTED 35 tablet 3   No current facility-administered medications for this encounter.    Facility-Administered Medications Ordered in Other Encounters  Medication Dose Route Frequency Provider Last Rate Last Dose  . perflutren lipid microspheres (DEFINITY) IV suspension  1-10 mL Intravenous PRN Erlene Quan, PA-C   2 mL at 01/08/17 1005   Allergies  Allergen Reactions  . Spironolactone Other (See Comments)    Breast pain--swelling/inflammation  Social History   Socioeconomic History  . Marital status: Married    Spouse name: Not on file  . Number of children: Not on file  . Years of education: Not on file  . Highest education level: Not on file  Social Needs  . Financial resource strain: Not on file  . Food insecurity - worry: Not on file  . Food insecurity - inability: Not on file  . Transportation needs - medical: Not on file  . Transportation needs - non-medical: Not on file  Occupational History  . Not on file    Tobacco Use  . Smoking status: Current Every Day Smoker    Packs/day: 1.00    Years: 25.00    Pack years: 25.00    Types: Cigarettes  . Smokeless tobacco: Former Systems developer    Types: Chew  Substance and Sexual Activity  . Alcohol use: Yes    Alcohol/week: 8.4 oz    Types: 14 Glasses of wine per week    Comment: 10/29/2016 "couple glasses of red wine/day"  . Drug use: No  . Sexual activity: Not Currently  Other Topics Concern  . Not on file  Social History Narrative   Lives in [place] with spouse    Family History  Problem Relation Age of Onset  . Osteoporosis Mother   . Lung cancer Father   . Other Brother        died in [place]   Vitals:   01/18/2017 1025  BP: 140/74  Pulse: 68  SpO2: 100%  Weight: 132 lb (59.9 kg)   Wt Readings from Last 3 Encounters:  Jan 18, 2017 132 lb (59.9 kg)  10/29/16 125 lb (56.7 kg)  10/13/16 172 lb (78 kg)    PHYSICAL EXAM: General:  Well appearing. No resp difficulty HEENT: normal Neck: supple. no JVD. Carotids 2+ bilat; no bruits. No lymphadenopathy or thryomegaly appreciated. Cor: PMI nondisplaced. Regular rate & rhythm. No rubs, gallops or murmurs. ICD site ok. Lungs: clear decreased BS throughout Abdomen: soft, nontender, nondistended. No hepatosplenomegaly. No bruits or masses. Good bowel sounds. Extremities: no cyanosis, clubbing, rash, edema Neuro: alert & orientedx3, cranial nerves grossly intact. moves all 4 extremities w/o difficulty. Affect pleasant   ASSESSMENT & PLAN:  1. CAD with ICM s/p Cardiac Arrest VT/VF:  - Cath 08/13/16 with 3V disease (mostly non-obstructive)  medical therapy. If develops angina can consider PCI to Ramus.  - Stable. No s/s ischemia.  Continue statin. Can stop ASA with anticoagulation.   2. Chronic systolic HF.  - Echo 3/87/56 - LVEF 35-40%, grade 1 DD. Apparent medium-sized apical thrombus.  - Echo today EF 40-45% No LV thrombus - NYHA II. Volume status looks good.  - Continue entresto 49/51 mg BID.  Will not titrate given low BP recently . - Continue coreg 6.25 mg BID.  - Continue eplerenone 25 mg daily.  (did not tolerate spiro due to painful gynecomastia)  3. Perforated stomach s/p repair. - Suspect due to CPR. S/p repair. Doing well  4. Frequent PVCs - Much improved with b-blocker.  - Continue Coreg 6.25 mg BID.   5. LV Thrombus:  - Resolved on echo today. Personally reviewed  6. ETOH and tobacco abuse.  - Encouraged complete cessation  7. PAF - In NSR today. Switch warfarin to Eliquis 5 bid   Glori Bickers, MD Jan 18, 2017

## 2017-01-14 ENCOUNTER — Telehealth (HOSPITAL_COMMUNITY): Payer: Self-pay | Admitting: *Deleted

## 2017-01-14 DIAGNOSIS — I5022 Chronic systolic (congestive) heart failure: Secondary | ICD-10-CM

## 2017-01-14 MED ORDER — EPLERENONE 25 MG PO TABS: 12.5000 mg | ORAL_TABLET | Freq: Every day | ORAL | 3 refills | Status: DC

## 2017-01-14 NOTE — Telephone Encounter (Signed)
Result Notes for Basic metabolic panel   Notes recorded by Darron Doom, RN on 01/14/2017 at 9:24 AM EST Spoke with patient and he is agreeable with plan. Medication decreased, appointment scheduled, and bmet ordered. ------  Notes recorded by Darron Doom, RN on 01/09/2017 at 9:28 AM EST Called and left message for patient to call back. ------  Notes recorded by Scarlette Calico, RN on 01/08/2017 at 5:11 PM EST Per Dr Haroldine Laws decrease Inspra to 12.5 mg, repeat in 1 week

## 2017-01-16 ENCOUNTER — Telehealth (HOSPITAL_COMMUNITY): Payer: Self-pay | Admitting: Pharmacist

## 2017-01-16 NOTE — Telephone Encounter (Signed)
Enrolled in [place] so that Mr. Derrig will have $1000 to use toward his Delene Loll copay costs through 01/15/18.   Member ID: [id number] Group ID: [id number] RxBin ID: [id number] PCN: [id number] Eligibility Start Date: 10/18/2016 Eligibility End Date: 01/15/2018 Assistance Amount: $1,000.00   Doroteo Bradford K. Velva Harman, PharmD, BCPS, CPP Clinical Pharmacist Pager: [phone] Phone: [phone] 01/16/2017 3:57 PM

## 2017-01-20 ENCOUNTER — Other Ambulatory Visit (HOSPITAL_COMMUNITY): Payer: Self-pay | Admitting: Student

## 2017-01-21 ENCOUNTER — Ambulatory Visit (HOSPITAL_COMMUNITY)
Admission: RE | Admit: 2017-01-21 | Discharge: 2017-01-21 | Disposition: A | Discharge status: Home / Self Care | Payer: PPO | Source: Ambulatory Visit | Attending: Internal Medicine | Admitting: Internal Medicine

## 2017-01-21 DIAGNOSIS — I5022 Chronic systolic (congestive) heart failure: Secondary | ICD-10-CM | POA: Diagnosis not present

## 2017-01-21 LAB — BASIC METABOLIC PANEL
ANION GAP: 7 (ref 5–15)
BUN: 6 mg/dL (ref 6–20)
CALCIUM: 9 mg/dL (ref 8.9–10.3)
CO2: 26 mmol/L (ref 22–32)
Chloride: 101 mmol/L (ref 101–111)
Creatinine, Ser: 1.01 mg/dL (ref 0.61–1.24)
GFR calc Af Amer: >60 mL/min (ref >60)
GLUCOSE: 94 mg/dL (ref 65–99)
POTASSIUM: 4.8 mmol/L (ref 3.5–5.1)
SODIUM: 134 mmol/L — AB (ref 135–145)

## 2017-02-02 ENCOUNTER — Encounter: Payer: Self-pay | Admitting: Internal Medicine

## 2017-02-02 ENCOUNTER — Encounter (INDEPENDENT_AMBULATORY_CARE_PROVIDER_SITE_OTHER): Payer: Self-pay

## 2017-02-02 ENCOUNTER — Ambulatory Visit: Payer: PPO | Admitting: Internal Medicine

## 2017-02-02 VITALS — BP 142/80 | HR 75 | Ht 68.0 in | Wt 135.0 lb

## 2017-02-02 DIAGNOSIS — I255 Ischemic cardiomyopathy: Secondary | ICD-10-CM

## 2017-02-02 DIAGNOSIS — I513 Intracardiac thrombosis, not elsewhere classified: Secondary | ICD-10-CM | POA: Diagnosis not present

## 2017-02-02 DIAGNOSIS — I480 Paroxysmal atrial fibrillation: Secondary | ICD-10-CM

## 2017-02-02 DIAGNOSIS — I5022 Chronic systolic (congestive) heart failure: Secondary | ICD-10-CM

## 2017-02-02 DIAGNOSIS — I240 Acute coronary thrombosis not resulting in myocardial infarction: Secondary | ICD-10-CM

## 2017-02-02 NOTE — Progress Notes (Signed)
PCP: [facility] Primary Cardiologist:  Dr Haroldine Laws Primary EP: Dr Darryll Capers is a 67 y.o. male who presents today for routine electrophysiology followup.  Since his recent ICD implant, the patient reports doing very well.  Today, he denies symptoms of palpitations, chest pain, shortness of breath,  lower extremity edema, dizziness, presyncope, syncope, or ICD shocks.  The patient is otherwise without complaint today.   Past Medical History:  Diagnosis Date  . AICD (automatic cardioverter/defibrillator) present   . CAD (coronary artery disease)   . Coronary artery disease    1980's-90's MI with perhaps angioplasty  . Hypertension   . Ischemic cardiomyopathy   . LV (left ventricular) mural thrombus following MI ([place])   . Myocardial infarction ([place]) 1999  . Ventricular fibrillation ([place]) 07/2016       Past Surgical History:  Procedure Laterality Date  . CORONARY ANGIOPLASTY  1999   "Dr. Vidal Schwalbe"  . HEMATOMA EVACUATION Left 08/15/2016   Procedure: INSERTION OF DRAIN ANTERIOR LEFT CHEST WALL HEMATOMA;  Surgeon: Ivin Poot, MD;  Location: [facility];  Service: Thoracic;  Laterality: Left;  . ICD IMPLANT N/A 10/29/2016   [employer] [place] VR model [phone] (serial  Number [id number]) ICD for secondary prevention of sudden death after prior VF arrest by Dr Rayann Heman  . LAPAROTOMY N/A 08/04/2016   Procedure: EXPLORATORY LAPAROTOMY FREE AIR BIOPSY OF PERFORATED STOMACH ULCER REPAIR OF PERFORATED STOMACH ULCER WITH A OMENTAL PATCH;  Surgeon: Georganna Skeans, MD;  Location: [facility];  Service: General;  Laterality: N/A;  . RIGHT/LEFT HEART CATH AND CORONARY ANGIOGRAPHY N/A 08/13/2016   Procedure: Right/Left Heart Cath and Coronary Angiography;  Surgeon: Jolaine Artist, MD;  Location: [facility];  Service: Cardiovascular;  Laterality: N/A;    ROS- all systems are reviewed and negative except as per HPI above  Current Outpatient  Medications  Medication Sig Dispense Refill  . acetaminophen (TYLENOL) 325 MG tablet Take 2 tablets (650 mg total) by mouth every 4 (four) hours as needed for mild pain or headache.    Marland Kitchen apixaban (ELIQUIS) 5 MG TABS tablet Take 1 tablet (5 mg total) by mouth 2 (two) times daily. 60 tablet 6  . carvedilol (COREG) 6.25 MG tablet Take 1 tablet (6.25 mg total) by mouth 2 (two) times daily with a meal. 60 tablet 5  . eplerenone (INSPRA) 25 MG tablet Take 0.5 tablets (12.5 mg total) by mouth daily. 45 tablet 3  . rosuvastatin (CRESTOR) 10 MG tablet Take 1 tablet (10 mg total) by mouth daily. 30 tablet 5  . sacubitril-valsartan (ENTRESTO) 49-51 MG Take 1 tablet by mouth 2 (two) times daily. 180 tablet 3   No current facility-administered medications for this visit.     Physical Exam: Vitals:   02/02/17 0935  BP: (!) 142/80  Pulse: 75  Weight: 135 lb (61.2 kg)  Height: 5\' 8"  (1.727 m)    GEN- The patient is well appearing, alert and oriented x 3 today.   Head- normocephalic, atraumatic Eyes-  Sclera clear, conjunctiva pink Ears- hearing intact Oropharynx- clear Lungs- Clear to ausculation bilaterally, normal work of breathing Chest- ICD pocket is well healed Heart- Regular rate and rhythm, no murmurs, rubs or gallops, PMI not laterally displaced GI- soft, NT, ND, + BS Extremities- no clubbing, cyanosis, or edema  ICD interrogation- reviewed in detail today,  See PACEART report  ekg tracing ordered today is personally reviewed and shows sinus rhythm 74  bpm, anterior infarct ion, inferior infarct, QRS 82 msec  Assessment and Plan:  1.  VF arrest S/p ICD implant for secondary prevention of sudden death Normal ICD function See Pace Art report No changes today  2. Chronic systolic dysfunction/ ischemic CM/ CAD euvolemic today No ischemic symptoms Stable on an appropriate medical regimen Normal ICD function See Pace Art report No changes today    3. LV thrombus Resolved by echo  12/18  4. Paroxysmal atrial fibrillation Well controlled  chads2vasc score is at least 4 On eliquis  5. Tobacco use Cessation advised  [place] Return to see EP NP in 6 months Refer to [facility] device clinic  Thompson Grayer MD, [facility] 02/02/2017 9:54 AM

## 2017-02-02 NOTE — Patient Instructions (Signed)
Medication Instructions:  Your physician recommends that you continue on your current medications as directed. Please refer to the Current Medication list given to you today.   Labwork: None ordered   Testing/Procedures: None ordered   Follow-Up: Remote monitoring is used to monitor your ICD from home. This monitoring reduces the number of office visits required to check your device to one time per year. It allows [place] to keep an eye on the functioning of your device to ensure it is working properly. You are scheduled for a device check from home on 05/04/17. You may send your transmission at any time that day. If you have a wireless device, the transmission will be sent automatically. After your physician reviews your transmission, you will receive a postcard with your next transmission date.  Your physician wants you to follow-up in: 6 months with Chanetta Marshall, NP You will receive a reminder letter in the mail two months in advance. If you don't receive a letter, please call our office to schedule the follow-up appointment.   [name], RN will call you to enroll in the [facility] clinic    Any Other Special Instructions Will Be Listed Below (If Applicable).     If you need a refill on your cardiac medications before your next appointment, please call your pharmacy.

## 2017-02-06 LAB — CUP PACEART INCLINIC DEVICE CHECK
Brady Statistic RV Percent Paced: 0 %
HIGH POWER IMPEDANCE MEASURED VALUE: 68.625
Lead Channel Impedance Value: 550 Ohm
Lead Channel Pacing Threshold Amplitude: 0.5 V
Lead Channel Pacing Threshold Pulse Width: 0.5 ms
Lead Channel Sensing Intrinsic Amplitude: 12 mV
Lead Channel Setting Pacing Amplitude: 2.5 V
Lead Channel Setting Pacing Pulse Width: 0.5 ms
Lead Channel Setting Sensing Sensitivity: 0.5 mV
MDC IDC LEAD IMPLANT DT: 20181017
MDC IDC LEAD LOCATION: 753860
MDC IDC MSMT BATTERY REMAINING LONGEVITY: 98 mo
MDC IDC MSMT LEADCHNL RV PACING THRESHOLD AMPLITUDE: 0.5 V
MDC IDC MSMT LEADCHNL RV PACING THRESHOLD PULSEWIDTH: 0.5 ms
MDC IDC PG IMPLANT DT: 20181017
MDC IDC PG SERIAL: 9781512
MDC IDC SESS DTM: 20190121153642

## 2017-03-05 ENCOUNTER — Ambulatory Visit (INDEPENDENT_AMBULATORY_CARE_PROVIDER_SITE_OTHER): Payer: PPO

## 2017-03-05 DIAGNOSIS — Z95810 Presence of automatic (implantable) cardiac defibrillator: Secondary | ICD-10-CM | POA: Diagnosis not present

## 2017-03-05 DIAGNOSIS — I5022 Chronic systolic (congestive) heart failure: Secondary | ICD-10-CM | POA: Diagnosis not present

## 2017-03-06 NOTE — Progress Notes (Signed)
EPIC Encounter for ICM Monitoring  Patient Name: [name] [name] is a 67 y.o. male Date: 03/06/2017 Primary Care Physican: [place], [place] At Primary Cardiologist: [place] Electrophysiologist: Allred Dry Weight: 134.6 lbs  1st ICM remote.  Referred to ICM clinic by Dr Rayann Heman. Patient agreed to monthly follow.  Heart Failure questions reviewed, pt asymptomatic.   Thoracic impedance normal but was abnormal suggesting fluid accumulation from 02/13/2017 to 02/26/2017.  Recommendations: No changes.  Advised to limit salt intake.  Encouraged to call for fluid symptoms.  Follow-up plan: ICM clinic phone appointment on 04/06/2017.    Copy of ICM check sent to Dr. Rayann Heman.   3 month ICM trend: 03/05/2017    1 Year ICM trend:       Rosalene Billings, RN 03/06/2017 2:02 PM

## 2017-03-09 ENCOUNTER — Other Ambulatory Visit (HOSPITAL_COMMUNITY): Payer: Self-pay | Admitting: Internal Medicine

## 2017-03-11 ENCOUNTER — Other Ambulatory Visit (HOSPITAL_COMMUNITY): Payer: Self-pay | Admitting: Internal Medicine

## 2017-03-30 ENCOUNTER — Other Ambulatory Visit (HOSPITAL_COMMUNITY): Payer: Self-pay | Admitting: Internal Medicine

## 2017-04-06 ENCOUNTER — Telehealth: Payer: Self-pay

## 2017-04-06 ENCOUNTER — Ambulatory Visit (INDEPENDENT_AMBULATORY_CARE_PROVIDER_SITE_OTHER): Payer: PPO

## 2017-04-06 DIAGNOSIS — I5022 Chronic systolic (congestive) heart failure: Secondary | ICD-10-CM | POA: Diagnosis not present

## 2017-04-06 DIAGNOSIS — Z95810 Presence of automatic (implantable) cardiac defibrillator: Secondary | ICD-10-CM | POA: Diagnosis not present

## 2017-04-06 NOTE — Progress Notes (Signed)
EPIC Encounter for ICM Monitoring  Patient Name: [name] [name] is a 67 y.o. male Date: 04/06/2017 Primary Care Physican: [place] [place] At Primary Cardiologist: [place] Electrophysiologist: Allred Dry Weight:  Previous weight  134.6 lbs        Attempted call to patient and unable to reach.  Left message with wife to return call.  Transmission reviewed.    Thoracic impedance normal.  Impedance decreased from 03/13/2017 to 03/17/2017 and 03/24/2017 through 03/26/2017.  Recommendations: NONE - Unable to reach.  Follow-up plan: ICM clinic phone appointment on 05/07/2017.   Copy of ICM check sent to Dr. Rayann Heman.   3 month ICM trend: 04/06/2017    1 Year ICM trend:       Rosalene Billings, RN 04/06/2017 8:34 AM

## 2017-04-06 NOTE — Telephone Encounter (Signed)
Remote ICM transmission received.  Attempted call to patient and left message with wife for patient to return call.

## 2017-04-06 NOTE — Progress Notes (Signed)
Received call back.  Patient reported he is feeling well.  He has been outside mowing yard and loading some wood to take to his vacation home. He denied any fluid symptoms during decreased impedance.  Weight is stable at 134.4 lbs.  No changes today and encouraged to call for any fluid symptoms.

## 2017-05-02 ENCOUNTER — Other Ambulatory Visit (HOSPITAL_COMMUNITY): Payer: Self-pay | Admitting: Internal Medicine

## 2017-05-05 DIAGNOSIS — H52223 Regular astigmatism, bilateral: Secondary | ICD-10-CM | POA: Diagnosis not present

## 2017-05-05 DIAGNOSIS — H5203 Hypermetropia, bilateral: Secondary | ICD-10-CM | POA: Diagnosis not present

## 2017-05-05 DIAGNOSIS — H524 Presbyopia: Secondary | ICD-10-CM | POA: Diagnosis not present

## 2017-05-07 ENCOUNTER — Ambulatory Visit (INDEPENDENT_AMBULATORY_CARE_PROVIDER_SITE_OTHER): Payer: PPO | Admitting: *Deleted

## 2017-05-07 DIAGNOSIS — Z95810 Presence of automatic (implantable) cardiac defibrillator: Secondary | ICD-10-CM

## 2017-05-07 DIAGNOSIS — I255 Ischemic cardiomyopathy: Secondary | ICD-10-CM

## 2017-05-07 DIAGNOSIS — I5022 Chronic systolic (congestive) heart failure: Secondary | ICD-10-CM

## 2017-05-07 NOTE — Progress Notes (Signed)
EPIC Encounter for ICM Monitoring  Patient Name: [name] [name] is a 67 y.o. male Date: 05/07/2017 Primary Care Physican: [place], [place] At Primary Cardiologist:[place] Electrophysiologist:Allred Dry Weight:133.4lbs       Heart Failure questions reviewed, pt has complaint of cramping in feet and hands.  He will discuss with Dr Haroldine Laws in June.  He is physically active by working in yard and Probation officer impedance normal but was abnormal suggesting fluid accumulation from 04/11/2017 - 05/01/2017.  Recommendations: No changes.  Reinforced fluid restriction to < 2 L daily and sodium restriction to less than 2000 mg daily.  Encouraged to call for fluid symptoms.  Follow-up plan: ICM clinic phone appointment on 06/09/2017.    Copy of ICM check sent to Dr. Rayann Heman.   3 month ICM trend: 05/07/2017    1 Year ICM trend:       Rosalene Billings, RN 05/07/2017 11:09 AM

## 2017-05-08 ENCOUNTER — Encounter: Payer: Self-pay | Admitting: Cardiology

## 2017-05-08 NOTE — Progress Notes (Signed)
Remote ICD transmission.   

## 2017-05-29 LAB — CUP PACEART REMOTE DEVICE CHECK
Battery Remaining Longevity: 96 mo
Battery Voltage: 3.19 V
Date Time Interrogation Session: 20190425060016
HIGH POWER IMPEDANCE MEASURED VALUE: 69 Ohm
HighPow Impedance: 69 Ohm
Implantable Lead Location: 753860
Lead Channel Sensing Intrinsic Amplitude: 12 mV
Lead Channel Setting Pacing Amplitude: 2.5 V
MDC IDC LEAD IMPLANT DT: 20181017
MDC IDC MSMT BATTERY REMAINING PERCENTAGE: 93 %
MDC IDC MSMT LEADCHNL RV IMPEDANCE VALUE: 490 Ohm
MDC IDC MSMT LEADCHNL RV PACING THRESHOLD AMPLITUDE: 0.5 V
MDC IDC MSMT LEADCHNL RV PACING THRESHOLD PULSEWIDTH: 0.5 ms
MDC IDC PG IMPLANT DT: 20181017
MDC IDC PG SERIAL: 9781512
MDC IDC SET LEADCHNL RV PACING PULSEWIDTH: 0.5 ms
MDC IDC SET LEADCHNL RV SENSING SENSITIVITY: 0.5 mV
MDC IDC STAT BRADY RV PERCENT PACED: 1 %

## 2017-06-09 ENCOUNTER — Ambulatory Visit (INDEPENDENT_AMBULATORY_CARE_PROVIDER_SITE_OTHER): Payer: PPO

## 2017-06-09 DIAGNOSIS — I5022 Chronic systolic (congestive) heart failure: Secondary | ICD-10-CM

## 2017-06-09 DIAGNOSIS — Z95810 Presence of automatic (implantable) cardiac defibrillator: Secondary | ICD-10-CM

## 2017-06-09 NOTE — Progress Notes (Signed)
EPIC Encounter for ICM Monitoring  Patient Name: [name] [name] is a 67 y.o. male Date: 06/09/2017 Primary Care Physican: [place], [place] At Primary Cardiologist:[place] Electrophysiologist:Allred Dry Weight:131.4lbs             Heart Failure questions reviewed, pt asymptomatic.   Thoracic impedance normal but was abnormal suggesting fluid accumulation from 05/24/2017 - 05/29/2017 and 06/06/2017 to 06/08/2017.  Recommendations: No changes.  Encouraged to call for fluid symptoms.  Follow-up plan: ICM clinic phone appointment on 07/10/2017.   Copy of ICM check sent to Dr. Rayann Heman.   3 month ICM trend: 06/09/2017    1 Year ICM trend:       Rosalene Billings, RN 06/09/2017 11:31 AM

## 2017-07-10 ENCOUNTER — Ambulatory Visit (INDEPENDENT_AMBULATORY_CARE_PROVIDER_SITE_OTHER): Payer: PPO

## 2017-07-10 ENCOUNTER — Telehealth: Payer: Self-pay

## 2017-07-10 DIAGNOSIS — I5022 Chronic systolic (congestive) heart failure: Secondary | ICD-10-CM | POA: Diagnosis not present

## 2017-07-10 DIAGNOSIS — Z95810 Presence of automatic (implantable) cardiac defibrillator: Secondary | ICD-10-CM

## 2017-07-10 NOTE — Progress Notes (Signed)
EPIC Encounter for ICM Monitoring  Patient Name: [name] [name] is a 67 y.o. male Date: 07/10/2017 Primary Care Physican: [place], [place] At Primary Cardiologist:[place] Electrophysiologist:Allred Dry Weight:Previous weight 131.4lbs       Attempted call to patient and unable to reach.  Left message to return call.  Transmission reviewed.    Thoracic impedance normal.  Recommendations:  NONE - Unable to reach.  Follow-up plan: ICM clinic phone appointment on 08/13/2017.  Recall appt 07/07/2017 with Dr. Haroldine Laws and 08/05/2017 with Chanetta Marshall, NP.  Copy of ICM check sent to Dr. Rayann Heman.   3 month ICM trend: 07/10/2017    1 Year ICM trend:       Rosalene Billings, RN 07/10/2017 8:45 AM

## 2017-07-10 NOTE — Telephone Encounter (Signed)
Remote ICM transmission received.  Attempted call to patient and left message to return call. 

## 2017-07-10 NOTE — Progress Notes (Signed)
Patient returned call and stated he is feeling well.  Denied any fluid symptoms.  Weight stable at 130 lbs.  Transmission reviewed.  Next remote transmission 08/13/2017 and encouraged to call for any fluid symptoms.

## 2017-08-03 ENCOUNTER — Other Ambulatory Visit (HOSPITAL_COMMUNITY): Payer: Self-pay | Admitting: Internal Medicine

## 2017-08-03 DIAGNOSIS — H02831 Dermatochalasis of right upper eyelid: Secondary | ICD-10-CM | POA: Diagnosis not present

## 2017-08-03 DIAGNOSIS — H2513 Age-related nuclear cataract, bilateral: Secondary | ICD-10-CM | POA: Diagnosis not present

## 2017-08-03 DIAGNOSIS — H02832 Dermatochalasis of right lower eyelid: Secondary | ICD-10-CM | POA: Diagnosis not present

## 2017-08-03 DIAGNOSIS — D23122 Other benign neoplasm of skin of left lower eyelid, including canthus: Secondary | ICD-10-CM | POA: Diagnosis not present

## 2017-08-13 ENCOUNTER — Ambulatory Visit (INDEPENDENT_AMBULATORY_CARE_PROVIDER_SITE_OTHER): Payer: PPO

## 2017-08-13 ENCOUNTER — Ambulatory Visit (INDEPENDENT_AMBULATORY_CARE_PROVIDER_SITE_OTHER): Payer: PPO | Admitting: *Deleted

## 2017-08-13 ENCOUNTER — Encounter: Payer: Self-pay | Admitting: Cardiology

## 2017-08-13 DIAGNOSIS — Z95810 Presence of automatic (implantable) cardiac defibrillator: Secondary | ICD-10-CM | POA: Diagnosis not present

## 2017-08-13 DIAGNOSIS — I5022 Chronic systolic (congestive) heart failure: Secondary | ICD-10-CM

## 2017-08-13 DIAGNOSIS — I255 Ischemic cardiomyopathy: Secondary | ICD-10-CM

## 2017-08-13 NOTE — Progress Notes (Signed)
EPIC Encounter for ICM Monitoring  Patient Name: [name] [name] is a 67 y.o. male Date: 08/13/2017 Primary Care Physican: [place], [place] At Primary Cardiologist:[place] Electrophysiologist:Allred Dry Weight:Previous weight 131.4lbs       Heart Failure questions reviewed, pt asymptomatic.  He does not think he drinks close to 64 oz fluid a day.   Thoracic impedance normal but above baseline suggesting dryness.  Recommendations: Advised to drink up to 64 oz a day and to increase fluids for next 2 days but not more than 64 oz.  Encouraged to call for fluid symptoms.  Follow-up plan: ICM clinic phone appointment on 09/15/2017.  Advised him to call office to make an appt with Chanetta Marshall, NP since he was due in July for 6 month follow up.  Copy of ICM check sent to Dr. Rayann Heman.   3 month ICM trend: 08/13/2017    1 Year ICM trend:      Rosalene Billings, RN 08/13/2017 4:16 PM

## 2017-08-13 NOTE — Progress Notes (Signed)
Remote ICD transmission.   

## 2017-08-17 DIAGNOSIS — H2511 Age-related nuclear cataract, right eye: Secondary | ICD-10-CM | POA: Diagnosis not present

## 2017-08-17 DIAGNOSIS — H2513 Age-related nuclear cataract, bilateral: Secondary | ICD-10-CM | POA: Diagnosis not present

## 2017-08-25 DIAGNOSIS — H2181 Floppy iris syndrome: Secondary | ICD-10-CM | POA: Diagnosis not present

## 2017-08-25 DIAGNOSIS — H25811 Combined forms of age-related cataract, right eye: Secondary | ICD-10-CM | POA: Diagnosis not present

## 2017-08-25 DIAGNOSIS — H2511 Age-related nuclear cataract, right eye: Secondary | ICD-10-CM | POA: Diagnosis not present

## 2017-08-31 ENCOUNTER — Other Ambulatory Visit (HOSPITAL_COMMUNITY): Payer: Self-pay | Admitting: Internal Medicine

## 2017-09-02 DIAGNOSIS — H2512 Age-related nuclear cataract, left eye: Secondary | ICD-10-CM | POA: Diagnosis not present

## 2017-09-03 ENCOUNTER — Ambulatory Visit (HOSPITAL_COMMUNITY)
Admission: RE | Admit: 2017-09-03 | Discharge: 2017-09-03 | Disposition: A | Discharge status: Home / Self Care | Payer: PPO | Source: Ambulatory Visit | Attending: Internal Medicine | Admitting: Internal Medicine

## 2017-09-03 VITALS — BP 146/80 | HR 73 | Wt 132.2 lb

## 2017-09-03 DIAGNOSIS — Z95810 Presence of automatic (implantable) cardiac defibrillator: Secondary | ICD-10-CM | POA: Diagnosis not present

## 2017-09-03 DIAGNOSIS — I255 Ischemic cardiomyopathy: Secondary | ICD-10-CM | POA: Diagnosis not present

## 2017-09-03 DIAGNOSIS — Z801 Family history of malignant neoplasm of trachea, bronchus and lung: Secondary | ICD-10-CM | POA: Insufficient documentation

## 2017-09-03 DIAGNOSIS — F1010 Alcohol abuse, uncomplicated: Secondary | ICD-10-CM

## 2017-09-03 DIAGNOSIS — I5022 Chronic systolic (congestive) heart failure: Secondary | ICD-10-CM | POA: Diagnosis not present

## 2017-09-03 DIAGNOSIS — I252 Old myocardial infarction: Secondary | ICD-10-CM | POA: Diagnosis not present

## 2017-09-03 DIAGNOSIS — Z79899 Other long term (current) drug therapy: Secondary | ICD-10-CM | POA: Diagnosis not present

## 2017-09-03 DIAGNOSIS — I472 Ventricular tachycardia: Secondary | ICD-10-CM | POA: Diagnosis not present

## 2017-09-03 DIAGNOSIS — I493 Ventricular premature depolarization: Secondary | ICD-10-CM | POA: Diagnosis not present

## 2017-09-03 DIAGNOSIS — Z8262 Family history of osteoporosis: Secondary | ICD-10-CM | POA: Insufficient documentation

## 2017-09-03 DIAGNOSIS — Z8674 Personal history of sudden cardiac arrest: Secondary | ICD-10-CM | POA: Diagnosis not present

## 2017-09-03 DIAGNOSIS — Z888 Allergy status to other drugs, medicaments and biological substances status: Secondary | ICD-10-CM | POA: Diagnosis not present

## 2017-09-03 DIAGNOSIS — E785 Hyperlipidemia, unspecified: Secondary | ICD-10-CM | POA: Diagnosis not present

## 2017-09-03 DIAGNOSIS — I480 Paroxysmal atrial fibrillation: Secondary | ICD-10-CM | POA: Insufficient documentation

## 2017-09-03 DIAGNOSIS — I110 Hypertensive heart disease with heart failure: Secondary | ICD-10-CM | POA: Insufficient documentation

## 2017-09-03 DIAGNOSIS — I952 Hypotension due to drugs: Secondary | ICD-10-CM | POA: Diagnosis not present

## 2017-09-03 DIAGNOSIS — F17210 Nicotine dependence, cigarettes, uncomplicated: Secondary | ICD-10-CM | POA: Diagnosis not present

## 2017-09-03 DIAGNOSIS — Z7901 Long term (current) use of anticoagulants: Secondary | ICD-10-CM | POA: Insufficient documentation

## 2017-09-03 DIAGNOSIS — I2510 Atherosclerotic heart disease of native coronary artery without angina pectoris: Secondary | ICD-10-CM | POA: Diagnosis not present

## 2017-09-03 DIAGNOSIS — N400 Enlarged prostate without lower urinary tract symptoms: Secondary | ICD-10-CM | POA: Insufficient documentation

## 2017-09-03 LAB — BASIC METABOLIC PANEL
ANION GAP: 8 (ref 5–15)
BUN: 8 mg/dL (ref 8–23)
CHLORIDE: 103 mmol/L (ref 98–111)
CO2: 24 mmol/L (ref 22–32)
Calcium: 9.3 mg/dL (ref 8.9–10.3)
Creatinine, Ser: 0.89 mg/dL (ref 0.61–1.24)
GFR calc Af Amer: >60 mL/min (ref >60)
GLUCOSE: 99 mg/dL (ref 70–99)
POTASSIUM: 4.9 mmol/L (ref 3.5–5.1)
SODIUM: 135 mmol/L (ref 135–145)

## 2017-09-03 MED ORDER — SACUBITRIL-VALSARTAN 97-103 MG PO TABS: 1.0000 | ORAL_TABLET | Freq: Two times a day (BID) | ORAL | 6 refills | Status: DC

## 2017-09-03 NOTE — Patient Instructions (Signed)
Increase Entresto to 97/103 mg Twice daily   Lab today  Lab in 7-10 days

## 2017-09-03 NOTE — Progress Notes (Signed)
Advanced Heart Failure Clinic Note   Primary Cardiologist: Dr. Haroldine Laws  EP: Dr. Rayann Heman  HPI  [name] [name] is a 67 y.o. male with history of Systolic CHF, CAD, HTN, HLD, tobacco abuse, and BPH.   Had complicated admission 6/38/46 to 08/19/16 due to out of hospital cardiac arrest c/b cpr/defib and gi perforation thought 2/2 to rib fracture as well as large chest wall hematoma and t8 vertebral fracture thought all from CPR. Noted to have LV thrombus as well.  EP saw and recommended ICD as outpatient with long, complicated admission. Meds adjusted as tolerated. Pt underwent multiple procedures including exploratory laparotomy and repair of perforated stomach with an omental patch by General Surgery and evacuation of chest hematoma by TCTS. Cath as below with 3v disease though mostly non-obstructive, medical management pursued.  Lifevest placed prior to discharge.   He presents today for follow up. Feeling great overall. No DOE. No CP. BP tends to run lower at home. (100/70 last week). Smoking 1 PPD and couple of glasses a wine at night but says he has cut back. Denies overt bleeding on Eliquis, but bleeds easily when scraped and bruises. Denies lightheadedness or dizziness. No orthopnea/PND. No peripheral edema. Walks the dog 30 minutes a day and planted and tilled his garden without any DOE. More fatigued in hot weather, but otherwise no issues. ICD has not fired. No palpitations, syncope or presyncope.   ICD interrogation: Thoracic impedence consistently near baseline today. Above threshold for past several weeks. No VT/VF. Activity not reported. Personally reviewed   Echo 12/2016 40-45% with antero-septal AK   Echo 08/06/16 - LVEF 35-40%, grade 1 DD. Apparent medium-sized apical thrombus.   Left/Right heart cath 08/13/16  Ost RCA lesion, 80 %stenosed.  Ost LM lesion, 40 %stenosed.  Ost Ramus to Ramus lesion, 80 %stenosed.  Ost LAD to Prox LAD lesion, 70 %stenosed.  Ost 1st Diag to  1st Diag lesion, 75 %stenosed.  Mid LAD to Dist LAD lesion, 40 %stenosed.  Findings:  Ao = 141/70 (102) LV = 126/13 RA = 5 RV = 29/8 PA = 33/9 (22) PCW = 12 Fick cardiac output/index = 5.3/3.0 PVR = 1.9 WU Ao sat = 92%  PA sat = 55%,54%  Review of systems complete and found to be negative unless listed in HPI.    Past Medical History:  Diagnosis Date  . AICD (automatic cardioverter/defibrillator) present   . CAD (coronary artery disease)   . Coronary artery disease    1980's-90's MI with perhaps angioplasty  . Hypertension   . Ischemic cardiomyopathy   . LV (left ventricular) mural thrombus following MI ([place])   . Myocardial infarction ([place]) 1999  . Ventricular fibrillation ([place]) 07/2016       Current Outpatient Medications  Medication Sig Dispense Refill  . acetaminophen (TYLENOL) 325 MG tablet Take 2 tablets (650 mg total) by mouth every 4 (four) hours as needed for mild pain or headache.    . carvedilol (COREG) 6.25 MG tablet TAKE 1 TABLET(6.25 MG) BY MOUTH TWICE DAILY WITH A MEAL 180 tablet 3  . ELIQUIS 5 MG TABS tablet TAKE 1 TABLET BY MOUTH TWICE DAILY 60 tablet 3  . ENTRESTO 49-51 MG TAKE 1 TABLET BY MOUTH TWICE DAILY 60 tablet 3  . eplerenone (INSPRA) 25 MG tablet Take 0.5 tablets (12.5 mg total) by mouth daily. 45 tablet 3  . rosuvastatin (CRESTOR) 10 MG tablet Take 1 tablet (10 mg total) by mouth daily. 30 tablet 5  No current facility-administered medications for this encounter.    Allergies  Allergen Reactions  . Spironolactone Other (See Comments)    Breast pain--swelling/inflammation    Social History   Socioeconomic History  . Marital status: Married    Spouse name: Not on file  . Number of children: Not on file  . Years of education: Not on file  . Highest education level: Not on file  Occupational History  . Not on file  Social Needs  . Financial resource strain: Not on file  . Food insecurity:    Worry: Not on file    Inability: Not  on file  . Transportation needs:    Medical: Not on file    Non-medical: Not on file  Tobacco Use  . Smoking status: Current Every Day Smoker    Packs/day: 1.00    Years: 25.00    Pack years: 25.00    Types: Cigarettes  . Smokeless tobacco: Former Systems developer    Types: Chew  Substance and Sexual Activity  . Alcohol use: Yes    Alcohol/week: 14.0 standard drinks    Types: 14 Glasses of wine per week    Comment: 10/29/2016 "couple glasses of red wine/day"  . Drug use: No  . Sexual activity: Not Currently  Lifestyle  . Physical activity:    Days per week: Not on file    Minutes per session: Not on file  . Stress: Not on file  Relationships  . Social connections:    Talks on phone: Not on file    Gets together: Not on file    Attends religious service: Not on file    Active member of club or organization: Not on file    Attends meetings of clubs or organizations: Not on file    Relationship status: Not on file  . Intimate partner violence:    Fear of current or ex partner: Not on file    Emotionally abused: Not on file    Physically abused: Not on file    Forced sexual activity: Not on file  Other Topics Concern  . Not on file  Social History Narrative   Lives in [place] with spouse    Family History  Problem Relation Age of Onset  . Osteoporosis Mother   . Lung cancer Father   . Other Brother        died in [place]   Vitals:   10/02/17 1224  BP: (!) 146/80  Pulse: 73  SpO2: 98%  Weight: 60 kg (132 lb 3.2 oz)   Wt Readings from Last 3 Encounters:  October 02, 2017 60 kg (132 lb 3.2 oz)  02/02/17 61.2 kg (135 lb)  01/08/17 59.9 kg (132 lb)    PHYSICAL EXAM: General: Well appearing. No resp difficulty. HEENT: Normal anicteric Neck: Supple. JVP 6-7 cm. Carotids 2+ bilat; no bruits. No thyromegaly or nodule noted. Cor: PMI nondisplaced. RRR, No M/G/R noted Lungs: CTAB, normal effort.no wheeze Abdomen: Soft, non-tender, non-distended, no HSM. No bruits or masses. +BS   Extremities: no cyanosis, clubbing, rash, edema Neuro: alert & oriented x 3, cranial nerves grossly intact. moves all 4 extremities w/o difficulty. Affect pleasant  ASSESSMENT & PLAN:  1. CAD with ICM s/p Cardiac Arrest VT/VF:  - Cath 08/13/16 with 3V disease (mostly non-obstructive)  medical therapy. If develops angina can consider PCI to Ramus.  - Stable.  - No s/s of ischemia.    - Continue statin.  - OFF stop ASA with anticoagulation.  2. Chronic systolic  HF.  - Echo 08/06/16 - LVEF 35-40%, grade 1 DD. Apparent medium-sized apical thrombus.  - Echo 12/2016 EF 40-45% No LV thrombus - NYHA II.  - Volume status stable on exam.  - Increase entresto 97/103 mg BID. Watch BP closely. He knows to drop back down if gets dizzy/[presyncopal - Continue coreg 6.25 mg BID.  - Continue eplerenone 12.5 mg daily.  (did not tolerate spiro due to painful gynecomastia) - Reinforced fluid restriction to < 2 L daily, sodium restriction to less than 2000 mg daily, and the importance of daily weights.   3. Perforated stomach s/p repair. - happened in setting of CPR s/p repair.  - Doing well.  4. 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  - Continue Coreg 6.25 mg BID.  5. LV Thrombus:  - Resolved on echo 12/2016. Personally reviewed by Dr. Haroldine Laws. 6. ETOH and tobacco abuse.  - Encouraged complete cessation. He is not interested at this time.  7. PAF - Regular on exam today.  - Continue Eliquis 5 mg BID.   Shirley Friar, PA-C 09/03/17   Patient seen and examined with the above-signed Advanced Practice Provider and/or Housestaff. I personally reviewed laboratory data, imaging studies and relevant notes. I independently examined the patient and formulated the important aspects of the plan. I have edited the note to reflect any of my changes or salient points. I have personally discussed the plan with the patient and/or family.  Doing very well. NYHA II. Volume status looks good on exam and ICD  interrogation. No VT or AF. Will increase Entresto to 97/103 bid. Watch BP closely. Can cut back as needed. CAD stable without evidence of ischemia. PVCs improved.   Labs today and 1 week.   Glori Bickers, MD  8:57 PM

## 2017-09-11 ENCOUNTER — Ambulatory Visit (HOSPITAL_COMMUNITY)
Admission: RE | Admit: 2017-09-11 | Discharge: 2017-09-11 | Disposition: A | Discharge status: Home / Self Care | Payer: PPO | Source: Ambulatory Visit | Attending: Cardiology | Admitting: Cardiology

## 2017-09-11 DIAGNOSIS — I5022 Chronic systolic (congestive) heart failure: Secondary | ICD-10-CM | POA: Diagnosis not present

## 2017-09-11 LAB — BASIC METABOLIC PANEL
Anion gap: 9 (ref 5–15)
BUN: 7 mg/dL — AB (ref 8–23)
CALCIUM: 9.4 mg/dL (ref 8.9–10.3)
CO2: 25 mmol/L (ref 22–32)
CREATININE: 0.87 mg/dL (ref 0.61–1.24)
Chloride: 100 mmol/L (ref 98–111)
GFR calc Af Amer: >60 mL/min (ref >60)
GLUCOSE: 93 mg/dL (ref 70–99)
Potassium: 4.6 mmol/L (ref 3.5–5.1)
SODIUM: 134 mmol/L — AB (ref 135–145)

## 2017-09-15 ENCOUNTER — Telehealth: Payer: Self-pay | Admitting: Cardiology

## 2017-09-15 ENCOUNTER — Ambulatory Visit (INDEPENDENT_AMBULATORY_CARE_PROVIDER_SITE_OTHER): Payer: PPO

## 2017-09-15 DIAGNOSIS — Z95810 Presence of automatic (implantable) cardiac defibrillator: Secondary | ICD-10-CM | POA: Diagnosis not present

## 2017-09-15 DIAGNOSIS — I5022 Chronic systolic (congestive) heart failure: Secondary | ICD-10-CM | POA: Diagnosis not present

## 2017-09-15 DIAGNOSIS — H2512 Age-related nuclear cataract, left eye: Secondary | ICD-10-CM | POA: Diagnosis not present

## 2017-09-15 DIAGNOSIS — H2181 Floppy iris syndrome: Secondary | ICD-10-CM | POA: Diagnosis not present

## 2017-09-15 DIAGNOSIS — H25812 Combined forms of age-related cataract, left eye: Secondary | ICD-10-CM | POA: Diagnosis not present

## 2017-09-15 NOTE — Telephone Encounter (Signed)
Spoke with pt and reminded pt of remote transmission that is due today. Pt verbalized understanding.   

## 2017-09-15 NOTE — Progress Notes (Signed)
EPIC Encounter for ICM Monitoring  Patient Name: [name] [name] is a 67 y.o. male Date: 09/15/2017 Primary Care Physican: [place], [place] At Primary Cardiologist:[place] Electrophysiologist:Allred Dry Weight:131lbs       Heart Failure questions reviewed, pt asymptomatic.  Has had bilateral cataract removal.    Thoracic impedance normal.  Labs: 09/11/2017 Creatinine 0.87, BUN 7, Potassium 4.6, Sodium 134, EGFR >60 09/03/2017 Creatinine 0.89, BUN 8, Potassium 4.9, Sodium 135, EGFR >60  A complete set of results can be found in Results Review.  Recommendations: No changes.   Encouraged to call for fluid symptoms.  Follow-up plan: ICM clinic phone appointment on 11/23/2017.   Office appointment scheduled 10/23/2017 with Chanetta Marshall, NP.    Copy of ICM check sent to Dr. Rayann Heman.   3 month ICM trend: 09/15/2017    1 Year ICM trend:       Rosalene Billings, RN 09/15/2017 11:59 AM

## 2017-09-24 LAB — CUP PACEART REMOTE DEVICE CHECK
Battery Remaining Longevity: 94 mo
Battery Voltage: 3.13 V
Date Time Interrogation Session: 20190801060017
HIGH POWER IMPEDANCE MEASURED VALUE: 72 Ohm
HighPow Impedance: 72 Ohm
Implantable Pulse Generator Implant Date: 20181017
Lead Channel Sensing Intrinsic Amplitude: 12 mV
Lead Channel Setting Pacing Amplitude: 2.5 V
Lead Channel Setting Pacing Pulse Width: 0.5 ms
Lead Channel Setting Sensing Sensitivity: 0.5 mV
MDC IDC LEAD IMPLANT DT: 20181017
MDC IDC LEAD LOCATION: 753860
MDC IDC MSMT BATTERY REMAINING PERCENTAGE: 91 %
MDC IDC MSMT LEADCHNL RV IMPEDANCE VALUE: 440 Ohm
MDC IDC MSMT LEADCHNL RV PACING THRESHOLD AMPLITUDE: 0.5 V
MDC IDC MSMT LEADCHNL RV PACING THRESHOLD PULSEWIDTH: 0.5 ms
MDC IDC STAT BRADY RV PERCENT PACED: 1 %
Pulse Gen Serial Number: 9781512

## 2017-09-26 ENCOUNTER — Other Ambulatory Visit (HOSPITAL_COMMUNITY): Payer: Self-pay | Admitting: Internal Medicine

## 2017-10-15 ENCOUNTER — Other Ambulatory Visit: Payer: Self-pay | Admitting: Internal Medicine

## 2017-10-22 NOTE — Progress Notes (Signed)
Electrophysiology Office Note Date: 10/23/2017  ID:  [name] [name], DOB Nov 17, 1950, MRN [id number]  PCP: System, Pcp Not In Primary Cardiologist: [place] Electrophysiologist: All[facility]  CC: Routine ICD follow-up  [name] [name] is a 67 y.o. male seen today for Dr Rayann Heman.  He presents today for routine electrophysiology followup.  Since last being seen in our clinic, the patient reports doing very well. He denies chest pain, palpitations, dyspnea, PND, orthopnea, nausea, vomiting, dizziness, syncope, edema, weight gain, or early satiety.  He has not had ICD shocks.   Device History: STJ single chamber ICD implanted 2018 for VF arrest History of appropriate therapy: No History of AAD therapy: No   Past Medical History:  Diagnosis Date  . AICD (automatic cardioverter/defibrillator) present   . CAD (coronary artery disease)   . Coronary artery disease    1980's-90's MI with perhaps angioplasty  . Hypertension   . Ischemic cardiomyopathy   . LV (left ventricular) mural thrombus following MI ([facility])   . Myocardial infarction ([facility]) 1999  . Ventricular fibrillation ([facility]) 07/2016       Past Surgical History:  Procedure Laterality Date  . CORONARY ANGIOPLASTY  1999   "Dr. Vidal Schwalbe"  . HEMATOMA EVACUATION Left 08/15/2016   Procedure: INSERTION OF DRAIN ANTERIOR LEFT CHEST WALL HEMATOMA;  Surgeon: Ivin Poot, MD;  Location: [facility];  Service: Thoracic;  Laterality: Left;  . ICD IMPLANT N/A 10/29/2016   [employer] [place] VR model [phone] (serial  Number [id number]) ICD for secondary prevention of sudden death after prior VF arrest by Dr Rayann Heman  . LAPAROTOMY N/A 08/04/2016   Procedure: EXPLORATORY LAPAROTOMY FREE AIR BIOPSY OF PERFORATED STOMACH ULCER REPAIR OF PERFORATED STOMACH ULCER WITH A OMENTAL PATCH;  Surgeon: Georganna Skeans, MD;  Location: [facility];  Service: General;  Laterality: N/A;  . RIGHT/LEFT HEART CATH AND CORONARY ANGIOGRAPHY N/A 08/13/2016   Procedure:  Right/Left Heart Cath and Coronary Angiography;  Surgeon: Jolaine Artist, MD;  Location: [facility];  Service: Cardiovascular;  Laterality: N/A;    Current Outpatient Medications  Medication Sig Dispense Refill  . acetaminophen (TYLENOL) 325 MG tablet Take 2 tablets (650 mg total) by mouth every 4 (four) hours as needed for mild pain or headache.    . carvedilol (COREG) 6.25 MG tablet TAKE 1 TABLET(6.25 MG) BY MOUTH TWICE DAILY WITH A MEAL 180 tablet 3  . ELIQUIS 5 MG TABS tablet TAKE 1 TABLET BY MOUTH TWICE DAILY 60 tablet 3  . eplerenone (INSPRA) 25 MG tablet Take 0.5 tablets (12.5 mg total) by mouth daily. 15 tablet 3  . rosuvastatin (CRESTOR) 10 MG tablet Take 1 tablet (10 mg total) by mouth daily. 30 tablet 5  . sacubitril-valsartan (ENTRESTO) 97-103 MG Take 1 tablet by mouth 2 (two) times daily. 60 tablet 6   No current facility-administered medications for this visit.     Allergies:   Spironolactone   Social History: Social History   Socioeconomic History  . Marital status: Married    Spouse name: Not on file  . Number of children: Not on file  . Years of education: Not on file  . Highest education level: Not on file  Occupational History  . Not on file  Social Needs  . Financial resource strain: Not on file  . Food insecurity:    Worry: Not on file    Inability: Not on file  . Transportation needs:    Medical: Not on file  Non-medical: Not on file  Tobacco Use  . Smoking status: Current Every Day Smoker    Packs/day: 1.00    Years: 25.00    Pack years: 25.00    Types: Cigarettes  . Smokeless tobacco: Former Systems developer    Types: Chew  Substance and Sexual Activity  . Alcohol use: Yes    Alcohol/week: 14.0 standard drinks    Types: 14 Glasses of wine per week    Comment: 10/29/2016 "couple glasses of red wine/day"  . Drug use: No  . Sexual activity: Not Currently  Lifestyle  . Physical activity:    Days per week: Not on file    Minutes per session:  Not on file  . Stress: Not on file  Relationships  . Social connections:    Talks on phone: Not on file    Gets together: Not on file    Attends religious service: Not on file    Active member of club or organization: Not on file    Attends meetings of clubs or organizations: Not on file    Relationship status: Not on file  . Intimate partner violence:    Fear of current or ex partner: Not on file    Emotionally abused: Not on file    Physically abused: Not on file    Forced sexual activity: Not on file  Other Topics Concern  . Not on file  Social History Narrative   Lives in [place] with spouse    Family History: Family History  Problem Relation Age of Onset  . Osteoporosis Mother   . Lung cancer Father   . Other Brother        died in [place]    Review of Systems: All other systems reviewed and are otherwise negative except as noted above.   Physical Exam: VS:  BP 100/80   Pulse 64   Ht 5\' 8"  (1.727 m)   Wt 135 lb 3.2 oz (61.3 kg)   BMI 20.56 kg/m  , BMI Body mass index is 20.56 kg/m.  GEN- The patient is well appearing, alert and oriented x 3 today.   HEENT: normocephalic, atraumatic; sclera clear, conjunctiva pink; hearing intact; oropharynx clear; neck supple  Lungs- Clear to ausculation bilaterally, normal work of breathing.  No wheezes, rales, rhonchi Heart- Regular rate and rhythm  GI- soft, non-tender, non-distended, bowel sounds present  Extremities- no clubbing, cyanosis, or edema  MS- no significant deformity or atrophy Skin- warm and dry, no rash or lesion; ICD pocket well healed Psych- euthymic mood, full affect Neuro- strength and sensation are intact  ICD interrogation- reviewed in detail today,  See PACEART report  EKG:  EKG is not ordered today.  Recent Labs: 01/08/2017: Hemoglobin 15.5; Platelets 248 09/11/2017: BUN 7; Creatinine, Ser 0.87; Potassium 4.6; Sodium 134   Wt Readings from Last 3 Encounters:  10/23/17 135 lb 3.2 oz (61.3  kg)  09/03/17 132 lb 3.2 oz (60 kg)  02/02/17 135 lb (61.2 kg)     Other studies Reviewed: Additional studies/ records that were reviewed today include: Dr Rayann Heman and Dr Bensimhon's office notes   Assessment and Plan:  1.  VF arrest No recurrent ventricular arrhythmias Keep K>3.9, Mg >1.8  2.  Chronic systolic dysfunction euvolemic today Stable on an appropriate medical regimen Normal ICD function See Pace Art report No changes today  3.  Paroxysmal atrial fibrillation Continue Eliquis for CHADS2VASC of 4   Current medicines are reviewed at length with the patient today.  The patient does not have concerns regarding his medicines.  The following changes were made today:  none  Labs/ tests ordered today include: none Orders Placed This Encounter  Procedures  . CUP PACEART INCLINIC DEVICE CHECK     Disposition:   Follow up with Dr Haroldine Laws as scheduled, Delilah Shan, ICM clinic, me in 1 year    Signed, Chanetta Marshall, NP 10/23/2017 9:54 AM  [facility] [address] [place] [address] [phone] (office) [phone] (fax)

## 2017-10-23 ENCOUNTER — Ambulatory Visit: Payer: PPO | Admitting: Nurse Practitioner

## 2017-10-23 ENCOUNTER — Encounter: Payer: Self-pay | Admitting: Nurse Practitioner

## 2017-10-23 ENCOUNTER — Encounter (INDEPENDENT_AMBULATORY_CARE_PROVIDER_SITE_OTHER): Payer: Self-pay

## 2017-10-23 VITALS — BP 100/80 | HR 64 | Ht 68.0 in | Wt 135.2 lb

## 2017-10-23 DIAGNOSIS — I5022 Chronic systolic (congestive) heart failure: Secondary | ICD-10-CM | POA: Diagnosis not present

## 2017-10-23 DIAGNOSIS — I480 Paroxysmal atrial fibrillation: Secondary | ICD-10-CM

## 2017-10-23 DIAGNOSIS — I469 Cardiac arrest, cause unspecified: Secondary | ICD-10-CM

## 2017-10-23 LAB — CUP PACEART INCLINIC DEVICE CHECK
Date Time Interrogation Session: 20191011090622
MDC IDC LEAD IMPLANT DT: 20181017
MDC IDC LEAD LOCATION: 753860
MDC IDC PG IMPLANT DT: 20181017
Pulse Gen Serial Number: 9781512

## 2017-10-23 NOTE — Patient Instructions (Addendum)
Medication Instructions:  Your physician recommends that you continue on your current medications as directed. Please refer to the Current Medication list given to you today.  -- If you need a refill on your cardiac medications before your next appointment, please call your pharmacy. --  Labwork: None ordered  Testing/Procedures: None ordered  Follow-Up: Remote monitoring is used to monitor your ICD from home. This monitoring reduces the number of office visits required to check your device to one time per year. It allows [place] to keep an eye on the functioning of your device to ensure it is working properly. You are scheduled for a device check from home on 11/12/17 and 11/23/17. You may send your transmission at any time that day. If you have a wireless device, the transmission will be sent automatically. After your physician reviews your transmission, you will receive a postcard with your next transmission date.   Your physician wants you to follow-up in: 1 year with  [place] will receive a reminder letter in the mail two months in advance. If you don't receive a letter, please call our office to schedule the follow-up appointment.  Thank you for choosing [facility]!!    Any Other Special Instructions Will Be Listed Below (If Applicable).

## 2017-11-02 DIAGNOSIS — Z961 Presence of intraocular lens: Secondary | ICD-10-CM | POA: Diagnosis not present

## 2017-11-12 ENCOUNTER — Ambulatory Visit (INDEPENDENT_AMBULATORY_CARE_PROVIDER_SITE_OTHER): Payer: PPO | Admitting: *Deleted

## 2017-11-12 DIAGNOSIS — I255 Ischemic cardiomyopathy: Secondary | ICD-10-CM | POA: Diagnosis not present

## 2017-11-12 DIAGNOSIS — I5022 Chronic systolic (congestive) heart failure: Secondary | ICD-10-CM

## 2017-11-12 NOTE — Progress Notes (Signed)
Remote ICD transmission.   

## 2017-11-20 ENCOUNTER — Encounter: Payer: Self-pay | Admitting: Cardiology

## 2017-11-23 ENCOUNTER — Ambulatory Visit (INDEPENDENT_AMBULATORY_CARE_PROVIDER_SITE_OTHER): Payer: PPO

## 2017-11-23 DIAGNOSIS — I5022 Chronic systolic (congestive) heart failure: Secondary | ICD-10-CM | POA: Diagnosis not present

## 2017-11-23 DIAGNOSIS — Z95810 Presence of automatic (implantable) cardiac defibrillator: Secondary | ICD-10-CM | POA: Diagnosis not present

## 2017-11-24 NOTE — Progress Notes (Signed)
EPIC Encounter for ICM Monitoring  Patient Name: [name] [name] is a 67 y.o. male Date: 11/24/2017 Primary Care Physican: System, Pcp Not In Primary Cardiologist:Bensimhon Electrophysiologist:Allred Last Weight: 131lbs  Today's Weight: 132 lbs       Heart Failure questions reviewed, pt asymptomatic.   Thoracic impedance normal.   Labs: 09/11/2017 Creatinine 0.87, BUN 7, Potassium 4.6, Sodium 134, EGFR >60 09/03/2017 Creatinine 0.89, BUN 8, Potassium 4.9, Sodium 135, EGFR >60  A complete set of results can be found in Results Review.  Recommendations: No changes.    Encouraged to call for fluid symptoms.  Follow-up plan: ICM clinic phone appointment on 12/24/2017.   Office appointment scheduled 12/23/2017 with Dr. Haroldine Laws.    Copy of ICM check sent to Dr. Rayann Heman.   3 month ICM trend: 11/23/2017    1 Year ICM trend:       Rosalene Billings, RN 11/24/2017 9:15 AM

## 2017-11-30 ENCOUNTER — Other Ambulatory Visit (HOSPITAL_COMMUNITY): Payer: Self-pay | Admitting: Internal Medicine

## 2017-12-01 DIAGNOSIS — Z23 Encounter for immunization: Secondary | ICD-10-CM | POA: Diagnosis not present

## 2017-12-01 DIAGNOSIS — Z0000 Encounter for general adult medical examination without abnormal findings: Secondary | ICD-10-CM | POA: Diagnosis not present

## 2017-12-23 ENCOUNTER — Ambulatory Visit (HOSPITAL_COMMUNITY): Admission: RE | Admit: 2017-12-23 | Payer: PPO | Source: Ambulatory Visit

## 2017-12-23 ENCOUNTER — Encounter (HOSPITAL_COMMUNITY): Payer: PPO | Admitting: Internal Medicine

## 2017-12-24 ENCOUNTER — Ambulatory Visit (INDEPENDENT_AMBULATORY_CARE_PROVIDER_SITE_OTHER): Payer: PPO

## 2017-12-24 DIAGNOSIS — Z95810 Presence of automatic (implantable) cardiac defibrillator: Secondary | ICD-10-CM

## 2017-12-24 DIAGNOSIS — I5022 Chronic systolic (congestive) heart failure: Secondary | ICD-10-CM

## 2017-12-25 NOTE — Progress Notes (Signed)
EPIC Encounter for ICM Monitoring  Patient Name: [name] [name] is a 67 y.o. male Date: 12/25/2017 Primary Care Physican: [place] [place] [facility] Primary Cardiologist:Bensimhon Electrophysiologist:Allred Last Weight: 132lbs                                                   Transmission reviewed   Thoracic impedance normal.   Labs: 09/11/2017 Creatinine0.87, BUN7, Potassium4.6, Sodium134, EGFR>60 09/03/2017 Creatinine0.89, BUN8, Potassium4.9, Sodium135, EGFR>60 A complete set of results can be found in Results Review.  Recommendations: None  Follow-up plan: ICM clinic phone appointment on 02/11/2018.     Copy of ICM check sent to Dr. Rayann Heman  3 month ICM trend: 12/24/2017    1 Year ICM trend:       Rosalene Billings, RN 12/25/2017 11:55 AM

## 2018-01-12 LAB — CUP PACEART REMOTE DEVICE CHECK
Battery Remaining Longevity: 91 mo
Battery Voltage: 3.08 V
Date Time Interrogation Session: 20191031060017
HIGH POWER IMPEDANCE MEASURED VALUE: 72 Ohm
HighPow Impedance: 72 Ohm
Implantable Pulse Generator Implant Date: 20181017
Lead Channel Impedance Value: 490 Ohm
Lead Channel Sensing Intrinsic Amplitude: 12 mV
Lead Channel Setting Pacing Amplitude: 2.5 V
Lead Channel Setting Sensing Sensitivity: 0.5 mV
MDC IDC LEAD IMPLANT DT: 20181017
MDC IDC LEAD LOCATION: 753860
MDC IDC MSMT BATTERY REMAINING PERCENTAGE: 89 %
MDC IDC MSMT LEADCHNL RV PACING THRESHOLD AMPLITUDE: 0.5 V
MDC IDC MSMT LEADCHNL RV PACING THRESHOLD PULSEWIDTH: 0.5 ms
MDC IDC PG SERIAL: 9781512
MDC IDC SET LEADCHNL RV PACING PULSEWIDTH: 0.5 ms
MDC IDC STAT BRADY RV PERCENT PACED: 1 %

## 2018-01-21 ENCOUNTER — Other Ambulatory Visit (HOSPITAL_COMMUNITY): Payer: Self-pay | Admitting: Internal Medicine

## 2018-02-11 ENCOUNTER — Telehealth (HOSPITAL_COMMUNITY): Payer: Self-pay | Admitting: Licensed Clinical Social Worker

## 2018-02-11 ENCOUNTER — Ambulatory Visit (INDEPENDENT_AMBULATORY_CARE_PROVIDER_SITE_OTHER): Payer: PPO

## 2018-02-11 DIAGNOSIS — I255 Ischemic cardiomyopathy: Secondary | ICD-10-CM

## 2018-02-11 NOTE — Telephone Encounter (Signed)
CSW spoke with pt about renewing PAN foundation- pt agreeable.  CSW submitted renewal and pt approved:  Member ID: [id number] Group ID: [id number] RxBin ID: [id number] PCN: [id number] Eligibility Start Date: 01/16/2018 Eligibility End Date: 01/16/2019 Assistance Amount: $1,000.00  Jorge Ny, LCSW Clinical Social Worker [facility] [phone]

## 2018-02-12 ENCOUNTER — Ambulatory Visit (INDEPENDENT_AMBULATORY_CARE_PROVIDER_SITE_OTHER): Payer: PPO

## 2018-02-12 DIAGNOSIS — I5022 Chronic systolic (congestive) heart failure: Secondary | ICD-10-CM | POA: Diagnosis not present

## 2018-02-12 DIAGNOSIS — Z95810 Presence of automatic (implantable) cardiac defibrillator: Secondary | ICD-10-CM

## 2018-02-12 LAB — CUP PACEART REMOTE DEVICE CHECK
Battery Remaining Longevity: 90 mo
Battery Voltage: 3.04 V
Brady Statistic RV Percent Paced: 1 %
Date Time Interrogation Session: 20200130070017
HighPow Impedance: 72 Ohm
HighPow Impedance: 72 Ohm
Implantable Lead Location: 753860
Lead Channel Impedance Value: 490 Ohm
Lead Channel Pacing Threshold Pulse Width: 0.5 ms
Lead Channel Sensing Intrinsic Amplitude: 12 mV
Lead Channel Setting Pacing Amplitude: 2.5 V
Lead Channel Setting Sensing Sensitivity: 0.5 mV
MDC IDC LEAD IMPLANT DT: 20181017
MDC IDC MSMT BATTERY REMAINING PERCENTAGE: 87 %
MDC IDC MSMT LEADCHNL RV PACING THRESHOLD AMPLITUDE: 0.5 V
MDC IDC PG IMPLANT DT: 20181017
MDC IDC SET LEADCHNL RV PACING PULSEWIDTH: 0.5 ms
Pulse Gen Serial Number: 9781512

## 2018-02-12 NOTE — Progress Notes (Signed)
EPIC Encounter for ICM Monitoring  Patient Name: [name] [name] is a 68 y.o. male Date: 02/12/2018 Primary Care Physican: [place] [place] [facility] Primary Cardiologist:Bensimhon Electrophysiologist:Allred Last Weight:132lbs Today's Weight: 134 lbs   Heart failure questions reviewed and he is doing fine.   Thoracic impedance normal.  Labs: 09/11/2017 Creatinine0.87, BUN7, Potassium4.6, Sodium134, EGFR>60 09/03/2017 Creatinine0.89, BUN8, Potassium4.9, Sodium135, EGFR>60 A complete set of results can be found in Results Review.  Recommendations:No changes and encouraged to call for fluid symptoms.  Follow-up plan: ICM clinic phone appointment on3/03/2018. Office appointment and echo scheduled 02/26/2018 with Dr Haroldine Laws  Copy of ICM check sent to Dr.Allred  3 month ICM trend: 02/11/2018    1 Year ICM trend:       Rosalene Billings, RN 02/12/2018 11:08 AM

## 2018-02-16 ENCOUNTER — Other Ambulatory Visit (HOSPITAL_COMMUNITY): Payer: Self-pay | Admitting: Internal Medicine

## 2018-02-18 NOTE — Progress Notes (Signed)
Remote ICD transmission.   

## 2018-02-22 ENCOUNTER — Encounter: Payer: Self-pay | Admitting: Cardiology

## 2018-02-26 ENCOUNTER — Ambulatory Visit (HOSPITAL_COMMUNITY)
Admission: RE | Admit: 2018-02-26 | Discharge: 2018-02-26 | Disposition: A | Discharge status: Home / Self Care | Payer: PPO | Source: Ambulatory Visit | Attending: Internal Medicine | Admitting: Internal Medicine

## 2018-02-26 ENCOUNTER — Encounter (HOSPITAL_COMMUNITY): Payer: Self-pay | Admitting: Internal Medicine

## 2018-02-26 ENCOUNTER — Ambulatory Visit (HOSPITAL_BASED_OUTPATIENT_CLINIC_OR_DEPARTMENT_OTHER)
Admission: RE | Admit: 2018-02-26 | Discharge: 2018-02-26 | Disposition: A | Discharge status: Home / Self Care | Payer: PPO | Source: Ambulatory Visit | Attending: Internal Medicine | Admitting: Internal Medicine

## 2018-02-26 VITALS — BP 164/88 | HR 75 | Wt 137.6 lb

## 2018-02-26 DIAGNOSIS — Z79899 Other long term (current) drug therapy: Secondary | ICD-10-CM | POA: Diagnosis not present

## 2018-02-26 DIAGNOSIS — Z7901 Long term (current) use of anticoagulants: Secondary | ICD-10-CM | POA: Diagnosis not present

## 2018-02-26 DIAGNOSIS — I2510 Atherosclerotic heart disease of native coronary artery without angina pectoris: Secondary | ICD-10-CM | POA: Insufficient documentation

## 2018-02-26 DIAGNOSIS — K631 Perforation of intestine (nontraumatic): Secondary | ICD-10-CM | POA: Diagnosis not present

## 2018-02-26 DIAGNOSIS — I493 Ventricular premature depolarization: Secondary | ICD-10-CM | POA: Insufficient documentation

## 2018-02-26 DIAGNOSIS — Z95810 Presence of automatic (implantable) cardiac defibrillator: Secondary | ICD-10-CM | POA: Insufficient documentation

## 2018-02-26 DIAGNOSIS — I480 Paroxysmal atrial fibrillation: Secondary | ICD-10-CM | POA: Diagnosis not present

## 2018-02-26 DIAGNOSIS — I5022 Chronic systolic (congestive) heart failure: Secondary | ICD-10-CM | POA: Diagnosis not present

## 2018-02-26 DIAGNOSIS — I255 Ischemic cardiomyopathy: Secondary | ICD-10-CM | POA: Diagnosis not present

## 2018-02-26 DIAGNOSIS — I252 Old myocardial infarction: Secondary | ICD-10-CM | POA: Diagnosis not present

## 2018-02-26 DIAGNOSIS — Z888 Allergy status to other drugs, medicaments and biological substances status: Secondary | ICD-10-CM | POA: Insufficient documentation

## 2018-02-26 DIAGNOSIS — I4901 Ventricular fibrillation: Secondary | ICD-10-CM | POA: Insufficient documentation

## 2018-02-26 DIAGNOSIS — I472 Ventricular tachycardia: Secondary | ICD-10-CM | POA: Diagnosis not present

## 2018-02-26 DIAGNOSIS — F17210 Nicotine dependence, cigarettes, uncomplicated: Secondary | ICD-10-CM | POA: Diagnosis not present

## 2018-02-26 DIAGNOSIS — Z801 Family history of malignant neoplasm of trachea, bronchus and lung: Secondary | ICD-10-CM | POA: Diagnosis not present

## 2018-02-26 DIAGNOSIS — I110 Hypertensive heart disease with heart failure: Secondary | ICD-10-CM | POA: Insufficient documentation

## 2018-02-26 LAB — BASIC METABOLIC PANEL
Anion gap: 12 (ref 5–15)
BUN: 10 mg/dL (ref 8–23)
CALCIUM: 9.6 mg/dL (ref 8.9–10.3)
CO2: 23 mmol/L (ref 22–32)
CREATININE: 1.06 mg/dL (ref 0.61–1.24)
Chloride: 96 mmol/L — ABNORMAL LOW (ref 98–111)
GFR calc Af Amer: >60 mL/min (ref >60)
Glucose, Bld: 95 mg/dL (ref 70–99)
POTASSIUM: 5.6 mmol/L — AB (ref 3.5–5.1)
SODIUM: 131 mmol/L — AB (ref 135–145)

## 2018-02-26 LAB — CBC
HCT: 42.6 % (ref 39.0–52.0)
Hemoglobin: 14.9 g/dL (ref 13.0–17.0)
MCH: 38.1 pg — ABNORMAL HIGH (ref 26.0–34.0)
MCHC: 35 g/dL (ref 30.0–36.0)
MCV: 109 fL — ABNORMAL HIGH (ref 80.0–100.0)
PLATELETS: 174 10*3/uL (ref 150–400)
RBC: 3.91 MIL/uL — ABNORMAL LOW (ref 4.22–5.81)
RDW: 12.4 % (ref 11.5–15.5)
WBC: 6.4 10*3/uL (ref 4.0–10.5)
nRBC: 0 % (ref 0.0–0.2)

## 2018-02-26 LAB — BRAIN NATRIURETIC PEPTIDE: B NATRIURETIC PEPTIDE 5: 172 pg/mL — AB (ref 0.0–100.0)

## 2018-02-26 MED ORDER — CARVEDILOL 6.25 MG PO TABS: 9.3750 mg | ORAL_TABLET | Freq: Two times a day (BID) | ORAL | 3 refills | Status: DC

## 2018-02-26 MED ORDER — PERFLUTREN LIPID MICROSPHERE
1.0000 mL | INTRAVENOUS | Status: DC | PRN
  Administered 2018-02-26: 2 mL via INTRAVENOUS
  Filled 2018-02-26: qty 10

## 2018-02-26 NOTE — Progress Notes (Signed)
  Echocardiogram 2D Echocardiogram has been performed.  Jennette Dubin 02/26/2018, 1:11 PM

## 2018-02-26 NOTE — Patient Instructions (Signed)
INCREASE Carvedilol 9.375mg  (1.5 tabs) twice a day  Labs today We will only contact you if something comes back abnormal or we need to make some changes. Otherwise no news is good news!  Your physician recommends that you schedule a follow-up appointment in: 1 year, office will call you to schedule this appointment. Please call in December if you do not get a call.

## 2018-02-26 NOTE — Progress Notes (Signed)
[facility] Note   Primary Cardiologist: Dr. Haroldine Laws  EP: Dr. Rayann Heman  HPI  DAHLTON HINDE is a 68 y.o. male with history of Systolic CHF, CAD, HTN, HLD, tobacco abuse, and BPH.   Had complicated admission 2/45/80 to 08/19/16 due to out of hospital cardiac arrest c/b cpr/defib and gi perforation thought 2/2 to rib fracture as well as large chest wall hematoma and t8 vertebral fracture thought all from CPR. Noted to have LV thrombus as well.  EP saw and recommended ICD as outpatient with long, complicated admission. Meds adjusted as tolerated. Pt underwent multiple procedures including exploratory laparotomy and repair of perforated stomach with an omental patch by General Surgery and evacuation of chest hematoma by TCTS. Cath as below with 3v disease though mostly non-obstructive, medical management pursued.  Lifevest placed prior to discharge.   He presents today for follow up. Feels great. His 49-year-old Tracie Harrier Spaniel walks him 6 days per week. No CP or SOB. No edema. No ICD firings. Compliant with all his medications.   ICD interrogation: No VT/AF impedance looks good. Personally reviewed   Echo today 02/26/18  40-45% with antero-septal AK   Echo 12/2016 40-45% with antero-septal AK   Echo 08/06/16 - LVEF 35-40%, grade 1 DD. Apparent medium-sized apical thrombus.   Left/Right heart cath 08/13/16  Ost RCA lesion, 80 %stenosed.  Ost LM lesion, 40 %stenosed.  Ost Ramus to Ramus lesion, 80 %stenosed.  Ost LAD to Prox LAD lesion, 70 %stenosed.  Ost 1st Diag to 1st Diag lesion, 75 %stenosed.  Mid LAD to Dist LAD lesion, 40 %stenosed.  Findings:  Ao = 141/70 (102) LV = 126/13 RA = 5 RV = 29/8 PA = 33/9 (22) PCW = 12 Fick cardiac output/index = 5.3/3.0 PVR = 1.9 WU Ao sat = 92%  PA sat = 55%,54%  Review of systems complete and found to be negative unless listed in HPI.    Past Medical History:  Diagnosis Date  . AICD (automatic  cardioverter/defibrillator) present   . CAD (coronary artery disease)   . Coronary artery disease    1980's-90's MI with perhaps angioplasty  . Hypertension   . Ischemic cardiomyopathy   . LV (left ventricular) mural thrombus following MI ([facility])   . Myocardial infarction ([facility]) 1999  . Ventricular fibrillation ([facility]) 07/2016       Current Outpatient Medications  Medication Sig Dispense Refill  . carvedilol (COREG) 6.25 MG tablet TAKE 1 TABLET(6.25 MG) BY MOUTH TWICE DAILY WITH A MEAL 180 tablet 3  . ELIQUIS 5 MG TABS tablet TAKE 1 TABLET BY MOUTH TWICE DAILY 60 tablet 5  . eplerenone (INSPRA) 25 MG tablet TAKE 1/2 TABLET(12.5 MG) BY MOUTH DAILY 15 tablet 5  . rosuvastatin (CRESTOR) 10 MG tablet Take 1 tablet (10 mg total) by mouth daily. 30 tablet 5  . sacubitril-valsartan (ENTRESTO) 97-103 MG Take 1 tablet by mouth 2 (two) times daily. 60 tablet 6  . acetaminophen (TYLENOL) 325 MG tablet Take 2 tablets (650 mg total) by mouth every 4 (four) hours as needed for mild pain or headache. (Patient not taking: Reported on 02/26/2018)     No current facility-administered medications for this encounter.    Facility-Administered Medications Ordered in Other Encounters  Medication Dose Route Frequency Provider Last Rate Last Dose  . perflutren lipid microspheres (DEFINITY) IV suspension  1-10 mL Intravenous PRN Bensimhon, Shaune Pascal, MD   2 mL at 02/26/18 1153   Allergies  Allergen Reactions  .  Spironolactone Other (See Comments)    Breast pain--swelling/inflammation    Social History   Socioeconomic History  . Marital status: Married    Spouse name: Not on file  . Number of children: Not on file  . Years of education: Not on file  . Highest education level: Not on file  Occupational History  . Not on file  Social Needs  . Financial resource strain: Not on file  . Food insecurity:    Worry: Not on file    Inability: Not on file  . Transportation needs:    Medical: Not on file     Non-medical: Not on file  Tobacco Use  . Smoking status: Current Every Day Smoker    Packs/day: 1.00    Years: 25.00    Pack years: 25.00    Types: Cigarettes  . Smokeless tobacco: Former Systems developer    Types: Chew  Substance and Sexual Activity  . Alcohol use: Yes    Alcohol/week: 14.0 standard drinks    Types: 14 Glasses of wine per week    Comment: 10/29/2016 "couple glasses of red wine/day"  . Drug use: No  . Sexual activity: Not Currently  Lifestyle  . Physical activity:    Days per week: Not on file    Minutes per session: Not on file  . Stress: Not on file  Relationships  . Social connections:    Talks on phone: Not on file    Gets together: Not on file    Attends religious service: Not on file    Active member of club or organization: Not on file    Attends meetings of clubs or organizations: Not on file    Relationship status: Not on file  . Intimate partner violence:    Fear of current or ex partner: Not on file    Emotionally abused: Not on file    Physically abused: Not on file    Forced sexual activity: Not on file  Other Topics Concern  . Not on file  Social History Narrative   Lives in [place] with spouse    Family History  Problem Relation Age of Onset  . Osteoporosis Mother   . Lung cancer Father   . Other Brother        died in [place]   Vitals:   March 10, 2018 1200  BP: (!) 164/88  Pulse: 75  SpO2: 97%  Weight: 62.4 kg (137 lb 9.6 oz)   Wt Readings from Last 3 Encounters:  03/10/2018 62.4 kg (137 lb 9.6 oz)  10/23/17 61.3 kg (135 lb 3.2 oz)  09/03/17 60 kg (132 lb 3.2 oz)    PHYSICAL EXAM: General:  Well appearing. No resp difficulty HEENT: normal Neck: supple. no JVD. Carotids 2+ bilat; no bruits. No lymphadenopathy or thryomegaly appreciated. Cor: PMI nondisplaced. Regular rate & rhythm. No rubs, gallops or murmurs. Lungs: clear. Mildly decreased BS  Abdomen: soft, nontender, nondistended. No hepatosplenomegaly. No bruits or masses. Good bowel  sounds. Extremities: no cyanosis, clubbing, rash, edema Neuro: alert & orientedx3, cranial nerves grossly intact. moves all 4 extremities w/o difficulty. Affect pleasant   ASSESSMENT & PLAN:  1. CAD with ICM s/p Cardiac Arrest VT/VF:  - Cath 08/13/16 with 3V disease (mostly non-obstructive)  medical therapy. If develops angina can consider PCI to Ramus.  - Doing well. No s/s ischemia.  - No s/s of ischemia.    - OFF stop ASA with With Eliquis 2. Chronic systolic HF.  - Echo 4/58/09 - LVEF  35-40%, grade 1 DD. Apparent medium-sized apical thrombus.  - Echo 12/2016 EF 40-45% No LV thrombus - Echo today EF 40-45% stable Personally reviewed - NYHA I - Volume status stable on exam and ICD interrogation (done personally) - BP elevated in setting of not taking meds this am  - Continue entresto 97/103 mg BID.  - Increase carvedilol 9.375 mg BID.  - Continue eplerenone 12.5 mg daily.  (did not tolerate spiro due to painful gynecomastia) - Reinforced fluid restriction to < 2 L daily, sodium restriction to less than 2000 mg daily, and the importance of daily weights.   - Labs today 3. Perforated stomach s/p repair. - happened in setting of CPR s/p repair.  - Doing well.  4. 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  - Increase carvedilol to 9.375 mg BID.  5. LV Thrombus:  - Resolved on echo 6. ETOH and tobacco abuse.  - No longer drinkingg beer. Drinks 2 glasses of wine nightly. 7. PAF - Regular on exam today.  - Continue Eliquis 5 mg BID.   Glori Bickers, MD 02/26/18

## 2018-02-26 NOTE — Addendum Note (Signed)
Encounter addended by: Valeda Malm, RN on: 02/26/2018 12:48 PM  Actions taken: Order list changed, Diagnosis association updated, Clinical Note Signed

## 2018-03-01 ENCOUNTER — Ambulatory Visit (HOSPITAL_COMMUNITY)
Admission: RE | Admit: 2018-03-01 | Discharge: 2018-03-01 | Disposition: A | Discharge status: Home / Self Care | Payer: PPO | Source: Ambulatory Visit | Attending: Internal Medicine | Admitting: Internal Medicine

## 2018-03-01 ENCOUNTER — Telehealth (HOSPITAL_COMMUNITY): Payer: Self-pay

## 2018-03-01 DIAGNOSIS — I5022 Chronic systolic (congestive) heart failure: Secondary | ICD-10-CM | POA: Diagnosis not present

## 2018-03-01 LAB — BASIC METABOLIC PANEL
Anion gap: 8 (ref 5–15)
BUN: 11 mg/dL (ref 8–23)
CHLORIDE: 95 mmol/L — AB (ref 98–111)
CO2: 25 mmol/L (ref 22–32)
Calcium: 9.1 mg/dL (ref 8.9–10.3)
Creatinine, Ser: 1.24 mg/dL (ref 0.61–1.24)
GFR calc Af Amer: >60 mL/min (ref >60)
GFR calc non Af Amer: 59 mL/min — ABNORMAL LOW (ref >60)
GLUCOSE: 94 mg/dL (ref 70–99)
POTASSIUM: 6.1 mmol/L — AB (ref 3.5–5.1)
Sodium: 128 mmol/L — ABNORMAL LOW (ref 135–145)

## 2018-03-01 NOTE — Telephone Encounter (Signed)
-----   Message from Jolaine Artist, MD sent at 02/26/2018  8:30 PM EST ----- Please repeat BMET on Monday to recheck K

## 2018-03-01 NOTE — Telephone Encounter (Signed)
K 5.6  Pt aware of lab results, K 5.6. Pt has no complaints at present.  Per MD Bensimhon, advised patient to repeat BMET today.  Lab appt made for today 11:30a. Pt appreciative and amendable to plan

## 2018-03-05 ENCOUNTER — Telehealth (HOSPITAL_COMMUNITY): Payer: Self-pay

## 2018-03-05 ENCOUNTER — Other Ambulatory Visit: Payer: Self-pay

## 2018-03-05 ENCOUNTER — Emergency Department (HOSPITAL_COMMUNITY)
Admission: EM | Admit: 2018-03-05 | Discharge: 2018-03-05 | Disposition: A | Discharge status: Home / Self Care | Payer: PPO | Attending: Emergency Medicine | Admitting: Emergency Medicine

## 2018-03-05 ENCOUNTER — Encounter (HOSPITAL_COMMUNITY): Payer: Self-pay

## 2018-03-05 DIAGNOSIS — I5022 Chronic systolic (congestive) heart failure: Secondary | ICD-10-CM | POA: Insufficient documentation

## 2018-03-05 DIAGNOSIS — Z79899 Other long term (current) drug therapy: Secondary | ICD-10-CM | POA: Diagnosis not present

## 2018-03-05 DIAGNOSIS — E875 Hyperkalemia: Secondary | ICD-10-CM | POA: Diagnosis not present

## 2018-03-05 DIAGNOSIS — I110 Hypertensive heart disease with heart failure: Secondary | ICD-10-CM | POA: Insufficient documentation

## 2018-03-05 DIAGNOSIS — F17210 Nicotine dependence, cigarettes, uncomplicated: Secondary | ICD-10-CM | POA: Diagnosis not present

## 2018-03-05 DIAGNOSIS — I10 Essential (primary) hypertension: Secondary | ICD-10-CM | POA: Diagnosis not present

## 2018-03-05 LAB — BASIC METABOLIC PANEL
Anion gap: 11 (ref 5–15)
BUN: 14 mg/dL (ref 8–23)
CHLORIDE: 100 mmol/L (ref 98–111)
CO2: 21 mmol/L — ABNORMAL LOW (ref 22–32)
Calcium: 9.5 mg/dL (ref 8.9–10.3)
Creatinine, Ser: 1.25 mg/dL — ABNORMAL HIGH (ref 0.61–1.24)
GFR calc Af Amer: >60 mL/min (ref >60)
GFR, EST NON AFRICAN AMERICAN: 58 mL/min — AB (ref >60)
Glucose, Bld: 110 mg/dL — ABNORMAL HIGH (ref 70–99)
Potassium: 5.3 mmol/L — ABNORMAL HIGH (ref 3.5–5.1)
SODIUM: 132 mmol/L — AB (ref 135–145)

## 2018-03-05 LAB — MAGNESIUM: Magnesium: 1.7 mg/dL (ref 1.7–2.4)

## 2018-03-05 LAB — CBC
HEMATOCRIT: 42 % (ref 39.0–52.0)
Hemoglobin: 14 g/dL (ref 13.0–17.0)
MCH: 37 pg — ABNORMAL HIGH (ref 26.0–34.0)
MCHC: 33.3 g/dL (ref 30.0–36.0)
MCV: 111.1 fL — ABNORMAL HIGH (ref 80.0–100.0)
Platelets: 167 10*3/uL (ref 150–400)
RBC: 3.78 MIL/uL — ABNORMAL LOW (ref 4.22–5.81)
RDW: 12.8 % (ref 11.5–15.5)
WBC: 7.6 10*3/uL (ref 4.0–10.5)
nRBC: 0 % (ref 0.0–0.2)

## 2018-03-05 MED ORDER — SODIUM ZIRCONIUM CYCLOSILICATE 10 G PO PACK
10.0000 g | PACK | Freq: Once | ORAL | Status: AC
  Administered 2018-03-05: 10 g via ORAL
  Filled 2018-03-05: qty 1

## 2018-03-05 NOTE — ED Provider Notes (Signed)
[facility] DEPARTMENT Provider Note   CSN: [id number] Arrival date & time: 03/05/18  1623    History   Chief Complaint Chief Complaint  Patient presents with  . Abnormal Labs    HPI [name] [name] is a 68 y.o. male.     HPI   68 year old male with past medical history of coronary disease, heart failure, history of V. fib arrest, here with hyperkalemia.  Patient was sent here due to hyperkalemia notified on outside labs.  The patient states he went for recent checkup at his cardiologist, Dr. Haroldine Laws.  He had been feeling very well and was actually cleared for increased length of follow-up.  He states that he has been feeling great.  He was able to walk his dogs without difficulty today.  Denies any cramping.  No nausea or vomiting.  He does state that he has been trying to eat more greens and vegetables recently, with increased spinach intake.  He is been urinating without difficulty.  He states that he recently had his Coreg increased and was told to stop his eplerenone today, but did take it this morning.  No chest pain or shortness of breath.  No palpitations.  No other complaints.  Past Medical History:  Diagnosis Date  . AICD (automatic cardioverter/defibrillator) present   . CAD (coronary artery disease)   . Coronary artery disease    1980's-90's MI with perhaps angioplasty  . Hypertension   . Ischemic cardiomyopathy   . LV (left ventricular) mural thrombus following MI ([place])   . Myocardial infarction ([place]) 1999  . Ventricular fibrillation ([place]) 07/2016        Patient Active Problem List   Diagnosis Date Noted  . ETOH abuse 09/03/2017  . Ventricular fibrillation ([place]) 10/29/2016  . Coronary artery disease involving native coronary artery of native heart without angina pectoris   . Chronic systolic heart failure ([place])   . S/P evacuation of hematoma 08/15/2016  . Perforated gastric ulcer ([place])   . Tachycardia   . Hypotension due to  drugs   . Benign essential HTN   . Labile blood pressure   . Pleural effusion   . Hematoma of chest wall   . Frequent PVCs   . Cardiomyopathy, ischemic   . Chest wall trauma   . Acute respiratory failure ([place])   . Pneumoperitoneum 08/04/2016  . Cardiac arrest ([place])   . Syncope and collapse     Past Surgical History:  Procedure Laterality Date  . CORONARY ANGIOPLASTY  1999   "Dr. Vidal Schwalbe"  . HEMATOMA EVACUATION Left 08/15/2016   Procedure: INSERTION OF DRAIN ANTERIOR LEFT CHEST WALL HEMATOMA;  Surgeon: Ivin Poot, MD;  Location: [facility];  Service: Thoracic;  Laterality: Left;  . ICD IMPLANT N/A 10/29/2016   [employer] VR model [phone] (serial  Number [id number]) ICD for secondary prevention of sudden death after prior VF arrest by Dr Rayann Heman  . LAPAROTOMY N/A 08/04/2016   Procedure: EXPLORATORY LAPAROTOMY FREE AIR BIOPSY OF PERFORATED STOMACH ULCER REPAIR OF PERFORATED STOMACH ULCER WITH A OMENTAL PATCH;  Surgeon: Georganna Skeans, MD;  Location: [facility];  Service: General;  Laterality: N/A;  . RIGHT/LEFT HEART CATH AND CORONARY ANGIOGRAPHY N/A 08/13/2016   Procedure: Right/Left Heart Cath and Coronary Angiography;  Surgeon: Jolaine Artist, MD;  Location: [facility];  Service: Cardiovascular;  Laterality: N/A;        Home Medications    Prior to Admission medications  Medication Sig Start Date End Date Taking? Authorizing Provider  acetaminophen (TYLENOL) 325 MG tablet Take 2 tablets (650 mg total) by mouth every 4 (four) hours as needed for mild pain or headache. 08/19/16  Yes Velvet Bathe, MD  carvedilol (COREG) 6.25 MG tablet Take 1.5 tablets (9.375 mg total) by mouth 2 (two) times daily with a meal. 02/26/18  Yes Bensimhon, Shaune Pascal, MD  ELIQUIS 5 MG TABS tablet TAKE 1 TABLET BY MOUTH TWICE DAILY Patient taking differently: Take 5 mg by mouth 2 (two) times daily.  11/30/17  Yes Bensimhon, Shaune Pascal, MD  rosuvastatin (CRESTOR) 10 MG tablet Take 1 tablet  (10 mg total) by mouth daily. Patient taking differently: Take 10 mg by mouth at bedtime.  09/17/16  Yes Bensimhon, Shaune Pascal, MD  sacubitril-valsartan (ENTRESTO) 97-103 MG Take 1 tablet by mouth 2 (two) times daily. 09/03/17  Yes Bensimhon, Shaune Pascal, MD    Family History Family History  Problem Relation Age of Onset  . Osteoporosis Mother   . Lung cancer Father   . Other Brother        died in [place]    Social History Social History   Tobacco Use  . Smoking status: Current Every Day Smoker    Packs/day: 1.00    Years: 25.00    Pack years: 25.00    Types: Cigarettes  . Smokeless tobacco: Former Systems developer    Types: Chew  Substance Use Topics  . Alcohol use: Yes    Alcohol/week: 14.0 standard drinks    Types: 14 Glasses of wine per week    Comment: 10/29/2016 "couple glasses of red wine/day"  . Drug use: No     Allergies   Spironolactone   Review of Systems Review of Systems  Constitutional: Negative for chills, fatigue and fever.  HENT: Negative for congestion and rhinorrhea.   Eyes: Negative for visual disturbance.  Respiratory: Negative for cough, shortness of breath and wheezing.   Cardiovascular: Negative for chest pain and leg swelling.  Gastrointestinal: Negative for abdominal pain, diarrhea, nausea and vomiting.  Genitourinary: Negative for dysuria and flank pain.  Musculoskeletal: Negative for neck pain and neck stiffness.  Skin: Negative for rash and wound.  Allergic/Immunologic: Negative for immunocompromised state.  Neurological: Negative for syncope, weakness and headaches.  All other systems reviewed and are negative.    Physical Exam Updated Vital Signs BP (!) 142/86   Pulse 73   Temp 97.6 F (36.4 C) (Oral)   Resp (!) 24   Ht 5\' 8"  (1.727 m)   Wt 60.3 kg   SpO2 97%   BMI 20.22 kg/m   Physical Exam Vitals signs and nursing note reviewed.  Constitutional:      General: He is not in acute distress.    Appearance: He is well-developed.  HENT:      Head: Normocephalic and atraumatic.  Eyes:     Conjunctiva/sclera: Conjunctivae normal.  Neck:     Musculoskeletal: Neck supple.  Cardiovascular:     Rate and Rhythm: Normal rate and regular rhythm.     Heart sounds: Normal heart sounds. No murmur. No friction rub.  Pulmonary:     Effort: Pulmonary effort is normal. No respiratory distress.     Breath sounds: Normal breath sounds. No wheezing or rales.  Abdominal:     General: There is no distension.     Palpations: Abdomen is soft.     Tenderness: There is no abdominal tenderness.  Skin:    General: Skin  is warm.     Capillary Refill: Capillary refill takes less than 2 seconds.  Neurological:     Mental Status: He is alert and oriented to person, place, and time.     Motor: No abnormal muscle tone.      ED Treatments / Results  Labs (all labs ordered are listed, but only abnormal results are displayed) Labs Reviewed  BASIC METABOLIC PANEL - Abnormal; Notable for the following components:      Result Value   Sodium 132 (*)    Potassium 5.3 (*)    CO2 21 (*)    Glucose, Bld 110 (*)    Creatinine, Ser 1.25 (*)    GFR calc non Af Amer 58 (*)    All other components within normal limits  CBC - Abnormal; Notable for the following components:   RBC 3.78 (*)    MCV 111.1 (*)    MCH 37.0 (*)    All other components within normal limits  MAGNESIUM    EKG EKG Interpretation  Date/Time:  Friday March 05 2018 16:33:15 EST Ventricular Rate:  69 PR Interval:  148 QRS Duration: 82 QT Interval:  396 QTC Calculation: 424 R Axis:   -78 Text Interpretation:  Normal sinus rhythm Left axis deviation Low voltage QRS Inferior infarct , age undetermined Cannot rule out Anteroseptal infarct , age undetermined T wave abnormality, consider lateral ischemia Abnormal ECG no significant change since 2018 Confirmed by Sherwood Gambler [phone]) on 03/05/2018 4:37:53 PM   Radiology No results found.  Procedures Procedures  (including critical care time)  Medications Ordered in ED Medications  sodium zirconium cyclosilicate (LOKELMA) packet 10 g (10 g Oral Given 03/05/18 2000)     Initial Impression / Assessment and Plan / ED Course  I have reviewed the triage vital signs and the nursing notes.  Pertinent labs & imaging results that were available during my care of the patient were reviewed by me and considered in my medical decision making (see chart for details).  Clinical Course as of Mar 05 2001  Fri Mar 05, 2018  1961 Very pleasant 68 year old male here with mild hyperkalemia noted on outside labs.  This appears to be downtrending in comparison to his recent value at lab.  EKG here shows no changes to suggest hyperkalemia with normal T waves and QRS.  Labs are otherwise reassuring.  I suspect this is a combination of dietary changes in addition to his potassium sparing eplerenone. Discussed with Dr. Harl Bowie of Cardiology. Will have him hold his Eplerenone until OK'ed by Dr. Sung Amabile. Advised he otherwise continue his usual medications. Dose of Lokelma given here to reduce K tonight.   [CI]    Clinical Course User Index [CI] Duffy Bruce, MD        Final Clinical Impressions(s) / ED Diagnoses   Final diagnoses:  Hyperkalemia    ED Discharge Orders    None       Duffy Bruce, MD 03/05/18 2004

## 2018-03-05 NOTE — Telephone Encounter (Signed)
Last K.6.1  Pt advised of lab results. Advised to go to Emergency Room to have labs redone and addressed if need. Pt amendable and will go to ER

## 2018-03-05 NOTE — Telephone Encounter (Signed)
-----   Message from Jolaine Artist, MD sent at 03/05/2018  1:48 PM EST ----- K 6.1. Was this addressed. If not, needs to be checked today or go to ER. Stop eplerenone

## 2018-03-05 NOTE — ED Triage Notes (Signed)
Pt sent here from PCP due to potassium level 6.1. pt denies any complaints, no pain, no weakness. Pt a.o, nad noted

## 2018-03-05 NOTE — Discharge Instructions (Addendum)
Continue to HOLD your Eplerenone (Inspra) until told to resume by Dr. Haroldine Laws  Avoid foods high in potassium (see sheet provided)

## 2018-03-12 ENCOUNTER — Ambulatory Visit (HOSPITAL_COMMUNITY)
Admission: RE | Admit: 2018-03-12 | Discharge: 2018-03-12 | Disposition: A | Discharge status: Home / Self Care | Payer: PPO | Source: Ambulatory Visit | Attending: Cardiology | Admitting: Cardiology

## 2018-03-12 DIAGNOSIS — I5022 Chronic systolic (congestive) heart failure: Secondary | ICD-10-CM | POA: Diagnosis not present

## 2018-03-12 LAB — BASIC METABOLIC PANEL
Anion gap: 8 (ref 5–15)
BUN: 6 mg/dL — AB (ref 8–23)
CO2: 25 mmol/L (ref 22–32)
Calcium: 8.8 mg/dL — ABNORMAL LOW (ref 8.9–10.3)
Chloride: 98 mmol/L (ref 98–111)
Creatinine, Ser: 0.96 mg/dL (ref 0.61–1.24)
GFR calc Af Amer: >60 mL/min (ref >60)
GFR calc non Af Amer: >60 mL/min (ref >60)
Glucose, Bld: 95 mg/dL (ref 70–99)
POTASSIUM: 4.7 mmol/L (ref 3.5–5.1)
Sodium: 131 mmol/L — ABNORMAL LOW (ref 135–145)

## 2018-03-16 ENCOUNTER — Telehealth: Payer: Self-pay

## 2018-03-16 ENCOUNTER — Ambulatory Visit (INDEPENDENT_AMBULATORY_CARE_PROVIDER_SITE_OTHER): Payer: PPO

## 2018-03-16 DIAGNOSIS — Z95810 Presence of automatic (implantable) cardiac defibrillator: Secondary | ICD-10-CM | POA: Diagnosis not present

## 2018-03-16 DIAGNOSIS — I5022 Chronic systolic (congestive) heart failure: Secondary | ICD-10-CM

## 2018-03-16 NOTE — Progress Notes (Signed)
EPIC Encounter for ICM Monitoring  Patient Name: [name] [name] is a 68 y.o. male Date: 03/16/2018 Primary Care Physican: [place] [place] At Primary Cardiologist:Bensimhon Electrophysiologist:Allred Last Weight:134lbs Today's Weight: 135 lbs   Heart failure questions reviewed and pt asymptomatic. He reports no dietary changes in the last 10 days and limits salt and fluid intake.  He rarely eats at restaurants.  Patient had ER visit due to lab results at Dr Bensimhon's visit 03/01/2018 showed Potassium was 6.1. It was thought this was related to increase dietary green leafy vegetables and at the time was taking eplerenone.  Thoracic impedance abnormal suggesting fluid accumulation since 03/06/2018.  No diuretic prescribed.  Impedance has not been this far below baseline in the last year.   Labs: 03/12/2018 Creatinine 0.96, BUN 6,   Potassium 4.7, Sodium 131, GFR >60 03/05/2018 Creatinine 1.25, BUN 14, Potassium 5.3, Sodium 132, GFR 58->60  03/01/2018 Creatinine 1.24, BUN 11, Potassium 6.1, Sodium 128, GFR 59->60  02/26/2018 Creatinine 1.06, BUN 10, Potassium 5.6, Sodium 131, GFR >60  09/11/2017 Creatinine0.87, BUN7,   Potassium4.6, Sodium134, GFR>60 09/03/2017 Creatinine0.89, BUN8,   Potassium4.9, Sodium135, GFR>60 A complete set of results can be found in Results Review.  Recommendations: Advised to review food labels to make sure he limits salt to 2000 mg daily and fluid intake to 64 oz a day.  Advised will send phone note to Dr Haroldine Laws for review and recommendations if needed.  Follow-up plan: ICM clinic phone appointment on3/11/2018 to recheck fluid levels.   Copy of ICM check sent to Dr.Allred and Dr Haroldine Laws for review and recommendations.  3 month ICM trend: 03/16/2018    1 Year ICM trend:       Rosalene Billings, RN 03/16/2018 10:55 AM

## 2018-03-16 NOTE — Telephone Encounter (Signed)
Spoke with patient for monthly ICM follow up.  Advised of remote transmission results.  Patient reports he limits salt intake and eats mainly fresh vegetables.  He does not eat processed foods.  He reports fluid intake is below 64 oz a day.   SYMPTOMS: None  CORVUE REPORT: See cc'd ICM chart note for details.  Thoracic impedance suggesting fluid accumulation since 03/06/2018.                 03/16/2018          PRESCRIBED: No diuretic.   RECOMMENDATIONS:  Advised patient would send information to Dr Haroldine Laws for review and if any recommendations will call back.    Recheck fluid levels and remote transmission scheduled 03/24/2018.  Please advise if any changes are recommended. Will call patient back if changes are advised.

## 2018-03-17 NOTE — Telephone Encounter (Signed)
Would ask him to take some extra diuretic for 2 days. thanks

## 2018-03-18 ENCOUNTER — Other Ambulatory Visit (HOSPITAL_COMMUNITY): Payer: Self-pay | Admitting: Internal Medicine

## 2018-03-19 NOTE — Telephone Encounter (Signed)
Dr Haroldine Laws, patient is not prescribed a diuretic.

## 2018-03-20 NOTE — Telephone Encounter (Signed)
Can we give him lasix 40mg  tablets to use prn for swelling and/or weight gain. Also take with kcl 20 when he takes lasix. thanks

## 2018-03-22 MED ORDER — POTASSIUM CHLORIDE CRYS ER 20 MEQ PO TBCR: EXTENDED_RELEASE_TABLET | ORAL | 3 refills | Status: DC

## 2018-03-22 MED ORDER — FUROSEMIDE 40 MG PO TABS: ORAL_TABLET | ORAL | 3 refills | Status: DC

## 2018-03-22 NOTE — Telephone Encounter (Signed)
Call to patient. Advised Dr Haroldine Laws ordered Furosemide 40 mg 1 tablet as needed for swelling and/or weight gain as needed and also should take Potassium 20 mEq 1 tablet daily when taking Furosemide as needed.  Advised if he has any side effects such as dizziness, lightheadedness, or feeling faint after 1st dosage to call the office for further instructions.   Patient asking if at some point he will resume taking Eplerenone.  He said it was stopped by ER physician on 03/05/2018 due to Hyperkalemia.  Advised would send Dr Haroldine Laws message to ask his question.

## 2018-03-24 ENCOUNTER — Ambulatory Visit (INDEPENDENT_AMBULATORY_CARE_PROVIDER_SITE_OTHER): Payer: PPO

## 2018-03-24 ENCOUNTER — Telehealth: Payer: Self-pay

## 2018-03-24 DIAGNOSIS — Z95810 Presence of automatic (implantable) cardiac defibrillator: Secondary | ICD-10-CM

## 2018-03-24 DIAGNOSIS — I5022 Chronic systolic (congestive) heart failure: Secondary | ICD-10-CM

## 2018-03-24 NOTE — Progress Notes (Signed)
EPIC Encounter for ICM Monitoring  Patient Name: [name] [name] is a 68 y.o. male Date: 03/24/2018 Primary Care Physican: [place] [place] At Primary Cardiologist:Bensimhon Electrophysiologist:Allred Last Weight:135lbs Today's Weight: unknown   Attempted call to patient and unable to reach.  Transmission reviewed.   Thoracic impedance returned to normal after taking newly prescribed Furosemide PRN.  Prescribed: Furosemide 40 mg take 1 tablet PRN for swelling or weight gain.  Potassium 20 mEq take 1 tablet PRN when taking Furosemide.   Labs: 03/12/2018 Creatinine 0.96, BUN 6,   Potassium 4.7, Sodium 131, GFR >60 03/05/2018 Creatinine 1.25, BUN 14, Potassium 5.3, Sodium 132, GFR 58->60  03/01/2018 Creatinine 1.24, BUN 11, Potassium 6.1, Sodium 128, GFR 59->60  02/26/2018 Creatinine 1.06, BUN 10, Potassium 5.6, Sodium 131, GFR >60  09/11/2017 Creatinine0.87, BUN7,   Potassium4.6, Sodium134, GFR>60 09/03/2017 Creatinine0.89, BUN8,   Potassium4.9, Sodium135, GFR>60 A complete set of results can be found in Results Review.  Recommendations: Unable to reach.  Follow-up plan: ICM clinic phone appointment on4/06/2018.  Copy of ICM check sent to Dr.Allred and Dr Haroldine Laws (show effectiveness of PRN Furosemide).  3 month ICM trend: 03/24/2018    1 Year ICM trend:       Rosalene Billings, RN 03/24/2018 2:16 PM

## 2018-03-24 NOTE — Telephone Encounter (Signed)
Remote ICM transmission received.  Attempted call to patient regarding ICM remote transmission and mail box is full, unable to leave message. 

## 2018-04-19 ENCOUNTER — Other Ambulatory Visit: Payer: Self-pay

## 2018-04-19 ENCOUNTER — Ambulatory Visit (INDEPENDENT_AMBULATORY_CARE_PROVIDER_SITE_OTHER): Payer: PPO

## 2018-04-19 DIAGNOSIS — M216X2 Other acquired deformities of left foot: Secondary | ICD-10-CM | POA: Diagnosis not present

## 2018-04-19 DIAGNOSIS — B351 Tinea unguium: Secondary | ICD-10-CM | POA: Diagnosis not present

## 2018-04-19 DIAGNOSIS — Z95810 Presence of automatic (implantable) cardiac defibrillator: Secondary | ICD-10-CM

## 2018-04-19 DIAGNOSIS — I5022 Chronic systolic (congestive) heart failure: Secondary | ICD-10-CM | POA: Diagnosis not present

## 2018-04-19 DIAGNOSIS — M79675 Pain in left toe(s): Secondary | ICD-10-CM | POA: Diagnosis not present

## 2018-04-19 DIAGNOSIS — M216X1 Other acquired deformities of right foot: Secondary | ICD-10-CM | POA: Diagnosis not present

## 2018-04-19 DIAGNOSIS — L600 Ingrowing nail: Secondary | ICD-10-CM | POA: Diagnosis not present

## 2018-04-19 DIAGNOSIS — D2371 Other benign neoplasm of skin of right lower limb, including hip: Secondary | ICD-10-CM | POA: Diagnosis not present

## 2018-04-19 NOTE — Progress Notes (Signed)
EPIC Encounter for ICM Monitoring  Patient Name: CEASAR DECANDIA is a 68 y.o. male Date: 04/19/2018 Primary Care Physican: [place] [place] At Primary Cardiologist:Bensimhon Electrophysiologist:Allred Last Weight:135lbs 04/19/2018 Weight: 135 lbs   Heart failure questions reviewed and he is asymptomatic.  Thoracic impedancenormalbut was suggesting fluid accumulation from 3/26 - 4/4.  Prescribed: Furosemide 40 mg take 1 tablet PRN for swelling or weight gain.  Potassium 20 mEq take 1 tablet PRN when taking Furosemide.   Labs: 03/12/2018 Creatinine0.96, BUN6, Potassium 4.7, Sodium131, GFR>60 03/05/2018 Creatinine1.25, BUN14, Potassium5.3, Sodium132, GFR58->60  03/01/2018 Creatinine1.24, BUN11, Potassium6.1, [id number], GFR59->60  02/26/2018 Creatinine1.06, BUN10, Potassium5.6, Sodium131, GFR>60 09/11/2017 Creatinine0.87, BUN7, Potassium4.6, Sodium134, GFR>60 09/03/2017 Creatinine0.89, BUN8, Potassium4.9, Sodium135, GFR>60 A complete set of results can be found in Results Review.  Recommendations: Encouraged to call for fluid symptoms.  Follow-up plan: ICM clinic phone appointment on5/11/2018.  Copy of ICM check sent to Dr.Allred  3 month ICM trend: 04/19/2018    1 Year ICM trend:       Rosalene Billings, RN 04/19/2018 2:13 PM

## 2018-04-23 IMAGING — DX DG HUMERUS 2V *L*
2 series · 2 of 2 positions shown · non-contrast
Comparison: None in PACs

CLINICAL DATA: Two days of pain and swelling over the left numb
status post fall.

EXAM:
LEFT HUMERUS - 2+ VIEW

[humerus ap]
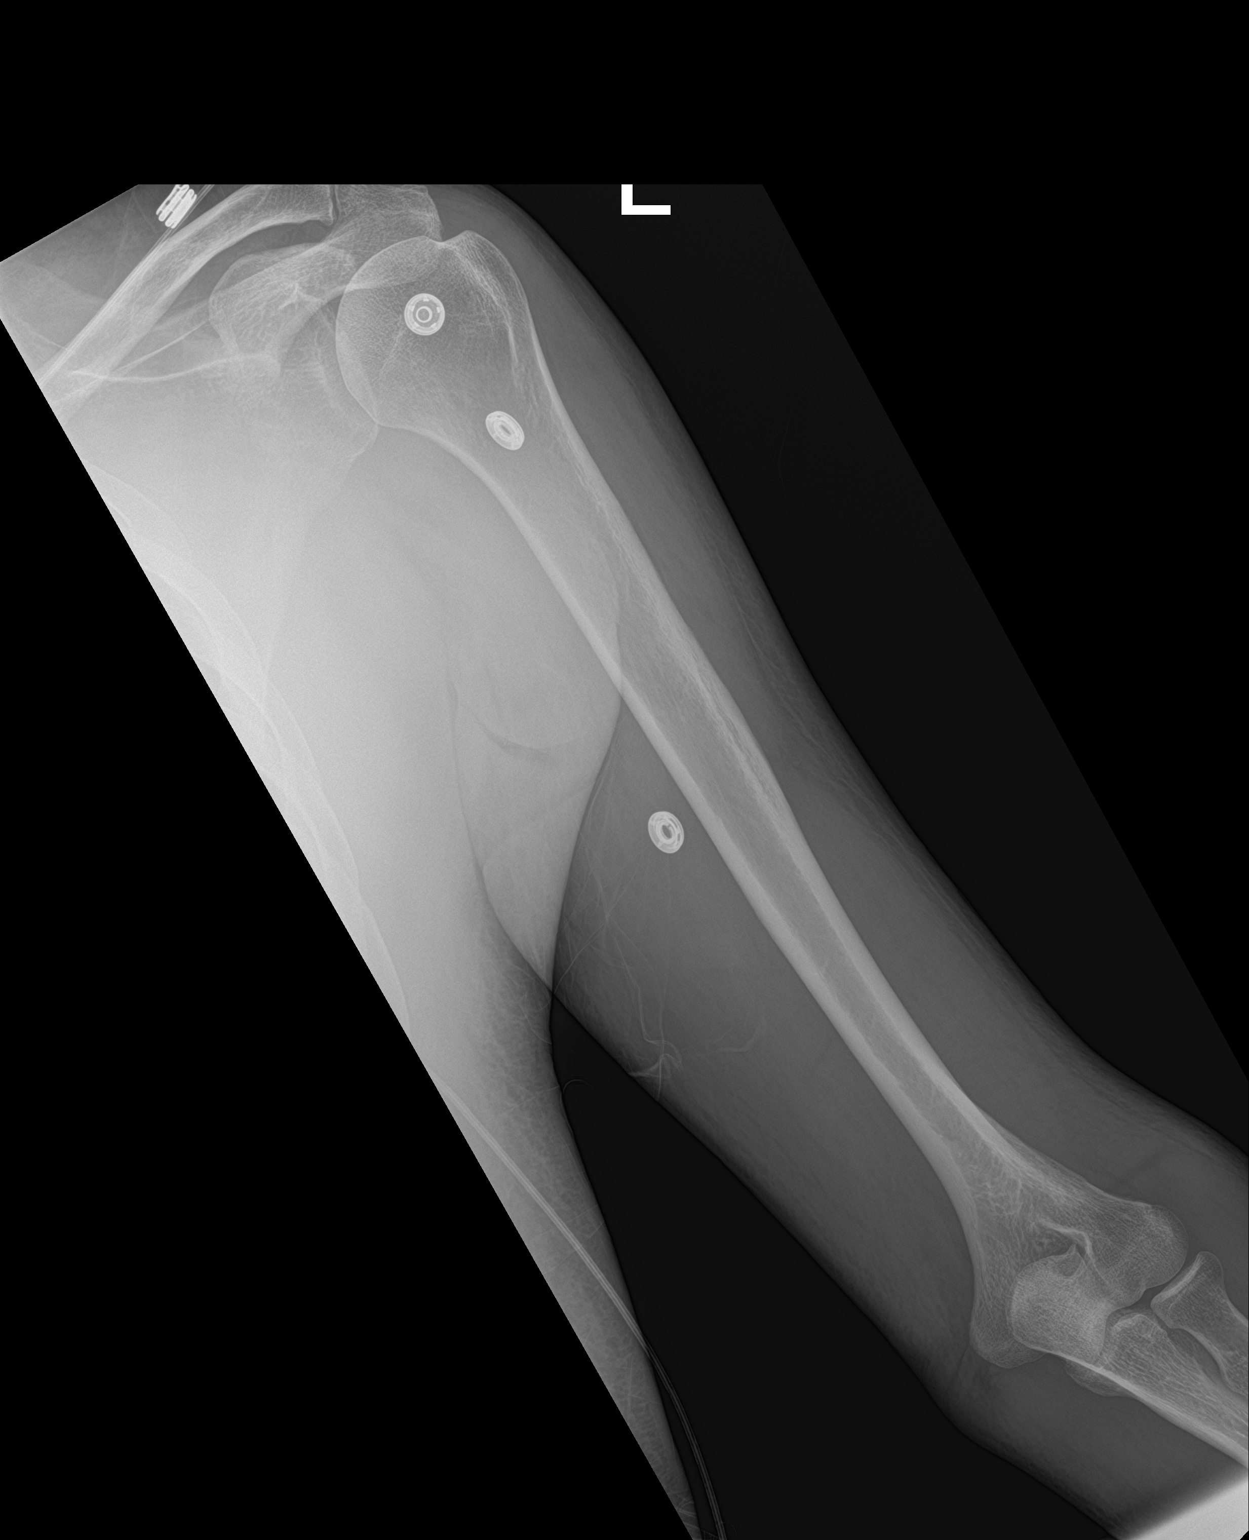

[humerus lat]
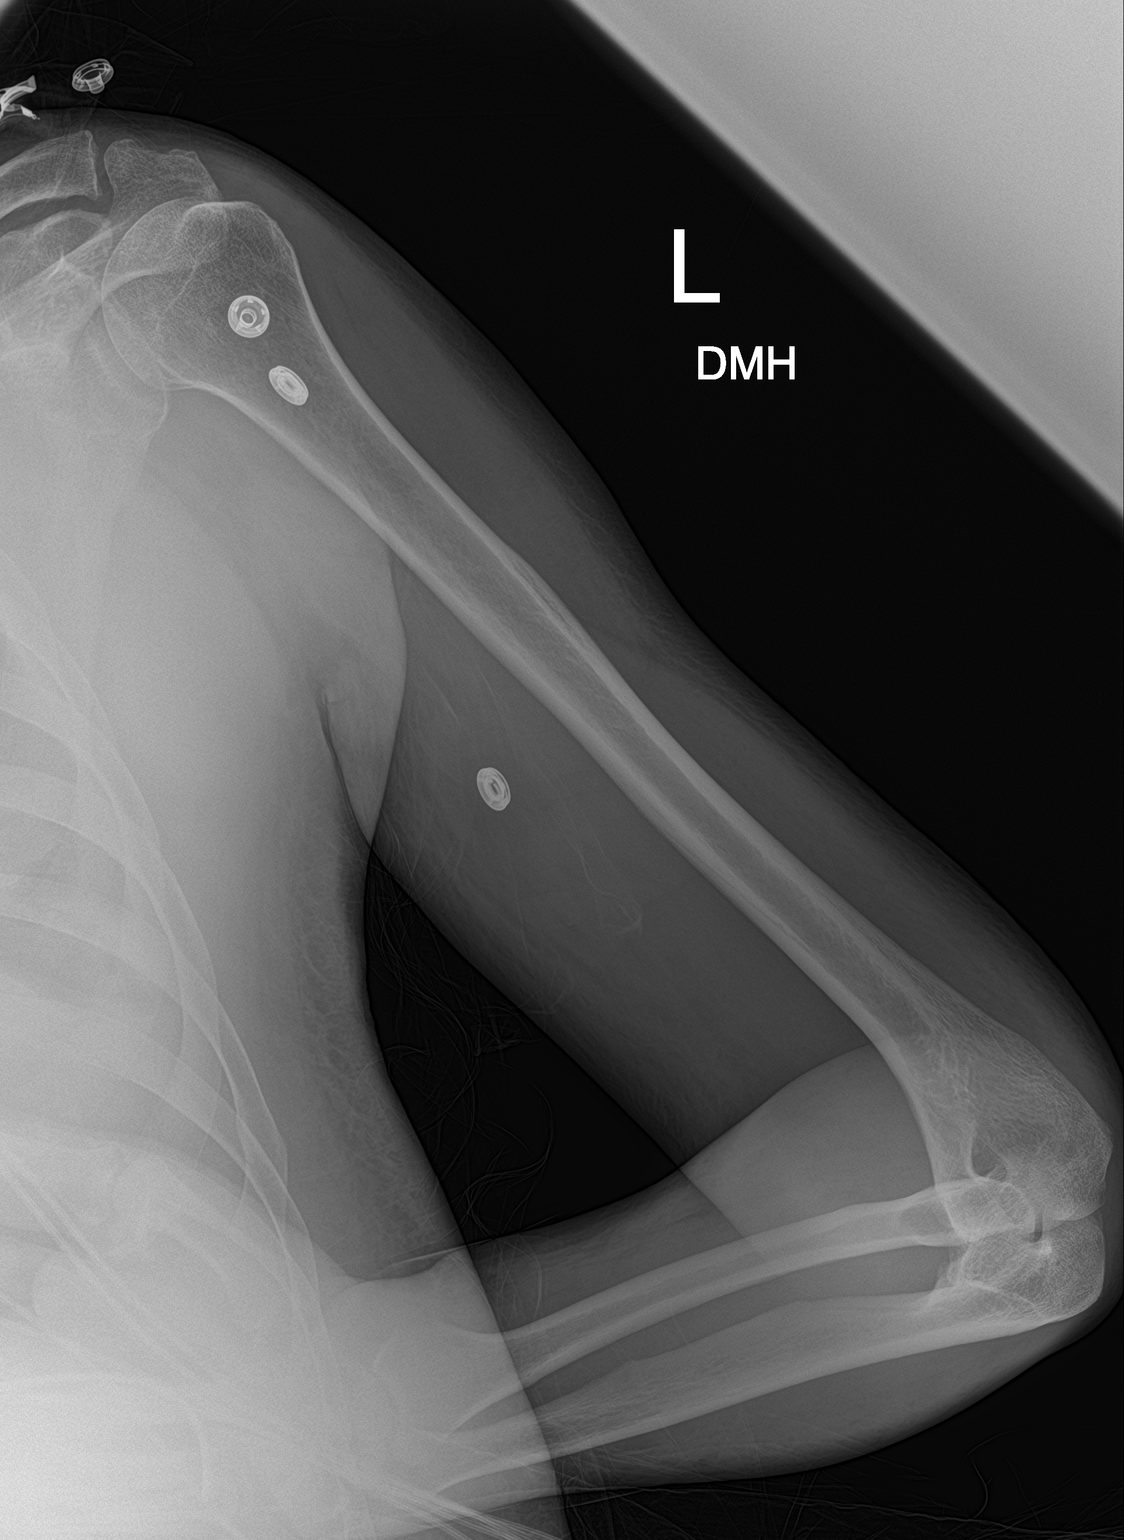

[2 of 2 positions shown; findings below may reference images not displayed]

FINDINGS: The humerus is subjectively adequately mineralized. There is no
acute fracture. The observed portions of the shoulder and elbow
appear normal. The soft tissues of the arm exhibit no acute
abnormalities.
IMPRESSION: There is no acute or significant chronic bony abnormality of the
left humerus.

## 2018-04-27 IMAGING — DX DG CHEST 1V PORT
1 series · 1 of 1 positions shown · non-contrast
Comparison: Chest CT dated 08/08/2016 and portable chest dated
08/08/2016.

CLINICAL DATA: Status post trabeculation of a left chest wall
hematoma.

EXAM:
PORTABLE CHEST 1 VIEW

[chest ap]
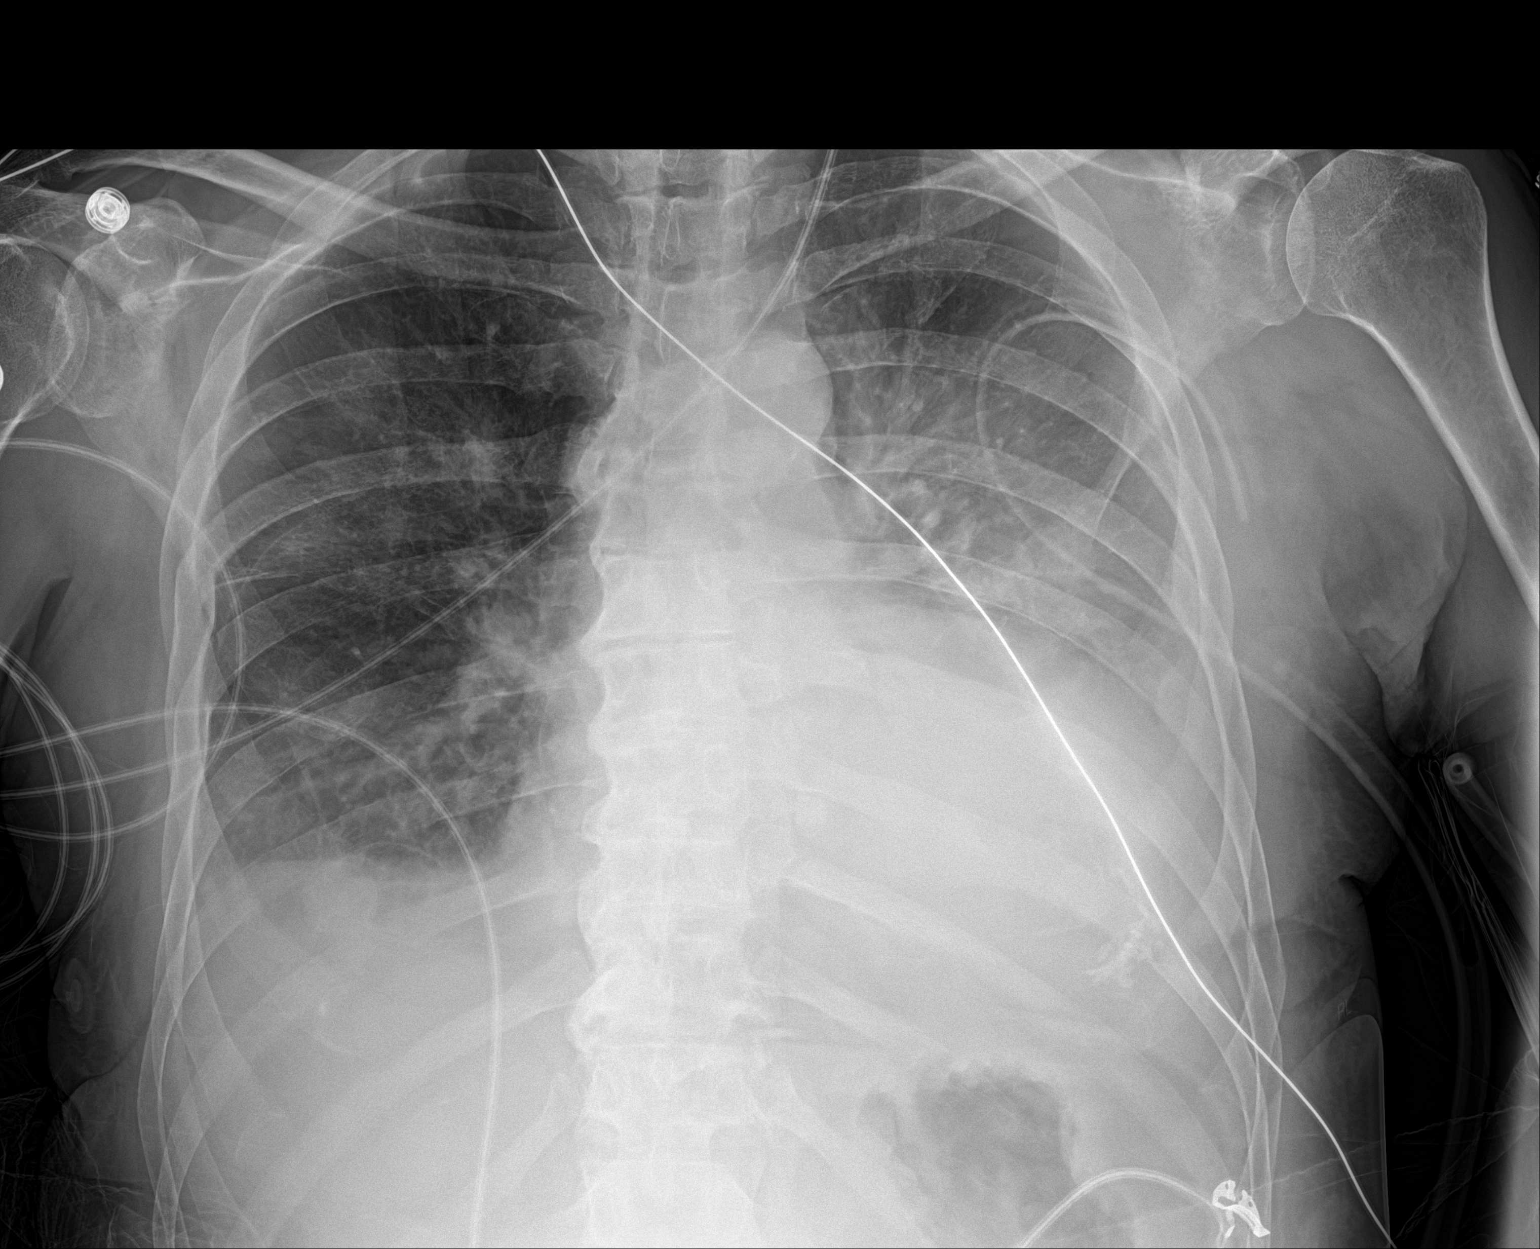

[1 of 1 positions shown; findings below may reference images not displayed]

FINDINGS: Stable enlarged cardiac silhouette. Increased dense opacity in the
left lower lung zone and interval ill-defined opacity in the left
perihilar region. Interval mild right basilar ill-defined opacity.
Decreased inspiration. Tubing overlying the left chest. Thoracic
spine degenerative changes.
IMPRESSION: 1. Increased dense atelectasis and possible pneumonia in the left
lower lung zone with interval left perihilar pneumonia or patchy
atelectasis.
2. Interval mild right basilar atelectasis or pneumonia.

## 2018-05-12 ENCOUNTER — Other Ambulatory Visit (HOSPITAL_COMMUNITY): Payer: Self-pay | Admitting: Internal Medicine

## 2018-05-13 ENCOUNTER — Ambulatory Visit (INDEPENDENT_AMBULATORY_CARE_PROVIDER_SITE_OTHER): Payer: PPO | Admitting: *Deleted

## 2018-05-13 ENCOUNTER — Other Ambulatory Visit: Payer: Self-pay

## 2018-05-13 DIAGNOSIS — I5022 Chronic systolic (congestive) heart failure: Secondary | ICD-10-CM

## 2018-05-13 DIAGNOSIS — I469 Cardiac arrest, cause unspecified: Secondary | ICD-10-CM

## 2018-05-13 LAB — CUP PACEART REMOTE DEVICE CHECK
Battery Remaining Longevity: 87 mo
Battery Remaining Percentage: 85 %
Brady Statistic RV Percent Paced: 1 %
Date Time Interrogation Session: 20200430135558
HighPow Impedance: 60 Ohm
Implantable Lead Implant Date: 20181017
Implantable Lead Location: 753860
Implantable Pulse Generator Implant Date: 20181017
Lead Channel Impedance Value: 460 Ohm
Lead Channel Sensing Intrinsic Amplitude: 12 mV
Lead Channel Setting Pacing Amplitude: 2.5 V
Lead Channel Setting Pacing Pulse Width: 0.5 ms
Lead Channel Setting Sensing Sensitivity: 0.5 mV
Pulse Gen Serial Number: 9781512

## 2018-05-24 ENCOUNTER — Other Ambulatory Visit: Payer: Self-pay

## 2018-05-24 ENCOUNTER — Ambulatory Visit (INDEPENDENT_AMBULATORY_CARE_PROVIDER_SITE_OTHER): Payer: PPO

## 2018-05-24 ENCOUNTER — Telehealth: Payer: Self-pay

## 2018-05-24 DIAGNOSIS — I5022 Chronic systolic (congestive) heart failure: Secondary | ICD-10-CM | POA: Diagnosis not present

## 2018-05-24 DIAGNOSIS — Z95810 Presence of automatic (implantable) cardiac defibrillator: Secondary | ICD-10-CM | POA: Diagnosis not present

## 2018-05-24 NOTE — Progress Notes (Signed)
Remote ICD transmission.   

## 2018-05-24 NOTE — Telephone Encounter (Signed)
Left message for patient to remind of missed remote transmission.  

## 2018-05-26 NOTE — Progress Notes (Signed)
EPIC Encounter for ICM Monitoring  Patient Name: [name] [name] is a 68 y.o. male Date: 05/26/2018 Primary Care Physican: [place], [place] At Primary Cardiologist:Bensimhon Electrophysiologist:Allred 04/19/2018 Weight:135 lbs 05/26/2018 Weight: 131 lbs   Heart failure questions reviewed and he is asymptomatic.  CorVue Thoracic impedancenormal.  Prescribed: Furosemide 40 mg take 1 tablet PRN for swelling or weight gain. Potassium 20 mEq take 1 tablet PRN when taking Furosemide.   Labs: 03/12/2018 Creatinine0.96, BUN6, Potassium 4.7, Sodium131, GFR>60 03/05/2018 Creatinine1.25, BUN14, Potassium5.3, Sodium132, GFR58->60  03/01/2018 Creatinine1.24, BUN11, Potassium6.1, [id number], GFR59->60  02/26/2018 Creatinine1.06, BUN10, Potassium5.6, Sodium131, GFR>60 09/11/2017 Creatinine0.87, BUN7, Potassium4.6, Sodium134, GFR>60 09/03/2017 Creatinine0.89, BUN8, Potassium4.9, Sodium135, GFR>60 A complete set of results can be found in Results Review.  Recommendations: Encouraged to call for fluid symptoms.  Follow-up plan: ICM clinic phone appointment on6/15/2020.  Copy of ICM check sent to Dr.Allred   3 month ICM trend: 05/24/2018    1 Year ICM trend:       Rosalene Billings, RN 05/26/2018 8:14 AM

## 2018-06-28 ENCOUNTER — Ambulatory Visit (INDEPENDENT_AMBULATORY_CARE_PROVIDER_SITE_OTHER): Payer: PPO

## 2018-06-28 DIAGNOSIS — I5022 Chronic systolic (congestive) heart failure: Secondary | ICD-10-CM

## 2018-06-28 DIAGNOSIS — Z95810 Presence of automatic (implantable) cardiac defibrillator: Secondary | ICD-10-CM

## 2018-06-30 NOTE — Progress Notes (Signed)
EPIC Encounter for ICM Monitoring  Patient Name: [name] [name] is a 68 y.o. male Date: 06/30/2018 Primary Care Physican: [place] [place] At Primary Cardiologist:Bensimhon Electrophysiologist:Allred 4/6/2020Weight:135 lbs 05/26/2018 Weight: 131 lbs 06/30/2018 Weight: 132 lbs   Heart failure questions reviewed and he is asymptomatic.  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   CorVue Thoracic impedancenormal.  Prescribed: Furosemide 40 mg take 1 tablet PRN for swelling or weight gain. Potassium 20 mEq take 1 tablet PRN when taking Furosemide.   Labs: 03/12/2018 Creatinine0.96, BUN6, Potassium 4.7, Sodium131, GFR>60 03/05/2018 Creatinine1.25, BUN14, Potassium5.3, Sodium132, GFR58->60  03/01/2018 Creatinine1.24, BUN11, Potassium6.1, [id number], GFR59->60  02/26/2018 Creatinine1.06, BUN10, Potassium5.6, Sodium131, GFR>60 09/11/2017 Creatinine0.87, BUN7, Potassium4.6, Sodium134, GFR>60 09/03/2017 Creatinine0.89, BUN8, Potassium4.9, Sodium135, GFR>60 A complete set of results can be found in Results Review.  Recommendations: Encouraged to call for fluid symptoms.  Follow-up plan: ICM clinic phone appointment on7/29/2020.  Copy of ICM check sent to Dr.Allred   3 month ICM trend: 06/28/2018    1 Year ICM trend:       Rosalene Billings, RN 06/30/2018 7:57 AM

## 2018-07-12 IMAGING — CR DG CHEST 2V
2 series · 2 of 2 positions shown · non-contrast
Comparison: 10/29/2016

CLINICAL DATA: Post defibrillator.  Soreness.

EXAM:
CHEST  2 VIEW

[chest pa]
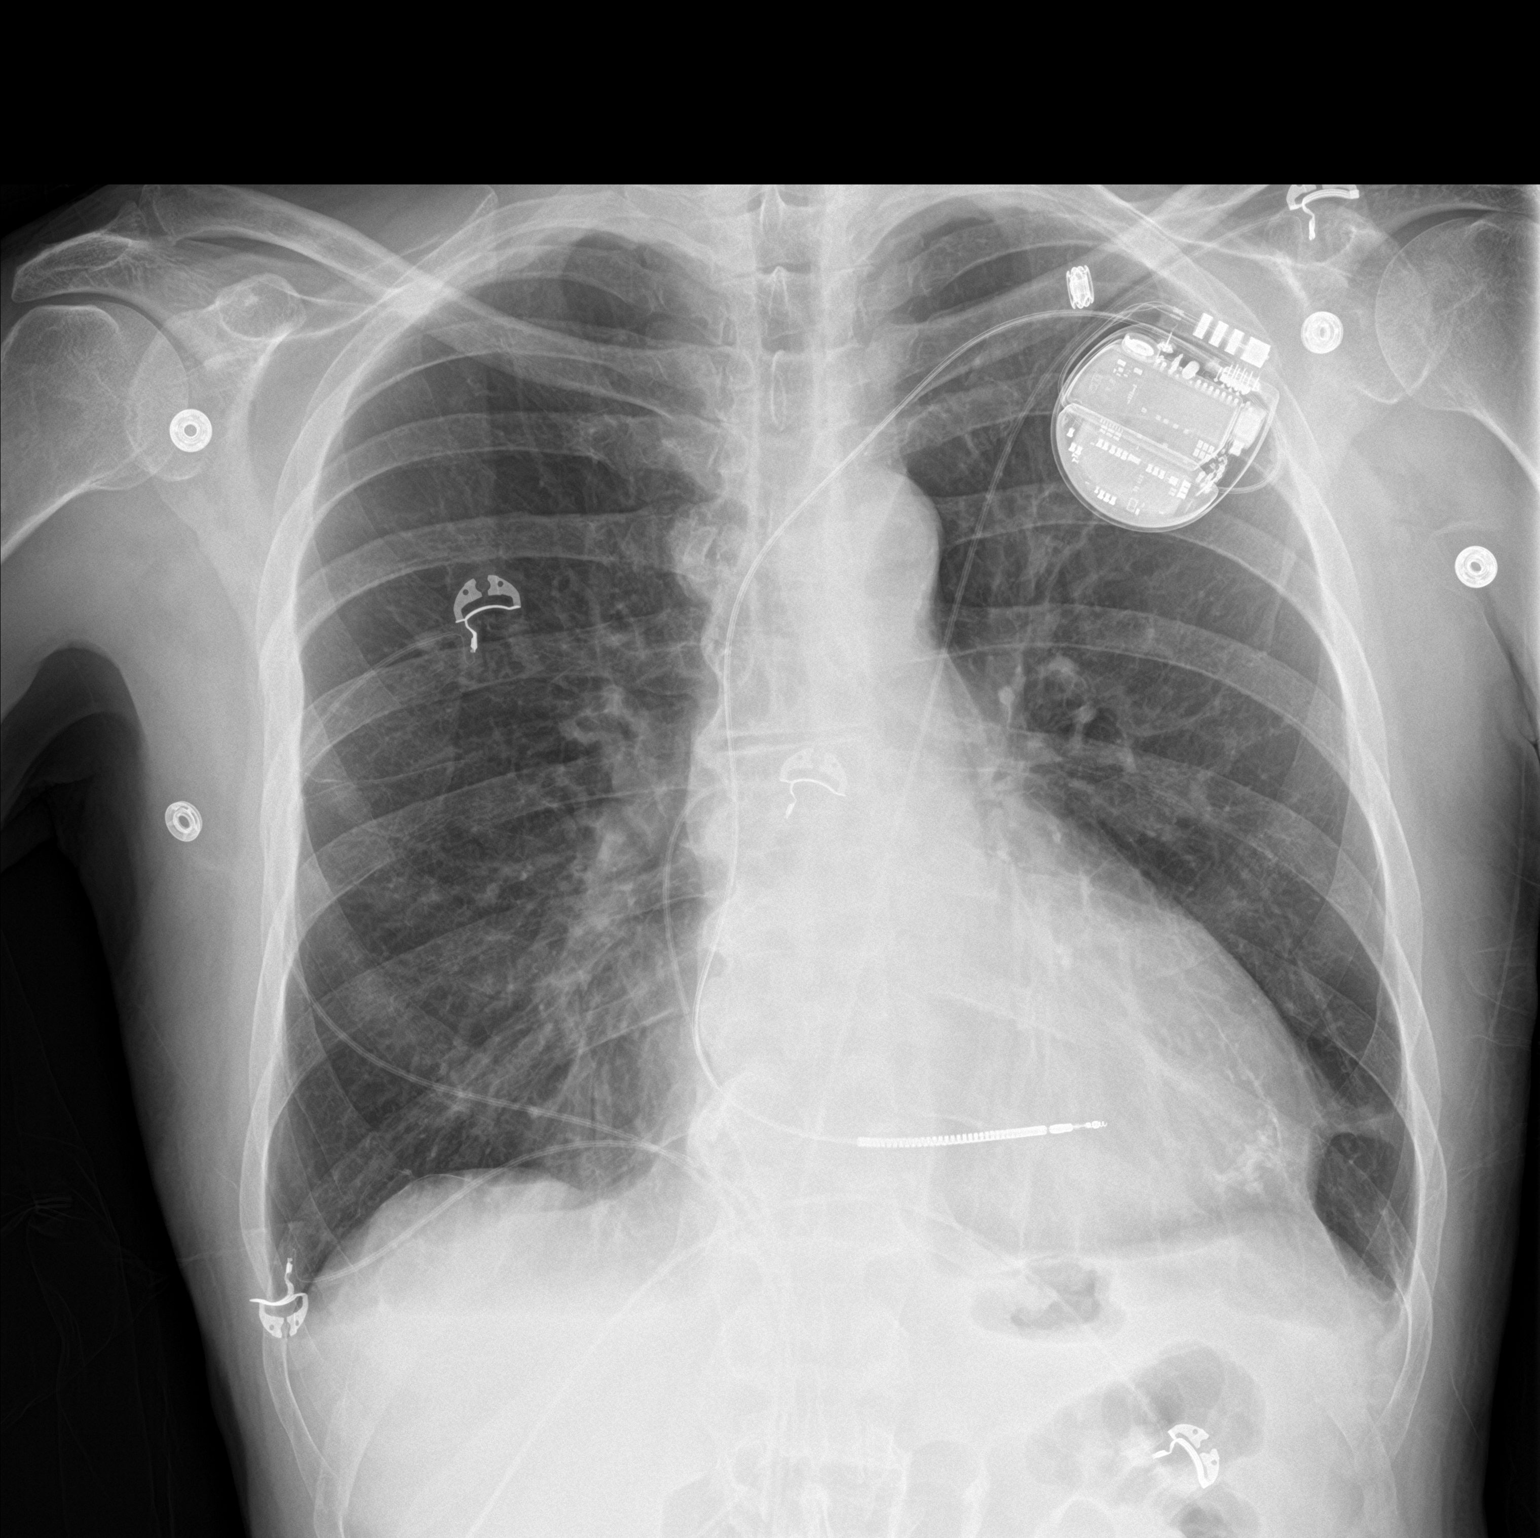

[chest lat]
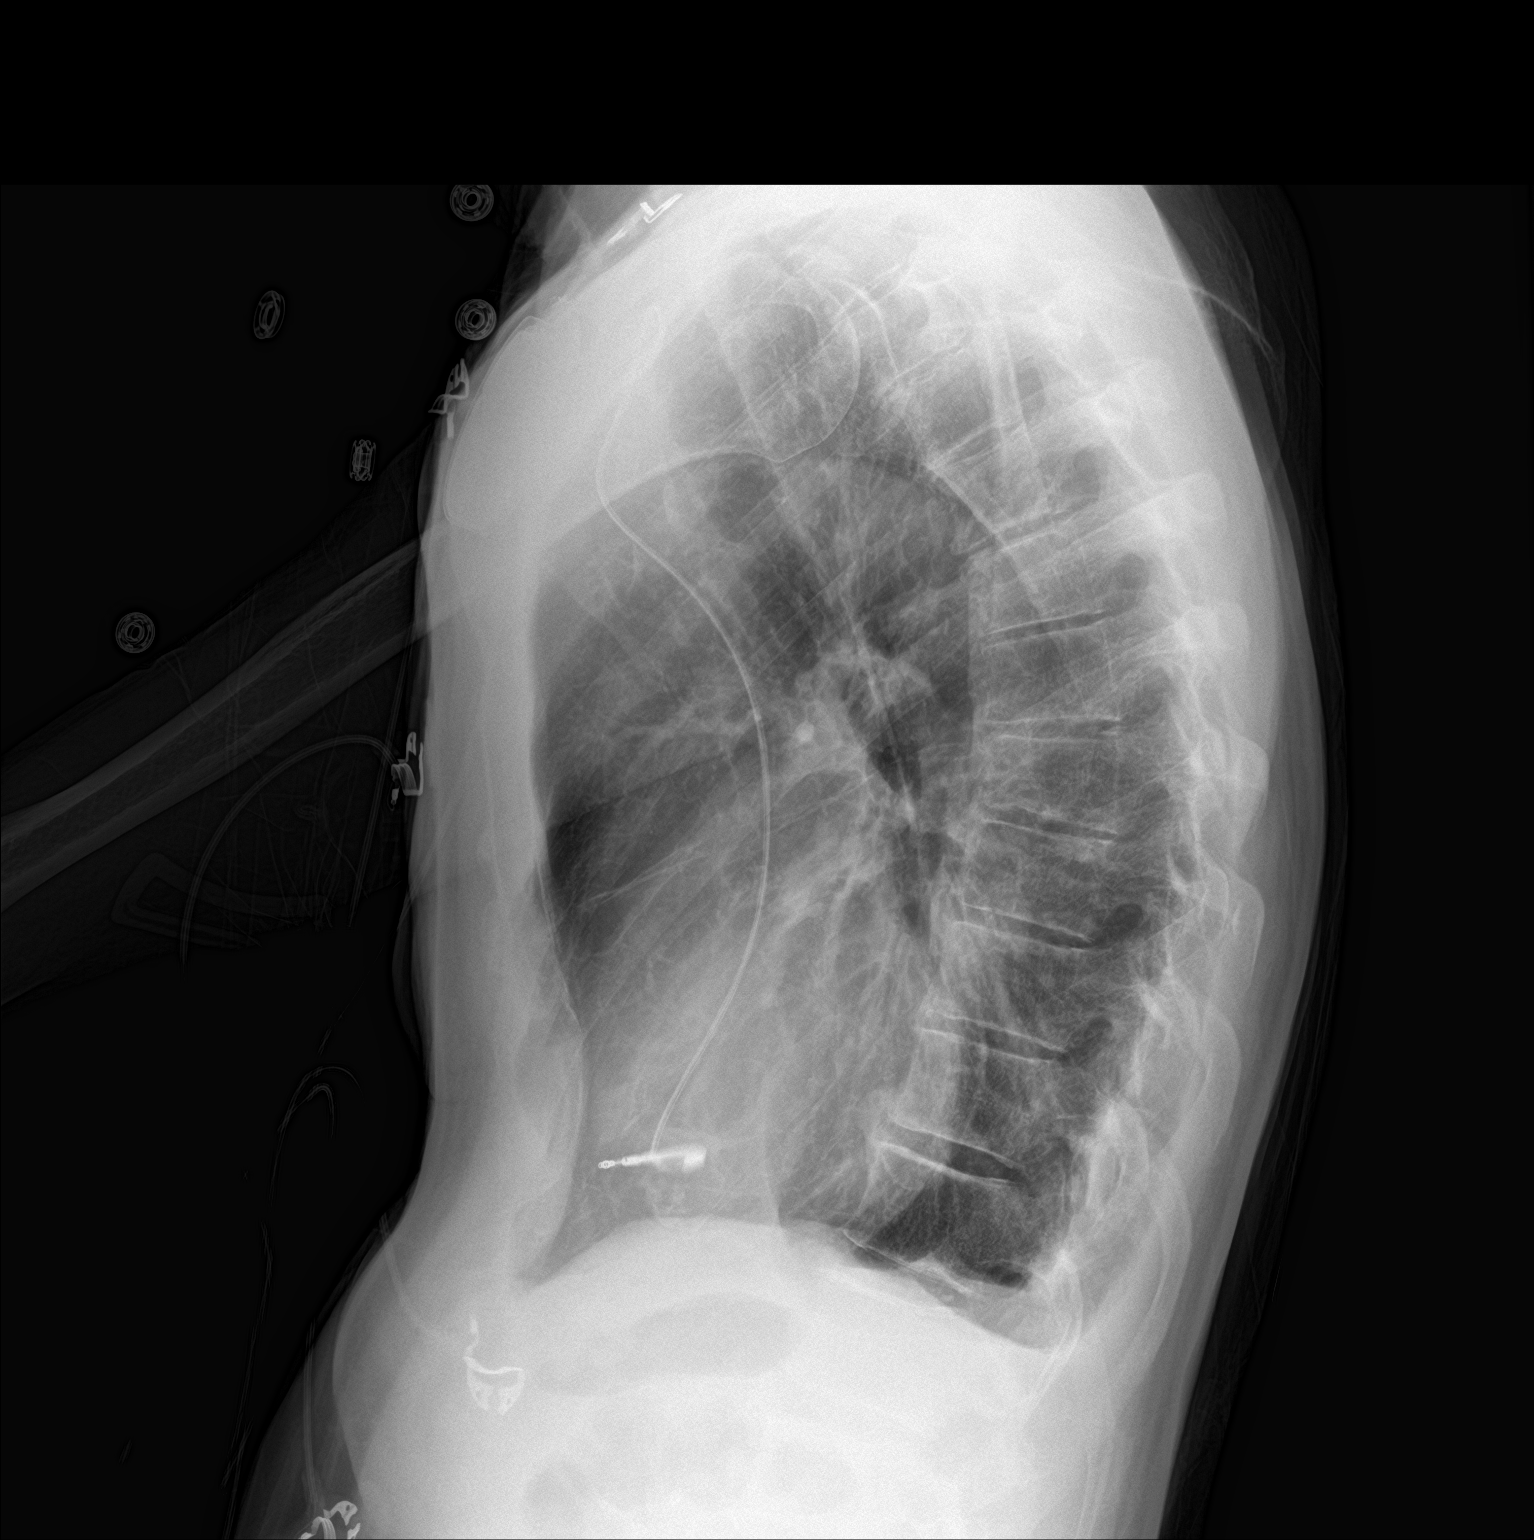

[2 of 2 positions shown; findings below may reference images not displayed]

FINDINGS: Cardiac pacemaker. Lead appears unchanged in position. Borderline
heart size with normal pulmonary vascularity. Mild hyperinflation
may indicate emphysematous change. Scarring and calcification in the
left lung base. No airspace disease or consolidation. No blunting of
costophrenic angles. No pneumothorax. Mild pectus excavatum.
IMPRESSION: Cardiac pacemaker. Probable emphysematous changes in the lungs.
Scarring in the left lung base. No evidence of active pulmonary
disease.

## 2018-08-11 ENCOUNTER — Ambulatory Visit (INDEPENDENT_AMBULATORY_CARE_PROVIDER_SITE_OTHER): Payer: PPO

## 2018-08-11 DIAGNOSIS — Z95810 Presence of automatic (implantable) cardiac defibrillator: Secondary | ICD-10-CM | POA: Diagnosis not present

## 2018-08-11 DIAGNOSIS — I5022 Chronic systolic (congestive) heart failure: Secondary | ICD-10-CM | POA: Diagnosis not present

## 2018-08-12 ENCOUNTER — Ambulatory Visit (INDEPENDENT_AMBULATORY_CARE_PROVIDER_SITE_OTHER): Payer: PPO | Admitting: *Deleted

## 2018-08-12 DIAGNOSIS — I469 Cardiac arrest, cause unspecified: Secondary | ICD-10-CM

## 2018-08-12 LAB — CUP PACEART REMOTE DEVICE CHECK
Date Time Interrogation Session: 20200730131220
Implantable Lead Implant Date: 20181017
Implantable Lead Location: 753860
Implantable Pulse Generator Implant Date: 20181017
Pulse Gen Serial Number: 9781512

## 2018-08-13 NOTE — Progress Notes (Signed)
EPIC Encounter for ICM Monitoring  Patient Name: CHRISTEPHER MELCHIOR is a 68 y.o. male Date: 08/13/2018 Primary Care Physican: [place], [place] At Primary Cardiologist:Bensimhon Electrophysiologist:Allred 06/30/2018 Weight: 132 lbs 08/13/2018 Weight: 126 lbs   Heart failure questions reviewed and he is asymptomatic.  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   CorVueThoracic impedancenormal.  7/5 - 7/15  Prescribed: Furosemide 40 mg take 1 tablet PRN for swelling or weight gain. Potassium 20 mEq take 1 tablet PRN when taking Furosemide.   Labs: 03/12/2018 Creatinine0.96, BUN6, Potassium 4.7, Sodium131, GFR>60 03/05/2018 Creatinine1.25, BUN14, Potassium5.3, Sodium132, GFR58->60  03/01/2018 Creatinine1.24, BUN11, Potassium6.1, [id number], GFR59->60  02/26/2018 Creatinine1.06, BUN10, Potassium5.6, Sodium131, GFR>60 09/11/2017 Creatinine0.87, BUN7, Potassium4.6, Sodium134, GFR>60 09/03/2017 Creatinine0.89, BUN8, Potassium4.9, Sodium135, GFR>60 A complete set of results can be found in Results Review.  Recommendations: Encouraged to call for fluid symptoms.  Advised to replenish fluids if he has been outside in the heat to avoid dehydration.   Follow-up plan: ICM clinic phone appointment on8/31/2020.  Copy of ICM check sent to Dr.Allred   3 month ICM trend: 08/11/2018    1 Year ICM trend:       Rosalene Billings, RN 08/13/2018 2:32 PM

## 2018-08-20 ENCOUNTER — Encounter: Payer: Self-pay | Admitting: Cardiology

## 2018-08-20 NOTE — Progress Notes (Signed)
Remote ICD transmission.   

## 2018-09-13 ENCOUNTER — Ambulatory Visit (INDEPENDENT_AMBULATORY_CARE_PROVIDER_SITE_OTHER): Payer: PPO

## 2018-09-13 DIAGNOSIS — Z95810 Presence of automatic (implantable) cardiac defibrillator: Secondary | ICD-10-CM | POA: Diagnosis not present

## 2018-09-13 DIAGNOSIS — I5022 Chronic systolic (congestive) heart failure: Secondary | ICD-10-CM | POA: Diagnosis not present

## 2018-09-14 ENCOUNTER — Other Ambulatory Visit (HOSPITAL_COMMUNITY): Payer: Self-pay | Admitting: Internal Medicine

## 2018-09-14 NOTE — Progress Notes (Signed)
EPIC Encounter for ICM Monitoring  Patient Name: [name] [name] is a 68 y.o. male Date: 09/14/2018 Primary Care Physican: [place], [place] At Primary Cardiologist:Bensimhon Electrophysiologist:Allred 09/15/2018 Weight: 126 lbs   Heart failure questions reviewed and he is asymptomatic.  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 He is unsure what may be causing decreased impedance.   CorVueThoracic impedanceappears to be at baseline normal 09/13/2018 but suggestive of possible fluid accumulation from 8/3 - 8/15 and 8/18 - 8/30.  Prescribed: Furosemide 40 mg take 1 tablet PRN for swelling or weight gain. Potassium 20 mEq take 1 tablet PRN when taking Furosemide.   Labs: 03/12/2018 Creatinine0.96, BUN6, Potassium 4.7, Sodium131, GFR>60 03/05/2018 Creatinine1.25, BUN14, Potassium5.3, Sodium132, GFR58->60  03/01/2018 Creatinine1.24, BUN11, Potassium6.1, [id number], GFR59->60  02/26/2018 Creatinine1.06, BUN10, Potassium5.6, Sodium131, GFR>60 A complete set of results can be found in Results Review.  Recommendations: No changes and encouraged to call if experiencing any fluid symptoms.  Follow-up plan: ICM clinic phone appointment on 10/18/2018.   91 day device clinic remote transmission 11/11/2018.      Copy of ICM check sent to Dr. Rayann Heman.   3 month ICM trend: 09/13/2018    1 Year ICM trend:       Rosalene Billings, RN 09/14/2018 10:26 AM

## 2018-10-11 ENCOUNTER — Telehealth (HOSPITAL_COMMUNITY): Payer: Self-pay | Admitting: *Deleted

## 2018-10-11 NOTE — Telephone Encounter (Signed)
Received forms from [facility], pt needs extensive dental work and they need to know if pt should hold meds prior.   Per Dr Haroldine Laws: "ok to hold Eliquis x2 days if needed."  Note faxed back to them at [phone]

## 2018-10-19 ENCOUNTER — Telehealth: Payer: Self-pay

## 2018-10-19 NOTE — Telephone Encounter (Signed)
Left message for patient to remind of missed remote transmission.  

## 2018-10-25 ENCOUNTER — Other Ambulatory Visit (HOSPITAL_COMMUNITY): Payer: Self-pay

## 2018-10-25 DIAGNOSIS — H02831 Dermatochalasis of right upper eyelid: Secondary | ICD-10-CM | POA: Diagnosis not present

## 2018-10-25 DIAGNOSIS — Z961 Presence of intraocular lens: Secondary | ICD-10-CM | POA: Diagnosis not present

## 2018-10-25 DIAGNOSIS — H02834 Dermatochalasis of left upper eyelid: Secondary | ICD-10-CM | POA: Diagnosis not present

## 2018-10-25 DIAGNOSIS — H04123 Dry eye syndrome of bilateral lacrimal glands: Secondary | ICD-10-CM | POA: Diagnosis not present

## 2018-10-25 MED ORDER — CARVEDILOL 6.25 MG PO TABS: 9.3750 mg | ORAL_TABLET | Freq: Two times a day (BID) | ORAL | 3 refills | Status: DC

## 2018-10-25 NOTE — Progress Notes (Addendum)
No ICM remote transmission received for 10/18/2018 and next ICM transmission scheduled for 11/29/2018.   Patient has in office defib check on 10/29/2018 with Chanetta Marshall, NP.

## 2018-10-28 NOTE — Progress Notes (Signed)
Electrophysiology Office Note Date: 10/29/2018  ID:  [name] [name], [name] [name] 12-31-1950, MRN [id number]  PCP: [place], [place] At Primary Cardiologist: [place] Electrophysiologist: Allred  CC: Routine ICD follow-up  [name] [name] is a 68 y.o. male seen today for Dr Rayann Heman.  He presents today for routine electrophysiology followup.  Since last being seen in our clinic, the patient reports doing relatively well.  He is bothered by easy bruising and would really like to come off [place].  He denies chest pain, palpitations, dyspnea, PND, orthopnea, nausea, vomiting, dizziness, syncope, edema, weight gain, or early satiety.  He has not had ICD shocks.   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 AICD (automatic cardioverter/defibrillator) present   . CAD (coronary artery disease)   . Coronary artery disease    1980's-90's MI with perhaps angioplasty  . Hypertension   . Ischemic cardiomyopathy   . LV (left ventricular) mural thrombus following MI ([place])   . Myocardial infarction ([place]) 1999  . Ventricular fibrillation ([place]) 07/2016       Past Surgical History:  Procedure Laterality Date  . CORONARY ANGIOPLASTY  1999   "Dr. Vidal Schwalbe"  . HEMATOMA EVACUATION Left 08/15/2016   Procedure: INSERTION OF DRAIN ANTERIOR LEFT CHEST WALL HEMATOMA;  Surgeon: Ivin Poot, MD;  Location: [facility];  Service: Thoracic;  Laterality: Left;  . ICD IMPLANT N/A 10/29/2016   [employer] [place] VR model [phone] (serial  Number [id number]) ICD for secondary prevention of sudden death after prior VF arrest by Dr Rayann Heman  . LAPAROTOMY N/A 08/04/2016   Procedure: EXPLORATORY LAPAROTOMY FREE AIR BIOPSY OF PERFORATED STOMACH ULCER REPAIR OF PERFORATED STOMACH ULCER WITH A OMENTAL PATCH;  Surgeon: Georganna Skeans, MD;  Location: [facility];  Service: General;   Laterality: N/A;  . RIGHT/LEFT HEART CATH AND CORONARY ANGIOGRAPHY N/A 08/13/2016   Procedure: Right/Left Heart Cath and Coronary Angiography;  Surgeon: Jolaine Artist, MD;  Location: [facility];  Service: Cardiovascular;  Laterality: N/A;    Current Outpatient Medications  Medication Sig Dispense Refill  . acetaminophen (TYLENOL) 325 MG tablet Take 2 tablets (650 mg total) by mouth every 4 (four) hours as needed for mild pain or headache.    . carvedilol (COREG) 6.25 MG tablet Take 1.5 tablets (9.375 mg total) by mouth 2 (two) times daily with a meal. 90 tablet 3  . ELIQUIS 5 MG TABS tablet TAKE 1 TABLET BY MOUTH TWICE DAILY 60 tablet 5  . ENTRESTO 97-103 MG TAKE 1 TABLET BY MOUTH TWICE DAILY 60 tablet 5  . furosemide (LASIX) 40 MG tablet Take 1 tablet (40 mg total) daily as needed for swelling and/or weight gain. 30 tablet 3  . potassium chloride SA (KLOR-CON M20) 20 MEQ tablet Take 1 tablet (20 mEq total) daily when taking Furosemide as needed. 30 tablet 3  . rosuvastatin (CRESTOR) 10 MG tablet TAKE 1 TABLET BY MOUTH EVERY EVENING     No current facility-administered medications for this visit.     Allergies:   Spironolactone   Social History: Social History   Socioeconomic History  . Marital status: Married    Spouse name: Not on file  . Number of children: Not on file  . Years of education: Not on file  . Highest education level: Not on file  Occupational History  . Not on  file  Social Needs  . Financial resource strain: Not on file  . Food insecurity    Worry: Not on file    Inability: Not on file  . Transportation needs    Medical: Not on file    Non-medical: Not on file  Tobacco Use  . Smoking status: Current Every Day Smoker    Packs/day: 1.00    Years: 25.00    Pack years: 25.00    Types: Cigarettes  . Smokeless tobacco: Former Systems developer    Types: Chew  Substance and Sexual Activity  . Alcohol use: Yes    Alcohol/week: 14.0 standard drinks    Types:  14 Glasses of wine per week    Comment: 10/29/2016 "couple glasses of red wine/day"  . Drug use: No  . Sexual activity: Not Currently  Lifestyle  . Physical activity    Days per week: Not on file    Minutes per session: Not on file  . Stress: Not on file  Relationships  . Social Herbalist on phone: Not on file    Gets together: Not on file    Attends religious service: Not on file    Active member of club or organization: Not on file    Attends meetings of clubs or organizations: Not on file    Relationship status: Not on file  . Intimate partner violence    Fear of current or ex partner: Not on file    Emotionally abused: Not on file    Physically abused: Not on file    Forced sexual activity: Not on file  Other Topics Concern  . Not on file  Social History Narrative   Lives in [place] with spouse    Family History: Family History  Problem Relation Age of Onset  . Osteoporosis Mother   . Lung cancer Father   . Other Brother        died in [place]    Review of Systems: All other systems reviewed and are otherwise negative except as noted above.   Physical Exam: VS:  BP 128/80   Pulse 70   Ht 5\' 8"  (1.727 m)   Wt 129 lb 9.6 oz (58.8 kg)   SpO2 99%   BMI 19.71 kg/m  , BMI Body mass index is 19.71 kg/m.  GEN- The patient is well appearing, alert and oriented x 3 today.   HEENT: normocephalic, atraumatic; sclera clear, conjunctiva pink; hearing intact; oropharynx clear; neck supple  Lungs- Clear to ausculation bilaterally, normal work of breathing.  No wheezes, rales, rhonchi Heart- Regular rate and rhythm  GI- soft, non-tender, non-distended, bowel sounds present  Extremities- no clubbing, cyanosis, or edema  MS- no significant deformity or atrophy Skin- warm and dry, no rash or lesion; ICD pocket well healed Psych- euthymic mood, full affect Neuro- strength and sensation are intact  ICD interrogation- reviewed in detail today,  See PACEART  report  EKG:  EKG is not ordered today.  Recent Labs: 02/26/2018: B Natriuretic Peptide 172.0 03/05/2018: Hemoglobin 14.0; Magnesium 1.7; Platelets 167 03/12/2018: BUN 6; Creatinine, Ser 0.96; Potassium 4.7; Sodium 131   Wt Readings from Last 3 Encounters:  10/29/18 129 lb 9.6 oz (58.8 kg)  03/05/18 133 lb (60.3 kg)  02/26/18 137 lb 9.6 oz (62.4 kg)     Other studies Reviewed: Additional studies/ records that were reviewed today include: AHF notes  Assessment and Plan:  1.  Chronic systolic dysfunction euvolemic today Stable on an appropriate medical  regimen Normal ICD function See Pace Art report No changes today  2.  VF arrest No recurrent ventricular arrhythmias  3.  Paroxysmal atrial fibrillation Continue Eliquis for CHADS2VASC of 4 He has easy bruising and would like to come off [place] if possible. He would like to be considered for [facility] in the future.    Current medicines are reviewed at length with the patient today.   The patient does not have concerns regarding his medicines.  The following changes were made today:  none  Labs/ tests ordered today include:  No orders of the defined types were placed in this encounter.    Disposition:   Follow up with Delilah Shan, Dr Rayann Heman 1 year     Signed, Chanetta Marshall, NP 10/29/2018 9:18 AM  [facility] [address] [place]  [id number] [phone] (office) [phone] (fax)

## 2018-10-29 ENCOUNTER — Other Ambulatory Visit: Payer: Self-pay

## 2018-10-29 ENCOUNTER — Ambulatory Visit: Payer: PPO | Admitting: Nurse Practitioner

## 2018-10-29 ENCOUNTER — Encounter: Payer: Self-pay | Admitting: Nurse Practitioner

## 2018-10-29 VITALS — BP 128/80 | HR 70 | Ht 68.0 in | Wt 129.6 lb

## 2018-10-29 DIAGNOSIS — I5022 Chronic systolic (congestive) heart failure: Secondary | ICD-10-CM | POA: Diagnosis not present

## 2018-10-29 DIAGNOSIS — I480 Paroxysmal atrial fibrillation: Secondary | ICD-10-CM | POA: Diagnosis not present

## 2018-10-29 DIAGNOSIS — I469 Cardiac arrest, cause unspecified: Secondary | ICD-10-CM | POA: Diagnosis not present

## 2018-10-29 LAB — CUP PACEART INCLINIC DEVICE CHECK
Date Time Interrogation Session: 20201016091343
Implantable Lead Implant Date: 20181017
Implantable Lead Location: 753860
Implantable Pulse Generator Implant Date: 20181017
Pulse Gen Serial Number: 9781512

## 2018-10-29 NOTE — Patient Instructions (Signed)
Medication Instructions:  none *If you need a refill on your cardiac medications before your next appointment, please call your pharmacy*  Lab Work: none If you have labs (blood work) drawn today and your tests are completely normal, you will receive your results only by: Marland Kitchen MyChart Message (if you have MyChart) OR . A paper copy in the mail If you have any lab test that is abnormal or we need to change your treatment, we will call you to review the results.  Testing/Procedures: none  Follow-Up: At [facility], you and your health needs are our priority.  As part of our continuing mission to provide you with exceptional heart care, we have created designated Provider Care Teams.  These Care Teams include your primary Cardiologist (physician) and Advanced Practice Providers (APPs -  Physician Assistants and Nurse Practitioners) who all work together to provide you with the care you need, when you need it.  Your next appointment:   12 months  The format for your next appointment:   In Person  Provider:   Dr Rayann Heman  Other Instructions Remote monitoring is used to monitor your  ICD from home. This monitoring reduces the number of office visits required to check your device to one time per year. It allows [place] to keep an eye on the functioning of your device to ensure it is working properly. You are scheduled for a device check from home on 11/11/18. You may send your transmission at any time that day. If you have a wireless device, the transmission will be sent automatically. After your physician reviews your transmission, you will receive a postcard with your next transmission date.

## 2018-11-11 ENCOUNTER — Ambulatory Visit (INDEPENDENT_AMBULATORY_CARE_PROVIDER_SITE_OTHER): Payer: PPO | Admitting: *Deleted

## 2018-11-11 DIAGNOSIS — I4901 Ventricular fibrillation: Secondary | ICD-10-CM | POA: Diagnosis not present

## 2018-11-11 DIAGNOSIS — I5022 Chronic systolic (congestive) heart failure: Secondary | ICD-10-CM

## 2018-11-11 LAB — CUP PACEART REMOTE DEVICE CHECK
Battery Remaining Longevity: 83 mo
Battery Remaining Percentage: 81 %
Battery Voltage: 3.01 V
Brady Statistic RV Percent Paced: 0 %
Date Time Interrogation Session: 20201029080027
HighPow Impedance: 66 Ohm
HighPow Impedance: 66 Ohm
Implantable Lead Implant Date: 20181017
Implantable Lead Location: 753860
Implantable Pulse Generator Implant Date: 20181017
Lead Channel Impedance Value: 450 Ohm
Lead Channel Pacing Threshold Amplitude: 0.5 V
Lead Channel Pacing Threshold Pulse Width: 0.5 ms
Lead Channel Sensing Intrinsic Amplitude: 12 mV
Lead Channel Setting Pacing Amplitude: 2.5 V
Lead Channel Setting Pacing Pulse Width: 0.5 ms
Lead Channel Setting Sensing Sensitivity: 0.5 mV
Pulse Gen Serial Number: 9781512

## 2018-11-29 ENCOUNTER — Ambulatory Visit (INDEPENDENT_AMBULATORY_CARE_PROVIDER_SITE_OTHER): Payer: PPO

## 2018-11-29 DIAGNOSIS — Z95810 Presence of automatic (implantable) cardiac defibrillator: Secondary | ICD-10-CM | POA: Diagnosis not present

## 2018-11-29 DIAGNOSIS — I5022 Chronic systolic (congestive) heart failure: Secondary | ICD-10-CM | POA: Diagnosis not present

## 2018-12-01 NOTE — Progress Notes (Signed)
EPIC Encounter for ICM Monitoring  Patient Name: [name] [name] is a 68 y.o. male Date: 12/01/2018 Primary Care Physican: [place], [place] At Primary Cardiologist:Bensimhon Electrophysiologist:Allred 12/01/2018 Weight: 128-129 lbs   Heart failure questions reviewed and he is asymptomatic.  He asked if Dr Coralee Pesa office faxed a completed form about holding Eliquis for upcoming Dental Procedure.  Advised 10/11/2018 Epic note says the form was faxed to [facility] to hold Eliquis x 2 days if needed.   CorVueThoracic impedancenormal.   Prescribed: Furosemide 40 mg take 1 tablet PRN for swelling or weight gain. Potassium 20 mEq take 1 tablet PRN when taking Furosemide.   Labs: 03/12/2018 Creatinine0.96, BUN6, Potassium 4.7, Sodium131, GFR>60 03/05/2018 Creatinine1.25, BUN14, Potassium5.3, Sodium132, GFR58->60  03/01/2018 Creatinine1.24, BUN11, Potassium6.1, H403076, GFR59->60  02/26/2018 Creatinine1.06, BUN10, Potassium5.6, Sodium131, GFR>60 A complete set of results can be found in Results Review.  Recommendations: No changes and encouraged to call if experiencing any fluid symptoms.  Follow-up plan: ICM clinic phone appointment on 12/30/2018.   91 day device clinic remote transmission 02/10/2019.    Copy of ICM check sent to Dr.Allred.   3 month ICM trend: 11/30/2018    1 Year ICM trend:       Rosalene Billings, RN 12/01/2018 12:31 PM

## 2018-12-03 NOTE — Progress Notes (Signed)
Remote ICD transmission.   

## 2018-12-13 ENCOUNTER — Other Ambulatory Visit (HOSPITAL_COMMUNITY): Payer: Self-pay

## 2018-12-13 MED ORDER — APIXABAN 5 MG PO TABS: 5.0000 mg | ORAL_TABLET | Freq: Two times a day (BID) | ORAL | 5 refills | Status: DC

## 2018-12-30 ENCOUNTER — Ambulatory Visit (INDEPENDENT_AMBULATORY_CARE_PROVIDER_SITE_OTHER): Payer: PPO

## 2018-12-30 DIAGNOSIS — I5022 Chronic systolic (congestive) heart failure: Secondary | ICD-10-CM | POA: Diagnosis not present

## 2018-12-30 DIAGNOSIS — Z95810 Presence of automatic (implantable) cardiac defibrillator: Secondary | ICD-10-CM

## 2018-12-31 NOTE — Progress Notes (Signed)
EPIC Encounter for ICM Monitoring  Patient Name: [name] [name] is a 67 y.o. male Date: 12/31/2018 Primary Care Physican: [place], [place] At Primary Cardiologist:Bensimhon Electrophysiologist:Allred 12/31/2018 Weight: 128-129 lbs   Heart failure questions reviewed and he is asymptomatic.    CorVueThoracic impedancenormal but was suggestive of possible fluid accumulation 11/29 - 12/05.   Prescribed: Furosemide 40 mg take 1 tablet PRN for swelling or weight gain. Potassium 20 mEq take 1 tablet PRN when taking Furosemide.   Labs: 03/12/2018 Creatinine0.96, BUN6, Potassium 4.7, Sodium131, GFR>60 03/05/2018 Creatinine1.25, BUN14, Potassium5.3, Sodium132, GFR58->60  03/01/2018 Creatinine1.24, BUN11, Potassium6.1, H403076, GFR59->60  02/26/2018 Creatinine1.06, BUN10, Potassium5.6, Sodium131, GFR>60 A complete set of results can be found in Results Review.  Recommendations: No changes and encouraged to call if experiencing any fluid symptoms.  Follow-up plan: ICM clinic phone appointment on 02/11/2019.   91 day device clinic remote transmission 02/10/2019.    Copy of ICM check sent to Dr.Allred.   3 month ICM trend: 12/31/2018    1 Year ICM trend:       Rosalene Billings, RN 12/31/2018 4:39 PM

## 2019-01-21 ENCOUNTER — Telehealth (HOSPITAL_COMMUNITY): Payer: Self-pay | Admitting: Pharmacist

## 2019-01-21 NOTE — Telephone Encounter (Signed)
[place] for copay assistance for [employer].  Member ID: [id number] Group ID: [id number] RxBin ID: [id number] PCN: [id number] Eligibility Start Date: 01/17/2019 Eligibility End Date: 01/16/2020 Assistance Amount: $1,000.00  Will update pharmacy with new card information.  Audry Riles, PharmD, BCPS, BCCP, CPP Heart Failure Clinic Pharmacist [phone]

## 2019-02-10 ENCOUNTER — Ambulatory Visit (INDEPENDENT_AMBULATORY_CARE_PROVIDER_SITE_OTHER): Payer: PPO | Admitting: *Deleted

## 2019-02-10 DIAGNOSIS — I4901 Ventricular fibrillation: Secondary | ICD-10-CM | POA: Diagnosis not present

## 2019-02-10 LAB — CUP PACEART REMOTE DEVICE CHECK
Battery Remaining Longevity: 82 mo
Battery Remaining Percentage: 79 %
Battery Voltage: 2.99 V
Brady Statistic RV Percent Paced: 1 %
Date Time Interrogation Session: 20210128073531
HighPow Impedance: 61 Ohm
HighPow Impedance: 61 Ohm
Implantable Lead Implant Date: 20181017
Implantable Lead Location: 753860
Implantable Pulse Generator Implant Date: 20181017
Lead Channel Impedance Value: 460 Ohm
Lead Channel Pacing Threshold Amplitude: 0.5 V
Lead Channel Pacing Threshold Pulse Width: 0.5 ms
Lead Channel Sensing Intrinsic Amplitude: 12 mV
Lead Channel Setting Pacing Amplitude: 2.5 V
Lead Channel Setting Pacing Pulse Width: 0.5 ms
Lead Channel Setting Sensing Sensitivity: 0.5 mV
Pulse Gen Serial Number: 9781512

## 2019-02-10 NOTE — Progress Notes (Signed)
ICD Remote  

## 2019-02-11 ENCOUNTER — Ambulatory Visit (INDEPENDENT_AMBULATORY_CARE_PROVIDER_SITE_OTHER): Payer: PPO

## 2019-02-11 DIAGNOSIS — I5022 Chronic systolic (congestive) heart failure: Secondary | ICD-10-CM | POA: Diagnosis not present

## 2019-02-11 DIAGNOSIS — Z95810 Presence of automatic (implantable) cardiac defibrillator: Secondary | ICD-10-CM | POA: Diagnosis not present

## 2019-02-14 ENCOUNTER — Telehealth: Payer: Self-pay

## 2019-02-14 NOTE — Telephone Encounter (Signed)
Remote ICM transmission received.  Attempted call to patient regarding ICM remote transmission and left detailed message per DPR.  Advised to return call for any fluid symptoms or questions. Next ICM remote transmission scheduled 03/14/2019.

## 2019-02-14 NOTE — Progress Notes (Signed)
Spoke with patient and he is doing well.  Transmission reviewed.  He denies any fluid symptoms.  Current weight is 131 lbs.  No changes and encouraged to call if experiencing any fluid symptoms.

## 2019-02-14 NOTE — Progress Notes (Signed)
EPIC Encounter for ICM Monitoring  Patient Name: [name] [name] is a 69 y.o. male Date: 02/14/2019 Primary Care Physican: [place], [place] At Primary Cardiologist:Bensimhon Electrophysiologist:Allred 12/31/2018 Weight: 128-129lbs   Attempted call to patient and unable to reach.  Transmission reviewed.   CorVueThoracic impedancenormal.   Prescribed: Furosemide 40 mg take 1 tablet PRN for swelling or weight gain. Potassium 20 mEq take 1 tablet PRN when taking Furosemide.   Labs: 03/12/2018 Creatinine0.96, BUN6, Potassium 4.7, Sodium131, GFR>60 03/05/2018 Creatinine1.25, BUN14, Potassium5.3, Sodium132, GFR58->60  03/01/2018 Creatinine1.24, BUN11, Potassium6.1, H403076, GFR59->60  02/26/2018 Creatinine1.06, BUN10, Potassium5.6, Sodium131, GFR>60 A complete set of results can be found in Results Review.  Recommendations:  Unable to reach.    Follow-up plan: ICM clinic phone appointment on 03/14/2019.   91 day device clinic remote transmission 05/12/2019.   Copy of ICM check sent to Dr. Rayann Heman.   3 month ICM trend: 02/10/2019    1 Year ICM trend:       Rosalene Billings, RN 02/14/2019 11:18 AM

## 2019-02-22 ENCOUNTER — Other Ambulatory Visit (HOSPITAL_COMMUNITY): Payer: Self-pay

## 2019-02-22 MED ORDER — CARVEDILOL 6.25 MG PO TABS: 9.3750 mg | ORAL_TABLET | Freq: Two times a day (BID) | ORAL | 3 refills | Status: DC

## 2019-03-14 ENCOUNTER — Ambulatory Visit (INDEPENDENT_AMBULATORY_CARE_PROVIDER_SITE_OTHER): Payer: PPO

## 2019-03-14 DIAGNOSIS — Z95810 Presence of automatic (implantable) cardiac defibrillator: Secondary | ICD-10-CM

## 2019-03-14 DIAGNOSIS — I5022 Chronic systolic (congestive) heart failure: Secondary | ICD-10-CM

## 2019-03-16 NOTE — Progress Notes (Signed)
EPIC Encounter for ICM Monitoring  Patient Name: [name] [name] is a 69 y.o. male Date: 03/16/2019 Primary Care Physican: [place], [place] At Primary Cardiologist:Bensimhon Electrophysiologist:Allred 03/16/2019 Weight: 128-129lbs   Spoke with patient and reports feeling well at this time.  Denies fluid symptoms.    CorVueThoracic impedancenormal.  Prescribed: Furosemide 40 mg take 1 tablet PRN for swelling or weight gain. Potassium 20 mEq take 1 tablet PRN when taking Furosemide.   Labs: 03/12/2018 Creatinine0.96, BUN6, Potassium 4.7, Sodium131, GFR>60 03/05/2018 Creatinine1.25, BUN14, Potassium5.3, Sodium132, GFR58->60  03/01/2018 Creatinine1.24, BUN11, Potassium6.1, H403076, GFR59->60  02/26/2018 Creatinine1.06, BUN10, Potassium5.6, Sodium131, GFR>60 A complete set of results can be found in Results Review.  Recommendations:  No changes and encouraged to call if experiencing any fluid symptoms.  Follow-up plan: ICM clinic phone appointment on 04/18/2019.   91 day device clinic remote transmission 05/12/2019.   Copy of ICM check sent to Dr. Rayann Heman.    3 month ICM trend: 03/14/2019    1 Year ICM trend:       Rosalene Billings, RN 03/16/2019 1:38 PM

## 2019-03-28 ENCOUNTER — Other Ambulatory Visit (HOSPITAL_COMMUNITY): Payer: Self-pay | Admitting: *Deleted

## 2019-03-28 MED ORDER — ENTRESTO 97-103 MG PO TABS: 1.0000 | ORAL_TABLET | Freq: Two times a day (BID) | ORAL | 0 refills | Status: DC

## 2019-04-18 ENCOUNTER — Ambulatory Visit (INDEPENDENT_AMBULATORY_CARE_PROVIDER_SITE_OTHER): Payer: PPO

## 2019-04-18 DIAGNOSIS — I5022 Chronic systolic (congestive) heart failure: Secondary | ICD-10-CM | POA: Diagnosis not present

## 2019-04-18 DIAGNOSIS — Z95810 Presence of automatic (implantable) cardiac defibrillator: Secondary | ICD-10-CM | POA: Diagnosis not present

## 2019-04-19 ENCOUNTER — Telehealth: Payer: Self-pay

## 2019-04-19 NOTE — Telephone Encounter (Signed)
Remote ICM transmission received.  Attempted call to patient regarding ICM remote transmission and left message per DPR to return call.   

## 2019-04-19 NOTE — Progress Notes (Signed)
EPIC Encounter for ICM Monitoring  Patient Name: [name] [name] is a 69 y.o. male Date: 04/19/2019 Primary Care Physican: [place], [place] At Primary Cardiologist:Bensimhon Electrophysiologist:Allred 03/16/2019 Weight: 128-129lbs   Attempted call to patient and unable to reach.  Left message to return call. Transmission reviewed.   CorVueThoracic impedancenormal but was suggesting possible fluid accumulation from 3/20 -3/31.  Prescribed: Furosemide 40 mg take 1 tablet PRN for swelling or weight gain. Potassium 20 mEq take 1 tablet PRN when taking Furosemide.   Labs: 03/12/2018 Creatinine0.96, BUN6, Potassium 4.7, Sodium131, GFR>60 03/05/2018 Creatinine1.25, BUN14, Potassium5.3, Sodium132, GFR58->60  03/01/2018 Creatinine1.24, BUN11, Potassium6.1, H403076, GFR59->60  02/26/2018 Creatinine1.06, BUN10, Potassium5.6, Sodium131, GFR>60 A complete set of results can be found in Results Review.  Recommendations: Unable to reach.    Follow-up plan: ICM clinic phone appointment on 05/23/2019. 91 day device clinic remote transmission 05/12/2019.   Copy of ICM check sent to Dr.Allred.    3 month ICM trend: 04/18/2019    1 Year ICM trend:       Rosalene Billings, RN 04/19/2019 9:30 AM

## 2019-04-23 ENCOUNTER — Ambulatory Visit: Payer: PPO | Attending: Internal Medicine

## 2019-04-23 DIAGNOSIS — Z23 Encounter for immunization: Secondary | ICD-10-CM

## 2019-04-23 NOTE — Progress Notes (Signed)
   Covid-19 Vaccination Clinic  Name:  [name] [name]    MRN: [id number] DOB: 26-Dec-1950  04/23/2019  [name] [name] was observed post Covid-19 immunization for 30 minutes based on pre-vaccination screening without incident. He was provided with Vaccine Information Sheet and instruction to access the V-Safe system.   [name] [name] was instructed to call 911 with any severe reactions post vaccine: Marland Kitchen Difficulty breathing  . Swelling of face and throat  . A fast heartbeat  . A bad rash all over body  . Dizziness and weakness   Immunizations Administered    Name Date Dose VIS Date Route   Pfizer COVID-19 Vaccine 04/23/2019 10:24 AM 0.3 mL 12/24/2018 Intramuscular   Manufacturer: [place]   Lot: [id number]   [place]: [id number]

## 2019-04-25 ENCOUNTER — Other Ambulatory Visit (HOSPITAL_COMMUNITY): Payer: Self-pay

## 2019-04-28 ENCOUNTER — Other Ambulatory Visit (HOSPITAL_COMMUNITY): Payer: Self-pay

## 2019-04-28 MED ORDER — ENTRESTO 97-103 MG PO TABS: 1.0000 | ORAL_TABLET | Freq: Two times a day (BID) | ORAL | 0 refills | Status: DC

## 2019-05-12 ENCOUNTER — Ambulatory Visit (INDEPENDENT_AMBULATORY_CARE_PROVIDER_SITE_OTHER): Payer: PPO | Admitting: *Deleted

## 2019-05-12 DIAGNOSIS — I4901 Ventricular fibrillation: Secondary | ICD-10-CM

## 2019-05-13 LAB — CUP PACEART REMOTE DEVICE CHECK
Battery Remaining Longevity: 79 mo
Battery Remaining Percentage: 77 %
Battery Voltage: 2.99 V
Brady Statistic RV Percent Paced: 1 %
Date Time Interrogation Session: 20210430051612
HighPow Impedance: 66 Ohm
HighPow Impedance: 66 Ohm
Implantable Lead Implant Date: 20181017
Implantable Lead Location: 753860
Implantable Pulse Generator Implant Date: 20181017
Lead Channel Impedance Value: 480 Ohm
Lead Channel Pacing Threshold Amplitude: 0.5 V
Lead Channel Pacing Threshold Pulse Width: 0.5 ms
Lead Channel Sensing Intrinsic Amplitude: 12 mV
Lead Channel Setting Pacing Amplitude: 2.5 V
Lead Channel Setting Pacing Pulse Width: 0.5 ms
Lead Channel Setting Sensing Sensitivity: 0.5 mV
Pulse Gen Serial Number: 9781512

## 2019-05-13 NOTE — Progress Notes (Signed)
ICD Remote  

## 2019-05-17 ENCOUNTER — Ambulatory Visit: Payer: PPO | Attending: Internal Medicine

## 2019-05-17 DIAGNOSIS — Z23 Encounter for immunization: Secondary | ICD-10-CM

## 2019-05-17 NOTE — Progress Notes (Signed)
   Covid-19 Vaccination Clinic  Name:  [name] [name]    MRN: [id number] DOB: January 07, 1951  05/17/2019  [name] [name] was observed post Covid-19 immunization for 15 minutes without incident. He was provided with Vaccine Information Sheet and instruction to access the V-Safe system.   [name] [name] was instructed to call 911 with any severe reactions post vaccine: Marland Kitchen Difficulty breathing  . Swelling of face and throat  . A fast heartbeat  . A bad rash all over body  . Dizziness and weakness   Immunizations Administered    Name Date Dose VIS Date Route   Pfizer COVID-19 Vaccine 05/17/2019 10:17 AM 0.3 mL 03/09/2018 Intramuscular   Manufacturer: [place]   Lot: [id number]   [place]: [id number]

## 2019-05-19 ENCOUNTER — Other Ambulatory Visit (HOSPITAL_COMMUNITY): Payer: Self-pay

## 2019-05-20 ENCOUNTER — Other Ambulatory Visit (HOSPITAL_COMMUNITY): Payer: Self-pay

## 2019-05-23 ENCOUNTER — Ambulatory Visit (INDEPENDENT_AMBULATORY_CARE_PROVIDER_SITE_OTHER): Payer: PPO

## 2019-05-23 DIAGNOSIS — Z95810 Presence of automatic (implantable) cardiac defibrillator: Secondary | ICD-10-CM | POA: Diagnosis not present

## 2019-05-23 DIAGNOSIS — I5022 Chronic systolic (congestive) heart failure: Secondary | ICD-10-CM

## 2019-05-24 ENCOUNTER — Other Ambulatory Visit (HOSPITAL_COMMUNITY): Payer: Self-pay

## 2019-05-25 NOTE — Progress Notes (Signed)
EPIC Encounter for ICM Monitoring  Patient Name: [name] [name] is a 69 y.o. male Date: 05/25/2019 Primary Care Physican: [place], [place] At Primary Cardiologist:Bensimhon Electrophysiologist:Allred 5/12/2021Weight: 128-130lbs   Spoke with patient and reports feeling well at this time.  Denies fluid symptoms.    CorVueThoracic impedancenormal.  Prescribed:   Furosemide 40 mg take 1 tablet PRN for swelling or weight gain.   Potassium    20 mEq take 1 tablet PRN when taking Furosemide.   Labs: 03/12/2018 Creatinine0.96, BUN6, Potassium 4.7, Sodium131, GFR>60 03/05/2018 Creatinine1.25, BUN14, Potassium5.3, Sodium132, GFR58->60  03/01/2018 Creatinine1.24, BUN11, Potassium6.1, H403076, GFR59->60  02/26/2018 Creatinine1.06, BUN10, Potassium5.6, Sodium131, GFR>60 A complete set of results can be found in Results Review.  Recommendations:No changes and encouraged to call if experiencing any fluid symptoms.  Follow-up plan: ICM clinic phone appointment on6/14/2021. 91 day device clinic remote transmission 08/11/2019.  Office appointment with Dr Haroldine Laws 07/06/2019.  Copy of ICM check sent to Dr.Allred.  3 month ICM trend: 05/23/2019    1 Year ICM trend:       Rosalene Billings, RN 05/25/2019 8:09 AM

## 2019-05-26 ENCOUNTER — Other Ambulatory Visit: Payer: Self-pay

## 2019-05-26 ENCOUNTER — Other Ambulatory Visit (HOSPITAL_COMMUNITY): Payer: Self-pay

## 2019-05-26 NOTE — Telephone Encounter (Signed)
This is a CHF pt 

## 2019-05-26 NOTE — Telephone Encounter (Signed)
error 

## 2019-05-27 ENCOUNTER — Other Ambulatory Visit (HOSPITAL_COMMUNITY): Payer: Self-pay

## 2019-05-27 MED ORDER — ENTRESTO 97-103 MG PO TABS: 1.0000 | ORAL_TABLET | Freq: Two times a day (BID) | ORAL | 0 refills | Status: DC

## 2019-06-03 ENCOUNTER — Other Ambulatory Visit (HOSPITAL_COMMUNITY): Payer: Self-pay

## 2019-06-03 MED ORDER — APIXABAN 5 MG PO TABS: 5.0000 mg | ORAL_TABLET | Freq: Two times a day (BID) | ORAL | 0 refills | Status: DC

## 2019-06-14 ENCOUNTER — Other Ambulatory Visit (HOSPITAL_COMMUNITY): Payer: Self-pay

## 2019-06-14 MED ORDER — CARVEDILOL 6.25 MG PO TABS: 9.3750 mg | ORAL_TABLET | Freq: Two times a day (BID) | ORAL | 0 refills | Status: DC

## 2019-06-14 NOTE — Telephone Encounter (Signed)
Meds ordered this encounter  Medications  . carvedilol (COREG) 6.25 MG tablet    Sig: Take 1.5 tablets (9.375 mg total) by mouth 2 (two) times daily with a meal. Needs appt for further refills    Dispense:  270 tablet    Refill:  0    Please cancel all previous orders for current medication. Change in dosage or pill size.

## 2019-06-17 ENCOUNTER — Other Ambulatory Visit (HOSPITAL_COMMUNITY): Payer: Self-pay

## 2019-06-17 MED ORDER — ENTRESTO 97-103 MG PO TABS: 1.0000 | ORAL_TABLET | Freq: Two times a day (BID) | ORAL | 0 refills | Status: DC

## 2019-06-27 ENCOUNTER — Ambulatory Visit (INDEPENDENT_AMBULATORY_CARE_PROVIDER_SITE_OTHER): Payer: PPO

## 2019-06-27 DIAGNOSIS — I5022 Chronic systolic (congestive) heart failure: Secondary | ICD-10-CM | POA: Diagnosis not present

## 2019-06-27 DIAGNOSIS — Z95810 Presence of automatic (implantable) cardiac defibrillator: Secondary | ICD-10-CM | POA: Diagnosis not present

## 2019-07-01 NOTE — Progress Notes (Signed)
EPIC Encounter for ICM Monitoring  Patient Name: [name] [name] is a 69 y.o. male Date: 07/01/2019 Primary Care Physican: [place], [place] At Primary Cardiologist:Bensimhon Electrophysiologist:Allred 5/12/2021Weight: 128-130lbs   Spoke with patient and reports feeling well at this time.  Denies fluid symptoms.    CorVueThoracic impedancenormal.  Prescribed:   Furosemide 40 mg take 1 tablet PRN for swelling or weight gain.   Potassium    20 mEq take 1 tablet PRN when taking Furosemide.   Labs: 03/12/2018 Creatinine0.96, BUN6, Potassium 4.7, Sodium131, GFR>60 03/05/2018 Creatinine1.25, BUN14, Potassium5.3, Sodium132, GFR58->60  03/01/2018 Creatinine1.24, BUN11, Potassium6.1, [id number], GFR59->60  02/26/2018 Creatinine1.06, BUN10, Potassium5.6, Sodium131, GFR>60 A complete set of results can be found in Results Review.  Recommendations:No changes and encouraged to call if experiencing any fluid symptoms.  Follow-up plan: ICM clinic phone appointment on7/19/2021. 91 day device clinic remote transmission 08/11/2019.  Office appointment with Dr Haroldine Laws 07/06/2019.  Copy of ICM check sent to Dr.Allred.  3 month ICM trend: 07/01/2019      1 Year ICM trend:       Rosalene Billings, RN 07/01/2019 1:26 PM

## 2019-07-05 NOTE — Progress Notes (Signed)
Advanced Heart Failure Clinic Note   Primary Cardiologist: Dr. Haroldine Laws  EP: Dr. Rayann Heman  HPI  SHO [name] [name] is a 69 y.o. male with history of Systolic CHF, CAD, HTN, HLD, tobacco abuse, and BPH.   Had complicated admission 07/25/43 to 08/19/16 due to out of hospital cardiac arrest c/b cpr/defib and gi perforation thought 2/2 to rib fracture as well as large chest wall hematoma and t8 vertebral fracture thought all from CPR. Noted to have LV thrombus as well.  EP saw and recommended ICD as outpatient with long, complicated admission. Meds adjusted as tolerated. Pt underwent multiple procedures including exploratory laparotomy and repair of perforated stomach with an omental patch by General Surgery and evacuation of chest hematoma by TCTS. Cath as below with 3v disease though mostly non-obstructive, medical management pursued.  Lifevest placed prior to discharge.   He presents today for follow up. Feels great. Working in the garden. Walking his [place] Spaniel. No CP, SOB or edema. Having very frequent bruising. No ICD firings   ICD interrogation: No VT/AF Impedance looks good. Personally reviewe   Echo 02/26/18  35-40% with antero-septal AK   Echo 12/2016 40-45% with antero-septal AK   Echo 08/06/16 - LVEF 35-40%, grade 1 DD. Apparent medium-sized apical thrombus.   Left/Right heart cath 08/13/16  Ost RCA lesion, 80 %stenosed.  Ost LM lesion, 40 %stenosed.  Ost Ramus to Ramus lesion, 80 %stenosed.  Ost LAD to Prox LAD lesion, 70 %stenosed.  Ost 1st Diag to 1st Diag lesion, 75 %stenosed.  Mid LAD to Dist LAD lesion, 40 %stenosed.  Findings:  Ao = 141/70 (102) LV = 126/13 RA = 5 RV = 29/8 PA = 33/9 (22) PCW = 12 Fick cardiac output/index = 5.3/3.0 PVR = 1.9 WU Ao sat = 92%  PA sat = 55%,54%  Review of systems complete and found to be negative unless listed in HPI.    Past Medical History:  Diagnosis Date  . AICD (automatic cardioverter/defibrillator) present     . CAD (coronary artery disease)   . Coronary artery disease    1980's-90's MI with perhaps angioplasty  . Hypertension   . Ischemic cardiomyopathy   . LV (left ventricular) mural thrombus following MI ([place])   . Myocardial infarction ([place]) 1999  . Ventricular fibrillation ([place]) 07/2016       Current Outpatient Medications  Medication Sig Dispense Refill  . apixaban (ELIQUIS) 5 MG TABS tablet Take 1 tablet (5 mg total) by mouth 2 (two) times daily. Needs appt for further refills 180 tablet 0  . carvedilol (COREG) 6.25 MG tablet Take 1.5 tablets (9.375 mg total) by mouth 2 (two) times daily with a meal. Needs appt for further refills 270 tablet 0  . rosuvastatin (CRESTOR) 10 MG tablet TAKE 1 TABLET BY MOUTH EVERY EVENING    . sacubitril-valsartan (ENTRESTO) 97-103 MG Take 1 tablet by mouth 2 (two) times daily. Needs appt for further refills 180 tablet 0  . acetaminophen (TYLENOL) 325 MG tablet Take 2 tablets (650 mg total) by mouth every 4 (four) hours as needed for mild pain or headache. (Patient not taking: Reported on 07/06/2019)    . furosemide (LASIX) 40 MG tablet Take 1 tablet (40 mg total) daily as needed for swelling and/or weight gain. (Patient not taking: Reported on 07/06/2019) 30 tablet 3  . potassium chloride SA (KLOR-CON M20) 20 MEQ tablet Take 1 tablet (20 mEq total) daily when taking Furosemide as needed. (Patient not taking: Reported on 07/06/2019) 30  tablet 3   No current facility-administered medications for this encounter.   Allergies  Allergen Reactions  . Spironolactone Other (See Comments)    Breast pain--swelling/inflammation    Social History   Socioeconomic History  . Marital status: Married    Spouse name: Not on file  . Number of children: Not on file  . Years of education: Not on file  . Highest education level: Not on file  Occupational History  . Not on file  Tobacco Use  . Smoking status: Current Every Day Smoker    Packs/day: 1.00    Years: 25.00     Pack years: 25.00    Types: Cigarettes  . Smokeless tobacco: Former Systems developer    Types: Secondary school teacher  . Vaping Use: Never used  Substance and Sexual Activity  . Alcohol use: Yes    Alcohol/week: 14.0 standard drinks    Types: 14 Glasses of wine per week    Comment: 10/29/2016 "couple glasses of red wine/day"  . Drug use: No  . Sexual activity: Not Currently  Other Topics Concern  . Not on file  Social History Narrative   Lives in [place] with spouse   Social Determinants of Health   Financial Resource Strain:   . Difficulty of Paying Living Expenses:   Food Insecurity:   . Worried About Charity fundraiser in the Last Year:   . Arboriculturist in the Last Year:   Transportation Needs:   . Film/video editor (Medical):   Marland Kitchen Lack of Transportation (Non-Medical):   Physical Activity:   . Days of Exercise per Week:   . Minutes of Exercise per Session:   Stress:   . Feeling of Stress :   Social Connections:   . Frequency of Communication with Friends and Family:   . Frequency of Social Gatherings with Friends and Family:   . Attends Religious Services:   . Active Member of Clubs or Organizations:   . Attends Archivist Meetings:   Marland Kitchen Marital Status:   Intimate Partner Violence:   . Fear of Current or Ex-Partner:   . Emotionally Abused:   Marland Kitchen Physically Abused:   . Sexually Abused:     Family History  Problem Relation Age of Onset  . Osteoporosis Mother   . Lung cancer Father   . Other Brother        died in [place]   Vitals:   17-Jul-2019 0930  BP: (!) 168/76  Pulse: 65  SpO2: 100%  Weight: 59 kg (130 lb)   Wt Readings from Last 3 Encounters:  July 17, 2019 59 kg (130 lb)  10/29/18 58.8 kg (129 lb 9.6 oz)  03/05/18 60.3 kg (133 lb)    PHYSICAL EXAM: General:  Elderly Thin. No resp difficulty HEENT: normal Neck: supple. no JVD. Carotids 2+ bilat; no bruits. No lymphadenopathy or thryomegaly appreciated. Cor: PMI nondisplaced. Regular rate &  rhythm. No rubs, gallops or murmurs. Lungs: clear. Mildly decreased BS  Abdomen: soft, nontender, nondistended. No hepatosplenomegaly. No bruits or masses. Good bowel sounds. Extremities: no cyanosis, clubbing, rash, edema. Ecchymosis on L arm  Neuro: alert & orientedx3, cranial nerves grossly intact. moves all 4 extremities w/o difficulty. Affect pleasant  ECG: NSR 67 anterior and inferior Qs Personally reviewed  ASSESSMENT & PLAN:  1. CAD with ICM s/p Cardiac Arrest VT/VF:  - Cath 08/13/16 with 3V disease (mostly non-obstructive)  medical therapy. If develops angina can consider PCI to Ramus.  - Doing  well. No s/s ischemia - Off stop ASA with With Eliquis 2. Chronic systolic HF.  - Echo 09/28/92 - LVEF 35-40%, grade 1 DD. Apparent medium-sized apical thrombus.  - Echo 12/2016 EF 40-45% No LV thrombus - Echo 2/210 EF 35-40 - NYHA I - Volume status stable on exam and ICD interrogation (done personally) - BP elevated in setting of not taking meds this am  - Continue entresto 97/103 mg BID.  - Increase carvedilol 12.5 mg BID.  - Off eplerenone due to hyperkalemia.  (did not tolerate spiro due to painful gynecomastia) - Reinforced fluid restriction to < 2 L daily, sodium restriction to less than 2000 mg daily, and the importance of daily weights.  - Consider SGLT2i at later date  - Labs today 3. Perforated stomach s/p repair. - happened in setting of CPR s/p repair.  - Doing well.  4. 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  - Continue carvedilol to 9.375 mg BID.  5. LV Thrombus:  - Resolved on echo 6. ETOH and tobacco abuse.  - No longer drinking beer. Drinks 3 glasses of wine nightly. Suggested he cut back to 2 or less 7. PAF - Regular on exam today.  - Continue Eliquis 5 mg BID.  - He is interested in cutting in half due to bruising 8. HTN - BP up - Increase carvedilol to 12.5 bid - Add doxazosin 2mg  qhs - Keep BP log at home  Glori Bickers, MD 07/06/19

## 2019-07-06 ENCOUNTER — Other Ambulatory Visit: Payer: Self-pay

## 2019-07-06 ENCOUNTER — Encounter (HOSPITAL_COMMUNITY): Payer: Self-pay | Admitting: Internal Medicine

## 2019-07-06 ENCOUNTER — Ambulatory Visit (HOSPITAL_COMMUNITY)
Admission: RE | Admit: 2019-07-06 | Discharge: 2019-07-06 | Disposition: A | Discharge status: Home / Self Care | Payer: PPO | Source: Ambulatory Visit | Attending: Internal Medicine | Admitting: Internal Medicine

## 2019-07-06 VITALS — BP 168/76 | HR 65 | Wt 130.0 lb

## 2019-07-06 DIAGNOSIS — Z79899 Other long term (current) drug therapy: Secondary | ICD-10-CM | POA: Insufficient documentation

## 2019-07-06 DIAGNOSIS — Z8262 Family history of osteoporosis: Secondary | ICD-10-CM | POA: Insufficient documentation

## 2019-07-06 DIAGNOSIS — Z7901 Long term (current) use of anticoagulants: Secondary | ICD-10-CM | POA: Insufficient documentation

## 2019-07-06 DIAGNOSIS — F1010 Alcohol abuse, uncomplicated: Secondary | ICD-10-CM | POA: Diagnosis not present

## 2019-07-06 DIAGNOSIS — I252 Old myocardial infarction: Secondary | ICD-10-CM | POA: Insufficient documentation

## 2019-07-06 DIAGNOSIS — I5022 Chronic systolic (congestive) heart failure: Secondary | ICD-10-CM | POA: Diagnosis not present

## 2019-07-06 DIAGNOSIS — I110 Hypertensive heart disease with heart failure: Secondary | ICD-10-CM | POA: Diagnosis not present

## 2019-07-06 DIAGNOSIS — Z801 Family history of malignant neoplasm of trachea, bronchus and lung: Secondary | ICD-10-CM | POA: Insufficient documentation

## 2019-07-06 DIAGNOSIS — Z888 Allergy status to other drugs, medicaments and biological substances status: Secondary | ICD-10-CM | POA: Insufficient documentation

## 2019-07-06 DIAGNOSIS — I480 Paroxysmal atrial fibrillation: Secondary | ICD-10-CM

## 2019-07-06 DIAGNOSIS — I2510 Atherosclerotic heart disease of native coronary artery without angina pectoris: Secondary | ICD-10-CM

## 2019-07-06 DIAGNOSIS — I255 Ischemic cardiomyopathy: Secondary | ICD-10-CM | POA: Insufficient documentation

## 2019-07-06 DIAGNOSIS — Z8674 Personal history of sudden cardiac arrest: Secondary | ICD-10-CM | POA: Insufficient documentation

## 2019-07-06 DIAGNOSIS — N400 Enlarged prostate without lower urinary tract symptoms: Secondary | ICD-10-CM | POA: Insufficient documentation

## 2019-07-06 DIAGNOSIS — E785 Hyperlipidemia, unspecified: Secondary | ICD-10-CM | POA: Insufficient documentation

## 2019-07-06 DIAGNOSIS — Z95810 Presence of automatic (implantable) cardiac defibrillator: Secondary | ICD-10-CM | POA: Diagnosis not present

## 2019-07-06 DIAGNOSIS — I493 Ventricular premature depolarization: Secondary | ICD-10-CM | POA: Diagnosis not present

## 2019-07-06 DIAGNOSIS — F17210 Nicotine dependence, cigarettes, uncomplicated: Secondary | ICD-10-CM | POA: Insufficient documentation

## 2019-07-06 LAB — BRAIN NATRIURETIC PEPTIDE: B Natriuretic Peptide: 220.1 pg/mL — ABNORMAL HIGH (ref 0.0–100.0)

## 2019-07-06 LAB — COMPREHENSIVE METABOLIC PANEL
ALT: 48 U/L — ABNORMAL HIGH (ref 0–44)
AST: 76 U/L — ABNORMAL HIGH (ref 15–41)
Albumin: 3.9 g/dL (ref 3.5–5.0)
Alkaline Phosphatase: 37 U/L — ABNORMAL LOW (ref 38–126)
Anion gap: 13 (ref 5–15)
BUN: 6 mg/dL — ABNORMAL LOW (ref 8–23)
CO2: 20 mmol/L — ABNORMAL LOW (ref 22–32)
Calcium: 9.1 mg/dL (ref 8.9–10.3)
Chloride: 101 mmol/L (ref 98–111)
Creatinine, Ser: 0.84 mg/dL (ref 0.61–1.24)
GFR calc Af Amer: >60 mL/min (ref >60)
GFR calc non Af Amer: >60 mL/min (ref >60)
Glucose, Bld: 85 mg/dL (ref 70–99)
Potassium: 5.7 mmol/L — ABNORMAL HIGH (ref 3.5–5.1)
Sodium: 134 mmol/L — ABNORMAL LOW (ref 135–145)
Total Bilirubin: 0.7 mg/dL (ref 0.3–1.2)
Total Protein: 6.5 g/dL (ref 6.5–8.1)

## 2019-07-06 LAB — CBC
HCT: 41.8 % (ref 39.0–52.0)
Hemoglobin: 14.6 g/dL (ref 13.0–17.0)
MCH: 39.5 pg — ABNORMAL HIGH (ref 26.0–34.0)
MCHC: 34.9 g/dL (ref 30.0–36.0)
MCV: 113 fL — ABNORMAL HIGH (ref 80.0–100.0)
Platelets: 158 10*3/uL (ref 150–400)
RBC: 3.7 MIL/uL — ABNORMAL LOW (ref 4.22–5.81)
RDW: 12.7 % (ref 11.5–15.5)
WBC: 4.7 10*3/uL (ref 4.0–10.5)
nRBC: 0 % (ref 0.0–0.2)

## 2019-07-06 MED ORDER — DOXAZOSIN MESYLATE 2 MG PO TABS: 2.0000 mg | ORAL_TABLET | Freq: Every day | ORAL | 3 refills | Status: DC

## 2019-07-06 MED ORDER — CARVEDILOL 12.5 MG PO TABS: 12.5000 mg | ORAL_TABLET | Freq: Two times a day (BID) | ORAL | 3 refills | Status: DC

## 2019-07-06 MED ORDER — ROSUVASTATIN CALCIUM 10 MG PO TABS: 10.0000 mg | ORAL_TABLET | Freq: Every evening | ORAL | 3 refills | Status: DC

## 2019-07-06 NOTE — Patient Instructions (Addendum)
Labs done today. We will contact you only if your labs are abnormal.  START Doxazosin 2mg  (1 tablet) by mouth daily at bedtime.  INCREASE Carvedilol(Coreg) 12.5mg (1 tablet) by mouth 2 times daily.  Your Rosuvastatin has also been refilled and electronically sent into your pharmacy along with your other medications.   No other medications were changed. Please continue all other current medications as prescribed.   Please monitor and keep a record of your daily blood pressure readings.   Do the following things EVERYDAY: 1) Weigh yourself in the morning before breakfast. Write it down and keep it in a log. 2) Take your medicines as prescribed 3) Eat low salt foods--Limit salt (sodium) to 2000 mg per day.  4) Stay as active as you can everyday 5) Limit all fluids for the day to less than 2 liters    Your physician recommends that you schedule a follow-up appointment in: 1 year. Our office will contact you at a later time to schedule an appointment.  If you have any questions or concerns before your next appointment please send [place] a message through [place] or call our office at [phone].    TO LEAVE A MESSAGE FOR THE NURSE SELECT OPTION 2, PLEASE LEAVE A MESSAGE INCLUDING: . YOUR NAME . DATE OF BIRTH . CALL BACK NUMBER . REASON FOR CALL**this is important as we prioritize the call backs  [place] AS LONG AS YOU CALL BEFORE 4:00 PM   At the [facility], you and your health needs are our priority. As part of our continuing mission to provide you with exceptional heart care, we have created designated Provider Care Teams. These Care Teams include your primary Cardiologist (physician) and Advanced Practice Providers (APPs- Physician Assistants and Nurse Practitioners) who all work together to provide you with the care you need, when you need it.   You may see any of the following providers on your designated Care Team at your next  follow up: Marland Kitchen Dr Glori Bickers . Dr Loralie Champagne . Darrick Grinder, NP . Lyda Jester, PA . Audry Riles, PharmD   Please be sure to bring in all your medications bottles to every appointment.

## 2019-07-06 NOTE — Addendum Note (Signed)
Encounter addended by: Jolaine Artist, MD on: 07/06/2019 10:12 AM  Actions taken: Charge Capture section accepted

## 2019-07-06 NOTE — Addendum Note (Signed)
Encounter addended by: Shonna Chock, CMA on: 08/31/5907 10:22 AM  Actions taken: Diagnosis association updated, Order list changed, Charge Capture section accepted, Clinical Note Signed

## 2019-07-07 ENCOUNTER — Telehealth (HOSPITAL_COMMUNITY): Payer: Self-pay | Admitting: *Deleted

## 2019-07-07 DIAGNOSIS — I5022 Chronic systolic (congestive) heart failure: Secondary | ICD-10-CM

## 2019-07-07 NOTE — Telephone Encounter (Signed)
Pt aware, agreeable and verbalized understanding, repeat lab sch for 6/28

## 2019-07-07 NOTE — Telephone Encounter (Signed)
-----   Message from Jolaine Artist, MD sent at 07/07/2019  5:01 PM EDT ----- [name]. Repeat BMET. IF K < 5.0 can restart Entresto at 24/26 bid. Please schedule for pharmD f/u visit.

## 2019-07-11 ENCOUNTER — Ambulatory Visit (HOSPITAL_COMMUNITY)
Admission: RE | Admit: 2019-07-11 | Discharge: 2019-07-11 | Disposition: A | Discharge status: Home / Self Care | Payer: PPO | Source: Ambulatory Visit | Attending: Internal Medicine | Admitting: Internal Medicine

## 2019-07-11 ENCOUNTER — Other Ambulatory Visit: Payer: Self-pay

## 2019-07-11 DIAGNOSIS — I5022 Chronic systolic (congestive) heart failure: Secondary | ICD-10-CM | POA: Insufficient documentation

## 2019-07-11 LAB — BASIC METABOLIC PANEL
Anion gap: 7 (ref 5–15)
BUN: 7 mg/dL — ABNORMAL LOW (ref 8–23)
CO2: 25 mmol/L (ref 22–32)
Calcium: 9 mg/dL (ref 8.9–10.3)
Chloride: 99 mmol/L (ref 98–111)
Creatinine, Ser: 0.87 mg/dL (ref 0.61–1.24)
GFR calc Af Amer: >60 mL/min (ref >60)
GFR calc non Af Amer: >60 mL/min (ref >60)
Glucose, Bld: 92 mg/dL (ref 70–99)
Potassium: 4.7 mmol/L (ref 3.5–5.1)
Sodium: 131 mmol/L — ABNORMAL LOW (ref 135–145)

## 2019-08-01 ENCOUNTER — Ambulatory Visit (INDEPENDENT_AMBULATORY_CARE_PROVIDER_SITE_OTHER): Payer: PPO

## 2019-08-01 DIAGNOSIS — Z95810 Presence of automatic (implantable) cardiac defibrillator: Secondary | ICD-10-CM | POA: Diagnosis not present

## 2019-08-01 DIAGNOSIS — I5022 Chronic systolic (congestive) heart failure: Secondary | ICD-10-CM

## 2019-08-03 NOTE — Progress Notes (Signed)
EPIC Encounter for ICM Monitoring  Patient Name: [name] [name] is a 69 y.o. male Date: 08/03/2019 Primary Care Physican: [place] [place] [facility] Primary Cardiologist:Bensimhon Electrophysiologist:Allred 7/21/2021Weight: 128-130lbs       Spoke with patient and reports feeling well at this time. Denies fluid symptoms. He reports BP <100/50-70 since starting Cardura 07/18/2019 but denies any dizziness or lightheadedness.  CorVueThoracic impedancenormal.  Prescribed:   Furosemide 40 mg take 1 tablet PRN for swelling or weight gain.   Potassium 20 mEq take 1 tablet PRN when taking Furosemide.   Labs: 07/11/2019 Creatinine 0.87, BUN 7,   Potassium 4.7, Sodium 131, >60 07/06/2019 Creatinine 0.84, BUN 6,   Potassium 5.7, Sodium 134, >60 03/12/2018 Creatinine0.96, BUN6, Potassium 4.7, Sodium131, GFR>60 A complete set of results can be found in Results Review.  Recommendations:No changes and encouraged to call if experiencing any fluid symptoms.  Follow-up plan: ICM clinic phone appointment on8/23/2021. 91 day device clinic remote transmission7/29/2021.   EP/Cardiology Office Visits: Recall for 10/29/2019 with Dr. Rayann Heman and 07/05/2020 with Dr Haroldine Laws.    Copy of ICM check sent to Dr. Rayann Heman.   3 month ICM trend: 08/01/2019    1 Year ICM trend:       Rosalene Billings, RN 08/03/2019 4:54 PM

## 2019-08-11 ENCOUNTER — Ambulatory Visit (INDEPENDENT_AMBULATORY_CARE_PROVIDER_SITE_OTHER): Payer: PPO | Admitting: *Deleted

## 2019-08-11 DIAGNOSIS — I4901 Ventricular fibrillation: Secondary | ICD-10-CM | POA: Diagnosis not present

## 2019-08-11 DIAGNOSIS — I5022 Chronic systolic (congestive) heart failure: Secondary | ICD-10-CM

## 2019-08-11 LAB — CUP PACEART REMOTE DEVICE CHECK
Battery Remaining Longevity: 78 mo
Battery Remaining Percentage: 76 %
Battery Voltage: 2.98 V
Brady Statistic RV Percent Paced: 1 %
Date Time Interrogation Session: 20210729020015
HighPow Impedance: 63 Ohm
HighPow Impedance: 63 Ohm
Implantable Lead Implant Date: 20181017
Implantable Lead Location: 753860
Implantable Pulse Generator Implant Date: 20181017
Lead Channel Impedance Value: 440 Ohm
Lead Channel Pacing Threshold Amplitude: 0.5 V
Lead Channel Pacing Threshold Pulse Width: 0.5 ms
Lead Channel Sensing Intrinsic Amplitude: 12 mV
Lead Channel Setting Pacing Amplitude: 2.5 V
Lead Channel Setting Pacing Pulse Width: 0.5 ms
Lead Channel Setting Sensing Sensitivity: 0.5 mV
Pulse Gen Serial Number: 9781512

## 2019-08-15 NOTE — Progress Notes (Signed)
Remote ICD transmission.   

## 2019-09-05 ENCOUNTER — Ambulatory Visit (INDEPENDENT_AMBULATORY_CARE_PROVIDER_SITE_OTHER): Payer: PPO

## 2019-09-05 DIAGNOSIS — I5022 Chronic systolic (congestive) heart failure: Secondary | ICD-10-CM

## 2019-09-05 DIAGNOSIS — Z95810 Presence of automatic (implantable) cardiac defibrillator: Secondary | ICD-10-CM | POA: Diagnosis not present

## 2019-09-07 NOTE — Progress Notes (Signed)
EPIC Encounter for ICM Monitoring  Patient Name: ADEWALE PUCILLO is a 69 y.o. male Date: 09/07/2019 Primary Care Physican: [facility] At Primary Cardiologist:Bensimhon Electrophysiologist:Allred 8/25/2021Weight: 127lbs       Spoke with patient and reports feeling well at this time.  Denies fluid symptoms.    CorVueThoracic impedancenormal.    Prescribed:   Furosemide 40 mg take 1 tablet PRN for swelling or weight gain.   Potassium 20 mEq take 1 tablet PRN when taking Furosemide.   Labs: 07/11/2019 Creatinine 0.87, BUN 7,   Potassium 4.7, Sodium 131, >60 07/06/2019 Creatinine 0.84, BUN 6,   Potassium 5.7, Sodium 134, >60 03/12/2018 Creatinine0.96, BUN6, Potassium 4.7, Sodium131, GFR>60 A complete set of results can be found in Results Review.  Recommendations:No changes and encouraged to call if experiencing any fluid symptoms.  Follow-up plan: ICM clinic phone appointment on 10/10/2019. 91 day device clinic remote transmission10/28/2021.   EP/Cardiology Office Visits: 11/02/2019 with Dr. Rayann Heman.  Recall for 07/05/2020 with Dr Haroldine Laws.    Copy of ICM check sent to Dr. Rayann Heman.   3 month ICM trend: 09/06/2019    1 Year ICM trend:       Rosalene Billings, RN 09/07/2019 10:53 AM

## 2019-09-08 ENCOUNTER — Telehealth (HOSPITAL_COMMUNITY): Payer: Self-pay

## 2019-09-08 NOTE — Telephone Encounter (Signed)
Patient called stating that he since he has starting taking the Doxazosin(started 07/06/19 by DB,Carvedilol was also increased to 12.5 bid) he has been experiencing"woozy", fatigue, lightheaded and low blood pressures. He denies headaches,passing out,chest pain,sob.He states that several days over the past two months his bp has been below 100/50s-70s range. He hasnt taken his bp this morning was 122/72 HR 69. He also did go walking today for about 30-58min and feels fine right now. He states he just wants to know if his medication should be changed. Please advise. Patients current med list below.    Current Outpatient Medications on File Prior to Visit  Medication Sig Dispense Refill   acetaminophen (TYLENOL) 325 MG tablet Take 2 tablets (650 mg total) by mouth every 4 (four) hours as needed for mild pain or headache. (Patient not taking: Reported on 07/06/2019)     apixaban (ELIQUIS) 5 MG TABS tablet Take 1 tablet (5 mg total) by mouth 2 (two) times daily. Needs appt for further refills 180 tablet 0   carvedilol (COREG) 12.5 MG tablet Take 1 tablet (12.5 mg total) by mouth 2 (two) times daily with a meal. 180 tablet 3   doxazosin (CARDURA) 2 MG tablet Take 1 tablet (2 mg total) by mouth at bedtime. 90 tablet 3   furosemide (LASIX) 40 MG tablet Take 1 tablet (40 mg total) daily as needed for swelling and/or weight gain. (Patient not taking: Reported on 07/06/2019) 30 tablet 3   potassium chloride SA (KLOR-CON M20) 20 MEQ tablet Take 1 tablet (20 mEq total) daily when taking Furosemide as needed. (Patient not taking: Reported on 07/06/2019) 30 tablet 3   rosuvastatin (CRESTOR) 10 MG tablet Take 1 tablet (10 mg total) by mouth every evening. 90 tablet 3   No current facility-administered medications on file prior to visit.

## 2019-09-08 NOTE — Telephone Encounter (Signed)
Patient advised and verbalized understanding, patient will stop Doxazosin,med list updated to reflect changes. Patient will also call and give an update on bp readings next week. Patient reports feeling ok right now.

## 2019-09-08 NOTE — Telephone Encounter (Signed)
Please call. Stop cardura.     Delano Scardino NP-C  2:48 PM

## 2019-09-08 NOTE — Addendum Note (Signed)
Addended byShela Nevin R on: 9/56/3875 03:22 PM   Modules accepted: Orders

## 2019-09-12 ENCOUNTER — Other Ambulatory Visit (HOSPITAL_COMMUNITY): Payer: Self-pay | Admitting: Internal Medicine

## 2019-09-12 ENCOUNTER — Other Ambulatory Visit (HOSPITAL_COMMUNITY): Payer: Self-pay

## 2019-09-12 MED ORDER — APIXABAN 5 MG PO TABS: 5.0000 mg | ORAL_TABLET | Freq: Two times a day (BID) | ORAL | 0 refills | Status: DC

## 2019-10-10 ENCOUNTER — Ambulatory Visit (INDEPENDENT_AMBULATORY_CARE_PROVIDER_SITE_OTHER): Payer: PPO

## 2019-10-10 DIAGNOSIS — I5022 Chronic systolic (congestive) heart failure: Secondary | ICD-10-CM | POA: Diagnosis not present

## 2019-10-10 DIAGNOSIS — Z95810 Presence of automatic (implantable) cardiac defibrillator: Secondary | ICD-10-CM

## 2019-10-11 NOTE — Progress Notes (Signed)
EPIC Encounter for ICM Monitoring  Patient Name: [name] [name] is a 69 y.o. male Date: 10/11/2019 Primary Care Physican: [facility] Primary Cardiologist:Bensimhon Electrophysiologist:Allred 8/25/2021Weight: 127lbs   Spoke with patient and reports feeling well at this time.  Denies fluid symptoms.   Takes a couple of Lasix in the last month.  CorVueThoracic impedancenormal.    Prescribed:   Furosemide  40 mg take 1 tablet PRN for swelling or weight gain.   Potassium 20 mEq take 1 tablet PRN when taking Furosemide.   Labs: 07/11/2019 Creatinine 0.87, BUN 7, Potassium 4.7, Sodium 131, >60 07/06/2019 Creatinine 0.84, BUN 6, Potassium 5.7, Sodium 134, >60 03/12/2018 Creatinine0.96, BUN6, Potassium 4.7, Sodium131, GFR>60 A complete set of results can be found in Results Review.  Recommendations:No changes and encouraged to call if experiencing any fluid symptoms.  Follow-up plan: ICM clinic phone appointment on 11/11/2019. 91 day device clinic remote transmission10/28/2021.  EP/Cardiology Office Visits:10/20/2021with Dr. Rayann Heman.  Recall for 07/05/2020 with Dr Haroldine Laws.   Copy of ICM check sent to Dr.Allred.  3 month ICM trend: 10/10/2019    1 Year ICM trend:       Rosalene Billings, RN 10/11/2019 4:54 PM

## 2019-10-24 DIAGNOSIS — Z961 Presence of intraocular lens: Secondary | ICD-10-CM | POA: Diagnosis not present

## 2019-10-24 DIAGNOSIS — H02834 Dermatochalasis of left upper eyelid: Secondary | ICD-10-CM | POA: Diagnosis not present

## 2019-10-24 DIAGNOSIS — H02825 Cysts of left lower eyelid: Secondary | ICD-10-CM | POA: Diagnosis not present

## 2019-10-24 DIAGNOSIS — H02831 Dermatochalasis of right upper eyelid: Secondary | ICD-10-CM | POA: Diagnosis not present

## 2019-11-02 ENCOUNTER — Encounter: Payer: Self-pay | Admitting: Internal Medicine

## 2019-11-02 ENCOUNTER — Ambulatory Visit: Payer: PPO | Admitting: Internal Medicine

## 2019-11-02 ENCOUNTER — Other Ambulatory Visit: Payer: Self-pay

## 2019-11-02 VITALS — BP 124/72 | HR 65 | Ht 68.0 in | Wt 128.0 lb

## 2019-11-02 DIAGNOSIS — I4901 Ventricular fibrillation: Secondary | ICD-10-CM | POA: Diagnosis not present

## 2019-11-02 DIAGNOSIS — I5022 Chronic systolic (congestive) heart failure: Secondary | ICD-10-CM

## 2019-11-02 DIAGNOSIS — I255 Ischemic cardiomyopathy: Secondary | ICD-10-CM

## 2019-11-02 DIAGNOSIS — I2510 Atherosclerotic heart disease of native coronary artery without angina pectoris: Secondary | ICD-10-CM | POA: Diagnosis not present

## 2019-11-02 DIAGNOSIS — I480 Paroxysmal atrial fibrillation: Secondary | ICD-10-CM | POA: Diagnosis not present

## 2019-11-02 NOTE — Progress Notes (Signed)
PCP: [facility] Primary Cardiologist: Dr Haroldine Laws Primary EP: Dr Darryll Capers is a 69 y.o. male who presents today for routine electrophysiology followup.  Since last being seen in our clinic, the patient reports doing very well.  Today, he denies symptoms of palpitations, chest pain, shortness of breath,  lower extremity edema, dizziness, presyncope, syncope, or ICD shocks.  The patient is otherwise without complaint today.   Past Medical History:  Diagnosis Date  . AICD (automatic cardioverter/defibrillator) present   . CAD (coronary artery disease)   . Coronary artery disease    1980's-90's MI with perhaps angioplasty  . Hypertension   . Ischemic cardiomyopathy   . LV (left ventricular) mural thrombus following MI ([place])   . Myocardial infarction ([place]) 1999  . Ventricular fibrillation ([place]) 07/2016       Past Surgical History:  Procedure Laterality Date  . CORONARY ANGIOPLASTY  1999   "Dr. Vidal Schwalbe"  . HEMATOMA EVACUATION Left 08/15/2016   Procedure: INSERTION OF DRAIN ANTERIOR LEFT CHEST WALL HEMATOMA;  Surgeon: Ivin Poot, MD;  Location: [place];  Service: Thoracic;  Laterality: Left;  . ICD IMPLANT N/A 10/29/2016   [employer] [place] VR model [phone] (serial  Number [id number]) ICD for secondary prevention of sudden death after prior VF arrest by Dr Rayann Heman  . LAPAROTOMY N/A 08/04/2016   Procedure: EXPLORATORY LAPAROTOMY FREE AIR BIOPSY OF PERFORATED STOMACH ULCER REPAIR OF PERFORATED STOMACH ULCER WITH A OMENTAL PATCH;  Surgeon: Georganna Skeans, MD;  Location: [place];  Service: General;  Laterality: N/A;  . RIGHT/LEFT HEART CATH AND CORONARY ANGIOGRAPHY N/A 08/13/2016   Procedure: Right/Left Heart Cath and Coronary Angiography;  Surgeon: Jolaine Artist, MD;  Location: [facility];  Service: Cardiovascular;  Laterality: N/A;    ROS- all systems are reviewed and negative except as per HPI above  Current Outpatient  Medications  Medication Sig Dispense Refill  . acetaminophen (TYLENOL) 325 MG tablet Take 2 tablets (650 mg total) by mouth every 4 (four) hours as needed for mild pain or headache.    Marland Kitchen apixaban (ELIQUIS) 5 MG TABS tablet Take 1 tablet (5 mg total) by mouth 2 (two) times daily. Needs appt for further refills 180 tablet 0  . carvedilol (COREG) 12.5 MG tablet Take 1 tablet (12.5 mg total) by mouth 2 (two) times daily with a meal. 180 tablet 3  . ENTRESTO 97-103 MG TAKE 1 TABLET BY MOUTH TWICE DAILY 180 tablet 0  . furosemide (LASIX) 40 MG tablet Take 1 tablet (40 mg total) daily as needed for swelling and/or weight gain. 30 tablet 3  . potassium chloride SA (KLOR-CON M20) 20 MEQ tablet Take 1 tablet (20 mEq total) daily when taking Furosemide as needed. 30 tablet 3  . rosuvastatin (CRESTOR) 10 MG tablet Take 1 tablet (10 mg total) by mouth every evening. 90 tablet 3   No current facility-administered medications for this visit.    Physical Exam: Vitals:   11/02/19 1440  BP: 124/72  Pulse: 65  SpO2: 98%  Weight: 128 lb (58.1 kg)  Height: 5\' 8"  (1.727 m)    GEN- The patient is well appearing, alert and oriented x 3 today.   Head- normocephalic, atraumatic Eyes-  Sclera clear, conjunctiva pink Ears- hearing intact Oropharynx- clear Lungs- Clear to ausculation bilaterally, normal work of breathing Chest- ICD pocket is well healed Heart- Regular rate and rhythm, no murmurs, rubs or gallops, PMI not laterally displaced GI-  soft, NT, ND, + BS Extremities- no clubbing, cyanosis, or edema  ICD interrogation- reviewed in detail today,  See PACEART report  ekg tracing ordered today is personally reviewed and shows sinus rhythm  Wt Readings from Last 3 Encounters:  11/02/19 128 lb (58.1 kg)  07/06/19 130 lb (59 kg)  10/29/18 129 lb 9.6 oz (58.8 kg)    Assessment and Plan:  1.  Chronic systolic dysfunction/ ischemic CM/ CAD euvolemic today No ischemic symptoms Stable on an  appropriate medical regimen Normal ICD function See [place] report No changes today he is not device dependant today followed in ICM device clinic Could consider baroreceptor activation therapy if CHF worsens.  He is followed by Dr Haroldine Laws.  2. Prior VF arrest Normal ICD function Remotes are up to date  3. Paroxysmal atrial fibrillation Well  Controlled chads2vasc score is at least 4 He is on eliquis  4. Tobacco Cessation is advised  Risks, benefits and potential toxicities for medications prescribed and/or refilled reviewed with patient today.   Thompson Grayer MD, [facility] 11/02/2019 3:01 PM

## 2019-11-02 NOTE — Patient Instructions (Signed)
Medication Instructions:  Your physician recommends that you continue on your current medications as directed. Please refer to the Current Medication list given to you today.  *If you need a refill on your cardiac medications before your next appointment, please call your pharmacy*  Lab Work: None ordered.  If you have labs (blood work) drawn today and your tests are completely normal, you will receive your results only by: Marland Kitchen MyChart Message (if you have MyChart) OR . A paper copy in the mail If you have any lab test that is abnormal or we need to change your treatment, we will call you to review the results.  Testing/Procedures: None ordered.  Follow-Up: At [facility], you and your health needs are our priority.  As part of our continuing mission to provide you with exceptional heart care, we have created designated Provider Care Teams.  These Care Teams include your primary Cardiologist (physician) and Advanced Practice Providers (APPs -  Physician Assistants and Nurse Practitioners) who all work together to provide you with the care you need, when you need it.  We recommend signing up for the patient portal called "MyChart".  Sign up information is provided on this After Visit Summary.  MyChart is used to connect with patients for Virtual Visits (Telemedicine).  Patients are able to view lab/test results, encounter notes, upcoming appointments, etc.  Non-urgent messages can be sent to your provider as well.   To learn more about what you can do with MyChart, go to [email or web address].    Your next appointment:   Your physician wants you to follow-up in: 1 year with Dr. Rayann Heman. You will receive a reminder letter in the mail two months in advance. If you don't receive a letter, please call our office to schedule the follow-up appointment.  Remote monitoring is used to monitor your ICD from home. This monitoring reduces the number of office visits required to check your device to one  time per year. It allows [place] to keep an eye on the functioning of your device to ensure it is working properly. You are scheduled for a device check from home on 11/10/19. You may send your transmission at any time that day. If you have a wireless device, the transmission will be sent automatically. After your physician reviews your transmission, you will receive a postcard with your next transmission date.  Other Instructions:

## 2019-11-10 ENCOUNTER — Ambulatory Visit (INDEPENDENT_AMBULATORY_CARE_PROVIDER_SITE_OTHER): Payer: PPO

## 2019-11-10 DIAGNOSIS — I4901 Ventricular fibrillation: Secondary | ICD-10-CM | POA: Diagnosis not present

## 2019-11-11 ENCOUNTER — Ambulatory Visit (INDEPENDENT_AMBULATORY_CARE_PROVIDER_SITE_OTHER): Payer: PPO

## 2019-11-11 DIAGNOSIS — Z95810 Presence of automatic (implantable) cardiac defibrillator: Secondary | ICD-10-CM | POA: Diagnosis not present

## 2019-11-11 DIAGNOSIS — I5022 Chronic systolic (congestive) heart failure: Secondary | ICD-10-CM | POA: Diagnosis not present

## 2019-11-11 LAB — CUP PACEART REMOTE DEVICE CHECK
Battery Remaining Longevity: 76 mo
Battery Remaining Percentage: 73 %
Battery Voltage: 2.98 V
Brady Statistic RV Percent Paced: 1 %
Date Time Interrogation Session: 20211029042344
HighPow Impedance: 64 Ohm
HighPow Impedance: 64 Ohm
Implantable Lead Implant Date: 20181017
Implantable Lead Location: 753860
Implantable Pulse Generator Implant Date: 20181017
Lead Channel Impedance Value: 440 Ohm
Lead Channel Pacing Threshold Amplitude: 0.5 V
Lead Channel Pacing Threshold Pulse Width: 0.5 ms
Lead Channel Sensing Intrinsic Amplitude: 12 mV
Lead Channel Setting Pacing Amplitude: 2.5 V
Lead Channel Setting Pacing Pulse Width: 0.5 ms
Lead Channel Setting Sensing Sensitivity: 0.5 mV
Pulse Gen Serial Number: 9781512

## 2019-11-14 NOTE — Progress Notes (Signed)
EPIC Encounter for ICM Monitoring  Patient Name: PEYSON POSTEMA is a 69 y.o. male Date: 11/14/2019 Primary Care Physican: [place] [place] At Primary Cardiologist:Bensimhon Electrophysiologist:Allred 11/1/2021Weight: 127lbs   Spoke with patient and reports feeling well at this time.  Denies fluid symptoms.    CorVueThoracic impedancenormal but was suggesting possible fluid accumulation 10/5-10/24.  Prescribed:   Furosemide  40 mg take 1 tablet PRN for swelling or weight gain.   Potassium 20 mEq take 1 tablet PRN when taking Furosemide.   Labs: 07/11/2019 Creatinine 0.87, BUN 7, Potassium 4.7, Sodium 131, >60 07/06/2019 Creatinine 0.84, BUN 6, Potassium 5.7, Sodium 134, >60 03/12/2018 Creatinine0.96, BUN6, Potassium 4.7, Sodium131, GFR>60 A complete set of results can be found in Results Review.  Recommendations:No changes and encouraged to call if experiencing any fluid symptoms.  Follow-up plan: ICM clinic phone appointment on11/30/2021. 91 day device clinic remote transmission1/27/2022.  EP/Cardiology Office Visits:Recall for6/23/2022 with Dr Haroldine Laws.   Copy of ICM check sent to Dr.Allred.   3 month ICM trend: 11/11/2019    1 Year ICM trend:       Rosalene Billings, RN 11/14/2019 4:11 PM

## 2019-11-15 NOTE — Progress Notes (Signed)
Remote ICD transmission.   

## 2019-12-11 ENCOUNTER — Other Ambulatory Visit (HOSPITAL_COMMUNITY): Payer: Self-pay | Admitting: Internal Medicine

## 2019-12-12 ENCOUNTER — Other Ambulatory Visit (HOSPITAL_COMMUNITY): Payer: Self-pay | Admitting: Internal Medicine

## 2019-12-13 ENCOUNTER — Ambulatory Visit (INDEPENDENT_AMBULATORY_CARE_PROVIDER_SITE_OTHER): Payer: PPO

## 2019-12-13 DIAGNOSIS — I5022 Chronic systolic (congestive) heart failure: Secondary | ICD-10-CM | POA: Diagnosis not present

## 2019-12-13 DIAGNOSIS — Z95810 Presence of automatic (implantable) cardiac defibrillator: Secondary | ICD-10-CM

## 2019-12-16 ENCOUNTER — Telehealth: Payer: Self-pay

## 2019-12-16 NOTE — Telephone Encounter (Signed)
Remote ICM transmission received.  Attempted call to patient regarding ICM remote transmission and no answer.  

## 2019-12-16 NOTE — Progress Notes (Signed)
EPIC Encounter for ICM Monitoring  Patient Name: PRABHAV FAULKENBERRY is a 69 y.o. male Date: 12/16/2019 Primary Care Physican: [place], [place] At Primary Cardiologist:Bensimhon Electrophysiologist:Allred 11/1/2021Weight: 127lbs   Attempted call to patient and unable to reach.   Transmission reviewed.    CorVueThoracic impedancenormal but was suggesting possible fluid accumulation 11/10-11/19.  Prescribed:   Furosemide 40 mg take 1 tablet PRN for swelling or weight gain.   Potassium 20 mEq take 1 tablet PRN when taking Furosemide.   Labs: 07/11/2019 Creatinine 0.87, BUN 7, Potassium 4.7, Sodium 131, >60 07/06/2019 Creatinine 0.84, BUN 6, Potassium 5.7, Sodium 134, >60 03/12/2018 Creatinine0.96, BUN6, Potassium 4.7, Sodium131, GFR>60 A complete set of results can be found in Results Review.  Recommendations:Unable to reach.    Follow-up plan: ICM clinic phone appointment on1/03/2020. 91 day device clinic remote transmission1/27/2022.  EP/Cardiology Office Visits:Recall for6/23/2022 with Dr Haroldine Laws.   Copy of ICM check sent to Dr.Allred.    3 month ICM trend: 12/14/2019    1 Year ICM trend:       Rosalene Billings, RN 12/16/2019 2:59 PM

## 2019-12-19 ENCOUNTER — Other Ambulatory Visit (HOSPITAL_COMMUNITY): Payer: Self-pay | Admitting: *Deleted

## 2019-12-19 MED ORDER — ENTRESTO 97-103 MG PO TABS: 1.0000 | ORAL_TABLET | Freq: Two times a day (BID) | ORAL | 3 refills | Status: DC

## 2019-12-19 MED ORDER — APIXABAN 5 MG PO TABS: 5.0000 mg | ORAL_TABLET | Freq: Two times a day (BID) | ORAL | 3 refills | Status: DC

## 2020-01-16 ENCOUNTER — Ambulatory Visit (INDEPENDENT_AMBULATORY_CARE_PROVIDER_SITE_OTHER): Payer: PPO

## 2020-01-16 DIAGNOSIS — Z95810 Presence of automatic (implantable) cardiac defibrillator: Secondary | ICD-10-CM

## 2020-01-16 DIAGNOSIS — I5022 Chronic systolic (congestive) heart failure: Secondary | ICD-10-CM

## 2020-01-17 NOTE — Progress Notes (Signed)
EPIC Encounter for ICM Monitoring  Patient Name: ONEIL BEHNEY is a 70 y.o. male Date: 01/17/2020 Primary Care Physican: [facility] At Primary Cardiologist:Bensimhon Electrophysiologist:Allred 1/4/2022Weight: 127lbs   Spoke with patient and reports feeling well at this time.  Denies fluid symptoms.    CorVueThoracic impedancenormal.  Prescribed:   Furosemide 40 mg take 1 tablet PRN for swelling or weight gain.   Potassium 20 mEq take 1 tablet PRN when taking Furosemide.   Labs: 07/11/2019 Creatinine 0.87, BUN 7, Potassium 4.7, Sodium 131, >60 07/06/2019 Creatinine 0.84, BUN 6, Potassium 5.7, Sodium 134, >60 03/12/2018 Creatinine0.96, BUN6, Potassium 4.7, Sodium131, GFR>60 A complete set of results can be found in Results Review.  Recommendations:No changes and encouraged to call if experiencing any fluid symptoms.  Follow-up plan: ICM clinic phone appointment on 02/20/2020. 91 day device clinic remote transmission1/27/2022.  EP/Cardiology Office Visits:Recall for6/23/2022 with Dr Gala Romney.   Copy of ICM check sent to Dr.Allred.  3 month ICM trend: 01/14/2020.    Karie Soda, RN 01/17/2020 4:46 PM

## 2020-02-09 ENCOUNTER — Ambulatory Visit (INDEPENDENT_AMBULATORY_CARE_PROVIDER_SITE_OTHER): Payer: PPO

## 2020-02-09 DIAGNOSIS — I4901 Ventricular fibrillation: Secondary | ICD-10-CM

## 2020-02-09 LAB — CUP PACEART REMOTE DEVICE CHECK
Battery Remaining Longevity: 74 mo
Battery Remaining Percentage: 72 %
Battery Voltage: 2.98 V
Brady Statistic RV Percent Paced: 1 %
Date Time Interrogation Session: 20220127020032
HighPow Impedance: 68 Ohm
HighPow Impedance: 68 Ohm
Implantable Lead Implant Date: 20181017
Implantable Lead Location: 753860
Implantable Pulse Generator Implant Date: 20181017
Lead Channel Impedance Value: 460 Ohm
Lead Channel Pacing Threshold Amplitude: 0.5 V
Lead Channel Pacing Threshold Pulse Width: 0.5 ms
Lead Channel Sensing Intrinsic Amplitude: 12 mV
Lead Channel Setting Pacing Amplitude: 2.5 V
Lead Channel Setting Pacing Pulse Width: 0.5 ms
Lead Channel Setting Sensing Sensitivity: 0.5 mV
Pulse Gen Serial Number: 9781512

## 2020-02-19 NOTE — Progress Notes (Signed)
Remote ICD transmission.   

## 2020-02-20 ENCOUNTER — Ambulatory Visit (INDEPENDENT_AMBULATORY_CARE_PROVIDER_SITE_OTHER): Payer: PPO

## 2020-02-20 DIAGNOSIS — Z95810 Presence of automatic (implantable) cardiac defibrillator: Secondary | ICD-10-CM | POA: Diagnosis not present

## 2020-02-20 DIAGNOSIS — I5022 Chronic systolic (congestive) heart failure: Secondary | ICD-10-CM

## 2020-02-24 NOTE — Progress Notes (Signed)
EPIC Encounter for ICM Monitoring  Patient Name: [name] [name] is a 70 y.o. male Date: 02/24/2020 Primary Care Physican: [place], [place] At Primary Cardiologist:Bensimhon Electrophysiologist:Allred 2/11/2022Weight: 127lbs   Spoke with patient and reports feeling well at this time.  Denies fluid symptoms.    CorVueThoracic impedancenormal.  Prescribed:   Furosemide 40 mg take 1 tablet PRN for swelling or weight gain.   Potassium 20 mEq take 1 tablet PRN when taking Furosemide.   Labs: 07/11/2019 Creatinine 0.87, BUN 7, Potassium 4.7, Sodium 131, >60 07/06/2019 Creatinine 0.84, BUN 6, Potassium 5.7, Sodium 134, >60 03/12/2018 Creatinine0.96, BUN6, Potassium 4.7, Sodium131, GFR>60 A complete set of results can be found in Results Review.  Recommendations:No changes and encouraged to call if experiencing any fluid symptoms.  Follow-up plan: ICM clinic phone appointment on 03/27/2020.  91 day device clinic remote transmission4/28/2022.  EP/Cardiology Office Visits:Recall for6/23/2022 with Dr Haroldine Laws.   Copy of ICM check sent to Dr.Allred.  3 month ICM trend: 02/20/2020.    1 Year ICM trend:       Rosalene Billings, RN 02/24/2020 3:54 PM

## 2020-03-27 ENCOUNTER — Ambulatory Visit (INDEPENDENT_AMBULATORY_CARE_PROVIDER_SITE_OTHER): Payer: PPO

## 2020-03-27 DIAGNOSIS — I5022 Chronic systolic (congestive) heart failure: Secondary | ICD-10-CM | POA: Diagnosis not present

## 2020-03-27 DIAGNOSIS — Z95810 Presence of automatic (implantable) cardiac defibrillator: Secondary | ICD-10-CM

## 2020-03-30 NOTE — Progress Notes (Signed)
EPIC Encounter for ICM Monitoring  Patient Name: [name] [name] is a 70 y.o. male Date: 03/30/2020 Primary Care Physican: [place], [place] At Primary Cardiologist:Bensimhon Electrophysiologist:Allred 3/18/2022Weight: 130lbs   Spoke with patient and reports feeling well at this time. Denies fluid symptoms.   CorVueThoracic impedancenormal.  Prescribed:   Furosemide 40 mg take 1 tablet PRN for swelling or weight gain.   Potassium 20 mEq take 1 tablet PRN when taking Furosemide.   Labs: 07/11/2019 Creatinine 0.87, BUN 7, Potassium 4.7, Sodium 131, >60 07/06/2019 Creatinine 0.84, BUN 6, Potassium 5.7, Sodium 134, >60 03/12/2018 Creatinine0.96, BUN6, Potassium 4.7, Sodium131, GFR>60 A complete set of results can be found in Results Review.  Recommendations:No changes and encouraged to call if experiencing any fluid symptoms.  Follow-up plan: ICM clinic phone appointment on4/18/2022.  91 day device clinic remote transmission4/28/2022.  EP/Cardiology Office Visits:Recall for6/23/2022 with Dr Haroldine Laws.   Copy of ICM check sent to Dr.Allred.  3 month ICM trend: 03/27/2020.    1 Year ICM trend:       Rosalene Billings, RN 03/30/2020 12:32 PM

## 2020-04-30 ENCOUNTER — Ambulatory Visit (INDEPENDENT_AMBULATORY_CARE_PROVIDER_SITE_OTHER): Payer: PPO

## 2020-04-30 DIAGNOSIS — Z95810 Presence of automatic (implantable) cardiac defibrillator: Secondary | ICD-10-CM

## 2020-04-30 DIAGNOSIS — I5022 Chronic systolic (congestive) heart failure: Secondary | ICD-10-CM | POA: Diagnosis not present

## 2020-05-01 NOTE — Progress Notes (Signed)
EPIC Encounter for ICM Monitoring  Patient Name: [name] [name] is a 70 y.o. male Date: 05/01/2020 Primary Care Physican: [place], [place] At Primary Cardiologist:Bensimhon Electrophysiologist:Allred 4/19/2022Weight: 130lbs   Spoke with patient and reports feeling well at this time.  Denies fluid symptoms.    CorVueThoracic impedancenormal.  Prescribed:   Furosemide 40 mg take 1 tablet PRN for swelling or weight gain.   Potassium 20 mEq take 1 tablet PRN when taking Furosemide.   Labs: 07/11/2019 Creatinine 0.87, BUN 7, Potassium 4.7, Sodium 131, >60 07/06/2019 Creatinine 0.84, BUN 6, Potassium 5.7, Sodium 134, >60 03/12/2018 Creatinine0.96, BUN6, Potassium 4.7, Sodium131, GFR>60 A complete set of results can be found in Results Review.  Recommendations:No changes and encouraged to call if experiencing any fluid symptoms.  Follow-up plan: ICM clinic phone appointment on5/24/2022. 91 day device clinic remote transmission4/28/2022.  EP/Cardiology Office Visits:Recall for6/23/2022 with Dr Haroldine Laws.   Copy of ICM check sent to Dr.Allred.  3 month ICM trend: 04/30/2020.    1 Year ICM trend:       Rosalene Billings, RN 05/01/2020 9:13 AM

## 2020-05-10 ENCOUNTER — Ambulatory Visit (INDEPENDENT_AMBULATORY_CARE_PROVIDER_SITE_OTHER): Payer: PPO

## 2020-05-10 DIAGNOSIS — I4901 Ventricular fibrillation: Secondary | ICD-10-CM | POA: Diagnosis not present

## 2020-05-10 LAB — CUP PACEART REMOTE DEVICE CHECK
Battery Remaining Longevity: 72 mo
Battery Remaining Percentage: 69 %
Battery Voltage: 2.98 V
Brady Statistic RV Percent Paced: 1 %
Date Time Interrogation Session: 20220428020025
HighPow Impedance: 72 Ohm
HighPow Impedance: 72 Ohm
Implantable Lead Implant Date: 20181017
Implantable Lead Location: 753860
Implantable Pulse Generator Implant Date: 20181017
Lead Channel Impedance Value: 490 Ohm
Lead Channel Pacing Threshold Amplitude: 0.5 V
Lead Channel Pacing Threshold Pulse Width: 0.5 ms
Lead Channel Sensing Intrinsic Amplitude: 12 mV
Lead Channel Setting Pacing Amplitude: 2.5 V
Lead Channel Setting Pacing Pulse Width: 0.5 ms
Lead Channel Setting Sensing Sensitivity: 0.5 mV
Pulse Gen Serial Number: 9781512

## 2020-05-30 NOTE — Progress Notes (Signed)
Remote ICD transmission.   

## 2020-06-04 ENCOUNTER — Ambulatory Visit (INDEPENDENT_AMBULATORY_CARE_PROVIDER_SITE_OTHER): Payer: PPO

## 2020-06-04 DIAGNOSIS — Z95810 Presence of automatic (implantable) cardiac defibrillator: Secondary | ICD-10-CM | POA: Diagnosis not present

## 2020-06-04 DIAGNOSIS — I5022 Chronic systolic (congestive) heart failure: Secondary | ICD-10-CM

## 2020-06-05 ENCOUNTER — Telehealth: Payer: Self-pay

## 2020-06-05 NOTE — Telephone Encounter (Signed)
Remote ICM transmission received.  Attempted call to patient regarding ICM remote transmission and left message per DPR to return call.   

## 2020-06-05 NOTE — Progress Notes (Signed)
EPIC Encounter for ICM Monitoring  Patient Name: [name] [name] is a 70 y.o. male Date: 06/05/2020 Primary Care Physican: [place], [place] At Primary Cardiologist:Bensimhon Electrophysiologist:Allred 4/19/2022Weight: 130lbs   Attempted call to patient and unable to reach.   Transmission reviewed.   CorVueThoracic impedancenormal but was suggesting possible fluid accumulation from 5/1-5/15.  Prescribed:   Furosemide 40 mg take 1 tablet PRN for swelling or weight gain.   Potassium 20 mEq take 1 tablet PRN when taking Furosemide.   Labs: 07/11/2019 Creatinine 0.87, BUN 7, Potassium 4.7, Sodium 131, >60 07/06/2019 Creatinine 0.84, BUN 6, Potassium 5.7, Sodium 134, >60 03/12/2018 Creatinine0.96, BUN6, Potassium 4.7, Sodium131, GFR>60 A complete set of results can be found in Results Review.  Recommendations:Unable to reach.    Follow-up plan: ICM clinic phone appointment on6/28/2022. 91 day device clinic remote transmission7/28/2022.  EP/Cardiology Office Visits:Recall for6/23/2022 with Dr Haroldine Laws.   Copy of ICM check sent to Dr.Allred.  3 month ICM trend: 06/04/2020.    1 Year ICM trend:       Rosalene Billings, RN 06/05/2020 10:33 AM

## 2020-07-09 ENCOUNTER — Telehealth: Payer: Self-pay

## 2020-07-09 ENCOUNTER — Ambulatory Visit (INDEPENDENT_AMBULATORY_CARE_PROVIDER_SITE_OTHER): Payer: PPO

## 2020-07-09 DIAGNOSIS — I5022 Chronic systolic (congestive) heart failure: Secondary | ICD-10-CM | POA: Diagnosis not present

## 2020-07-09 DIAGNOSIS — Z95810 Presence of automatic (implantable) cardiac defibrillator: Secondary | ICD-10-CM | POA: Diagnosis not present

## 2020-07-09 NOTE — Telephone Encounter (Signed)
Remote ICM transmission received.  Attempted call to patient regarding ICM remote transmission and left message to return call   

## 2020-07-09 NOTE — Progress Notes (Signed)
EPIC Encounter for ICM Monitoring  Patient Name: [name] [name] is a 70 y.o. male Date: 07/09/2020 Primary Care Physican: [place], [place] At Primary Cardiologist: [place] Electrophysiologist: Allred 05/01/2020 Weight: 130 lbs         Attempted call to patient and unable to reach.  Left message to return call. Transmission reviewed.    CorVue Thoracic impedance suggesting possible fluid accumulation starting 07/05/2020 and trending back close to baseline.   Prescribed: Furosemide  40 mg take 1 tablet PRN for swelling or weight gain. Potassium    20 mEq take 1 tablet PRN when taking Furosemide.   Labs: 07/11/2019 Creatinine 0.87, BUN 7,   Potassium 4.7, Sodium 131, >60 07/06/2019 Creatinine 0.84, BUN 6,   Potassium 5.7, Sodium 134, >60 03/12/2018 Creatinine 0.96, BUN 6,   Potassium 4.7, Sodium 131, GFR >60 A complete set of results can be found in Results Review.   Recommendations:  Unable to reach.     Follow-up plan: ICM clinic phone appointment on 08/13/2020.  91 day device clinic remote transmission 08/09/2020.      EP/Cardiology Office Visits:  Recall for 07/05/2020 with Dr Haroldine Laws.     Copy of ICM check sent to Dr. Rayann Heman.    3 month ICM trend: 07/09/2020.    1 Year ICM trend:       Rosalene Billings, RN 07/09/2020 10:59 AM

## 2020-07-27 ENCOUNTER — Other Ambulatory Visit (HOSPITAL_COMMUNITY): Payer: Self-pay

## 2020-07-27 MED ORDER — ROSUVASTATIN CALCIUM 10 MG PO TABS: 10.0000 mg | ORAL_TABLET | Freq: Every evening | ORAL | 0 refills | Status: DC

## 2020-07-31 ENCOUNTER — Other Ambulatory Visit (HOSPITAL_COMMUNITY): Payer: Self-pay | Admitting: *Deleted

## 2020-07-31 DIAGNOSIS — I2510 Atherosclerotic heart disease of native coronary artery without angina pectoris: Secondary | ICD-10-CM

## 2020-07-31 DIAGNOSIS — I255 Ischemic cardiomyopathy: Secondary | ICD-10-CM

## 2020-07-31 DIAGNOSIS — I5022 Chronic systolic (congestive) heart failure: Secondary | ICD-10-CM

## 2020-07-31 MED ORDER — ROSUVASTATIN CALCIUM 10 MG PO TABS: 10.0000 mg | ORAL_TABLET | Freq: Every evening | ORAL | 3 refills | Status: DC

## 2020-08-09 ENCOUNTER — Ambulatory Visit (INDEPENDENT_AMBULATORY_CARE_PROVIDER_SITE_OTHER): Payer: PPO

## 2020-08-09 DIAGNOSIS — I255 Ischemic cardiomyopathy: Secondary | ICD-10-CM | POA: Diagnosis not present

## 2020-08-10 LAB — CUP PACEART REMOTE DEVICE CHECK
Battery Remaining Longevity: 70 mo
Battery Remaining Percentage: 68 %
Battery Voltage: 2.98 V
Brady Statistic RV Percent Paced: 1 %
Date Time Interrogation Session: 20220728020017
HighPow Impedance: 62 Ohm
HighPow Impedance: 62 Ohm
Implantable Lead Implant Date: 20181017
Implantable Lead Location: 753860
Implantable Pulse Generator Implant Date: 20181017
Lead Channel Impedance Value: 440 Ohm
Lead Channel Pacing Threshold Amplitude: 0.5 V
Lead Channel Pacing Threshold Pulse Width: 0.5 ms
Lead Channel Sensing Intrinsic Amplitude: 12 mV
Lead Channel Setting Pacing Amplitude: 2.5 V
Lead Channel Setting Pacing Pulse Width: 0.5 ms
Lead Channel Setting Sensing Sensitivity: 0.5 mV
Pulse Gen Serial Number: 9781512

## 2020-08-13 ENCOUNTER — Ambulatory Visit (INDEPENDENT_AMBULATORY_CARE_PROVIDER_SITE_OTHER): Payer: PPO

## 2020-08-13 DIAGNOSIS — I5022 Chronic systolic (congestive) heart failure: Secondary | ICD-10-CM | POA: Diagnosis not present

## 2020-08-13 DIAGNOSIS — Z95810 Presence of automatic (implantable) cardiac defibrillator: Secondary | ICD-10-CM | POA: Diagnosis not present

## 2020-08-14 NOTE — Progress Notes (Signed)
EPIC Encounter for ICM Monitoring  Patient Name: [name] [name] is a 70 y.o. male Date: 08/14/2020 Primary Care Physican: [place], [place] At Primary Cardiologist: [place] Electrophysiologist: Allred 08/14/2020 Weight: 130 lbs          Spoke with patient and heart failure questions reviewed.  Pt asymptomatic for fluid accumulation and feeling well.   CorVue Thoracic impedance suggesting normal fluid levels.     Prescribed: Furosemide  40 mg take 1 tablet PRN for swelling or weight gain. Potassium    20 mEq take 1 tablet PRN when taking Furosemide.   Labs: 07/11/2019 Creatinine 0.87, BUN 7,   Potassium 4.7, Sodium 131, >60 07/06/2019 Creatinine 0.84, BUN 6,   Potassium 5.7, Sodium 134, >60 03/12/2018 Creatinine 0.96, BUN 6,   Potassium 4.7, Sodium 131, GFR >60 A complete set of results can be found in Results Review.   Recommendations:  No changes and encouraged to call if experiencing any fluid symptoms.   Follow-up plan: ICM clinic phone appointment on 09/13/2020.  91 day device clinic remote transmission 11/08/2020.      EP/Cardiology Office Visits:  Recall for 07/05/2020 with Dr Haroldine Laws.     Copy of ICM check sent to Dr. Rayann Heman.     3 month ICM trend: 08/13/2020.    1 Year ICM trend:       Rosalene Billings, RN 08/14/2020 4:07 PM

## 2020-09-04 NOTE — Progress Notes (Signed)
Remote ICD transmission.   

## 2020-09-07 ENCOUNTER — Other Ambulatory Visit (HOSPITAL_COMMUNITY): Payer: Self-pay | Admitting: Internal Medicine

## 2020-09-18 ENCOUNTER — Ambulatory Visit (INDEPENDENT_AMBULATORY_CARE_PROVIDER_SITE_OTHER): Payer: PPO

## 2020-09-18 DIAGNOSIS — Z95810 Presence of automatic (implantable) cardiac defibrillator: Secondary | ICD-10-CM

## 2020-09-18 DIAGNOSIS — I5022 Chronic systolic (congestive) heart failure: Secondary | ICD-10-CM

## 2020-09-19 NOTE — Progress Notes (Signed)
EPIC Encounter for ICM Monitoring  Patient Name: [name] [name] is a 70 y.o. male Date: 09/19/2020 Primary Care Physican: [place], [place] At Primary Cardiologist: [place] Electrophysiologist: Allred 09/19/2020 Weight: 130 lbs          Spoke with patient and heart failure questions reviewed.  Pt asymptomatic for fluid accumulation and feeling well.   CorVue Thoracic impedance suggesting normal fluid levels.     Prescribed: Furosemide  40 mg take 1 tablet PRN for swelling or weight gain. Potassium    20 mEq take 1 tablet PRN when taking Furosemide.   Labs: 07/11/2019 Creatinine 0.87, BUN 7,   Potassium 4.7, Sodium 131, >60 07/06/2019 Creatinine 0.84, BUN 6,   Potassium 5.7, Sodium 134, >60 03/12/2018 Creatinine 0.96, BUN 6,   Potassium 4.7, Sodium 131, GFR >60 A complete set of results can be found in Results Review.   Recommendations:  No changes and encouraged to call if experiencing any fluid symptoms.   Follow-up plan: ICM clinic phone appointment on 10/22/2020.  91 day device clinic remote transmission 11/08/2020.      EP/Cardiology Office Visits:  10/26/2020 with Dr Haroldine Laws.  11/02/2020 with Allred   Copy of ICM check sent to Dr. Rayann Heman.      3 month ICM trend: 09/18/2020.    1 Year ICM trend:       Rosalene Billings, RN 09/19/2020 2:09 PM

## 2020-10-22 ENCOUNTER — Ambulatory Visit (INDEPENDENT_AMBULATORY_CARE_PROVIDER_SITE_OTHER): Payer: PPO

## 2020-10-22 DIAGNOSIS — Z95810 Presence of automatic (implantable) cardiac defibrillator: Secondary | ICD-10-CM

## 2020-10-22 DIAGNOSIS — I5022 Chronic systolic (congestive) heart failure: Secondary | ICD-10-CM | POA: Diagnosis not present

## 2020-10-24 NOTE — Progress Notes (Signed)
EPIC Encounter for ICM Monitoring  Patient Name: [name] [name] is a 70 y.o. male Date: 10/24/2020 Primary Care Physican: [place], [place] At Primary Cardiologist: [place] Electrophysiologist: Allred 10/24/2020 Weight: 130 lbs          Spoke with patient and heart failure questions reviewed.  Pt asymptomatic for fluid accumulation and feeling well.   CorVue Thoracic impedance suggesting normal fluid levels but was suggesting possible fluid accumulation from 9/29-10/6.   Prescribed: Furosemide  40 mg take 1 tablet PRN for swelling or weight gain. Potassium    20 mEq take 1 tablet PRN when taking Furosemide.   Labs: 07/11/2019 Creatinine 0.87, BUN 7,   Potassium 4.7, Sodium 131, >60 07/06/2019 Creatinine 0.84, BUN 6,   Potassium 5.7, Sodium 134, >60 03/12/2018 Creatinine 0.96, BUN 6,   Potassium 4.7, Sodium 131, GFR >60 A complete set of results can be found in Results Review.   Recommendations:  No changes and encouraged to call if experiencing any fluid symptoms.   Follow-up plan: ICM clinic phone appointment on 11/26/2020.  91 day device clinic remote transmission 11/08/2020.      EP/Cardiology Office Visits:  10/26/2020 with Dr Haroldine Laws.  11/02/2020 with Allred   Copy of ICM check sent to Dr. Rayann Heman.   3 month ICM trend: 10/22/2020.    1 Year ICM trend:       Rosalene Billings, RN 10/24/2020 2:34 PM

## 2020-10-26 ENCOUNTER — Other Ambulatory Visit: Payer: Self-pay

## 2020-10-26 ENCOUNTER — Encounter (HOSPITAL_COMMUNITY): Payer: Self-pay | Admitting: Internal Medicine

## 2020-10-26 ENCOUNTER — Ambulatory Visit (HOSPITAL_COMMUNITY)
Admission: RE | Admit: 2020-10-26 | Discharge: 2020-10-26 | Disposition: A | Discharge status: Home / Self Care | Payer: PPO | Source: Ambulatory Visit | Attending: Internal Medicine | Admitting: Internal Medicine

## 2020-10-26 VITALS — BP 132/80 | HR 77 | Wt 125.6 lb

## 2020-10-26 DIAGNOSIS — Z79899 Other long term (current) drug therapy: Secondary | ICD-10-CM | POA: Diagnosis not present

## 2020-10-26 DIAGNOSIS — Z7901 Long term (current) use of anticoagulants: Secondary | ICD-10-CM | POA: Diagnosis not present

## 2020-10-26 DIAGNOSIS — I493 Ventricular premature depolarization: Secondary | ICD-10-CM | POA: Diagnosis not present

## 2020-10-26 DIAGNOSIS — I2510 Atherosclerotic heart disease of native coronary artery without angina pectoris: Secondary | ICD-10-CM | POA: Diagnosis not present

## 2020-10-26 DIAGNOSIS — I110 Hypertensive heart disease with heart failure: Secondary | ICD-10-CM | POA: Insufficient documentation

## 2020-10-26 DIAGNOSIS — I513 Intracardiac thrombosis, not elsewhere classified: Secondary | ICD-10-CM | POA: Insufficient documentation

## 2020-10-26 DIAGNOSIS — N400 Enlarged prostate without lower urinary tract symptoms: Secondary | ICD-10-CM | POA: Insufficient documentation

## 2020-10-26 DIAGNOSIS — F17210 Nicotine dependence, cigarettes, uncomplicated: Secondary | ICD-10-CM | POA: Diagnosis not present

## 2020-10-26 DIAGNOSIS — Z8674 Personal history of sudden cardiac arrest: Secondary | ICD-10-CM | POA: Insufficient documentation

## 2020-10-26 DIAGNOSIS — I480 Paroxysmal atrial fibrillation: Secondary | ICD-10-CM | POA: Insufficient documentation

## 2020-10-26 DIAGNOSIS — Z4502 Encounter for adjustment and management of automatic implantable cardiac defibrillator: Secondary | ICD-10-CM | POA: Insufficient documentation

## 2020-10-26 DIAGNOSIS — E785 Hyperlipidemia, unspecified: Secondary | ICD-10-CM | POA: Insufficient documentation

## 2020-10-26 DIAGNOSIS — I5022 Chronic systolic (congestive) heart failure: Secondary | ICD-10-CM | POA: Diagnosis not present

## 2020-10-26 LAB — CBC
HCT: 37.5 % — ABNORMAL LOW (ref 39.0–52.0)
Hemoglobin: 13 g/dL (ref 13.0–17.0)
MCH: 38.2 pg — ABNORMAL HIGH (ref 26.0–34.0)
MCHC: 34.7 g/dL (ref 30.0–36.0)
MCV: 110.3 fL — ABNORMAL HIGH (ref 80.0–100.0)
Platelets: 159 10*3/uL (ref 150–400)
RBC: 3.4 MIL/uL — ABNORMAL LOW (ref 4.22–5.81)
RDW: 12.9 % (ref 11.5–15.5)
WBC: 6 10*3/uL (ref 4.0–10.5)
nRBC: 0 % (ref 0.0–0.2)

## 2020-10-26 LAB — LIPID PANEL
Cholesterol: 110 mg/dL (ref 0–200)
HDL: 49 mg/dL (ref >40)
LDL Cholesterol: 51 mg/dL (ref 0–99)
Total CHOL/HDL Ratio: 2.2 RATIO
Triglycerides: 52 mg/dL (ref <150)
VLDL: 10 mg/dL (ref 0–40)

## 2020-10-26 LAB — COMPREHENSIVE METABOLIC PANEL
ALT: 19 U/L (ref 0–44)
AST: 30 U/L (ref 15–41)
Albumin: 3.8 g/dL (ref 3.5–5.0)
Alkaline Phosphatase: 29 U/L — ABNORMAL LOW (ref 38–126)
Anion gap: 7 (ref 5–15)
BUN: 12 mg/dL (ref 8–23)
CO2: 27 mmol/L (ref 22–32)
Calcium: 9.3 mg/dL (ref 8.9–10.3)
Chloride: 98 mmol/L (ref 98–111)
Creatinine, Ser: 1.1 mg/dL (ref 0.61–1.24)
GFR, Estimated: >60 mL/min (ref >60)
Glucose, Bld: 134 mg/dL — ABNORMAL HIGH (ref 70–99)
Potassium: 4.8 mmol/L (ref 3.5–5.1)
Sodium: 132 mmol/L — ABNORMAL LOW (ref 135–145)
Total Bilirubin: 1 mg/dL (ref 0.3–1.2)
Total Protein: 6.2 g/dL — ABNORMAL LOW (ref 6.5–8.1)

## 2020-10-26 LAB — BRAIN NATRIURETIC PEPTIDE: B Natriuretic Peptide: 398 pg/mL — ABNORMAL HIGH (ref 0.0–100.0)

## 2020-10-26 MED ORDER — EMPAGLIFLOZIN 10 MG PO TABS: 10.0000 mg | ORAL_TABLET | Freq: Every day | ORAL | 3 refills | Status: DC

## 2020-10-26 NOTE — Addendum Note (Signed)
Encounter addended by: Shonna Chock, CMA on: 04/03/2246 2:43 PM  Actions taken: Order list changed, Pharmacy for encounter modified

## 2020-10-26 NOTE — Patient Instructions (Signed)
EKG done today.  Labs done today. We will contact you only if your labs are abnormal.  START Jardiance 10mg  (1 tablet) by mouth daily.   No other medication changes were made. Please continue all current medications as prescribed.  Your physician recommends that you schedule a follow-up appointment in: 6 months with an echo prior to your exam. Please contact our office in March 2023 to schedule a April 2023 appointment.   If you have any questions or concerns before your next appointment please send [place] a message through [place] or call our office at [phone].    TO LEAVE A MESSAGE FOR THE NURSE SELECT OPTION 2, PLEASE LEAVE A MESSAGE INCLUDING: YOUR NAME DATE OF BIRTH CALL BACK NUMBER REASON FOR CALL**this is important as we prioritize the call backs  YOU WILL RECEIVE A CALL BACK THE SAME DAY AS LONG AS YOU CALL BEFORE 4:00 PM   Do the following things EVERYDAY: Weigh yourself in the morning before breakfast. Write it down and keep it in a log. Take your medicines as prescribed Eat low salt foods--Limit salt (sodium) to 2000 mg per day.  Stay as active as you can everyday Limit all fluids for the day to less than 2 liters   At the [facility], you and your health needs are our priority. As part of our continuing mission to provide you with exceptional heart care, we have created designated Provider Care Teams. These Care Teams include your primary Cardiologist (physician) and Advanced Practice Providers (APPs- Physician Assistants and Nurse Practitioners) who all work together to provide you with the care you need, when you need it.   You may see any of the following providers on your designated Care Team at your next follow up: Dr Glori Bickers Dr Haynes Kerns, NP Lyda Jester, [place] Audry Riles, PharmD   Please be sure to bring in all your medications bottles to every appointment.

## 2020-10-26 NOTE — Addendum Note (Signed)
Encounter addended by: Shonna Chock, CMA on: 65/78/4696 2:36 PM  Actions taken: Order list changed, Diagnosis association updated

## 2020-10-26 NOTE — Progress Notes (Signed)
Advanced Heart Failure Clinic Note   Primary Cardiologist: Dr. Haroldine [name]  EP: Dr. Rayann [name]  HPI  [name] [name] is a 70 y.o. male with history of Systolic CHF, CAD, HTN, HLD, tobacco abuse, and BPH.   Had complicated admission 2/35 due to out of hospital cardiac arrest c/b cpr/defib and gi perforation thought 2/2 to rib fracture as well as large chest wall hematoma and t8 vertebral fracture thought all from CPR. Noted to have LV thrombus as well. Pt underwent multiple procedures including exploratory laparotomy and repair of perforated stomach with an omental patch by General Surgery and evacuation of chest hematoma by TCTS. Cath as below with 3v disease though mostly non-obstructive, medical management pursued.  Lifevest placed prior to discharge.  S/p SJ ICD   He presents today for follow up. Feels good. Denies CP, SOB, orthopnea or PND. No edema. Follows with [name] [name] who adjust diuretics.    ICD interrogation: No VT/AF Impedance looks good. Personally reviewe   Echo 02/26/18  35-40% with antero-septal AK   Echo 12/2016 40-45% with antero-septal AK   Echo 08/06/16 - LVEF 35-40%, grade 1 DD. Apparent medium-sized apical thrombus.   Left/Right heart cath 08/13/16 Ost RCA lesion, 80 %stenosed. Ost LM lesion, 40 %stenosed. Ost Ramus to Ramus lesion, 80 %stenosed. Ost LAD to Prox LAD lesion, 70 %stenosed. Ost 1st Diag to 1st Diag lesion, 75 %stenosed. Mid LAD to Dist LAD lesion, 40 %stenosed.   Findings:   Ao = 141/70 (102) LV =  126/13 RA =  5 RV = 29/8 PA = 33/9 (22) PCW = 12 Fick cardiac output/index = 5.3/3.0 PVR = 1.9 WU Ao sat = 92%  PA sat = 55%,54%  Review of systems complete and found to be negative unless listed in HPI.    Past Medical History:  Diagnosis Date   AICD (automatic cardioverter/defibrillator) present    CAD (coronary artery disease)    Coronary artery disease    1980's-90's MI with perhaps angioplasty   Hypertension    Ischemic  cardiomyopathy    LV (left ventricular) mural thrombus following MI [facility])    Myocardial infarction [facility]) 1999   Ventricular fibrillation ([place]) 07/2016       Current Outpatient Medications  Medication Sig Dispense Refill   acetaminophen (TYLENOL) 325 MG tablet Take 2 tablets (650 mg total) by mouth every 4 (four) hours as needed for mild pain or headache.     apixaban (ELIQUIS) 5 MG TABS tablet Take 1 tablet (5 mg total) by mouth 2 (two) times daily. 180 tablet 3   carvedilol (COREG) 12.5 MG tablet TAKE 1 TABLET(12.5 MG) BY MOUTH TWICE DAILY WITH A MEAL 180 tablet 0   furosemide (LASIX) 40 MG tablet Take 1 tablet (40 mg total) daily as needed for swelling and/or weight gain. 30 tablet 3   potassium chloride SA (KLOR-CON M20) 20 MEQ tablet Take 1 tablet (20 mEq total) daily when taking Furosemide as needed. 30 tablet 3   rosuvastatin (CRESTOR) 10 MG tablet Take 1 tablet (10 mg total) by mouth every evening. 90 tablet 3   sacubitril-valsartan (ENTRESTO) 97-103 MG Take 1 tablet by mouth 2 (two) times daily. 180 tablet 3   No current facility-administered medications for this encounter.   Allergies  Allergen Reactions   Spironolactone Other (See Comments)    Breast pain--swelling/inflammation    Social History   Socioeconomic History   Marital status: Married    Spouse name: Not on file   Number of  children: Not on file   Years of education: Not on file   Highest education level: Not on file  Occupational History   Not on file  Tobacco Use   Smoking status: Every Day    Packs/day: 1.00    Years: 25.00    Pack years: 25.00    Types: Cigarettes   Smokeless tobacco: Former    Types: Nurse, children's Use: Never used  Substance and Sexual Activity   Alcohol use: Yes    Alcohol/week: 14.0 standard drinks    Types: 14 Glasses of wine per week    Comment: 10/29/2016 "couple glasses of red wine/day"   Drug use: No   Sexual activity: Not Currently  Other Topics Concern    Not on file  Social History Narrative   Lives in [place] with spouse   Social Determinants of Health   Financial Resource Strain: Not on file  Food Insecurity: Not on file  Transportation Needs: Not on file  Physical Activity: Not on file  Stress: Not on file  Social Connections: Not on file  Intimate Partner Violence: Not on file    Family History  Problem Relation Age of Onset   Osteoporosis Mother    Lung cancer Father    Other Brother        died in [place]   Vitals:   Nov 10, 2020 1350  BP: 132/80  Pulse: 77  SpO2: 99%  Weight: 57 kg (125 lb 9.6 oz)    Wt Readings from Last 3 Encounters:  November 10, 2020 57 kg (125 lb 9.6 oz)  11/02/19 58.1 kg (128 lb)  07/06/19 59 kg (130 lb)    PHYSICAL EXAM: General:  Elderly Thin. No resp difficulty HEENT: normal Neck: supple. no JVD. Carotids 2+ bilat; no bruits. No lymphadenopathy or thryomegaly appreciated. Cor: PMI nondisplaced. Regular rate & rhythm. No rubs, gallops or murmurs. Lungs: clear Abdomen: soft, nontender, nondistended. No hepatosplenomegaly. No bruits or masses. Good bowel sounds. Extremities: no cyanosis, clubbing, rash, edema Neuro: alert & orientedx3, cranial nerves grossly intact. moves all 4 extremities w/o difficulty. Affect pleasant   ECG: NSR 72 anterior and inferior Qs (no change) Personally reviewed   ASSESSMENT & PLAN:  1. CAD with ICM s/p Cardiac Arrest VT/VF:  - Cath 08/13/16 with 3V disease (mostly non-obstructive despite ECG findings)  medical therapy. If develops angina can consider PCI to Ramus.  - s/p SJ ICD - Doing well. No s/s angina - Start Jardiance - Continue statin - Off ASA with Eliquis 2. Chronic systolic HF.  - Echo 8/84/16 - LVEF 35-40%, grade 1 DD. + apical thrombus.  - Echo 12/2016 EF 40-45% No LV thrombus - Echo 2/21 EF 35-40% - s/p SJ ICD - Stable NYHA I-II  - Volume status stable on exam and ICD interrogation (done personally) - BP elevated in setting of not taking meds  this am  - Continue entresto 97/103 mg BID.  - Continue carvedilol 12.5 mg BID.  - Off eplerenone due to hyperkalemia.  (did not tolerate spiro due to painful gynecomastia - Start Jardiance 10 - Labs today  - RTC in 6 months with echo  3. Perforated stomach s/p repair. - happened in setting of CPR s/p repair.  - Doing well.  4. 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  - Continue carvedilol to 9.375 mg BID.  5. LV Thrombus:  - Resolved on echo 6. ETOH and tobacco abuse.  - No longer drinking beer. Drinks a few glasses  of wine nightly. Suggested he cut back to 2 or less 7. PAF - In NSR today  - Continue Eliquis 5 mg BID.  8. HTN - Blood pressure well controlled. Continue current regimen.  Glori Bickers, MD 10/26/20

## 2020-10-26 NOTE — Addendum Note (Signed)
Encounter addended by: Shonna Chock, CMA on: 06/89/3406 2:40 PM  Actions taken: Clinical Note Signed

## 2020-10-29 DIAGNOSIS — D23122 Other benign neoplasm of skin of left lower eyelid, including canthus: Secondary | ICD-10-CM | POA: Diagnosis not present

## 2020-10-29 DIAGNOSIS — Z961 Presence of intraocular lens: Secondary | ICD-10-CM | POA: Diagnosis not present

## 2020-10-29 DIAGNOSIS — H26491 Other secondary cataract, right eye: Secondary | ICD-10-CM | POA: Diagnosis not present

## 2020-10-29 DIAGNOSIS — H02831 Dermatochalasis of right upper eyelid: Secondary | ICD-10-CM | POA: Diagnosis not present

## 2020-11-02 ENCOUNTER — Ambulatory Visit: Payer: PPO | Admitting: Internal Medicine

## 2020-11-02 ENCOUNTER — Encounter: Payer: Self-pay | Admitting: Internal Medicine

## 2020-11-02 ENCOUNTER — Other Ambulatory Visit: Payer: Self-pay

## 2020-11-02 VITALS — BP 100/72 | HR 69 | Ht 68.0 in | Wt 123.4 lb

## 2020-11-02 DIAGNOSIS — Z720 Tobacco use: Secondary | ICD-10-CM | POA: Diagnosis not present

## 2020-11-02 DIAGNOSIS — I480 Paroxysmal atrial fibrillation: Secondary | ICD-10-CM | POA: Diagnosis not present

## 2020-11-02 DIAGNOSIS — I2510 Atherosclerotic heart disease of native coronary artery without angina pectoris: Secondary | ICD-10-CM

## 2020-11-02 DIAGNOSIS — I255 Ischemic cardiomyopathy: Secondary | ICD-10-CM

## 2020-11-02 DIAGNOSIS — I4901 Ventricular fibrillation: Secondary | ICD-10-CM | POA: Diagnosis not present

## 2020-11-02 DIAGNOSIS — I5022 Chronic systolic (congestive) heart failure: Secondary | ICD-10-CM

## 2020-11-02 NOTE — Progress Notes (Signed)
PCP: [facility] Primary Cardiologist: Dr Haroldine Laws Primary EP: Dr Darryll Capers is a 70 y.o. male who presents today for routine electrophysiology followup.  Since last being seen in our clinic, the patient reports doing reasonably well.  Today, he denies symptoms of palpitations, chest pain, shortness of breath,  lower extremity edema, dizziness, presyncope, syncope, or ICD shocks.  The patient is otherwise without complaint today.   Past Medical History:  Diagnosis Date   AICD (automatic cardioverter/defibrillator) present    CAD (coronary artery disease)    Coronary artery disease    1980's-90's MI with perhaps angioplasty   Hypertension    Ischemic cardiomyopathy    LV (left ventricular) mural thrombus following MI ([place])    Myocardial infarction [facility]) 1999   Ventricular fibrillation ([place]) 07/2016       Past Surgical History:  Procedure Laterality Date   CORONARY ANGIOPLASTY  1999   "Dr. Vidal Schwalbe"   HEMATOMA EVACUATION Left 08/15/2016   Procedure: INSERTION OF DRAIN ANTERIOR LEFT CHEST WALL HEMATOMA;  Surgeon: Ivin Poot, MD;  Location: [place];  Service: Thoracic;  Laterality: Left;   ICD IMPLANT N/A 10/29/2016   [place] [phone] (serial  Number [phone]) ICD for secondary prevention of sudden death after prior VF arrest by Dr Rayann Heman   LAPAROTOMY N/A 08/04/2016   Procedure: EXPLORATORY LAPAROTOMY FREE AIR BIOPSY OF PERFORATED STOMACH ULCER REPAIR OF PERFORATED STOMACH ULCER WITH A OMENTAL PATCH;  Surgeon: Georganna Skeans, MD;  Location: [place];  Service: General;  Laterality: N/A;   RIGHT/LEFT HEART CATH AND CORONARY ANGIOGRAPHY N/A 08/13/2016   Procedure: Right/Left Heart Cath and Coronary Angiography;  Surgeon: Jolaine Artist, MD;  Location: [place] CV LAB;  Service: Cardiovascular;  Laterality: N/A;    ROS- all systems are reviewed and negative except as per HPI above  Current Outpatient  Medications  Medication Sig Dispense Refill   acetaminophen (TYLENOL) 325 MG tablet Take 2 tablets (650 mg total) by mouth every 4 (four) hours as needed for mild pain or headache.     apixaban (ELIQUIS) 5 MG TABS tablet Take 1 tablet (5 mg total) by mouth 2 (two) times daily. 180 tablet 3   carvedilol (COREG) 12.5 MG tablet TAKE 1 TABLET(12.5 MG) BY MOUTH TWICE DAILY WITH A MEAL 180 tablet 0   empagliflozin (JARDIANCE) 10 MG TABS tablet Take 1 tablet (10 mg total) by mouth daily before breakfast. 90 tablet 3   furosemide (LASIX) 40 MG tablet Take 1 tablet (40 mg total) daily as needed for swelling and/or weight gain. 30 tablet 3   potassium chloride SA (KLOR-CON M20) 20 MEQ tablet Take 1 tablet (20 mEq total) daily when taking Furosemide as needed. 30 tablet 3   rosuvastatin (CRESTOR) 10 MG tablet Take 1 tablet (10 mg total) by mouth every evening. 90 tablet 3   sacubitril-valsartan (ENTRESTO) 97-103 MG Take 1 tablet by mouth 2 (two) times daily. 180 tablet 3   No current facility-administered medications for this visit.    Physical Exam: Vitals:   11/02/20 1507  BP: 100/72  Pulse: 69  SpO2: 99%  Weight: 123 lb 6.4 oz (56 kg)  Height: 5\' 8"  (1.727 m)    GEN- The patient is well appearing, alert and oriented x 3 today.   Head- normocephalic, atraumatic Eyes-  Sclera clear, conjunctiva pink Ears- hearing intact Oropharynx- clear Lungs- Clear to ausculation bilaterally, normal work of breathing Chest- ICD pocket  is well healed Heart- Regular rate and rhythm, no murmurs, rubs or gallops, PMI not laterally displaced GI- soft, NT, ND, + BS Extremities- no clubbing, cyanosis, or edema  ICD interrogation- reviewed in detail today,  See PACEART report  ekg tracing ordered today is personally reviewed and shows sinus with QRS 78 msec, anterior and inferior infarct patterns  Wt Readings from Last 3 Encounters:  11/02/20 123 lb 6.4 oz (56 kg)  10/26/20 125 lb 9.6 oz (57 kg)  11/02/19  128 lb (58.1 kg)    Assessment and Plan:  1.  Chronic systolic dysfunction/ ischemic CM/ CAD euvolemic today No ischemic symptoms Stable on an appropriate medical regimen Normal ICD function See [place] report No changes today he is not device dependant today followed in ICM device clinic Labs 10/26/20 reviewed Dr Gillermina Hu notes reviewed  2. Prior VF cardiac arrest Clinically doing well Normal ICD function  3. Paroxysmal atrial fibrillation Well controlled On eliquis for chads2vasc score of 4  4. Tobacco Cessation advised He is trying to cut back  Risks, benefits and potential toxicities for medications prescribed and/or refilled reviewed with patient today.   Return to see EP APP in a year  Thompson Grayer MD, [facility] 11/02/2020 3:22 PM'

## 2020-11-02 NOTE — Patient Instructions (Addendum)
Medication Instructions:  Your physician recommends that you continue on your current medications as directed. Please refer to the Current Medication list given to you today. *If you need a refill on your cardiac medications before your next appointment, please call your pharmacy*  Lab Work: None. If you have labs (blood work) drawn today and your tests are completely normal, you will receive your results only by: [place] (if you have MyChart) OR A paper copy in the mail If you have any lab test that is abnormal or we need to change your treatment, we will call you to review the results.  Testing/Procedures: None.  Follow-Up: At [facility], you and your health needs are our priority.  As part of our continuing mission to provide you with exceptional heart care, we have created designated Provider Care Teams.  These Care Teams include your primary Cardiologist (physician) and Advanced Practice Providers (APPs -  Physician Assistants and Nurse Practitioners) who all work together to provide you with the care you need, when you need it.  Your physician wants you to follow-up in: 12 months with   one of the following Advanced Practice Providers on your designated Care Team:     [name] [name], [name] [name]   You will receive a reminder letter in the mail two months in advance. If you don't receive a letter, please call our office to schedule the follow-up appointment.  Remote monitoring is used to monitor your ICD from home. This monitoring reduces the number of office visits required to check your device to one time per year. It allows [place] to keep an eye on the functioning of your device to ensure it is working properly. You are scheduled for a device check from home on 11/08/20. You may send your transmission at any time that day. If you have a wireless device, the transmission will be sent automatically. After your physician reviews your transmission, you will receive a postcard  with your next transmission date.  We recommend signing up for the patient portal called "MyChart".  Sign up information is provided on this After Visit Summary.  MyChart is used to connect with patients for Virtual Visits (Telemedicine).  Patients are able to view lab/test results, encounter notes, upcoming appointments, etc.  Non-urgent messages can be sent to your provider as well.   To learn more about what you can do with MyChart, go to [email or web address].    Any Other Special Instructions Will Be Listed Below (If Applicable).

## 2020-11-05 ENCOUNTER — Other Ambulatory Visit (HOSPITAL_COMMUNITY): Payer: Self-pay | Admitting: Internal Medicine

## 2020-11-08 ENCOUNTER — Ambulatory Visit (INDEPENDENT_AMBULATORY_CARE_PROVIDER_SITE_OTHER): Payer: PPO

## 2020-11-08 DIAGNOSIS — I5022 Chronic systolic (congestive) heart failure: Secondary | ICD-10-CM | POA: Diagnosis not present

## 2020-11-08 LAB — CUP PACEART REMOTE DEVICE CHECK
Battery Remaining Longevity: 67 mo
Battery Remaining Percentage: 66 %
Battery Voltage: 2.98 V
Brady Statistic RV Percent Paced: 1 %
Date Time Interrogation Session: 20221027020016
HighPow Impedance: 71 Ohm
HighPow Impedance: 71 Ohm
Implantable Lead Implant Date: 20181017
Implantable Lead Location: 753860
Implantable Pulse Generator Implant Date: 20181017
Lead Channel Impedance Value: 450 Ohm
Lead Channel Pacing Threshold Amplitude: 0.5 V
Lead Channel Pacing Threshold Pulse Width: 0.5 ms
Lead Channel Sensing Intrinsic Amplitude: 12 mV
Lead Channel Setting Pacing Amplitude: 2.5 V
Lead Channel Setting Pacing Pulse Width: 0.5 ms
Lead Channel Setting Sensing Sensitivity: 0.5 mV
Pulse Gen Serial Number: 9781512

## 2020-11-16 NOTE — Progress Notes (Signed)
Remote ICD transmission.   

## 2020-11-26 ENCOUNTER — Ambulatory Visit (INDEPENDENT_AMBULATORY_CARE_PROVIDER_SITE_OTHER): Payer: PPO

## 2020-11-26 DIAGNOSIS — I5022 Chronic systolic (congestive) heart failure: Secondary | ICD-10-CM

## 2020-11-26 DIAGNOSIS — Z95810 Presence of automatic (implantable) cardiac defibrillator: Secondary | ICD-10-CM | POA: Diagnosis not present

## 2020-11-27 NOTE — Progress Notes (Signed)
EPIC Encounter for ICM Monitoring  Patient Name: [name] [name] is a 70 y.o. male Date: 11/27/2020 Primary Care Physican: [place], [place] At Primary Cardiologist: [place] Electrophysiologist: [place] 10/24/2020 Weight: 130 lbs          Spoke with patient and heart failure questions reviewed.  Pt asymptomatic for fluid accumulation and feeling well.   CorVue Thoracic impedance suggesting normal fluid levels.   Prescribed: Furosemide 40 mg take 1 tablet PRN for swelling or weight gain. Potassium   20 mEq take 1 tablet PRN when taking Furosemide.   Labs: 10/26/2020 Creatinine 1.10, BUN 12, Potassium 4.8, Sodium 132, GFR >60 A complete set of results can be found in Results Review.   Recommendations:  No changes and encouraged to call if experiencing any fluid symptoms.   Follow-up plan: ICM clinic phone appointment on 12/31/2020.  91 day device clinic remote transmission 02/07/2021.      EP/Cardiology Office Visits:  Recall 07/05/2021 with Dr Haroldine Laws.  Recall 11/22/2021 with Oda Kilts, PA.   Copy of ICM check sent to Dr. Rayann Heman.    3 month ICM trend: 11/26/2020.    12-14 Month ICM trend:       Rosalene Billings, RN 11/27/2020 4:26 PM

## 2020-11-30 ENCOUNTER — Other Ambulatory Visit (HOSPITAL_COMMUNITY): Payer: Self-pay | Admitting: Cardiology

## 2020-11-30 MED ORDER — ENTRESTO 97-103 MG PO TABS: 1.0000 | ORAL_TABLET | Freq: Two times a day (BID) | ORAL | 3 refills | Status: DC

## 2020-12-24 ENCOUNTER — Other Ambulatory Visit (HOSPITAL_COMMUNITY): Payer: Self-pay | Admitting: Internal Medicine

## 2020-12-31 ENCOUNTER — Ambulatory Visit (INDEPENDENT_AMBULATORY_CARE_PROVIDER_SITE_OTHER): Payer: PPO

## 2020-12-31 DIAGNOSIS — I5022 Chronic systolic (congestive) heart failure: Secondary | ICD-10-CM | POA: Diagnosis not present

## 2020-12-31 DIAGNOSIS — Z95810 Presence of automatic (implantable) cardiac defibrillator: Secondary | ICD-10-CM

## 2021-01-01 NOTE — Progress Notes (Signed)
EPIC Encounter for ICM Monitoring  Patient Name: [name] [name] is a 70 y.o. male Date: 01/01/2021 Primary Care Physican: [place], [place] At Primary Cardiologist: [place] Electrophysiologist: [place] 10/24/2020 Weight: 130 lbs          Spoke with patient and heart failure questions reviewed.  Pt asymptomatic for fluid accumulation and feeling well.   CorVue Thoracic impedance suggesting normal fluid levels.   Prescribed: Furosemide 40 mg take 1 tablet PRN for swelling or weight gain. Potassium   20 mEq take 1 tablet PRN when taking Furosemide.   Labs: 10/26/2020 Creatinine 1.10, BUN 12, Potassium 4.8, Sodium 132, GFR >60 A complete set of results can be found in Results Review.   Recommendations:  No changes and encouraged to call if experiencing any fluid symptoms.   Follow-up plan: ICM clinic phone appointment on 02/04/2021.  91 day device clinic remote transmission 02/07/2021.      EP/Cardiology Office Visits:  Recall 07/05/2021 with Dr Haroldine Laws.  Recall 11/22/2021 with Oda Kilts, PA.   Copy of ICM check sent to Dr. Rayann Heman.    3 month ICM trend: 12/31/2020.    12-14 Month ICM trend:       Rosalene Billings, RN 01/01/2021 2:36 PM

## 2021-02-04 ENCOUNTER — Ambulatory Visit (INDEPENDENT_AMBULATORY_CARE_PROVIDER_SITE_OTHER): Payer: PPO

## 2021-02-04 DIAGNOSIS — I5022 Chronic systolic (congestive) heart failure: Secondary | ICD-10-CM

## 2021-02-04 DIAGNOSIS — Z95810 Presence of automatic (implantable) cardiac defibrillator: Secondary | ICD-10-CM

## 2021-02-04 NOTE — Progress Notes (Signed)
EPIC Encounter for ICM Monitoring  Patient Name: [name] [name] is a 71 y.o. male Date: 02/04/2021 Primary Care Physican: [place], [place] At Primary Cardiologist: [place] Electrophysiologist: [place] 02/04/2021 Weight: 130 lbs          Spoke with patient and heart failure questions reviewed.  Pt reports he has a slight cold.   CorVue Thoracic impedance suggesting possible fluid accumulation since 1/21.   Prescribed: Furosemide 40 mg take 1 tablet PRN for swelling or weight gain. Potassium   20 mEq take 1 tablet PRN when taking Furosemide.   Labs: 10/26/2020 Creatinine 1.10, BUN 12, Potassium 4.8, Sodium 132, GFR >60 A complete set of results can be found in Results Review.   Recommendations:   Advised to take 20 mg Lasix if needed for fluid.  Encouraged to call if experiencing any fluid symptoms.   Follow-up plan: ICM clinic phone appointment on 03/11/2021.  91 day device clinic remote transmission 02/07/2021.      EP/Cardiology Office Visits:  Recall 07/05/2021 with Dr Haroldine Laws.  Recall 11/22/2021 with Oda Kilts, PA.   Copy of ICM check sent to Dr. Rayann Heman.    3 month ICM trend: 02/04/2021.    12-14 Month ICM trend:     Rosalene Billings, RN 02/04/2021 3:07 PM

## 2021-02-07 ENCOUNTER — Ambulatory Visit (INDEPENDENT_AMBULATORY_CARE_PROVIDER_SITE_OTHER): Payer: PPO

## 2021-02-07 DIAGNOSIS — I255 Ischemic cardiomyopathy: Secondary | ICD-10-CM

## 2021-02-08 LAB — CUP PACEART REMOTE DEVICE CHECK
Battery Remaining Longevity: 66 mo
Battery Remaining Percentage: 64 %
Battery Voltage: 2.98 V
Brady Statistic RV Percent Paced: 1 %
Date Time Interrogation Session: 20230127100014
HighPow Impedance: 64 Ohm
HighPow Impedance: 64 Ohm
Implantable Lead Implant Date: 20181017
Implantable Lead Location: 753860
Implantable Pulse Generator Implant Date: 20181017
Lead Channel Impedance Value: 460 Ohm
Lead Channel Pacing Threshold Amplitude: 0.5 V
Lead Channel Pacing Threshold Pulse Width: 0.5 ms
Lead Channel Sensing Intrinsic Amplitude: 12 mV
Lead Channel Setting Pacing Amplitude: 2.5 V
Lead Channel Setting Pacing Pulse Width: 0.5 ms
Lead Channel Setting Sensing Sensitivity: 0.5 mV
Pulse Gen Serial Number: 9781512

## 2021-02-18 NOTE — Progress Notes (Signed)
Remote ICD transmission.   

## 2021-03-11 ENCOUNTER — Ambulatory Visit (INDEPENDENT_AMBULATORY_CARE_PROVIDER_SITE_OTHER): Payer: PPO

## 2021-03-11 DIAGNOSIS — I5022 Chronic systolic (congestive) heart failure: Secondary | ICD-10-CM | POA: Diagnosis not present

## 2021-03-11 DIAGNOSIS — Z95810 Presence of automatic (implantable) cardiac defibrillator: Secondary | ICD-10-CM

## 2021-03-13 NOTE — Progress Notes (Signed)
EPIC Encounter for ICM Monitoring  Patient Name: [name] [name] is a 71 y.o. male Date: 03/13/2021 Primary Care Physican: [place], [place] At Primary Cardiologist: [place] Electrophysiologist: [place] 03/13/2021 Weight: 130 lbs          Spoke with patient and heart failure questions reviewed.  Pt asymptomatic for fluid accumulation.  Reports feeling well at this time and voices no complaints.    CorVue Thoracic impedance suggesting normal fluid levels.   Prescribed: Furosemide 40 mg take 1 tablet PRN for swelling or weight gain. Potassium   20 mEq take 1 tablet PRN when taking Furosemide.   Labs: 10/26/2020 Creatinine 1.10, BUN 12, Potassium 4.8, Sodium 132, GFR >60 A complete set of results can be found in Results Review.   Recommendations:   Encouraged to call if experiencing any fluid symptoms.   Follow-up plan: ICM clinic phone appointment on 04/15/2021.  91 day device clinic remote transmission 05/09/2021.      EP/Cardiology Office Visits:  Recall 07/05/2021 with Dr Haroldine Laws.  Recall 11/22/2021 with Oda Kilts, PA.   Copy of ICM check sent to Dr. Rayann Heman.     3 month ICM trend: 03/11/2021.    12-14 Month ICM trend:     Rosalene Billings, RN 03/13/2021 4:57 PM

## 2021-03-15 ENCOUNTER — Other Ambulatory Visit (HOSPITAL_COMMUNITY): Payer: Self-pay | Admitting: *Deleted

## 2021-03-15 DIAGNOSIS — I5022 Chronic systolic (congestive) heart failure: Secondary | ICD-10-CM

## 2021-04-15 ENCOUNTER — Ambulatory Visit (INDEPENDENT_AMBULATORY_CARE_PROVIDER_SITE_OTHER): Payer: PPO

## 2021-04-15 DIAGNOSIS — Z95810 Presence of automatic (implantable) cardiac defibrillator: Secondary | ICD-10-CM | POA: Diagnosis not present

## 2021-04-15 DIAGNOSIS — I5022 Chronic systolic (congestive) heart failure: Secondary | ICD-10-CM | POA: Diagnosis not present

## 2021-04-17 NOTE — Progress Notes (Signed)
EPIC Encounter for ICM Monitoring ? ?Patient Name: [name] [name] is a 71 y.o. male ?Date: 04/17/2021 ?[facility]ican: [facility] ?Primary Cardiologist: [place] ?Electrophysiologist: [place] ?03/13/2021 Weight: 130 lbs ?  ?       Spoke with patient and heart failure questions reviewed.  Pt asymptomatic for fluid accumulation.  Reports feeling well at this time and voices no complaints.  Unsure what caused decreased impedance.   ?  ?CorVue Thoracic impedance normal but was suggesting possible fluid accumulation from 3/1-3/9 and 3/11-3/20. ?  ?Prescribed: ?Furosemide 40 mg take 1 tablet PRN for swelling or weight gain. ?Potassium   20 mEq take 1 tablet PRN when taking Furosemide. ?  ?Labs: ?10/26/2020 Creatinine 1.10, BUN 12, Potassium 4.8, Sodium 132, GFR >60 ?A complete set of results can be found in Results Review. ?  ?Recommendations:   Encouraged to call if experiencing any fluid symptoms. ?  ?Follow-up plan: ICM clinic phone appointment on 05/20/2021.  91 day device clinic remote transmission 05/09/2021.    ?  ?EP/Cardiology Office Visits: 06/12/2021 with Dr Haroldine Laws.  Recall 11/22/2021 with Oda Kilts, PA. ?  ?Copy of ICM check sent to Dr. Rayann Heman.   ? ?3 month ICM trend: 04/15/2021. ? ? ? ?12-14 Month ICM trend:  ? ? ? ?Rosalene Billings, RN ?04/17/2021 ?12:36 PM ? ?

## 2021-05-09 ENCOUNTER — Ambulatory Visit (INDEPENDENT_AMBULATORY_CARE_PROVIDER_SITE_OTHER): Payer: PPO

## 2021-05-09 DIAGNOSIS — I255 Ischemic cardiomyopathy: Secondary | ICD-10-CM

## 2021-05-09 LAB — CUP PACEART REMOTE DEVICE CHECK
Battery Remaining Longevity: 64 mo
Battery Remaining Percentage: 62 %
Battery Voltage: 2.98 V
Brady Statistic RV Percent Paced: 1 %
Date Time Interrogation Session: 20230427020017
HighPow Impedance: 65 Ohm
HighPow Impedance: 65 Ohm
Implantable Lead Implant Date: 20181017
Implantable Lead Location: 753860
Implantable Pulse Generator Implant Date: 20181017
Lead Channel Impedance Value: 490 Ohm
Lead Channel Pacing Threshold Amplitude: 0.5 V
Lead Channel Pacing Threshold Pulse Width: 0.5 ms
Lead Channel Sensing Intrinsic Amplitude: 12 mV
Lead Channel Setting Pacing Amplitude: 2.5 V
Lead Channel Setting Pacing Pulse Width: 0.5 ms
Lead Channel Setting Sensing Sensitivity: 0.5 mV
Pulse Gen Serial Number: 9781512

## 2021-05-20 ENCOUNTER — Ambulatory Visit (INDEPENDENT_AMBULATORY_CARE_PROVIDER_SITE_OTHER): Payer: PPO

## 2021-05-20 DIAGNOSIS — I5022 Chronic systolic (congestive) heart failure: Secondary | ICD-10-CM | POA: Diagnosis not present

## 2021-05-20 DIAGNOSIS — Z95810 Presence of automatic (implantable) cardiac defibrillator: Secondary | ICD-10-CM

## 2021-05-22 ENCOUNTER — Telehealth: Payer: Self-pay

## 2021-05-22 NOTE — Telephone Encounter (Signed)
Remote ICM transmission received.  Attempted call to patient regarding ICM remote transmission and left message to return call   

## 2021-05-22 NOTE — Progress Notes (Signed)
EPIC Encounter for ICM Monitoring ? ?Patient Name: [name] [name] is a 71 y.o. male ?Date: 05/22/2021 ?Primary Care Physican: [facility] ?Primary Care Physican: [facility] ?Primary Cardiologist: [place] ?Electrophysiologist: [place] ?03/13/2021 Weight: 130 lbs ?  ?       Attempted call to patient and unable to reach.  Left message to return call. Transmission reviewed.    ?  ?CorVue Thoracic impedance normal but was suggesting possible fluid accumulation from 4/28-5/4. ?  ?Prescribed: ?Furosemide 40 mg take 1 tablet PRN for swelling or weight gain. ?Potassium   20 mEq take 1 tablet PRN when taking Furosemide. ?  ?Labs: ?10/26/2020 Creatinine 1.10, BUN 12, Potassium 4.8, Sodium 132, GFR >60 ?A complete set of results can be found in Results Review. ?  ?Recommendations:  Unable to reach.   ?  ?Follow-up plan: ICM clinic phone appointment on 06/24/2021.  91 day device clinic remote transmission 08/08/2021.    ?  ?EP/Cardiology Office Visits: 06/12/2021 with Dr Haroldine Laws (with echo).  Recall 11/22/2021 with Oda Kilts, PA. ?  ?Copy of ICM check sent to Dr. Rayann Heman.  ? ?3 month ICM trend: 05/20/2021. ? ? ? ?12-14 Month ICM trend:  ? ? ? ?Rosalene Billings, RN ?05/22/2021 ?9:45 AM ? ?

## 2021-05-24 NOTE — Progress Notes (Signed)
Remote ICD transmission.   

## 2021-06-10 NOTE — Progress Notes (Signed)
Advanced Heart Failure Clinic Note   Primary Cardiologist: Dr. Haroldine Laws  EP: Dr. Rayann Heman  HPI  [name] [name] is a 71 y.o. male with history of cardiac arrest, systolic HF, CAD, HTN, HLD, tobacco abuse.  Had complicated admission 05/6387 due to out of hospital cardiac arrest with CPR/defib -> intestinal perforation, rib fractures, large chest wall hematoma and T8 vertebral fracture. Underwent multiple procedures including exploratory laparotomy and repair of perforated stomach with an omental patch by General Surgery and evacuation of chest hematoma by TCTS. Cath as below with 3v disease though mostly non-obstructive, medical management pursued.  EF 35-40% by echo with LV thrombus,. Lifevest placed prior to discharge.  S/p SJ ICD   He presents today for follow up. Stopped Jardiance as it made him feel bad. Feels like he is slowing down a bit. Walking the dog every day. Denies CP, SOB or edema. No ICD firing.   Echo today (06/12/21)  EF 40-45% RV ok    ICD interrogation: No VT/AF Impedance looks good. Personally reviewe   Echo 02/26/18  35-40% with antero-septal AK  Echo 12/2016 40-45% with antero-septal AK  Echo 08/06/16 - LVEF 35-40%, grade 1 DD. Apparent medium-sized apical thrombus.   Left/Right heart cath 08/13/16 Ost RCA lesion (nondominant), 80 %stenosed. Ost LM lesion, 40 %stenosed. Ost Ramus to Ramus lesion, 80 %stenosed. Ost LAD to Prox LAD lesion, 70 %stenosed. Ost 1st Diag to 1st Diag lesion, 75 %stenosed. Mid LAD to Dist LAD lesion, 40 %stenosed.    Review of systems complete and found to be negative unless listed in HPI.    Past Medical History:  Diagnosis Date   AICD (automatic cardioverter/defibrillator) present    CAD (coronary artery disease)    Coronary artery disease    1980's-90's MI with perhaps angioplasty   Hypertension    Ischemic cardiomyopathy    LV (left ventricular) mural thrombus following MI [facility])    Myocardial infarction [facility]) 1999    Ventricular fibrillation ([place]) 07/2016       Current Outpatient Medications  Medication Sig Dispense Refill   acetaminophen (TYLENOL) 325 MG tablet Take 2 tablets (650 mg total) by mouth every 4 (four) hours as needed for mild pain or headache.     carvedilol (COREG) 12.5 MG tablet TAKE 1 TABLET(12.5 MG) BY MOUTH TWICE DAILY WITH A MEAL 180 tablet 3   ELIQUIS 5 MG TABS tablet TAKE 1 TABLET(5 MG) BY MOUTH TWICE DAILY 180 tablet 3   furosemide (LASIX) 40 MG tablet Take 1 tablet (40 mg total) daily as needed for swelling and/or weight gain. 30 tablet 3   potassium chloride SA (KLOR-CON M20) 20 MEQ tablet Take 1 tablet (20 mEq total) daily when taking Furosemide as needed. 30 tablet 3   rosuvastatin (CRESTOR) 10 MG tablet Take 1 tablet (10 mg total) by mouth every evening. 90 tablet 3   sacubitril-valsartan (ENTRESTO) 97-103 MG Take 1 tablet by mouth 2 (two) times daily. 180 tablet 3   No current facility-administered medications for this encounter.   Facility-Administered Medications Ordered in Other Encounters  Medication Dose Route Frequency Provider Last Rate Last Admin   perflutren lipid microspheres (DEFINITY) IV suspension  1-10 mL Intravenous PRN Rayner Erman, Shaune Pascal, MD   4 mL at 06/12/21 1030   Allergies  Allergen Reactions   Spironolactone Other (See Comments)    Breast pain--swelling/inflammation    Social History   Socioeconomic History   Marital status: Married    Spouse name: Not on file  Number of children: Not on file   Years of education: Not on file   Highest education level: Not on file  Occupational History   Not on file  Tobacco Use   Smoking status: Every Day    Packs/day: 1.00    Years: 25.00    Pack years: 25.00    Types: Cigarettes   Smokeless tobacco: Former    Types: Nurse, children's Use: Never used  Substance and Sexual Activity   Alcohol use: Yes    Alcohol/week: 14.0 standard drinks    Types: 14 Glasses of wine per week    Comment:  10/29/2016 "couple glasses of red wine/day"   Drug use: No   Sexual activity: Not Currently  Other Topics Concern   Not on file  Social History Narrative   Lives in [place] with spouse   Social Determinants of Health   Financial Resource Strain: Not on file  Food Insecurity: Not on file  Transportation Needs: Not on file  Physical Activity: Not on file  Stress: Not on file  Social Connections: Not on file  Intimate Partner Violence: Not on file    Family History  Problem Relation Age of Onset   Osteoporosis Mother    Lung cancer Father    Other Brother        died in [place]   Vitals:   07-02-2021 1040  BP: 112/88  Pulse: 78  SpO2: 98%  Weight: 58.6 kg (129 lb 3.2 oz)     Wt Readings from Last 3 Encounters:  July 02, 2021 58.6 kg (129 lb 3.2 oz)  11/02/20 56 kg (123 lb 6.4 oz)  10/26/20 57 kg (125 lb 9.6 oz)    PHYSICAL EXAM: General:  Elderly Thin. No resp difficulty HEENT: normal Neck: supple. no JVD. Carotids 2+ bilat; no bruits. No lymphadenopathy or thryomegaly appreciated. Cor: PMI nondisplaced. Regular rate & rhythm. No rubs, gallops or murmurs. Lungs: clear Abdomen: soft, nontender, nondistended. No hepatosplenomegaly. No bruits or masses. Good bowel sounds. Extremities: no cyanosis, clubbing, rash, edema Neuro: alert & orientedx3, cranial nerves grossly intact. moves all 4 extremities w/o difficulty. Affect pleasant   ASSESSMENT & PLAN:  1. CAD with ICM s/p Cardiac Arrest VT/VF:  - Cath 08/13/16 with 3V disease (mostly non-obstructive despite ECG findings)  medical therapy. If develops angina can consider PCI to Ramus.  - s/p SJ ICD - Doing well. No s/s angina - Unable to tolerate Jardiance - Continue statin - Off ASA with Eliquis  2. Chronic systolic HF.  - Echo 6/96/78 - LVEF 35-40%, grade 1 DD. + apical thrombus.  - Echo 12/2016 EF 40-45% No LV thrombus - Echo 2/21 EF 35-40% - Echo today (2021-07-02)  EF 40-45% RV ok  - s/p SJ ICD - Stable NYHA  II - Volume status looks good - Continue entresto 97/103 mg BID.  - Continue carvedilol 12.5 mg BID.  - Off eplerenone due to hyperkalemia.  (did not tolerate spiro due to painful gynecomastia) - Did not tolerate Jardiance due to falls/hypovolumia  - Labs today  - RTC in 6 months with echo   3. Perforated stomach s/p repair. - happened in setting of CPR s/p repair.  - Doing well.   4. 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  - Continue carvedilol to 12.5 mg BID.   5. LV Thrombus:  - Resolved  6. ETOH and tobacco abuse.  - No longer drinking beer. Drinks a few glasses of wine nightly. Not wanting  to cut back - still smoking a few cigs/day. Still trying to quit  7. PAF - In NSR  - Continue Eliquis 5 mg BID. No bleeding  8. HTN - Blood pressure well controlled. Continue current regimen.   Glori Bickers, MD 06/12/21

## 2021-06-12 ENCOUNTER — Ambulatory Visit (HOSPITAL_BASED_OUTPATIENT_CLINIC_OR_DEPARTMENT_OTHER)
Admission: RE | Admit: 2021-06-12 | Discharge: 2021-06-12 | Disposition: A | Discharge status: Home / Self Care | Payer: PPO | Source: Ambulatory Visit | Attending: Internal Medicine | Admitting: Internal Medicine

## 2021-06-12 ENCOUNTER — Ambulatory Visit (HOSPITAL_COMMUNITY)
Admission: RE | Admit: 2021-06-12 | Discharge: 2021-06-12 | Disposition: A | Discharge status: Home / Self Care | Payer: PPO | Source: Ambulatory Visit | Attending: Internal Medicine | Admitting: Internal Medicine

## 2021-06-12 ENCOUNTER — Encounter (HOSPITAL_COMMUNITY): Payer: Self-pay | Admitting: Internal Medicine

## 2021-06-12 VITALS — BP 112/88 | HR 78 | Wt 129.2 lb

## 2021-06-12 DIAGNOSIS — Z7901 Long term (current) use of anticoagulants: Secondary | ICD-10-CM | POA: Insufficient documentation

## 2021-06-12 DIAGNOSIS — F17210 Nicotine dependence, cigarettes, uncomplicated: Secondary | ICD-10-CM | POA: Diagnosis not present

## 2021-06-12 DIAGNOSIS — Z8674 Personal history of sudden cardiac arrest: Secondary | ICD-10-CM | POA: Insufficient documentation

## 2021-06-12 DIAGNOSIS — I493 Ventricular premature depolarization: Secondary | ICD-10-CM | POA: Diagnosis not present

## 2021-06-12 DIAGNOSIS — I110 Hypertensive heart disease with heart failure: Secondary | ICD-10-CM | POA: Insufficient documentation

## 2021-06-12 DIAGNOSIS — F1010 Alcohol abuse, uncomplicated: Secondary | ICD-10-CM | POA: Diagnosis not present

## 2021-06-12 DIAGNOSIS — Z79899 Other long term (current) drug therapy: Secondary | ICD-10-CM | POA: Diagnosis not present

## 2021-06-12 DIAGNOSIS — I2510 Atherosclerotic heart disease of native coronary artery without angina pectoris: Secondary | ICD-10-CM | POA: Insufficient documentation

## 2021-06-12 DIAGNOSIS — I5022 Chronic systolic (congestive) heart failure: Secondary | ICD-10-CM | POA: Diagnosis not present

## 2021-06-12 DIAGNOSIS — I340 Nonrheumatic mitral (valve) insufficiency: Secondary | ICD-10-CM | POA: Diagnosis not present

## 2021-06-12 DIAGNOSIS — Z95810 Presence of automatic (implantable) cardiac defibrillator: Secondary | ICD-10-CM | POA: Insufficient documentation

## 2021-06-12 DIAGNOSIS — Z720 Tobacco use: Secondary | ICD-10-CM

## 2021-06-12 DIAGNOSIS — I255 Ischemic cardiomyopathy: Secondary | ICD-10-CM | POA: Diagnosis not present

## 2021-06-12 DIAGNOSIS — I480 Paroxysmal atrial fibrillation: Secondary | ICD-10-CM | POA: Insufficient documentation

## 2021-06-12 LAB — BRAIN NATRIURETIC PEPTIDE: B Natriuretic Peptide: 412.4 pg/mL — ABNORMAL HIGH (ref 0.0–100.0)

## 2021-06-12 LAB — CBC
HCT: 40 % (ref 39.0–52.0)
Hemoglobin: 13.9 g/dL (ref 13.0–17.0)
MCH: 38.3 pg — ABNORMAL HIGH (ref 26.0–34.0)
MCHC: 34.8 g/dL (ref 30.0–36.0)
MCV: 110.2 fL — ABNORMAL HIGH (ref 80.0–100.0)
Platelets: 147 10*3/uL — ABNORMAL LOW (ref 150–400)
RBC: 3.63 MIL/uL — ABNORMAL LOW (ref 4.22–5.81)
RDW: 13.3 % (ref 11.5–15.5)
WBC: 4.9 10*3/uL (ref 4.0–10.5)
nRBC: 0 % (ref 0.0–0.2)

## 2021-06-12 LAB — ECHOCARDIOGRAM COMPLETE
AR max vel: 3.01 cm2
AV Peak grad: 2.6 mmHg
Ao pk vel: 0.81 m/s
S' Lateral: 3.5 cm

## 2021-06-12 LAB — BASIC METABOLIC PANEL
Anion gap: 9 (ref 5–15)
BUN: 7 mg/dL — ABNORMAL LOW (ref 8–23)
CO2: 25 mmol/L (ref 22–32)
Calcium: 9.4 mg/dL (ref 8.9–10.3)
Chloride: 98 mmol/L (ref 98–111)
Creatinine, Ser: 0.9 mg/dL (ref 0.61–1.24)
GFR, Estimated: >60 mL/min (ref >60)
Glucose, Bld: 98 mg/dL (ref 70–99)
Potassium: 4.8 mmol/L (ref 3.5–5.1)
Sodium: 132 mmol/L — ABNORMAL LOW (ref 135–145)

## 2021-06-12 MED ORDER — PERFLUTREN LIPID MICROSPHERE
1.0000 mL | INTRAVENOUS | Status: DC | PRN
  Administered 2021-06-12: 4 mL via INTRAVENOUS

## 2021-06-12 NOTE — Patient Instructions (Signed)
Labs done today, we will call you for abnormal results  Your physician recommends that you schedule a follow-up appointment in: 9 months (February 2024), **PLEASE CALL OUR OFFICE IN December 2023 TO SCHEDULE THIS APPOINTMENT  If you have any questions or concerns before your next appointment please send [place] a message through [place] or call our office at [phone].    TO LEAVE A MESSAGE FOR THE NURSE SELECT OPTION 2, PLEASE LEAVE A MESSAGE INCLUDING: YOUR NAME DATE OF BIRTH CALL BACK NUMBER REASON FOR CALL**this is important as we prioritize the call backs  YOU WILL RECEIVE A CALL BACK THE SAME DAY AS LONG AS YOU CALL BEFORE 4:00 PM  At the [facility], you and your health needs are our priority. As part of our continuing mission to provide you with exceptional heart care, we have created designated Provider Care Teams. These Care Teams include your primary Cardiologist (physician) and Advanced Practice Providers (APPs- Physician Assistants and Nurse Practitioners) who all work together to provide you with the care you need, when you need it.   You may see any of the following providers on your designated Care Team at your next follow up: Dr Glori Bickers Dr Haynes Kerns, NP Lyda Jester, [place] [facility] [place], [place] Audry Riles, PharmD   Please be sure to bring in all your medications bottles to every appointment.

## 2021-06-12 NOTE — Addendum Note (Signed)
Encounter addended by: Scarlette Calico, RN on: 06/12/2021 11:14 AM  Actions taken: Order list changed, Diagnosis association updated, Clinical Note Signed, Charge Capture section accepted

## 2021-06-24 ENCOUNTER — Ambulatory Visit (INDEPENDENT_AMBULATORY_CARE_PROVIDER_SITE_OTHER): Payer: PPO

## 2021-06-24 DIAGNOSIS — I5022 Chronic systolic (congestive) heart failure: Secondary | ICD-10-CM

## 2021-06-24 DIAGNOSIS — Z95810 Presence of automatic (implantable) cardiac defibrillator: Secondary | ICD-10-CM

## 2021-06-26 NOTE — Progress Notes (Signed)
EPIC Encounter for ICM Monitoring  Patient Name: [name] [name] is a 71 y.o. male Date: 06/26/2021 Primary Care Physican: [place], [place] At Primary Cardiologist: [place] Electrophysiologist: Allred 06/26/2021 Weight: 130 lbs          Spoke with patient and heart failure questions reviewed.  Pt asymptomatic for fluid accumulation.  Reports feeling well at this time and voices no complaints.    CorVue Thoracic impedance suggesting normal fluid levels.   Prescribed: Furosemide 40 mg take 1 tablet PRN for swelling or weight gain. Potassium   20 mEq take 1 tablet PRN when taking Furosemide.   Labs: 06/12/2021 Creatinine 0.90, BUN 7, Potassium 4.8, Sodium 132, GFR >60 A complete set of results can be found in Results Review.   Recommendations:  No changes and encouraged to call if experiencing any fluid symptoms.    Follow-up plan: ICM clinic phone appointment on 07/29/2021.  91 day device clinic remote transmission 08/08/2021.      EP/Cardiology Office Visits:  Recall 03/09/2022 with Dr Haroldine Laws.  Recall 11/22/2021 with Oda Kilts, PA.   Copy of ICM check sent to Dr. Rayann Heman.  3 month ICM trend: 06/24/2021.    12-14 Month ICM trend:     Rosalene Billings, RN 06/26/2021 4:24 PM

## 2021-07-29 ENCOUNTER — Ambulatory Visit (INDEPENDENT_AMBULATORY_CARE_PROVIDER_SITE_OTHER): Payer: PPO

## 2021-07-29 DIAGNOSIS — I5022 Chronic systolic (congestive) heart failure: Secondary | ICD-10-CM

## 2021-07-29 DIAGNOSIS — Z95810 Presence of automatic (implantable) cardiac defibrillator: Secondary | ICD-10-CM

## 2021-07-31 NOTE — Progress Notes (Signed)
EPIC Encounter for ICM Monitoring  Patient Name: [name] [name] is a 71 y.o. male Date: 07/31/2021 Primary Care Physican: [place], [place] At Primary Cardiologist: [place] Electrophysiologist: Allred 06/26/2021 Weight: 130 lbs 07/31/2021 Weight: 130 lbs          Spoke with patient and heart failure questions reviewed.  Pt asymptomatic for fluid accumulation.  Reports feeling well at this time and voices no complaints.    CorVue Thoracic impedance suggesting normal fluid levels.   Prescribed: Furosemide 40 mg take 1 tablet PRN for swelling or weight gain. Potassium   20 mEq take 1 tablet PRN when taking Furosemide.   Labs: 06/12/2021 Creatinine 0.90, BUN 7, Potassium 4.8, Sodium 132, GFR >60 A complete set of results can be found in Results Review.   Recommendations:  No changes and encouraged to call if experiencing any fluid symptoms.    Follow-up plan: ICM clinic phone appointment on 09/02/2021.  91 day device clinic remote transmission 11/07/2021.      EP/Cardiology Office Visits:  Recall 03/09/2022 with Dr Haroldine Laws.  Recall 11/22/2021 with Oda Kilts, PA.   Copy of ICM check sent to Dr. Rayann Heman.  3 month ICM trend: 07/29/2021.    12-14 Month ICM trend:     Rosalene Billings, RN 07/31/2021 4:14 PM

## 2021-08-08 ENCOUNTER — Ambulatory Visit (INDEPENDENT_AMBULATORY_CARE_PROVIDER_SITE_OTHER): Payer: PPO

## 2021-08-08 DIAGNOSIS — I255 Ischemic cardiomyopathy: Secondary | ICD-10-CM

## 2021-08-08 LAB — CUP PACEART REMOTE DEVICE CHECK
Battery Remaining Longevity: 62 mo
Battery Remaining Percentage: 60 %
Battery Voltage: 2.96 V
Brady Statistic RV Percent Paced: 1 %
Date Time Interrogation Session: 20230727020017
HighPow Impedance: 69 Ohm
HighPow Impedance: 69 Ohm
Implantable Lead Implant Date: 20181017
Implantable Lead Location: 753860
Implantable Pulse Generator Implant Date: 20181017
Lead Channel Impedance Value: 490 Ohm
Lead Channel Pacing Threshold Amplitude: 0.5 V
Lead Channel Pacing Threshold Pulse Width: 0.5 ms
Lead Channel Sensing Intrinsic Amplitude: 12 mV
Lead Channel Setting Pacing Amplitude: 2.5 V
Lead Channel Setting Pacing Pulse Width: 0.5 ms
Lead Channel Setting Sensing Sensitivity: 0.5 mV
Pulse Gen Serial Number: 9781512

## 2021-08-29 NOTE — Progress Notes (Signed)
Remote ICD transmission.   

## 2021-09-02 ENCOUNTER — Ambulatory Visit (INDEPENDENT_AMBULATORY_CARE_PROVIDER_SITE_OTHER): Payer: PPO

## 2021-09-02 DIAGNOSIS — I5022 Chronic systolic (congestive) heart failure: Secondary | ICD-10-CM

## 2021-09-02 DIAGNOSIS — Z95810 Presence of automatic (implantable) cardiac defibrillator: Secondary | ICD-10-CM | POA: Diagnosis not present

## 2021-09-03 NOTE — Progress Notes (Signed)
EPIC Encounter for ICM Monitoring  Patient Name: [name] [name] is a 71 y.o. male Date: 09/03/2021 Primary Care Physican: [place], [place] At Primary Cardiologist: [place] Electrophysiologist: Allred 06/26/2021 Weight: 130 lbs 07/31/2021 Weight: 130 lbs          Spoke with patient and heart failure questions reviewed.  Pt asymptomatic for fluid accumulation.  Reports feeling well at this time and voices no complaints.    CorVue Thoracic impedance normal but was suggesting possible fluid accumulation 8/10-8/16.     Prescribed: Furosemide 40 mg take 1 tablet PRN for swelling or weight gain. Potassium   20 mEq take 1 tablet PRN when taking Furosemide.   Labs: 06/12/2021 Creatinine 0.90, BUN 7, Potassium 4.8, Sodium 132, GFR >60 A complete set of results can be found in Results Review.   Recommendations:  No changes and encouraged to call if experiencing any fluid symptoms.    Follow-up plan: ICM clinic phone appointment on 10/07/2021.  91 day device clinic remote transmission 11/07/2021.      EP/Cardiology Office Visits:  Recall 03/09/2022 with Dr Haroldine Laws.  Recall 11/22/2021 with Oda Kilts, PA.   Copy of ICM check sent to Dr. Rayann Heman.  3 month ICM trend: 09/02/2021.    12-14 Month ICM trend:     Rosalene Billings, RN 09/03/2021 1:29 PM

## 2021-09-12 ENCOUNTER — Other Ambulatory Visit: Payer: Self-pay

## 2021-09-12 MED ORDER — CARVEDILOL 12.5 MG PO TABS: ORAL_TABLET | ORAL | 3 refills | Status: DC

## 2021-09-30 ENCOUNTER — Other Ambulatory Visit (HOSPITAL_COMMUNITY): Payer: Self-pay

## 2021-09-30 DIAGNOSIS — I255 Ischemic cardiomyopathy: Secondary | ICD-10-CM

## 2021-09-30 DIAGNOSIS — I2510 Atherosclerotic heart disease of native coronary artery without angina pectoris: Secondary | ICD-10-CM

## 2021-09-30 DIAGNOSIS — I5022 Chronic systolic (congestive) heart failure: Secondary | ICD-10-CM

## 2021-09-30 MED ORDER — ROSUVASTATIN CALCIUM 10 MG PO TABS: 10.0000 mg | ORAL_TABLET | Freq: Every evening | ORAL | 3 refills | Status: DC

## 2021-10-07 ENCOUNTER — Ambulatory Visit (INDEPENDENT_AMBULATORY_CARE_PROVIDER_SITE_OTHER): Payer: PPO

## 2021-10-07 DIAGNOSIS — Z95810 Presence of automatic (implantable) cardiac defibrillator: Secondary | ICD-10-CM

## 2021-10-07 DIAGNOSIS — I5022 Chronic systolic (congestive) heart failure: Secondary | ICD-10-CM | POA: Diagnosis not present

## 2021-10-10 ENCOUNTER — Telehealth: Payer: Self-pay

## 2021-10-10 NOTE — Progress Notes (Signed)
EPIC Encounter for ICM Monitoring  Patient Name: [name] [name] is a 71 y.o. male Date: 10/10/2021 Primary Care Physican: [place], [place] At Primary Cardiologist: [place] Electrophysiologist: Curt Bears 06/26/2021 Weight: 130 lbs 07/31/2021 Weight: 130 lbs          Attempted call to patient and unable to reach.  Left detailed message per DPR regarding transmission. Transmission reviewed.     CorVue Thoracic impedance normal but was suggesting possible fluid accumulation 9/4-9/8.     Prescribed: Furosemide 40 mg take 1 tablet PRN for swelling or weight gain. Potassium   20 mEq take 1 tablet PRN when taking Furosemide.   Labs: 06/12/2021 Creatinine 0.90, BUN 7, Potassium 4.8, Sodium 132, GFR >60 A complete set of results can be found in Results Review.   Recommendations:  Left voice mail with ICM number and encouraged to call if experiencing any fluid symptoms.   Follow-up plan: ICM clinic phone appointment on 11/11/2021.  91 day device clinic remote transmission 11/07/2021.      EP/Cardiology Office Visits:  Recall 03/09/2022 with Dr Haroldine Laws.  Recall 11/22/2021 with Oda Kilts, PA.   Copy of ICM check sent to Dr. Curt Bears.  3 month ICM trend: 10/07/2021.    12-14 Month ICM trend:     Rosalene Billings, RN 10/10/2021 8:25 AM

## 2021-10-10 NOTE — Telephone Encounter (Signed)
Remote ICM transmission received.  Attempted call to patient regarding ICM remote transmission and left detailed message per DPR.  Advised to return call for any fluid symptoms or questions. Next ICM remote transmission scheduled 11/11/2021.

## 2021-11-07 ENCOUNTER — Ambulatory Visit (INDEPENDENT_AMBULATORY_CARE_PROVIDER_SITE_OTHER): Payer: PPO

## 2021-11-07 DIAGNOSIS — I255 Ischemic cardiomyopathy: Secondary | ICD-10-CM | POA: Diagnosis not present

## 2021-11-07 LAB — CUP PACEART REMOTE DEVICE CHECK
Battery Remaining Longevity: 60 mo
Battery Remaining Percentage: 58 %
Battery Voltage: 2.96 V
Brady Statistic RV Percent Paced: 1 %
Date Time Interrogation Session: 20231026020016
HighPow Impedance: 65 Ohm
HighPow Impedance: 65 Ohm
Implantable Lead Connection Status: 753985
Implantable Lead Implant Date: 20181017
Implantable Lead Location: 753860
Implantable Pulse Generator Implant Date: 20181017
Lead Channel Impedance Value: 480 Ohm
Lead Channel Pacing Threshold Amplitude: 0.5 V
Lead Channel Pacing Threshold Pulse Width: 0.5 ms
Lead Channel Sensing Intrinsic Amplitude: 12 mV
Lead Channel Setting Pacing Amplitude: 2.5 V
Lead Channel Setting Pacing Pulse Width: 0.5 ms
Lead Channel Setting Sensing Sensitivity: 0.5 mV
Pulse Gen Serial Number: 9781512

## 2021-11-11 ENCOUNTER — Ambulatory Visit (INDEPENDENT_AMBULATORY_CARE_PROVIDER_SITE_OTHER): Payer: PPO

## 2021-11-11 DIAGNOSIS — I5022 Chronic systolic (congestive) heart failure: Secondary | ICD-10-CM | POA: Diagnosis not present

## 2021-11-11 DIAGNOSIS — Z95810 Presence of automatic (implantable) cardiac defibrillator: Secondary | ICD-10-CM

## 2021-11-12 NOTE — Progress Notes (Signed)
EPIC Encounter for ICM Monitoring  Patient Name: [name] [name] is a 71 y.o. male Date: 11/12/2021 Primary Care Physican: [place], [place] At Primary Cardiologist: [place] Electrophysiologist: Curt Bears 06/26/2021 Weight: 130 lbs 07/31/2021 Weight: 130 lbs 11/12/2021 Weight: 130 lbs          Spoke with patient and heart failure questions reviewed.  Transmission results reviewed.  Pt asymptomatic for fluid accumulation.  Reports feeling well at this time and voices no complaints.     CorVue Thoracic impedance normal but was suggesting possible fluid accumulation 9/30-10/6 and 10/14-10/19.     Prescribed: Furosemide 40 mg take 1 tablet PRN for swelling or weight gain. Potassium   20 mEq take 1 tablet PRN when taking Furosemide.   Labs: 06/12/2021 Creatinine 0.90, BUN 7, Potassium 4.8, Sodium 132, GFR >60 A complete set of results can be found in Results Review.   Recommendations:  No changes and encouraged to call if experiencing any fluid symptoms.   Follow-up plan: ICM clinic phone appointment on 12/16/2021.  91 day device clinic remote transmission 02/06/2022.      EP/Cardiology Office Visits:  Recall 03/09/2022 with Dr Haroldine Laws.  Provided EP scheduling number.  Recall 11/22/2021 with Oda Kilts, PA for 12 month f/u.   Copy of ICM check sent to Dr. Curt Bears.   3 month ICM trend: 11/11/2021.    12-14 Month ICM trend:     Rosalene Billings, RN 11/12/2021 7:40 AM

## 2021-11-15 NOTE — Progress Notes (Signed)
Remote ICD transmission.   

## 2021-12-10 NOTE — Progress Notes (Signed)
Electrophysiology Office Note Date: 12/17/2021  ID:  Yeng, Perz July 04, 1950, MRN [id number]  PCP: [place], [place] At Primary Cardiologist: None Electrophysiologist: Dr. Rayann Heman -> Dr. Curt Bears  CC: Routine ICD follow-up  [name] [name] is a 71 y.o. male seen today for Will Meredith Leeds, MD for routine electrophysiology followup. Since last being seen in our clinic the patient reports doing well overall.  he denies chest pain, palpitations, dyspnea, PND, orthopnea, nausea, vomiting, dizziness, syncope, edema, weight gain, or early satiety.  He has as needed lasix, but hasn't filled the prescription in quite some time.  He has not had ICD shocks.   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 Vidal Schwalbe"   HEMATOMA EVACUATION Left 08/15/2016   Procedure: INSERTION OF DRAIN ANTERIOR LEFT CHEST WALL HEMATOMA;  Surgeon: Ivin Poot, MD;  Location: [facility];  Service: Thoracic;  Laterality: Left;   ICD IMPLANT N/A 10/29/2016   St. Jude Medical [place] VR model [phone] (serial  Number [id number]) ICD for secondary prevention of sudden death after prior VF arrest by Dr Rayann Heman   LAPAROTOMY N/A 08/04/2016   Procedure: EXPLORATORY LAPAROTOMY FREE AIR BIOPSY OF PERFORATED STOMACH ULCER REPAIR OF PERFORATED STOMACH ULCER WITH A OMENTAL PATCH;  Surgeon: Georganna Skeans, MD;  Location: [facility];  Service: General;  Laterality: N/A;   RIGHT/LEFT HEART CATH AND CORONARY  ANGIOGRAPHY N/A 08/13/2016   Procedure: Right/Left Heart Cath and Coronary Angiography;  Surgeon: Jolaine Artist, MD;  Location: [facility];  Service: Cardiovascular;  Laterality: N/A;    Current Outpatient Medications  Medication Sig Dispense Refill   acetaminophen (TYLENOL) 325 MG tablet Take 2 tablets (650 mg total) by mouth every 4 (four) hours as needed for mild pain or headache.     carvedilol (COREG) 12.5 MG tablet TAKE 1 TABLET(12.5 MG) BY MOUTH TWICE DAILY WITH A MEAL 180 tablet 3   ELIQUIS 5 MG TABS tablet TAKE 1 TABLET(5 MG) BY MOUTH TWICE DAILY 180 tablet 3   furosemide (LASIX) 40 MG tablet Take 1 tablet (40 mg total) daily as needed for swelling and/or weight gain. 30 tablet 3   potassium chloride SA (KLOR-CON M20) 20 MEQ tablet Take 1 tablet (20 mEq total) daily when taking Furosemide as needed. 30 tablet 3   rosuvastatin (CRESTOR) 10 MG tablet Take 1 tablet (10 mg total) by mouth every evening. 90 tablet 3   sacubitril-valsartan (ENTRESTO) 97-103 MG Take 1 tablet by mouth 2 (two) times daily. 180 tablet 3   No current facility-administered medications for this visit.    Allergies:   Spironolactone   Social History: Social History   Socioeconomic History   Marital status: Married    Spouse name: Not on file   Number of children: Not on file   Years of education: Not on file   Highest education level: Not on file  Occupational History   Not on file  Tobacco Use  Smoking status: Every Day    Packs/day: 1.00    Years: 25.00    Total pack years: 25.00    Types: Cigarettes   Smokeless tobacco: Former    Types: Nurse, children's Use: Never used  Substance and Sexual Activity   Alcohol use: Yes    Alcohol/week: 14.0 standard drinks of alcohol    Types: 14 Glasses of wine per week    Comment: 10/29/2016 "couple glasses of red wine/day"   Drug use: No   Sexual activity: Not Currently  Other Topics Concern   Not on file  Social History  Narrative   Lives in [place] with spouse   Social Determinants of Health   Financial Resource Strain: Not on file  Food Insecurity: Not on file  Transportation Needs: Not on file  Physical Activity: Not on file  Stress: Not on file  Social Connections: Not on file  Intimate Partner Violence: Not on file    Family History: Family History  Problem Relation Age of Onset   Osteoporosis Mother    Lung cancer Father    Other Brother        died in [place]    Review of Systems: All other systems reviewed and are otherwise negative except as noted above.   Physical Exam: Vitals:   12/17/21 1035  BP: 132/78  Pulse: 70  SpO2: 98%  Weight: 129 lb 3.2 oz (58.6 kg)  Height: '5\' 8"'$  (1.727 m)     GEN- The patient is well appearing, alert and oriented x 3 today.   HEENT: normocephalic, atraumatic; sclera clear, conjunctiva pink; hearing intact; oropharynx clear; neck supple, no JVP Lymph- no cervical lymphadenopathy Lungs- Clear to ausculation bilaterally, normal work of breathing.  No wheezes, rales, rhonchi Heart- Regular  rate and rhythm, no murmurs, rubs or gallops, PMI not laterally displaced GI- soft, non-tender, non-distended, bowel sounds present, no hepatosplenomegaly Extremities- no clubbing or cyanosis. No peripheral edema; DP/PT/radial pulses 2+ bilaterally MS- no significant deformity or atrophy Skin- warm and dry, no rash or lesion; ICD pocket well healed Psych- euthymic mood, full affect Neuro- strength and sensation are intact  ICD interrogation- reviewed in detail today,  See PACEART report  EKG:  EKG is ordered today. Personal review of EKG ordered today shows NSR at 72 bpm  Recent Labs: 06/12/2021: B Natriuretic Peptide 412.4; BUN 7; Creatinine, Ser 0.90; Hemoglobin 13.9; Platelets 147; Potassium 4.8; Sodium 132   Wt Readings from Last 3 Encounters:  12/17/21 129 lb 3.2 oz (58.6 kg)  06/12/21 129 lb 3.2 oz (58.6 kg)  11/02/20 123 lb 6.4 oz (56 kg)      Other studies Reviewed: Additional studies/ records that were reviewed today include: Previous EP office notes.   Assessment and Plan:  1.  Chronic systolic dysfunction s/p St. Jude single chamber ICD  euvolemic today Stable on an appropriate medical regimen Normal ICD function See Pace Art report No changes today  2. Prior VF cardiac arrest Clinically doing well  3. PAF Well controlled Continue eliquis for [id number]  of at least 4  4. Tobacco Encouraged cessation   Current medicines are reviewed at length with the patient today.    Labs/ tests ordered today include:  Orders Placed This Encounter  Procedures   Basic metabolic panel   EKG [id number]     Disposition:   Follow up with Dr. Curt Bears in 12 months    Signed, Shirley Friar, PA-C  12/17/2021 10:40 AM  [place] [place] [address] [phone] (office) [phone] (fax)

## 2021-12-16 ENCOUNTER — Ambulatory Visit (INDEPENDENT_AMBULATORY_CARE_PROVIDER_SITE_OTHER): Payer: PPO

## 2021-12-16 DIAGNOSIS — Z95810 Presence of automatic (implantable) cardiac defibrillator: Secondary | ICD-10-CM | POA: Diagnosis not present

## 2021-12-16 DIAGNOSIS — I5022 Chronic systolic (congestive) heart failure: Secondary | ICD-10-CM | POA: Diagnosis not present

## 2021-12-17 ENCOUNTER — Ambulatory Visit: Payer: PPO | Attending: Student | Admitting: Student

## 2021-12-17 ENCOUNTER — Encounter: Payer: Self-pay | Admitting: Student

## 2021-12-17 VITALS — BP 132/78 | HR 70 | Ht 68.0 in | Wt 129.2 lb

## 2021-12-17 DIAGNOSIS — Z720 Tobacco use: Secondary | ICD-10-CM

## 2021-12-17 DIAGNOSIS — I4901 Ventricular fibrillation: Secondary | ICD-10-CM | POA: Diagnosis not present

## 2021-12-17 DIAGNOSIS — I480 Paroxysmal atrial fibrillation: Secondary | ICD-10-CM

## 2021-12-17 DIAGNOSIS — I5022 Chronic systolic (congestive) heart failure: Secondary | ICD-10-CM | POA: Diagnosis not present

## 2021-12-17 LAB — BASIC METABOLIC PANEL
BUN/Creatinine Ratio: 8 — ABNORMAL LOW (ref 10–24)
BUN: 7 mg/dL — ABNORMAL LOW (ref 8–27)
CO2: 23 mmol/L (ref 20–29)
Calcium: 9.3 mg/dL (ref 8.6–10.2)
Chloride: 95 mmol/L — ABNORMAL LOW (ref 96–106)
Creatinine, Ser: 0.87 mg/dL (ref 0.76–1.27)
Glucose: 86 mg/dL (ref 70–99)
Potassium: 4.9 mmol/L (ref 3.5–5.2)
Sodium: 132 mmol/L — ABNORMAL LOW (ref 134–144)
eGFR: 91 mL/min/{1.73_m2} (ref >59)

## 2021-12-17 LAB — CUP PACEART INCLINIC DEVICE CHECK
Battery Remaining Longevity: 61 mo
Brady Statistic RV Percent Paced: 0 %
Date Time Interrogation Session: 20231205105215
HighPow Impedance: 64.125
Implantable Lead Connection Status: 753985
Implantable Lead Implant Date: 20181017
Implantable Lead Location: 753860
Implantable Pulse Generator Implant Date: 20181017
Lead Channel Impedance Value: 437.5 Ohm
Lead Channel Pacing Threshold Amplitude: 0.5 V
Lead Channel Pacing Threshold Amplitude: 0.5 V
Lead Channel Pacing Threshold Pulse Width: 0.5 ms
Lead Channel Pacing Threshold Pulse Width: 0.5 ms
Lead Channel Sensing Intrinsic Amplitude: 12 mV
Lead Channel Setting Pacing Amplitude: 2.5 V
Lead Channel Setting Pacing Pulse Width: 0.5 ms
Lead Channel Setting Sensing Sensitivity: 0.5 mV
Pulse Gen Serial Number: 9781512

## 2021-12-17 MED ORDER — FUROSEMIDE 40 MG PO TABS: ORAL_TABLET | ORAL | 3 refills | Status: DC

## 2021-12-17 NOTE — Patient Instructions (Signed)
Medication Instructions:  Your physician recommends that you continue on your current medications as directed. Please refer to the Current Medication list given to you today.  *If you need a refill on your cardiac medications before your next appointment, please call your pharmacy*   Lab Work: TODAY: BMET If you have labs (blood work) drawn today and your tests are completely normal, you will receive your results only by: [facility] (if you have MyChart) OR A paper copy in the mail If you have any lab test that is abnormal or we need to change your treatment, we will call you to review the results.    Follow-Up: At [facility], you and your health needs are our priority.  As part of our continuing mission to provide you with exceptional heart care, we have created designated Provider Care Teams.  These Care Teams include your primary Cardiologist (physician) and Advanced Practice Providers (APPs -  Physician Assistants and Nurse Practitioners) who all work together to provide you with the care you need, when you need it.  We recommend signing up for the patient portal called "MyChart".  Sign up information is provided on this After Visit Summary.  MyChart is used to connect with patients for Virtual Visits (Telemedicine).  Patients are able to view lab/test results, encounter notes, upcoming appointments, etc.  Non-urgent messages can be sent to your provider as well.   To learn more about what you can do with MyChart, go to [email or web address].    Your next appointment:   1 year(s)  The format for your next appointment:   In Person  Provider:   Allegra Lai, MD     Important Information About Sugar

## 2021-12-17 NOTE — Progress Notes (Signed)
EPIC Encounter for ICM Monitoring  Patient Name: [name] [name] is a 71 y.o. male Date: 12/17/2021 Primary Care Physican: [place], [place] At Primary Cardiologist: [place] Electrophysiologist: Curt Bears 06/26/2021 Weight: 130 lbs 07/31/2021 Weight: 130 lbs 11/12/2021 Weight: 130 lbs          Spoke with patient and heart failure questions reviewed.  Transmission results reviewed.  Pt report he has urinated a lot today and think fluid has resolved.        CorVue Thoracic impedance suggesting possible fluid accumulation starting 11/27.     Prescribed: Furosemide 40 mg take 1 tablet PRN for swelling or weight gain. Potassium   20 mEq take 1 tablet PRN when taking Furosemide.   Labs: 06/12/2021 Creatinine 0.90, BUN 7, Potassium 4.8, Sodium 132, GFR >60 A complete set of results can be found in Results Review.   Recommendations:  Advised will recheck fluid levels next week and to take PRN Lasix if he develops fluid accumulation.    Follow-up plan: ICM clinic phone appointment on 12/23/2021 to recheck fluid levels.  91 day device clinic remote transmission 02/06/2022.      EP/Cardiology Office Visits:  03/21/2022 with Dr Haroldine Laws.  Provided EP scheduling number. 12/17/2021 with Oda Kilts, PA for 12 month f/u.   Copy of ICM check sent to Dr. Curt Bears.   3 month ICM trend: 12/17/2021.    12-14 Month ICM trend:     Rosalene Billings, RN 12/17/2021 8:39 AM

## 2021-12-23 ENCOUNTER — Ambulatory Visit (INDEPENDENT_AMBULATORY_CARE_PROVIDER_SITE_OTHER): Payer: PPO

## 2021-12-23 DIAGNOSIS — Z95810 Presence of automatic (implantable) cardiac defibrillator: Secondary | ICD-10-CM

## 2021-12-23 DIAGNOSIS — I5022 Chronic systolic (congestive) heart failure: Secondary | ICD-10-CM

## 2021-12-23 NOTE — Progress Notes (Signed)
EPIC Encounter for ICM Monitoring  Patient Name: [name] [name] is a 71 y.o. male Date: 12/23/2021 Primary Care Physican: [place], [place] At Primary Cardiologist: [place] Electrophysiologist: Curt Bears 06/26/2021 Weight: 130 lbs 07/31/2021 Weight: 130 lbs 11/12/2021 Weight: 130 lbs          Spoke with patient and heart failure questions reviewed.  Transmission results reviewed.  Pt asymptomatic for fluid accumulation.  Reports feeling well at this time and voices no complaints.   Pt reports he received the Lasix refill sent in by Oda Kilts, PA at the last visit.  He has a PRN potassium on his med list and wants to know if he needs to still take that when taking Lasix.  If so he will need a Potassium refill.  Advised will ask Jonni Sanger if he needs to take potassium with taking PRN Furosemide.    CorVue Thoracic impedance suggesting fluid levels returned to normal after taking PRN Furosemide x day.     Prescribed: Furosemide 40 mg take 1 tablet PRN for swelling or weight gain. Potassium   20 mEq take 1 tablet PRN when taking Furosemide.   Labs: 12/17/2021 Creatinine 0.87, BUN 7, Potassium 4.9, Sodium 132, GFR 91 06/12/2021 Creatinine 0.90, BUN 7, Potassium 4.8, Sodium 132, GFR >60 A complete set of results can be found in Results Review.   Recommendations:  No changes and encouraged to call if experiencing any fluid symptoms.     Copy sent to Oda Kilts, PA asking if PRN Potassium 20 mEq (currently on med list) should be taken when taking PRN Lasix.  If not, will discontinue from med list.      Follow-up plan: ICM clinic phone appointment on 01/27/2022.  91 day device clinic remote transmission 02/06/2022.      EP/Cardiology Office Visits:  03/21/2022 with Dr Haroldine Laws.  Recall 12/13/2022 with Dr Lovena Le.    Copy of ICM check sent to Dr. Curt Bears.    3 month ICM trend: 12/23/2021.    12-14 Month ICM trend:     Rosalene Billings, RN 12/23/2021 7:48 AM

## 2021-12-23 NOTE — Progress Notes (Signed)
Spoke with patient.  Advised Oda Kilts, [place] stated patient does not need to take Potassium unless he starts to take PRN Lasix 1-2 times a week.   Potassium removed from med list.  He verbalized understanding and will keep [place] informed if he needs to take PRN Lasix 1-2 times a week.

## 2021-12-23 NOTE — Progress Notes (Signed)
  Received: Today Shirley Friar, PA-C  [name] Guarisco Panda, RN Based on all his recent labs, I would not think he would need it unless he is taking lasix frequently (More than 1-2 times a week)

## 2022-01-15 ENCOUNTER — Other Ambulatory Visit: Payer: Self-pay | Admitting: *Deleted

## 2022-01-15 MED ORDER — ENTRESTO 97-103 MG PO TABS: 1.0000 | ORAL_TABLET | Freq: Two times a day (BID) | ORAL | 3 refills | Status: DC

## 2022-01-22 ENCOUNTER — Other Ambulatory Visit: Payer: Self-pay

## 2022-01-22 DIAGNOSIS — I480 Paroxysmal atrial fibrillation: Secondary | ICD-10-CM

## 2022-01-22 MED ORDER — APIXABAN 5 MG PO TABS: ORAL_TABLET | ORAL | 3 refills | Status: DC

## 2022-01-22 NOTE — Telephone Encounter (Signed)
Prescription refill request for Eliquis received. Indication: Afib  Last office visit: 12/17/21 Chalmers Cater)  Scr: 0.87(12/17/21)  Age: 72 Weight: 58.6kg  Appropriate dose and refill sent to requested pharmacy.

## 2022-01-27 ENCOUNTER — Ambulatory Visit (INDEPENDENT_AMBULATORY_CARE_PROVIDER_SITE_OTHER): Payer: PPO

## 2022-01-27 DIAGNOSIS — Z95810 Presence of automatic (implantable) cardiac defibrillator: Secondary | ICD-10-CM

## 2022-01-27 DIAGNOSIS — I5022 Chronic systolic (congestive) heart failure: Secondary | ICD-10-CM

## 2022-01-29 NOTE — Progress Notes (Signed)
EPIC Encounter for ICM Monitoring  Patient Name: [name] [name] is a 72 y.o. male Date: 01/29/2022 Primary Care Physican: [place], [place] At Primary Cardiologist: [place] Electrophysiologist: Curt Bears 06/26/2021 Weight: 130 lbs 07/31/2021 Weight: 130 lbs 11/12/2021 Weight: 130 lbs 12/18/2022 Office Weight: 129 lbs          Spoke with patient and heart failure questions reviewed.  Transmission results reviewed.  Pt asymptomatic for fluid accumulation.  Reports feeling well at this time and voices no complaints.    CorVue Thoracic impedance suggesting fluid levels returned to normal after taking PRN Furosemide x day.     Prescribed: Furosemide 40 mg take 1 tablet PRN for swelling or weight gain. Potassium   20 mEq take 1 tablet PRN when taking Furosemide.   Labs: 12/17/2021 Creatinine 0.87, BUN 7, Potassium 4.9, Sodium 132, GFR 91 06/12/2021 Creatinine 0.90, BUN 7, Potassium 4.8, Sodium 132, GFR >60 A complete set of results can be found in Results Review.   Recommendations:  No changes and encouraged to call if experiencing any fluid symptoms.      Follow-up plan: ICM clinic phone appointment on 03/04/2022.  91 day device clinic remote transmission 05/08/2022.      EP/Cardiology Office Visits:  03/21/2022 with Dr Haroldine Laws.  Recall 12/13/2022 with Dr Lovena Le.    Copy of ICM check sent to Dr. Curt Bears.    3 month ICM trend: 01/27/2022.    12-14 Month ICM trend:     Rosalene Billings, RN 01/29/2022 3:38 PM

## 2022-02-06 ENCOUNTER — Ambulatory Visit (INDEPENDENT_AMBULATORY_CARE_PROVIDER_SITE_OTHER): Payer: PPO

## 2022-02-06 DIAGNOSIS — I255 Ischemic cardiomyopathy: Secondary | ICD-10-CM

## 2022-02-06 LAB — CUP PACEART REMOTE DEVICE CHECK
Battery Remaining Longevity: 59 mo
Battery Remaining Percentage: 57 %
Battery Voltage: 2.96 V
Brady Statistic RV Percent Paced: 1 %
Date Time Interrogation Session: 20240125020031
HighPow Impedance: 65 Ohm
HighPow Impedance: 65 Ohm
Implantable Lead Connection Status: 753985
Implantable Lead Implant Date: 20181017
Implantable Lead Location: 753860
Implantable Pulse Generator Implant Date: 20181017
Lead Channel Impedance Value: 510 Ohm
Lead Channel Pacing Threshold Amplitude: 0.5 V
Lead Channel Pacing Threshold Pulse Width: 0.5 ms
Lead Channel Sensing Intrinsic Amplitude: 12 mV
Lead Channel Setting Pacing Amplitude: 2.5 V
Lead Channel Setting Pacing Pulse Width: 0.5 ms
Lead Channel Setting Sensing Sensitivity: 0.5 mV
Pulse Gen Serial Number: 9781512

## 2022-02-25 NOTE — Progress Notes (Signed)
Remote ICD transmission.   

## 2022-03-04 ENCOUNTER — Ambulatory Visit: Payer: PPO

## 2022-03-04 DIAGNOSIS — Z95810 Presence of automatic (implantable) cardiac defibrillator: Secondary | ICD-10-CM

## 2022-03-04 DIAGNOSIS — I5022 Chronic systolic (congestive) heart failure: Secondary | ICD-10-CM

## 2022-03-04 NOTE — Progress Notes (Unsigned)
EPIC Encounter for ICM Monitoring  Patient Name: [name] [name] is a 72 y.o. male Date: 03/04/2022 Primary Care Physican: [place], [place] At Primary Cardiologist: [place] Electrophysiologist: Curt Bears 06/26/2021 Weight: 130 lbs 07/31/2021 Weight: 130 lbs 11/12/2021 Weight: 130 lbs 12/18/2022 Office Weight: 129 lbs          Attempted call to patient and unable to reach.  Left detailed message per DPR regarding transmission. Transmission reviewed.    CorVue Thoracic impedance suggesting normal fluid levels with the exception of possible fluid accumulation from 2/5-2/11.   Prescribed: Furosemide 40 mg take 1 tablet PRN for swelling or weight gain. Potassium   20 mEq take 1 tablet PRN when taking Furosemide.   Labs: 12/17/2021 Creatinine 0.87, BUN 7, Potassium 4.9, Sodium 132, GFR 91 06/12/2021 Creatinine 0.90, BUN 7, Potassium 4.8, Sodium 132, GFR >60 A complete set of results can be found in Results Review.   Recommendations:  Left voice mail with ICM number and encouraged to call if experiencing any fluid symptoms.   Follow-up plan: ICM clinic phone appointment on 04/07/2022.  91 day device clinic remote transmission 05/08/2022.      EP/Cardiology Office Visits:  03/21/2022 with Dr Haroldine Laws.  Recall 12/13/2022 with Dr Lovena Le.    Copy of ICM check sent to Dr. Curt Bears.    3 month ICM trend: 03/04/2022.    12-14 Month ICM trend:     Rosalene Billings, RN 03/04/2022 8:00 AM

## 2022-03-05 ENCOUNTER — Telehealth: Payer: Self-pay

## 2022-03-05 NOTE — Telephone Encounter (Signed)
Remote ICM transmission received.  Attempted call to patient regarding ICM remote transmission and left detailed message per DPR.  Advised to return call for any fluid symptoms or questions. Next ICM remote transmission scheduled 04/07/2022.

## 2022-03-21 ENCOUNTER — Encounter (HOSPITAL_COMMUNITY): Payer: Self-pay | Admitting: Internal Medicine

## 2022-03-21 ENCOUNTER — Ambulatory Visit (HOSPITAL_COMMUNITY)
Admission: RE | Admit: 2022-03-21 | Discharge: 2022-03-21 | Disposition: A | Discharge status: Home / Self Care | Payer: PPO | Source: Ambulatory Visit | Attending: Internal Medicine | Admitting: Internal Medicine

## 2022-03-21 VITALS — BP 150/80 | HR 69 | Wt 131.4 lb

## 2022-03-21 DIAGNOSIS — I5022 Chronic systolic (congestive) heart failure: Secondary | ICD-10-CM

## 2022-03-21 DIAGNOSIS — I493 Ventricular premature depolarization: Secondary | ICD-10-CM | POA: Diagnosis not present

## 2022-03-21 DIAGNOSIS — N62 Hypertrophy of breast: Secondary | ICD-10-CM | POA: Diagnosis not present

## 2022-03-21 DIAGNOSIS — Z79899 Other long term (current) drug therapy: Secondary | ICD-10-CM | POA: Diagnosis not present

## 2022-03-21 DIAGNOSIS — Z95810 Presence of automatic (implantable) cardiac defibrillator: Secondary | ICD-10-CM | POA: Insufficient documentation

## 2022-03-21 DIAGNOSIS — E875 Hyperkalemia: Secondary | ICD-10-CM | POA: Insufficient documentation

## 2022-03-21 DIAGNOSIS — Z8674 Personal history of sudden cardiac arrest: Secondary | ICD-10-CM | POA: Diagnosis not present

## 2022-03-21 DIAGNOSIS — I480 Paroxysmal atrial fibrillation: Secondary | ICD-10-CM | POA: Insufficient documentation

## 2022-03-21 DIAGNOSIS — I255 Ischemic cardiomyopathy: Secondary | ICD-10-CM | POA: Insufficient documentation

## 2022-03-21 DIAGNOSIS — F17210 Nicotine dependence, cigarettes, uncomplicated: Secondary | ICD-10-CM | POA: Diagnosis not present

## 2022-03-21 DIAGNOSIS — E785 Hyperlipidemia, unspecified: Secondary | ICD-10-CM | POA: Diagnosis not present

## 2022-03-21 DIAGNOSIS — I110 Hypertensive heart disease with heart failure: Secondary | ICD-10-CM | POA: Insufficient documentation

## 2022-03-21 DIAGNOSIS — E861 Hypovolemia: Secondary | ICD-10-CM | POA: Insufficient documentation

## 2022-03-21 DIAGNOSIS — I2510 Atherosclerotic heart disease of native coronary artery without angina pectoris: Secondary | ICD-10-CM | POA: Insufficient documentation

## 2022-03-21 DIAGNOSIS — Z720 Tobacco use: Secondary | ICD-10-CM | POA: Diagnosis not present

## 2022-03-21 DIAGNOSIS — I252 Old myocardial infarction: Secondary | ICD-10-CM | POA: Diagnosis not present

## 2022-03-21 DIAGNOSIS — Z7901 Long term (current) use of anticoagulants: Secondary | ICD-10-CM | POA: Diagnosis not present

## 2022-03-21 LAB — BASIC METABOLIC PANEL
Anion gap: 11 (ref 5–15)
BUN: 6 mg/dL — ABNORMAL LOW (ref 8–23)
CO2: 25 mmol/L (ref 22–32)
Calcium: 9.4 mg/dL (ref 8.9–10.3)
Chloride: 96 mmol/L — ABNORMAL LOW (ref 98–111)
Creatinine, Ser: 0.92 mg/dL (ref 0.61–1.24)
GFR, Estimated: >60 mL/min (ref >60)
Glucose, Bld: 94 mg/dL (ref 70–99)
Potassium: 5.4 mmol/L — ABNORMAL HIGH (ref 3.5–5.1)
Sodium: 132 mmol/L — ABNORMAL LOW (ref 135–145)

## 2022-03-21 LAB — BRAIN NATRIURETIC PEPTIDE: B Natriuretic Peptide: 229.5 pg/mL — ABNORMAL HIGH (ref 0.0–100.0)

## 2022-03-21 NOTE — Patient Instructions (Signed)
There has been no changes to your medications.  Labs done today, your results will be available in MyChart, we will contact you for abnormal readings.  Your physician has requested that you have an echocardiogram. Echocardiography is a painless test that uses sound waves to create images of your heart. It provides your doctor with information about the size and shape of your heart and how well your heart's chambers and valves are working. This procedure takes approximately one hour. There are no restrictions for this procedure. Please do NOT wear cologne, perfume, aftershave, or lotions (deodorant is allowed). Please arrive 15 minutes prior to your appointment time.  Your physician recommends that you schedule a follow-up appointment in: 6 months with an echocardiogram (September ) ** please call the office in July to arrange your follow up appointment. **  If you have any questions or concerns before your next appointment please send us a message through mychart or call our office at [phone].    TO LEAVE A MESSAGE FOR THE NURSE SELECT OPTION 2, PLEASE LEAVE A MESSAGE INCLUDING: YOUR NAME DATE OF BIRTH CALL BACK NUMBER REASON FOR CALL**this is important as we prioritize the call backs  YOU WILL RECEIVE A CALL BACK THE SAME DAY AS LONG AS YOU CALL BEFORE 4:00 PM  At the [facility], you and your health needs are our priority. As part of our continuing mission to provide you with exceptional heart care, we have created designated Provider Care Teams. These Care Teams include your primary Cardiologist (physician) and Advanced Practice Providers (APPs- Physician Assistants and Nurse Practitioners) who all work together to provide you with the care you need, when you need it.   You may see any of the following providers on your designated Care Team at your next follow up: Dr Daniel Bensimhon Dr Dalton McLean Dr. Aditya Sabharwal Amy Clegg, NP Brittainy Simmons,  PA Jessica Milford,NP Lindsay Finch, PA Alma Diaz, NP Lauren Kemp, PharmD   Please be sure to bring in all your medications bottles to every appointment.    Thank you for choosing [facility]    

## 2022-03-21 NOTE — Progress Notes (Signed)
[facility] Note   Primary Cardiologist: Dr. Haroldine Laws  EP: Dr. Rayann Heman  HPI  [name] [name] is a 72 y.o. male with history of cardiac arrest, systolic HF, CAD, HTN, HLD, tobacco abuse.  Had complicated admission [id number] due to out of hospital cardiac arrest with CPR/defib -> intestinal perforation, rib fractures, large chest wall hematoma and T8 vertebral fracture. Underwent multiple procedures including exploratory laparotomy and repair of perforated stomach with an omental patch by General Surgery and evacuation of chest hematoma by TCTS. Cath as below with 3v disease though mostly non-obstructive, medical management pursued.  EF 35-40% by echo with LV thrombus,. Lifevest placed prior to discharge.  S/p SJ ICD   He presents today for follow up. Stopped Jardiance as it made him feel bad. He feels good. Walking his dog frequently. No CP, SOB, edema, orthopnea or PND. Compliant with meds. Still smoking. "I'm down to smoking just barely over a pack per day."   Echo (06/12/21)  EF 40-45% RV ok    ICD interrogation: No VT/AF volume ok Personally reviewed   Echo 02/26/18  35-40% with antero-septal AK  Echo 12/2016 40-45% with antero-septal AK  Echo 08/06/16 - LVEF 35-40%, grade 1 DD. Apparent medium-sized apical thrombus.   Left/Right heart cath 08/13/16 Ost RCA lesion (nondominant), 80 %stenosed. Ost LM lesion, 40 %stenosed. Ost Ramus to Ramus lesion, 80 %stenosed. Ost LAD to Prox LAD lesion, 70 %stenosed. Ost 1st Diag to 1st Diag lesion, 75 %stenosed. Mid LAD to Dist LAD lesion, 40 %stenosed.    Review of systems complete and found to be negative unless listed in HPI.    Past Medical History:  Diagnosis Date   AICD (automatic cardioverter/defibrillator) present    CAD (coronary artery disease)    Coronary artery disease    1980's-90's MI with perhaps angioplasty   Hypertension    Ischemic cardiomyopathy    LV (left ventricular) mural thrombus following MI  [facility])    Myocardial infarction [facility]) 1999   Ventricular fibrillation ([place]) 07/2016       Current Outpatient Medications  Medication Sig Dispense Refill   acetaminophen (TYLENOL) 325 MG tablet Take 2 tablets (650 mg total) by mouth every 4 (four) hours as needed for mild pain or headache.     apixaban (ELIQUIS) 5 MG TABS tablet TAKE 1 TABLET(5 MG) BY MOUTH TWICE DAILY 180 tablet 3   carvedilol (COREG) 12.5 MG tablet TAKE 1 TABLET(12.5 MG) BY MOUTH TWICE DAILY WITH A MEAL 180 tablet 3   furosemide (LASIX) 40 MG tablet Take 1 tablet (40 mg total) daily as needed for swelling and/or weight gain. 30 tablet 3   rosuvastatin (CRESTOR) 10 MG tablet Take 1 tablet (10 mg total) by mouth every evening. 90 tablet 3   sacubitril-valsartan (ENTRESTO) 97-103 MG Take 1 tablet by mouth 2 (two) times daily. 180 tablet 3   No current facility-administered medications for this encounter.   Allergies  Allergen Reactions   Spironolactone Other (See Comments)    Breast pain--swelling/inflammation    Social History   Socioeconomic History   Marital status: Married    Spouse name: Not on file   Number of children: Not on file   Years of education: Not on file   Highest education level: Not on file  Occupational History   Not on file  Tobacco Use   Smoking status: Every Day    Packs/day: 1.00    Years: 25.00    Total pack years: 25.00  Types: Cigarettes   Smokeless tobacco: Former    Types: Nurse, children's Use: Never used  Substance and Sexual Activity   Alcohol use: Yes    Alcohol/week: 14.0 standard drinks of alcohol    Types: 14 Glasses of wine per week    Comment: 10/29/2016 "couple glasses of red wine/day"   Drug use: No   Sexual activity: Not Currently  Other Topics Concern   Not on file  Social History Narrative   Lives in [place] with spouse   Social Determinants of Health   Financial Resource Strain: Not on file  Food Insecurity: Not on file  Transportation  Needs: Not on file  Physical Activity: Not on file  Stress: Not on file  Social Connections: Not on file  Intimate Partner Violence: Not on file    Family History  Problem Relation Age of Onset   Osteoporosis Mother    Lung cancer Father    Other Brother        died in [place]   Vitals:   2022-04-10 0955  BP: (!) 150/80  Pulse: 69  SpO2: 99%  Weight: 59.6 kg (131 lb 6.4 oz)     Wt Readings from Last 3 Encounters:  04/10/2022 59.6 kg (131 lb 6.4 oz)  12/17/21 58.6 kg (129 lb 3.2 oz)  06/12/21 58.6 kg (129 lb 3.2 oz)    PHYSICAL EXAM: General:  Elderly Thin. No resp difficulty General:  Well appearing. No resp difficulty HEENT: normal Neck: supple. no JVD. Carotids 2+ bilat; no bruits. No lymphadenopathy or thryomegaly appreciated. Cor: PMI nondisplaced. Regular rate & rhythm. No rubs, gallops or murmurs. Lungs: clear Abdomen: soft, nontender, nondistended. No hepatosplenomegaly. No bruits or masses. Good bowel sounds. Extremities: no cyanosis, clubbing, rash, edema Neuro: alert & orientedx3, cranial nerves grossly intact. moves all 4 extremities w/o difficulty. Affect pleasant   ASSESSMENT & PLAN:  1. CAD with ICM s/p Cardiac Arrest VT/VF:  - Cath 08/13/16 with 3V disease (mostly non-obstructive despite ECG findings)  medical therapy. If develops angina can consider PCI to Ramus.  - s/p SJ ICD - Doing well. No s/s angina  - Unable to tolerate Jardiance - Continue statin - Off ASA with Eliquis  2. Chronic systolic HF.  - Echo [id number] - LVEF 35-40%, grade 1 DD. + apical thrombus.  - Echo 12/2016 EF 40-45% No LV thrombus - Echo 2/21 EF 35-40% - Echo (06/12/21)  EF 40-45% RV ok  - s/p SJ ICD. Interrogation as above  - Stable NYHA II - Volume status looks good - Continue entresto 97/103 mg BID.  - Continue carvedilol 12.5 mg BID.  - Off eplerenone due to hyperkalemia.  (did not tolerate spiro due to painful gynecomastia) - Did not tolerate Jardiance due to  falls/hypovolemia   3. Perforated stomach s/p repair. - happened in setting of CPR s/p repair.  - Doing well.   4. 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  - Continue carvedilol to 12.5 mg BID.   5. LV Thrombus:  - Resolved  6. ETOH and tobacco abuse.  - No longer drinking beer. Drinks a few glasses of wine nightly. Not wanting to cut back - still smoking -> encouraged cessation - will get screening CXR  7. PAF - In NSR  - Continue Eliquis 5 mg BID. No bleeding  8. HTN - Blood pressure elevated here but ok otherwise. Continue current regimen.   Glori Bickers, MD 10-Apr-2022

## 2022-03-31 ENCOUNTER — Telehealth (HOSPITAL_COMMUNITY): Payer: Self-pay | Admitting: Cardiology

## 2022-03-31 DIAGNOSIS — I5022 Chronic systolic (congestive) heart failure: Secondary | ICD-10-CM

## 2022-03-31 NOTE — Telephone Encounter (Signed)
-----   Message from Jolaine Artist, MD sent at 03/26/2022  9:24 AM EDT ----- K up a little. Repeat BMET next week please

## 2022-03-31 NOTE — Telephone Encounter (Signed)
Patient called.  Patient aware.  

## 2022-04-07 ENCOUNTER — Ambulatory Visit: Payer: PPO | Attending: Cardiology

## 2022-04-07 DIAGNOSIS — Z95810 Presence of automatic (implantable) cardiac defibrillator: Secondary | ICD-10-CM | POA: Diagnosis not present

## 2022-04-07 DIAGNOSIS — I5022 Chronic systolic (congestive) heart failure: Secondary | ICD-10-CM

## 2022-04-09 NOTE — Progress Notes (Signed)
EPIC Encounter for ICM Monitoring  Patient Name: [name] [name] is a 72 y.o. male Date: 04/09/2022 Primary Care Physican: [place] [place] [facility] Primary Cardiologist: [place] Electrophysiologist: Curt Bears 11/12/2021 Weight: 130 lbs 03/21/2022 Office Weight: 131 lbs          Spoke with patient and heart failure questions reviewed.  Transmission results reviewed.  Pt asymptomatic for fluid accumulation.  Reports feeling well at this time and voices no complaints.     CorVue Thoracic impedance suggesting normal fluid levels.   Prescribed: Furosemide 40 mg take 1 tablet PRN for swelling or weight gain. Potassium   20 mEq take 1 tablet PRN when taking Furosemide.   Labs: 03/21/2022 Creatinine 0.92, BUN 6, Potassium 5.4, Sodium 132, GFR >60 12/17/2021 Creatinine 0.87, BUN 7, Potassium 4.9, Sodium 132, GFR 91 06/12/2021 Creatinine 0.90, BUN 7, Potassium 4.8, Sodium 132, GFR >60 A complete set of results can be found in Results Review.   Recommendations:  No changes and encouraged to call if experiencing any fluid symptoms.   Follow-up plan: ICM clinic phone appointment on 05/12/2022.  91 day device clinic remote transmission 05/08/2022.      EP/Cardiology Office Visits:  09/17/2022 with Dr Haroldine Laws.  Recall 12/13/2022 with Dr Lovena Le.    Copy of ICM check sent to Dr. Curt Bears.   3 month ICM trend: 04/07/2022.    12-14 Month ICM trend:     Rosalene Billings, RN 04/09/2022 3:15 PM

## 2022-04-10 ENCOUNTER — Ambulatory Visit (HOSPITAL_COMMUNITY)
Admission: RE | Admit: 2022-04-10 | Discharge: 2022-04-10 | Disposition: A | Discharge status: Home / Self Care | Payer: PPO | Source: Ambulatory Visit | Attending: Internal Medicine | Admitting: Internal Medicine

## 2022-04-10 ENCOUNTER — Encounter: Payer: Self-pay | Admitting: Internal Medicine

## 2022-04-10 DIAGNOSIS — I5022 Chronic systolic (congestive) heart failure: Secondary | ICD-10-CM | POA: Insufficient documentation

## 2022-04-10 LAB — BASIC METABOLIC PANEL
Anion gap: 11 (ref 5–15)
BUN: 9 mg/dL (ref 8–23)
CO2: 24 mmol/L (ref 22–32)
Calcium: 9.2 mg/dL (ref 8.9–10.3)
Chloride: 96 mmol/L — ABNORMAL LOW (ref 98–111)
Creatinine, Ser: 1.05 mg/dL (ref 0.61–1.24)
GFR, Estimated: >60 mL/min (ref >60)
Glucose, Bld: 98 mg/dL (ref 70–99)
Potassium: 4.1 mmol/L (ref 3.5–5.1)
Sodium: 131 mmol/L — ABNORMAL LOW (ref 135–145)

## 2022-05-08 ENCOUNTER — Ambulatory Visit (INDEPENDENT_AMBULATORY_CARE_PROVIDER_SITE_OTHER): Payer: PPO

## 2022-05-08 DIAGNOSIS — I255 Ischemic cardiomyopathy: Secondary | ICD-10-CM

## 2022-05-08 LAB — CUP PACEART REMOTE DEVICE CHECK
Battery Remaining Longevity: 56 mo
Battery Remaining Percentage: 54 %
Battery Voltage: 2.96 V
Brady Statistic RV Percent Paced: 1 %
Date Time Interrogation Session: 20240425020018
HighPow Impedance: 63 Ohm
HighPow Impedance: 63 Ohm
Implantable Lead Connection Status: 753985
Implantable Lead Implant Date: 20181017
Implantable Lead Location: 753860
Implantable Pulse Generator Implant Date: 20181017
Lead Channel Impedance Value: 480 Ohm
Lead Channel Pacing Threshold Amplitude: 0.5 V
Lead Channel Pacing Threshold Pulse Width: 0.5 ms
Lead Channel Sensing Intrinsic Amplitude: 12 mV
Lead Channel Setting Pacing Amplitude: 2.5 V
Lead Channel Setting Pacing Pulse Width: 0.5 ms
Lead Channel Setting Sensing Sensitivity: 0.5 mV
Pulse Gen Serial Number: 9781512

## 2022-05-12 ENCOUNTER — Ambulatory Visit: Payer: PPO | Attending: Cardiology

## 2022-05-12 DIAGNOSIS — Z95810 Presence of automatic (implantable) cardiac defibrillator: Secondary | ICD-10-CM | POA: Diagnosis not present

## 2022-05-12 DIAGNOSIS — I5022 Chronic systolic (congestive) heart failure: Secondary | ICD-10-CM

## 2022-05-14 ENCOUNTER — Telehealth: Payer: Self-pay

## 2022-05-14 NOTE — Telephone Encounter (Signed)
Remote ICM transmission received.  Attempted call to patient regarding ICM remote transmission and left message to return call   

## 2022-05-14 NOTE — Progress Notes (Signed)
EPIC Encounter for ICM Monitoring  Patient Name: [name] [name] is a 72 y.o. male Date: 05/14/2022 Primary Care Physican: [facility] Cardiologist: B[facility] Electrophysiologist: Elberta Fortis 11/12/2021 Weight: 130 lbs 03/21/2022 Office Weight: 131 lbs          Attempted call to patient and unable to reach.  Left message to return call. Transmission reviewed.     CorVue Thoracic impedance suggesting normal fluid levels with exception of possible fluid accumulation from 4/9-4/20.   Prescribed: Furosemide 40 mg take 1 tablet PRN for swelling or weight gain. Potassium   20 mEq take 1 tablet PRN when taking Furosemide.   Labs: 03/21/2022 Creatinine 0.92, BUN 6, Potassium 5.4, Sodium 132, GFR >60 12/17/2021 Creatinine 0.87, BUN 7, Potassium 4.9, Sodium 132, GFR 91 06/12/2021 Creatinine 0.90, BUN 7, Potassium 4.8, Sodium 132, GFR >60 A complete set of results can be found in Results Review.   Recommendations:  Unable to reach.     Follow-up plan: ICM clinic phone appointment on 06/16/2022.  91 day device clinic remote transmission 08/07/2022.      EP/Cardiology Office Visits:  09/17/2022 with Dr Gala Romney.  Recall 12/13/2022 with Dr Ladona Ridgel.    Copy of ICM check sent to Dr. Elberta Fortis.    3 month ICM trend: 05/12/2022.    12-14 Month ICM trend:     Karie Soda, RN 05/14/2022 7:38 AM

## 2022-06-03 NOTE — Progress Notes (Signed)
Remote ICD transmission.   

## 2022-06-16 ENCOUNTER — Ambulatory Visit: Payer: PPO | Attending: Cardiology

## 2022-06-16 DIAGNOSIS — I5022 Chronic systolic (congestive) heart failure: Secondary | ICD-10-CM

## 2022-06-16 DIAGNOSIS — Z95810 Presence of automatic (implantable) cardiac defibrillator: Secondary | ICD-10-CM | POA: Diagnosis not present

## 2022-06-18 ENCOUNTER — Telehealth: Payer: Self-pay

## 2022-06-18 NOTE — Progress Notes (Signed)
EPIC Encounter for ICM Monitoring  Patient Name: [name] [name] is a 72 y.o. male Date: 06/18/2022 Primary Care Physican: [facility] Primary Cardiologist: Bensimhon Electrophysiologist: Elberta Fortis 11/12/2021 Weight: 130 lbs 03/21/2022 Office Weight: 131 lbs          Attempted call to patient and unable to reach.  Left message to return call. Transmission reviewed.     CorVue Thoracic impedance suggesting normal fluid levels.   Prescribed: Furosemide 40 mg take 1 tablet PRN for swelling or weight gain. Potassium   20 mEq take 1 tablet PRN when taking Furosemide.   Labs: 04/10/2022 Creatinine 1.05, BUN 9, Potassium 4.1, Sodium 131, GFR >60 03/21/2022 Creatinine 0.92, BUN 6, Potassium 5.4, Sodium 132, GFR >60 A complete set of results can be found in Results Review.   Recommendations:  Unable to reach.     Follow-up plan: ICM clinic phone appointment on 07/21/2022.  91 day device clinic remote transmission 08/07/2022.      EP/Cardiology Office Visits:  09/17/2022 with Dr Gala Romney.  Recall 12/13/2022 with Dr Ladona Ridgel.    Copy of ICM check sent to Dr. Elberta Fortis.     3 month ICM trend: 06/16/2022.    12-14 Month ICM trend:     Karie Soda, RN 06/18/2022 2:09 PM

## 2022-06-18 NOTE — Telephone Encounter (Signed)
Remote ICM transmission received.  Attempted call to patient regarding ICM remote transmission and left detailed message per DPR.  Left ICM phone number and advised to return call for any fluid symptoms or questions. Next ICM remote transmission scheduled 07/21/2022.    

## 2022-07-07 ENCOUNTER — Other Ambulatory Visit: Payer: Self-pay

## 2022-07-07 DIAGNOSIS — I5022 Chronic systolic (congestive) heart failure: Secondary | ICD-10-CM

## 2022-07-07 DIAGNOSIS — I2510 Atherosclerotic heart disease of native coronary artery without angina pectoris: Secondary | ICD-10-CM

## 2022-07-07 DIAGNOSIS — I255 Ischemic cardiomyopathy: Secondary | ICD-10-CM

## 2022-07-07 MED ORDER — ROSUVASTATIN CALCIUM 10 MG PO TABS: 10.0000 mg | ORAL_TABLET | Freq: Every evening | ORAL | 1 refills | Status: DC

## 2022-07-21 ENCOUNTER — Ambulatory Visit: Payer: PPO | Attending: Cardiology

## 2022-07-21 DIAGNOSIS — Z95810 Presence of automatic (implantable) cardiac defibrillator: Secondary | ICD-10-CM | POA: Diagnosis not present

## 2022-07-21 DIAGNOSIS — I5022 Chronic systolic (congestive) heart failure: Secondary | ICD-10-CM

## 2022-07-25 NOTE — Progress Notes (Signed)
EPIC Encounter for ICM Monitoring  Patient Name: [name] [name] is a 72 y.o. male Date: 07/25/2022 Primary Care Physican: [facility] Cardiologist: Bensimhon Electrophysiologist: Elberta Fortis 03/21/2022 Office Weight: 131 lbs 07/25/2022 Weight: 131 lbs          Spoke with patient and heart failure questions reviewed.  Transmission results reviewed.  Pt asymptomatic for fluid accumulation.  Reports feeling well at this time and voices no complaints.     CorVue Thoracic impedance suggesting normal fluid levels with exception of possible fluid accumulation 6/19-7/6.   Prescribed: Furosemide 40 mg take 1 tablet PRN for swelling or weight gain. Potassium   20 mEq take 1 tablet PRN when taking Furosemide.   Labs: 04/10/2022 Creatinine 1.05, BUN 9, Potassium 4.1, Sodium 131, GFR >60 03/21/2022 Creatinine 0.92, BUN 6, Potassium 5.4, Sodium 132, GFR >60 A complete set of results can be found in Results Review.   Recommendations:  No changes and encouraged to call if experiencing any fluid symptoms.   Follow-up plan: ICM clinic phone appointment on 08/25/2022.  91 day device clinic remote transmission 08/07/2022.      EP/Cardiology Office Visits:  09/23/2022 with Dr Gala Romney.  Recall 12/13/2022 with Dr Ladona Ridgel.    Copy of ICM check sent to Dr. Elberta Fortis.    3 month ICM trend: 07/21/2022.    12-14 Month ICM trend:     Karie Soda, RN 07/25/2022 12:10 PM

## 2022-08-07 ENCOUNTER — Ambulatory Visit: Payer: PPO

## 2022-08-07 DIAGNOSIS — I255 Ischemic cardiomyopathy: Secondary | ICD-10-CM | POA: Diagnosis not present

## 2022-08-08 LAB — CUP PACEART REMOTE DEVICE CHECK: Date Time Interrogation Session: 20240725020016

## 2022-08-19 NOTE — Progress Notes (Signed)
Remote ICD transmission.   

## 2022-08-25 ENCOUNTER — Ambulatory Visit: Payer: PPO

## 2022-08-25 DIAGNOSIS — I5022 Chronic systolic (congestive) heart failure: Secondary | ICD-10-CM

## 2022-08-25 DIAGNOSIS — Z95810 Presence of automatic (implantable) cardiac defibrillator: Secondary | ICD-10-CM

## 2022-08-27 NOTE — Progress Notes (Signed)
EPIC Encounter for ICM Monitoring  Patient Name: [name] [name] is a 72 y.o. male Date: 08/27/2022 Primary Care Physican: [facility] Cardiologist: Bensimhon Electrophysiologist: Elberta Fortis 03/21/2022 Office Weight: 131 lbs 07/25/2022 Weight: 131 lbs          Spoke with patient and heart failure questions reviewed.  Transmission results reviewed.  Pt asymptomatic for fluid accumulation.  Reports feeling well at this time and voices no complaints.     CorVue Thoracic impedance suggesting normal fluid levels with exception of possible fluid accumulation 7/31-8/2.   Prescribed: Furosemide 40 mg take 1 tablet PRN for swelling or weight gain. Potassium   20 mEq take 1 tablet PRN when taking Furosemide.   Labs: 04/10/2022 Creatinine 1.05, BUN 9, Potassium 4.1, Sodium 131, GFR >60 03/21/2022 Creatinine 0.92, BUN 6, Potassium 5.4, Sodium 132, GFR >60 A complete set of results can be found in Results Review.   Recommendations:  No changes and encouraged to call if experiencing any fluid symptoms.   Follow-up plan: ICM clinic phone appointment on 09/29/2022.  91 day device clinic remote transmission 11/06/2022.      EP/Cardiology Office Visits:  09/23/2022 with HF clinic.  12/16/2022 with Dr Elberta Fortis.    Copy of ICM check sent to Dr. Elberta Fortis.      3 month ICM trend: 08/25/2022.    12-14 Month ICM trend:     Karie Soda, RN 08/27/2022 2:52 PM

## 2022-09-12 ENCOUNTER — Other Ambulatory Visit: Payer: Self-pay | Admitting: Internal Medicine

## 2022-09-19 ENCOUNTER — Other Ambulatory Visit (HOSPITAL_COMMUNITY): Payer: Self-pay | Admitting: *Deleted

## 2022-09-19 DIAGNOSIS — I5022 Chronic systolic (congestive) heart failure: Secondary | ICD-10-CM

## 2022-09-22 NOTE — Progress Notes (Signed)
[facility] Note   Primary Cardiologist: Dr. Gala Romney  EP: Dr. Johney Frame  HPI  [name] [name] is a 72 y.o. male with history of cardiac arrest, systolic HF, CAD, HTN, HLD, tobacco abuse.  Had complicated admission 07/2016 due to out of hospital cardiac arrest with CPR/defib -> intestinal perforation, rib fractures, large chest wall hematoma and T8 vertebral fracture. Underwent multiple procedures including exploratory laparotomy and repair of perforated stomach with an omental patch by General Surgery and evacuation of chest hematoma by TCTS. Cath as below with 3v disease though mostly non-obstructive, medical management pursued.  EF 35-40% by echo with LV thrombus,. Lifevest placed prior to discharge.  S/p SJ ICD   He presents today for follow up. Stopped Jardiance as it made him feel bad. He feels good. Walking his dog frequently. No CP, SOB, edema, orthopnea or PND. Compliant with meds. Still smoking. "I'm down to smoking just barely over a pack per day."   Echo (06/12/21)  EF 40-45% RV ok    ICD interrogation: No VT/AF volume ok Personally reviewed   Echo 02/26/18  35-40% with antero-septal AK  Echo 12/2016 40-45% with antero-septal AK  Echo 08/06/16 - LVEF 35-40%, grade 1 DD. Apparent medium-sized apical thrombus.   Left/Right heart cath 08/13/16 Ost RCA lesion (nondominant), 80 %stenosed. Ost LM lesion, 40 %stenosed. Ost Ramus to Ramus lesion, 80 %stenosed. Ost LAD to Prox LAD lesion, 70 %stenosed. Ost 1st Diag to 1st Diag lesion, 75 %stenosed. Mid LAD to Dist LAD lesion, 40 %stenosed.    Review of systems complete and found to be negative unless listed in HPI.    Past Medical History:  Diagnosis Date   AICD (automatic cardioverter/defibrillator) present    CAD (coronary artery disease)    Coronary artery disease    1980's-90's MI with perhaps angioplasty   Hypertension    Ischemic cardiomyopathy    LV (left ventricular) mural thrombus following MI  [facility])    Myocardial infarction [facility]) 1999   Ventricular fibrillation ([facility]) 07/2016       Current Outpatient Medications  Medication Sig Dispense Refill   acetaminophen (TYLENOL) 325 MG tablet Take 2 tablets (650 mg total) by mouth every 4 (four) hours as needed for mild pain or headache.     apixaban (ELIQUIS) 5 MG TABS tablet TAKE 1 TABLET(5 MG) BY MOUTH TWICE DAILY 180 tablet 3   carvedilol (COREG) 12.5 MG tablet TAKE 1 TABLET(12.5 MG) BY MOUTH TWICE DAILY WITH A MEAL 180 tablet 3   furosemide (LASIX) 40 MG tablet Take 1 tablet (40 mg total) daily as needed for swelling and/or weight gain. 30 tablet 3   rosuvastatin (CRESTOR) 10 MG tablet Take 1 tablet (10 mg total) by mouth every evening. 90 tablet 1   sacubitril-valsartan (ENTRESTO) 97-103 MG Take 1 tablet by mouth 2 (two) times daily. 180 tablet 3   No current facility-administered medications for this visit.   Allergies  Allergen Reactions   Spironolactone Other (See Comments)    Breast pain--swelling/inflammation    Social History   Socioeconomic History   Marital status: Married    Spouse name: Not on file   Number of children: Not on file   Years of education: Not on file   Highest education level: Not on file  Occupational History   Not on file  Tobacco Use   Smoking status: Every Day    Current packs/day: 1.00    Average packs/day: 1 pack/day for 25.0 years (25.0 ttl pk-yrs)  Types: Cigarettes   Smokeless tobacco: Former    Types: Engineer, drilling   Vaping status: Never Used  Substance and Sexual Activity   Alcohol use: Yes    Alcohol/week: 14.0 standard drinks of alcohol    Types: 14 Glasses of wine per week    Comment: 10/29/2016 "couple glasses of red wine/day"   Drug use: No   Sexual activity: Not Currently  Other Topics Concern   Not on file  Social History Narrative   Lives in [place] with spouse   Social Determinants of Health   Financial Resource Strain: Not on file  Food Insecurity: Not  on file  Transportation Needs: Not on file  Physical Activity: Not on file  Stress: Not on file  Social Connections: Not on file  Intimate Partner Violence: Not on file    Family History  Problem Relation Age of Onset   Osteoporosis Mother    Lung cancer Father    Other Brother        died in [place]   There were no vitals filed for this visit.    Wt Readings from Last 3 Encounters:  03/21/22 59.6 kg (131 lb 6.4 oz)  12/17/21 58.6 kg (129 lb 3.2 oz)  06/12/21 58.6 kg (129 lb 3.2 oz)    PHYSICAL EXAM: General:  Elderly Thin. No resp difficulty General:  Well appearing. No resp difficulty HEENT: normal Neck: supple. no JVD. Carotids 2+ bilat; no bruits. No lymphadenopathy or thryomegaly appreciated. Cor: PMI nondisplaced. Regular rate & rhythm. No rubs, gallops or murmurs. Lungs: clear Abdomen: soft, nontender, nondistended. No hepatosplenomegaly. No bruits or masses. Good bowel sounds. Extremities: no cyanosis, clubbing, rash, edema Neuro: alert & orientedx3, cranial nerves grossly intact. moves all 4 extremities w/o difficulty. Affect pleasant   ASSESSMENT & PLAN:  1. CAD with ICM s/p Cardiac Arrest VT/VF:  - Cath 08/13/16 with 3V disease (mostly non-obstructive despite ECG findings)  medical therapy. If develops angina can consider PCI to Ramus.  - s/p SJ ICD - Doing well. No s/s angina  - Unable to tolerate Jardiance - Continue statin - Off ASA with Eliquis  2. Chronic systolic HF.  - Echo 08/06/16 - LVEF 35-40%, grade 1 DD. + apical thrombus.  - Echo 12/2016 EF 40-45% No LV thrombus - Echo 2/21 EF 35-40% - Echo (06/12/21)  EF 40-45% RV ok  - s/p SJ ICD. Interrogation as above  - Stable NYHA II - Volume status looks good - Continue entresto 97/103 mg BID.  - Continue carvedilol 12.5 mg BID.  - Off eplerenone due to hyperkalemia.  (did not tolerate spiro due to painful gynecomastia) - Did not tolerate Jardiance due to falls/hypovolemia   3. Perforated stomach  s/p repair. - happened in setting of CPR s/p repair.  - Doing well.   4. 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  - Continue carvedilol to 12.5 mg BID.   5. LV Thrombus:  - Resolved  6. ETOH and tobacco abuse.  - No longer drinking beer. Drinks a few glasses of wine nightly. Not wanting to cut back - still smoking -> encouraged cessation - will get screening CXR  7. PAF - In NSR  - Continue Eliquis 5 mg BID. No bleeding  8. HTN - Blood pressure elevated here but ok otherwise. Continue current regimen.   Anderson Malta Sprague, FNP 09/22/22

## 2022-09-23 ENCOUNTER — Ambulatory Visit (HOSPITAL_COMMUNITY)
Admission: RE | Admit: 2022-09-23 | Discharge: 2022-09-23 | Disposition: A | Discharge status: Home / Self Care | Payer: PPO | Source: Ambulatory Visit | Attending: Internal Medicine | Admitting: Internal Medicine

## 2022-09-23 ENCOUNTER — Encounter (HOSPITAL_COMMUNITY): Payer: Self-pay

## 2022-09-23 ENCOUNTER — Ambulatory Visit (HOSPITAL_BASED_OUTPATIENT_CLINIC_OR_DEPARTMENT_OTHER)
Admission: RE | Admit: 2022-09-23 | Discharge: 2022-09-23 | Disposition: A | Discharge status: Home / Self Care | Payer: PPO | Source: Ambulatory Visit | Attending: Internal Medicine | Admitting: Internal Medicine

## 2022-09-23 VITALS — BP 138/88 | HR 59 | Wt 125.4 lb

## 2022-09-23 DIAGNOSIS — Z7901 Long term (current) use of anticoagulants: Secondary | ICD-10-CM | POA: Insufficient documentation

## 2022-09-23 DIAGNOSIS — I255 Ischemic cardiomyopathy: Secondary | ICD-10-CM | POA: Insufficient documentation

## 2022-09-23 DIAGNOSIS — R9431 Abnormal electrocardiogram [ECG] [EKG]: Secondary | ICD-10-CM | POA: Insufficient documentation

## 2022-09-23 DIAGNOSIS — R0683 Snoring: Secondary | ICD-10-CM | POA: Insufficient documentation

## 2022-09-23 DIAGNOSIS — I493 Ventricular premature depolarization: Secondary | ICD-10-CM

## 2022-09-23 DIAGNOSIS — Z8674 Personal history of sudden cardiac arrest: Secondary | ICD-10-CM | POA: Diagnosis not present

## 2022-09-23 DIAGNOSIS — I2510 Atherosclerotic heart disease of native coronary artery without angina pectoris: Secondary | ICD-10-CM | POA: Diagnosis not present

## 2022-09-23 DIAGNOSIS — F1010 Alcohol abuse, uncomplicated: Secondary | ICD-10-CM

## 2022-09-23 DIAGNOSIS — I110 Hypertensive heart disease with heart failure: Secondary | ICD-10-CM | POA: Insufficient documentation

## 2022-09-23 DIAGNOSIS — Z720 Tobacco use: Secondary | ICD-10-CM

## 2022-09-23 DIAGNOSIS — I480 Paroxysmal atrial fibrillation: Secondary | ICD-10-CM

## 2022-09-23 DIAGNOSIS — R5383 Other fatigue: Secondary | ICD-10-CM | POA: Insufficient documentation

## 2022-09-23 DIAGNOSIS — F17210 Nicotine dependence, cigarettes, uncomplicated: Secondary | ICD-10-CM | POA: Diagnosis not present

## 2022-09-23 DIAGNOSIS — I513 Intracardiac thrombosis, not elsewhere classified: Secondary | ICD-10-CM

## 2022-09-23 DIAGNOSIS — I5022 Chronic systolic (congestive) heart failure: Secondary | ICD-10-CM

## 2022-09-23 DIAGNOSIS — I10 Essential (primary) hypertension: Secondary | ICD-10-CM

## 2022-09-23 DIAGNOSIS — K255 Chronic or unspecified gastric ulcer with perforation: Secondary | ICD-10-CM

## 2022-09-23 DIAGNOSIS — Z95810 Presence of automatic (implantable) cardiac defibrillator: Secondary | ICD-10-CM | POA: Diagnosis not present

## 2022-09-23 DIAGNOSIS — I5020 Unspecified systolic (congestive) heart failure: Secondary | ICD-10-CM | POA: Diagnosis present

## 2022-09-23 LAB — CBC
HCT: 42.8 % (ref 39.0–52.0)
Hemoglobin: 14.9 g/dL (ref 13.0–17.0)
MCH: 37.1 pg — ABNORMAL HIGH (ref 26.0–34.0)
MCHC: 34.8 g/dL (ref 30.0–36.0)
MCV: 106.5 fL — ABNORMAL HIGH (ref 80.0–100.0)
Platelets: 203 10*3/uL (ref 150–400)
RBC: 4.02 MIL/uL — ABNORMAL LOW (ref 4.22–5.81)
RDW: 12.6 % (ref 11.5–15.5)
WBC: 6.7 10*3/uL (ref 4.0–10.5)
nRBC: 0 % (ref 0.0–0.2)

## 2022-09-23 LAB — BASIC METABOLIC PANEL
Anion gap: 11 (ref 5–15)
BUN: 8 mg/dL (ref 8–23)
CO2: 25 mmol/L (ref 22–32)
Calcium: 9.4 mg/dL (ref 8.9–10.3)
Chloride: 98 mmol/L (ref 98–111)
Creatinine, Ser: 0.94 mg/dL (ref 0.61–1.24)
GFR, Estimated: >60 mL/min (ref >60)
Glucose, Bld: 112 mg/dL — ABNORMAL HIGH (ref 70–99)
Potassium: 5.1 mmol/L (ref 3.5–5.1)
Sodium: 134 mmol/L — ABNORMAL LOW (ref 135–145)

## 2022-09-23 LAB — ECHOCARDIOGRAM COMPLETE
Area-P 1/2: 3 cm2
S' Lateral: 2.7 cm

## 2022-09-23 LAB — FERRITIN: Ferritin: 311 ng/mL (ref 24–336)

## 2022-09-23 LAB — IRON AND TIBC
Iron: 147 ug/dL (ref 45–182)
Saturation Ratios: 42 % — ABNORMAL HIGH (ref 17.9–39.5)
TIBC: 351 ug/dL (ref 250–450)
UIBC: 204 ug/dL

## 2022-09-23 LAB — TSH: TSH: 1.607 u[IU]/mL (ref 0.350–4.500)

## 2022-09-23 MED ORDER — PERFLUTREN LIPID MICROSPHERE
1.0000 mL | INTRAVENOUS | Status: DC | PRN
  Administered 2022-09-23: 10 mL via INTRAVENOUS

## 2022-09-23 NOTE — Patient Instructions (Addendum)
Medication Changes:  No Changes In Medications at this time.   Lab Work:  Labs done today, your results will be available in MyChart, we will contact you for abnormal readings.  Testing/Procedures:  Your provider has recommended that you have a home sleep study (Itamar Test).  We have provided you with the equipment in our office today. Please go ahead and download the app. DO NOT OPEN OR TAMPER WITH THE BOX UNTIL WE ADVISE YOU TO DO SO. Once insurance has approved the test our office will call you with PIN number and approval to proceed with testing. Once you have completed the test you just dispose of the equipment, the information is automatically uploaded to [place] via blue-tooth technology. If your test is positive for sleep apnea and you need a home CPAP machine you will be contacted by Dr Norris Cross office [facility]) to set this up.  Your physician has recommended that you have a cardiopulmonary stress test (CPX). CPX testing is a non-invasive measurement of heart and lung function. It replaces a traditional treadmill stress test. This type of test provides a tremendous amount of information that relates not only to your present condition but also for future outcomes. This test combines measurements of you ventilation, respiratory gas exchange in the lungs, electrocardiogram (EKG), blood pressure and physical response before, during, and following an exercise protocol.  Expect to be in the lab for 2 hours. Please plan to arrive 30 minutes prior to your appointment. You may be asked to reschedule your test if you arrive 20 minutes or more after your scheduled appointment time.  Main Campus address: [address] [place], [place] [address] You may arrive to the Main Entrance A or Entrance C (free valet parking is available at both). -Main Entrance A (on [address]) :proceed to admitting for check in -Entrance C (on [facility]): proceed to [employer] parking or under hospital deck parking using  this code _________  Check In: [facility] waiting room (1st floor)   General Instructions for the day of the test (Please follow all instructions from your physician): Refrain from ingesting a heavy meal, alcohol, or caffeine or using tobacco products within 2 hours of the test (DO NOT FAST for mare than 8 hours). You may have all other non-alcoholic, non -caffeinated beverage,a light snack (crackers,a piece of fruit, carrot sticks, toast bagel,etc) up to your appointment. Avoid significant exertion or exercise within 24 hours of your test. Be prepared to exercise and sweat. Your clothing should permit freedom of movement and include walking or running shoes. Women bring loose fitting short sleeved blouse.  This evaluation may be fatiguing and you may wish ti have someone accompany you to the assessment to drive you home afterward. Bring a list of your medications with you, including dosage and frequency you take the medications (  I.e.,once per day, twice per day, etc). Take all medications as prescribed, unless noted below or instructed to do so by your physician.  Please do not take the following medications prior to your CPX:  _________________________________________________  _________________________________________________  Brief description of the test: A brief lung test will be performed. This will involve you taking deep breaths and blowing hard and fast through your mouth. During these , a clip will be on your nose and you will be breathing through a breathing device.   For the exercise portion of the test you will be walking on a treadmill, or riding a stationary bike, to your maximal  effor or until symptoms such as chest pain, shortness of breath, leg pain or dizziness limit your exercise. You will be breathing in and out of a breathing device through your mouth (a clip will be on your nose again). Your heart rate, ECG, blood pressure, oxygen saturations, breathing rate  and depth, amount of oxygen you consume and amount of carbon dioxide you produce will be measured and monitored throughout the exercise test.  If you need to cancel or reschedule your appointment please call [phone] If you have further questions please call your physician or Philip Aspen, MS, ACSM-RCEP at [phone]   Special Instructions // Education:  CHECK YOUR BLOOD PRESSURE DAILY- WRITE THIS NUMBER DOWN AND KEEP A BLOOD PRESSURE LOG   Follow-Up in: 6 MONTHS WITH DR. Gala Romney PLEASE CALL OUR OFFICE AROUND JANUARY 2025 TO GET SCHEDULED FOR YOUR APPOINTMENT. PHONE NUMBER IS [phone] OPTION 2    At the [facility], you and your health needs are our priority. We have a designated team specialized in the treatment of Heart Failure. This Care Team includes your primary Heart Failure Specialized Cardiologist (physician), Advanced Practice Providers (APPs- Physician Assistants and Nurse Practitioners), and Pharmacist who all work together to provide you with the care you need, when you need it.   You may see any of the following providers on your designated Care Team at your next follow up:  Dr. Arvilla Meres Dr. Marca Ancona Dr. Marcos Eke, NP Robbie Lis, [place] [facility], [place] Brynda Peon, NP Karle Plumber, PharmD   Please be sure to bring in all your medications bottles to every appointment.   Need to Contact us:  If you have any questions or concerns before your next appointment please send [place] a message through [place] or call our office at [phone].    TO LEAVE A MESSAGE FOR THE NURSE SELECT OPTION 2, PLEASE LEAVE A MESSAGE INCLUDING: YOUR NAME DATE OF BIRTH CALL BACK NUMBER REASON FOR CALL**this is important as we prioritize the call backs  YOU WILL RECEIVE A CALL BACK THE SAME DAY AS LONG AS YOU CALL BEFORE 4:00 PM

## 2022-09-23 NOTE — Progress Notes (Addendum)
ITAMAR home sleep study given to patient, all instructions explained, waiver signed and CLOUDPAT registration complete.

## 2022-09-23 NOTE — Progress Notes (Signed)
Height:     Weight: BMI:  Today's Date:  STOP BANG RISK ASSESSMENT S (snore) Have you been told that you snore?     YES   T (tired) Are you often tired, fatigued, or sleepy during the day?   YES  O (obstruction) Do you stop breathing, choke, or gasp during sleep? NO   P (pressure) Do you have or are you being treated for high blood pressure? YES   B (BMI) Is your body index greater than 35 kg/m? NO   A (age) Are you 74 years old or older? YES   N (neck) Do you have a neck circumference greater than 16 inches?   NO   G (gender) Are you a male? YES   TOTAL STOP/BANG "YES" ANSWERS 5                                                                       For Office Use Only              Procedure Order Form    YES to 3+ Stop Bang questions OR two clinical symptoms - patient qualifies for WatchPAT (CPT 95800)      Clinical Notes: Will consult Sleep Specialist and refer for management of therapy due to patient increased risk of Sleep Apnea. Ordering a sleep study due to the following two clinical symptoms: Excessive daytime sleepiness G47.10 / Gastroesophageal reflux K21.9 / Nocturia R35.1 / Morning Headaches G44.221 / Difficulty concentrating R41.840 / Memory problems or poor judgment G31.84 / Personality changes or irritability R45.4 / Loud snoring R06.83 / Depression F32.9 / Unrefreshed by sleep G47.8 / Impotence N52.9 / History of high blood pressure R03.0 / Insomnia G47.00       

## 2022-09-29 ENCOUNTER — Ambulatory Visit: Payer: PPO | Attending: Cardiology

## 2022-09-29 DIAGNOSIS — Z95810 Presence of automatic (implantable) cardiac defibrillator: Secondary | ICD-10-CM

## 2022-09-29 DIAGNOSIS — I5022 Chronic systolic (congestive) heart failure: Secondary | ICD-10-CM

## 2022-10-01 ENCOUNTER — Telehealth (HOSPITAL_COMMUNITY): Payer: Self-pay | Admitting: *Deleted

## 2022-10-01 NOTE — Telephone Encounter (Signed)
Left voicemail to remind patient of CPX appointment on Tuesday 9/24 at 11am. Provided patient with call back [phone] if cancellation/reschedule is necessary.       Reggy Eye, MS, ACSM, [facility] Clinical Exercise Physiologist/ Health and Wellness Coach

## 2022-10-01 NOTE — Progress Notes (Signed)
EPIC Encounter for ICM Monitoring  Patient Name: [name] [name] is a 72 y.o. male Date: 10/01/2022 Primary Care Physican: Patient, No Pcp Per Primary Cardiologist: Bensimhon Electrophysiologist: [facility] 03/21/2022 Office Weight: 131 lbs 07/25/2022 Weight: 131 lbs         Attempted call to patient and unable to reach.  Left detailed message per DPR regarding transmission. Transmission reviewed.      CorVue Thoracic impedance suggesting normal fluid levels within the last month.   Prescribed: Furosemide 40 mg take 1 tablet PRN for swelling or weight gain. Potassium   20 mEq take 1 tablet PRN when taking Furosemide.   Labs: 09/23/2022 Creatinine 0.94, BUN 8, Potassium 5.1, Sodium 134, GFR >60 04/10/2022 Creatinine 1.05, BUN 9, Potassium 4.1, Sodium 131, GFR >60 03/21/2022 Creatinine 0.92, BUN 6, Potassium 5.4, Sodium 132, GFR >60 A complete set of results can be found in Results Review.   Recommendations:  Left voice mail with ICM number and encouraged to call if experiencing any fluid symptoms..   Follow-up plan: ICM clinic phone appointment on 11/03/2022.  91 day device clinic remote transmission 11/06/2022.      EP/Cardiology Office Visits:    12/16/2022 with Dr Elberta Fortis.    Copy of ICM check sent to Dr. Elberta Fortis.    3 month ICM trend: 09/29/2022.    12-14 Month ICM trend:     Karie Soda, RN 10/01/2022 3:21 PM

## 2022-10-02 ENCOUNTER — Encounter: Payer: Self-pay | Admitting: Internal Medicine

## 2022-10-02 ENCOUNTER — Telehealth (HOSPITAL_COMMUNITY): Payer: Self-pay

## 2022-10-02 NOTE — Telephone Encounter (Signed)
Called and left message for patient to complete his Itamar before 10/10/22, if he is unable to complete it by then have asked him to return it to the office, otherwise he will be charged for the device.

## 2022-10-07 ENCOUNTER — Other Ambulatory Visit (HOSPITAL_COMMUNITY): Payer: Self-pay | Admitting: *Deleted

## 2022-10-07 ENCOUNTER — Ambulatory Visit (HOSPITAL_COMMUNITY): Payer: PPO | Attending: Cardiology

## 2022-10-07 DIAGNOSIS — I5022 Chronic systolic (congestive) heart failure: Secondary | ICD-10-CM

## 2022-10-07 DIAGNOSIS — J984 Other disorders of lung: Secondary | ICD-10-CM | POA: Diagnosis not present

## 2022-11-03 ENCOUNTER — Ambulatory Visit: Payer: PPO | Attending: Cardiology

## 2022-11-03 DIAGNOSIS — I5022 Chronic systolic (congestive) heart failure: Secondary | ICD-10-CM | POA: Diagnosis not present

## 2022-11-03 DIAGNOSIS — Z95810 Presence of automatic (implantable) cardiac defibrillator: Secondary | ICD-10-CM

## 2022-11-04 NOTE — Progress Notes (Signed)
EPIC Encounter for ICM Monitoring  Patient Name: [name] [name] is a 72 y.o. male Date: 11/04/2022 Primary Care Physican: Patient, No Pcp Per Primary Cardiologist: Bensimhon Electrophysiologist: [facility] 03/21/2022 Office Weight: 131 lbs 07/25/2022 Weight: 131 lbs 11/04/2022 Weight: 131 lbs          Spoke with patient and heart failure questions reviewed.  Transmission results reviewed.  Pt asymptomatic for fluid accumulation.  Reports feeling well at this time and voices no complaints.     CorVue Thoracic impedance suggesting normal fluid levels with the exception of possible fluid accumulation from 10/2-10/10.   Prescribed: Furosemide 40 mg take 1 tablet PRN for swelling or weight gain. Potassium   20 mEq take 1 tablet PRN when taking Furosemide.   Labs: 09/23/2022 Creatinine 0.94, BUN 8, Potassium 5.1, Sodium 134, GFR >60 04/10/2022 Creatinine 1.05, BUN 9, Potassium 4.1, Sodium 131, GFR >60 03/21/2022 Creatinine 0.92, BUN 6, Potassium 5.4, Sodium 132, GFR >60 A complete set of results can be found in Results Review.   Recommendations:  No changes and encouraged to call if experiencing any fluid symptoms.   Follow-up plan: ICM clinic phone appointment on 12/08/2022.  91 day device clinic remote transmission 02/05/2023.      EP/Cardiology Office Visits:  12/16/2022 with Dr Elberta Fortis.    Copy of ICM check sent to Dr. Elberta Fortis.    3 month ICM trend: 11/03/2022.    12-14 Month ICM trend:     Karie Soda, RN 11/04/2022 10:49 AM

## 2022-12-08 ENCOUNTER — Ambulatory Visit: Payer: PPO | Attending: Cardiology

## 2022-12-08 DIAGNOSIS — I5022 Chronic systolic (congestive) heart failure: Secondary | ICD-10-CM

## 2022-12-08 DIAGNOSIS — Z95810 Presence of automatic (implantable) cardiac defibrillator: Secondary | ICD-10-CM | POA: Diagnosis not present

## 2022-12-09 ENCOUNTER — Other Ambulatory Visit: Payer: Self-pay | Admitting: Internal Medicine

## 2022-12-09 DIAGNOSIS — I255 Ischemic cardiomyopathy: Secondary | ICD-10-CM

## 2022-12-09 DIAGNOSIS — I2510 Atherosclerotic heart disease of native coronary artery without angina pectoris: Secondary | ICD-10-CM

## 2022-12-09 DIAGNOSIS — I5022 Chronic systolic (congestive) heart failure: Secondary | ICD-10-CM

## 2022-12-09 NOTE — Progress Notes (Signed)
EPIC Encounter for ICM Monitoring  Patient Name: [name] [name] is a 72 y.o. male Date: 12/09/2022 Primary Care Physican: Patient, No Pcp Per Primary Cardiologist: Bensimhon Electrophysiologist: [facility] 03/21/2022 Office Weight: 131 lbs 07/25/2022 Weight: 131 lbs 11/04/2022 Weight: 131 lbs          Spoke with patient and heart failure questions reviewed.  Transmission results reviewed.  Pt asymptomatic for fluid accumulation.  Reports feeling well at this time and voices no complaints.     CorVue Thoracic impedance suggesting normal fluid levels with the exception of possible fluid accumulation from 10/20-10/25 and 11/18-11/21.   Prescribed: Furosemide 40 mg take 1 tablet PRN for swelling or weight gain. Potassium   20 mEq take 1 tablet PRN when taking Furosemide.   Labs: 09/23/2022 Creatinine 0.94, BUN 8, Potassium 5.1, Sodium 134, GFR >60 04/10/2022 Creatinine 1.05, BUN 9, Potassium 4.1, Sodium 131, GFR >60 03/21/2022 Creatinine 0.92, BUN 6, Potassium 5.4, Sodium 132, GFR >60 A complete set of results can be found in Results Review.   Recommendations:  No changes and encouraged to call if experiencing any fluid symptoms.   Follow-up plan: ICM clinic phone appointment on 01/12/2023.  91 day device clinic remote transmission 02/05/2023.      EP/Cardiology Office Visits:  12/16/2022 with Dr Elberta Fortis.    Copy of ICM check sent to Dr. Elberta Fortis.    3 month ICM trend: 12/08/2022.    12-14 Month ICM trend:     Karie Soda, RN 12/09/2022 3:03 PM

## 2022-12-16 ENCOUNTER — Encounter: Payer: Self-pay | Admitting: Cardiology

## 2022-12-16 ENCOUNTER — Ambulatory Visit: Payer: PPO | Attending: Cardiology | Admitting: Cardiology

## 2022-12-16 VITALS — BP 104/68 | HR 90 | Ht 68.0 in | Wt 125.4 lb

## 2022-12-16 DIAGNOSIS — I5022 Chronic systolic (congestive) heart failure: Secondary | ICD-10-CM

## 2022-12-16 DIAGNOSIS — I2510 Atherosclerotic heart disease of native coronary artery without angina pectoris: Secondary | ICD-10-CM | POA: Diagnosis not present

## 2022-12-16 DIAGNOSIS — I480 Paroxysmal atrial fibrillation: Secondary | ICD-10-CM

## 2022-12-16 DIAGNOSIS — D6869 Other thrombophilia: Secondary | ICD-10-CM

## 2022-12-16 DIAGNOSIS — I472 Ventricular tachycardia, unspecified: Secondary | ICD-10-CM | POA: Diagnosis not present

## 2022-12-16 LAB — CUP PACEART INCLINIC DEVICE CHECK
Battery Remaining Longevity: 52 mo
Brady Statistic RV Percent Paced: 0 %
Date Time Interrogation Session: 20241203163850
HighPow Impedance: 69.75 Ohm
HighPow Impedance: 69.75 Ohm
Implantable Lead Connection Status: 753985
Implantable Lead Implant Date: 20181017
Implantable Lead Location: 753860
Implantable Pulse Generator Implant Date: 20181017
Lead Channel Impedance Value: 475 Ohm
Lead Channel Impedance Value: 475 Ohm
Lead Channel Pacing Threshold Amplitude: 0.5 V
Lead Channel Pacing Threshold Amplitude: 0.5 V
Lead Channel Pacing Threshold Amplitude: 0.5 V
Lead Channel Pacing Threshold Amplitude: 0.5 V
Lead Channel Pacing Threshold Pulse Width: 0.5 ms
Lead Channel Pacing Threshold Pulse Width: 0.5 ms
Lead Channel Pacing Threshold Pulse Width: 0.5 ms
Lead Channel Pacing Threshold Pulse Width: 0.5 ms
Lead Channel Sensing Intrinsic Amplitude: 12 mV
Lead Channel Sensing Intrinsic Amplitude: 12 mV
Lead Channel Setting Pacing Amplitude: 2.5 V
Lead Channel Setting Pacing Pulse Width: 0.5 ms
Lead Channel Setting Sensing Sensitivity: 0.5 mV
Pulse Gen Serial Number: 9781512

## 2022-12-16 NOTE — Progress Notes (Signed)
  Electrophysiology Office Note:   Date:  12/16/2022  ID:  [name] [name], DOB 02-28-1950, MRN [id number]  Primary Cardiologist: None Electrophysiologist: Boubacar Lerette Jorja Loa, MD      History of Present Illness:   [name] [name] is a 72 y.o. male with h/o cardiac arrest, chronic systolic heart failure, coronary artery disease, hypertension, hyperlipidemia, tobacco abuse seen today for routine electrophysiology followup.   Since last being seen in our clinic the patient reports doing.  Has no chest pain or shortness of breath.  Able to do all of his daily activities.  [place] in [place] and had no issues with travel or exercise.  Happy with his control.  he denies chest pain, palpitations, dyspnea, PND, orthopnea, nausea, vomiting, dizziness, syncope, edema, weight gain, or early satiety.   Review of systems complete and found to be negative unless listed in HPI.      EP Information / Studies Reviewed:    EKG is not ordered today. EKG from 09/23/22 reviewed which showed sinus rhythm      ICD Interrogation-  reviewed in detail today,  See PACEART report.  Device History: Abbott Single Chamber ICD implanted 10/29/2016 for chronic systolic heart failure/VF arrest History of appropriate therapy: No History of AAD therapy: No   Risk Assessment/Calculations:    CHA2DS2-VASc Score = 4   This indicates a 4.8% annual risk of stroke. The patient's score is based upon: CHF History: 1 HTN History: 1 Diabetes History: 0 Stroke History: 0 Vascular Disease History: 1 Age Score: 1 Gender Score: 0             Physical Exam:   VS:  BP 104/68 (BP Location: Left Arm, Patient Position: Sitting, Cuff Size: Normal)   Pulse 90   Ht 5\' 8"  (1.727 m)   Wt 125 lb 6.4 oz (56.9 kg)   SpO2 98%   BMI 19.07 kg/m    Wt Readings from Last 3 Encounters:  12/16/22 125 lb 6.4 oz (56.9 kg)  09/23/22 125 lb 6.4 oz (56.9 kg)  03/21/22 131 lb 6.4 oz (59.6 kg)     GEN:  Well nourished, well developed in no acute distress NECK: No JVD; No carotid bruits CARDIAC: Regular rate and rhythm, no murmurs, rubs, gallops RESPIRATORY:  Clear to auscultation without rales, wheezing or rhonchi  ABDOMEN: Soft, non-tender, non-distended EXTREMITIES:  No edema; No deformity   ASSESSMENT AND PLAN:    Chronic systolic dysfunction s/p Abbott single chamber ICD  euvolemic today Stable on an appropriate medical regimen Normal ICD function See Pace Art report No changes today  2.  VT/VF arrest: No further arrhythmias.  Continue with current management.  3.  Coronary artery disease: Three-vessel disease then mostly nonobstructive.  No current chest pain.  Plan per primary cardiology.  4.  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  Continue carvedilol.  5.  Paroxysmal atrial fibrillation: Minimal episodes.  6.  Secondary hypercoagulable state: Continue Eliquis for atrial fibrillation  Disposition:   Follow up with EP APP in 12 months   Signed, Mikalia Fessel Jorja Loa, MD

## 2023-01-10 ENCOUNTER — Other Ambulatory Visit: Payer: Self-pay | Admitting: Internal Medicine

## 2023-01-10 DIAGNOSIS — I480 Paroxysmal atrial fibrillation: Secondary | ICD-10-CM

## 2023-01-12 ENCOUNTER — Ambulatory Visit: Payer: PPO | Attending: Cardiology

## 2023-01-12 DIAGNOSIS — I5022 Chronic systolic (congestive) heart failure: Secondary | ICD-10-CM | POA: Diagnosis not present

## 2023-01-12 DIAGNOSIS — Z95810 Presence of automatic (implantable) cardiac defibrillator: Secondary | ICD-10-CM

## 2023-02-05 ENCOUNTER — Ambulatory Visit (INDEPENDENT_AMBULATORY_CARE_PROVIDER_SITE_OTHER): Payer: PPO

## 2023-02-05 DIAGNOSIS — I472 Ventricular tachycardia, unspecified: Secondary | ICD-10-CM | POA: Diagnosis not present

## 2023-02-05 LAB — CUP PACEART REMOTE DEVICE CHECK
Battery Remaining Longevity: 52 mo
Battery Remaining Percentage: 49 %
Battery Voltage: 2.95 V
Brady Statistic RV Percent Paced: 1 %
Date Time Interrogation Session: 20250123020017
HighPow Impedance: 72 Ohm
HighPow Impedance: 72 Ohm
Implantable Lead Connection Status: 753985
Implantable Lead Implant Date: 20181017
Implantable Lead Location: 753860
Implantable Pulse Generator Implant Date: 20181017
Lead Channel Impedance Value: 490 Ohm
Lead Channel Pacing Threshold Amplitude: 0.5 V
Lead Channel Pacing Threshold Pulse Width: 0.5 ms
Lead Channel Sensing Intrinsic Amplitude: 12 mV
Lead Channel Setting Pacing Amplitude: 2.5 V
Lead Channel Setting Pacing Pulse Width: 0.5 ms
Lead Channel Setting Sensing Sensitivity: 0.5 mV
Pulse Gen Serial Number: 9781512

## 2023-02-12 ENCOUNTER — Other Ambulatory Visit: Payer: Self-pay | Admitting: Internal Medicine

## 2023-02-16 ENCOUNTER — Ambulatory Visit: Payer: PPO | Attending: Cardiology

## 2023-02-16 DIAGNOSIS — I5022 Chronic systolic (congestive) heart failure: Secondary | ICD-10-CM | POA: Diagnosis not present

## 2023-02-16 DIAGNOSIS — Z95810 Presence of automatic (implantable) cardiac defibrillator: Secondary | ICD-10-CM | POA: Diagnosis not present

## 2023-03-15 NOTE — Progress Notes (Signed)
 [facility] Note   Primary Cardiologist: Dr. Gala Romney  EP: Dr. Johney Frame  Chief complaint; Heart failure  HPI  [name] [name] is a 73 y.o. male with history of cardiac arrest, systolic HF, CAD, HTN, HLD, tobacco abuse.  Had complicated admission 07/2016 due to out of hospital cardiac arrest with CPR/defib -> intestinal perforation, rib fractures, large chest wall hematoma and T8 vertebral fracture. Underwent multiple procedures including exploratory laparotomy and repair of perforated stomach with an omental patch by General Surgery and evacuation of chest hematoma by TCTS. Cath as below with 3v disease though mostly non-obstructive, medical management pursued.  EF 35-40% by echo with LV thrombus,. Lifevest placed prior to discharge.  S/p SJ ICD   He presents today for follow up. Stopped Jardiance as it made him feel bad. He feels good. Walking his dog frequently. No CP, SOB, edema, orthopnea or PND. Compliant with meds. Still smoking. "I'm down to smoking just barely over a pack per day."  Echo 9/24 EF 45-50% RV ok Personally reviewed Echo (06/12/21)  EF 40-45% RV ok    ICD interrogation: No VT/AF volume ok Personally reviewed   Echo 02/26/18  35-40% with antero-septal AK  Echo 12/2016 40-45% with antero-septal AK  Echo 08/06/16 - LVEF 35-40%, grade 1 DD. Apparent medium-sized apical thrombus.   Left/Right heart cath 08/13/16 Ost RCA lesion (nondominant), 80 %stenosed. Ost LM lesion, 40 %stenosed. Ost Ramus to Ramus lesion, 80 %stenosed. Ost LAD to Prox LAD lesion, 70 %stenosed. Ost 1st Diag to 1st Diag lesion, 75 %stenosed. Mid LAD to Dist LAD lesion, 40 %stenosed.    Review of systems complete and found to be negative unless listed in HPI.    Past Medical History:  Diagnosis Date   AICD (automatic cardioverter/defibrillator) present    CAD (coronary artery disease)    Coronary artery disease    1980's-90's MI with perhaps angioplasty   Hypertension     Ischemic cardiomyopathy    LV (left ventricular) mural thrombus following MI [facility])    Myocardial infarction [facility]) 1999   Ventricular fibrillation (HCC) 07/2016       Current Outpatient Medications  Medication Sig Dispense Refill   acetaminophen (TYLENOL) 325 MG tablet Take 2 tablets (650 mg total) by mouth every 4 (four) hours as needed for mild pain or headache.     carvedilol (COREG) 12.5 MG tablet TAKE 1 TABLET(12.5 MG) BY MOUTH TWICE DAILY WITH A MEAL 180 tablet 3   ELIQUIS 5 MG TABS tablet TAKE 1 TABLET(5 MG) BY MOUTH TWICE DAILY 180 tablet 3   ENTRESTO 97-103 MG TAKE 1 TABLET BY MOUTH TWICE DAILY 180 tablet 3   furosemide (LASIX) 40 MG tablet Take 1 tablet (40 mg total) daily as needed for swelling and/or weight gain. 30 tablet 3   rosuvastatin (CRESTOR) 10 MG tablet TAKE 1 TABLET(10 MG) BY MOUTH EVERY EVENING 90 tablet 1   No current facility-administered medications for this encounter.   Allergies  Allergen Reactions   Spironolactone Other (See Comments)    Breast pain--swelling/inflammation    Social History   Socioeconomic History   Marital status: Married    Spouse name: Not on file   Number of children: Not on file   Years of education: Not on file   Highest education level: Not on file  Occupational History   Not on file  Tobacco Use   Smoking status: Every Day    Current packs/day: 1.00    Average packs/day: 1 pack/day  for 25.0 years (25.0 ttl pk-yrs)    Types: Cigarettes   Smokeless tobacco: Former    Types: Engineer, drilling   Vaping status: Never Used  Substance and Sexual Activity   Alcohol use: Yes    Alcohol/week: 14.0 standard drinks of alcohol    Types: 14 Glasses of wine per week    Comment: 10/29/2016 "couple glasses of red wine/day"   Drug use: No   Sexual activity: Not Currently  Other Topics Concern   Not on file  Social History Narrative   Lives in [place] with spouse   Social Drivers of Corporate investment banker Strain: Not on  file  Food Insecurity: Not on file  Transportation Needs: Not on file  Physical Activity: Not on file  Stress: Not on file  Social Connections: Not on file  Intimate Partner Violence: Not on file    Family History  Problem Relation Age of Onset   Osteoporosis Mother    Lung cancer Father    Other Brother        died in [place]   There were no vitals filed for this visit.    Wt Readings from Last 3 Encounters:  12/16/22 56.9 kg (125 lb 6.4 oz)  09/23/22 56.9 kg (125 lb 6.4 oz)  03/21/22 59.6 kg (131 lb 6.4 oz)    PHYSICAL EXAM: General:  Elderly Thin. No resp difficulty General:  Well appearing. No resp difficulty HEENT: normal Neck: supple. no JVD. Carotids 2+ bilat; no bruits. No lymphadenopathy or thryomegaly appreciated. Cor: PMI nondisplaced. Regular rate & rhythm. No rubs, gallops or murmurs. Lungs: clear Abdomen: soft, nontender, nondistended. No hepatosplenomegaly. No bruits or masses. Good bowel sounds. Extremities: no cyanosis, clubbing, rash, edema Neuro: alert & orientedx3, cranial nerves grossly intact. moves all 4 extremities w/o difficulty. Affect pleasant   ASSESSMENT & PLAN:  1. CAD with ICM s/p Cardiac Arrest VT/VF:  - Cath 08/13/16 with 3V disease (mostly non-obstructive despite ECG findings)  medical therapy. If develops angina can consider PCI to Ramus.  - s/p SJ ICD - Doing well. No s/s angina  - Unable to tolerate Jardiance - Continue statin - Off ASA with Eliquis  2. Chronic systolic HF.  - Echo 08/06/16 - LVEF 35-40%, grade 1 DD. + apical thrombus.  - Echo 12/2016 EF 40-45% No LV thrombus - Echo 2/21 EF 35-40% - Echo (06/12/21)  EF 40-45% RV ok  - Echo 9/24 EF 45-50% RV ok Personally reviewed - s/p SJ ICD. Interrogation as above  - Stable NYHA II - Volume status looks good - Continue entresto 97/103 mg BID.  - Continue carvedilol 12.5 mg BID.  - Off eplerenone due to hyperkalemia.  (did not tolerate spiro due to painful gynecomastia) - Did  not tolerate Jardiance due to falls/hypovolemia   3. Perforated stomach s/p repair. - happened in setting of CPR s/p repair.  - Doing well.   4. 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  - Continue carvedilol to 12.5 mg BID.   5. LV Thrombus:  - Resolved  6. ETOH and tobacco abuse.  - No longer drinking beer. Drinks a few glasses of wine nightly. Not wanting to cut back - still smoking -> encouraged cessation - will get screening CXR  7. PAF - In NSR  - Continue Eliquis 5 mg BID. No bleeding  8. HTN - Blood pressure elevated here but ok otherwise. Continue current regimen.   Arvilla Meres, MD 03/15/23

## 2023-03-17 ENCOUNTER — Ambulatory Visit (HOSPITAL_COMMUNITY)
Admission: RE | Admit: 2023-03-17 | Discharge: 2023-03-17 | Disposition: A | Discharge status: Home / Self Care | Payer: PPO | Source: Ambulatory Visit | Attending: Internal Medicine | Admitting: Internal Medicine

## 2023-03-17 ENCOUNTER — Encounter (HOSPITAL_COMMUNITY): Payer: Self-pay | Admitting: Internal Medicine

## 2023-03-17 VITALS — BP 110/70 | HR 64 | Wt 133.2 lb

## 2023-03-17 DIAGNOSIS — I252 Old myocardial infarction: Secondary | ICD-10-CM | POA: Diagnosis not present

## 2023-03-17 DIAGNOSIS — Z7901 Long term (current) use of anticoagulants: Secondary | ICD-10-CM | POA: Insufficient documentation

## 2023-03-17 DIAGNOSIS — E875 Hyperkalemia: Secondary | ICD-10-CM | POA: Insufficient documentation

## 2023-03-17 DIAGNOSIS — I110 Hypertensive heart disease with heart failure: Secondary | ICD-10-CM | POA: Insufficient documentation

## 2023-03-17 DIAGNOSIS — Z79899 Other long term (current) drug therapy: Secondary | ICD-10-CM | POA: Diagnosis not present

## 2023-03-17 DIAGNOSIS — F17210 Nicotine dependence, cigarettes, uncomplicated: Secondary | ICD-10-CM | POA: Diagnosis not present

## 2023-03-17 DIAGNOSIS — Z95810 Presence of automatic (implantable) cardiac defibrillator: Secondary | ICD-10-CM

## 2023-03-17 DIAGNOSIS — I480 Paroxysmal atrial fibrillation: Secondary | ICD-10-CM | POA: Insufficient documentation

## 2023-03-17 DIAGNOSIS — N62 Hypertrophy of breast: Secondary | ICD-10-CM | POA: Diagnosis not present

## 2023-03-17 DIAGNOSIS — I2510 Atherosclerotic heart disease of native coronary artery without angina pectoris: Secondary | ICD-10-CM | POA: Insufficient documentation

## 2023-03-17 DIAGNOSIS — I493 Ventricular premature depolarization: Secondary | ICD-10-CM | POA: Insufficient documentation

## 2023-03-17 DIAGNOSIS — I5022 Chronic systolic (congestive) heart failure: Secondary | ICD-10-CM | POA: Diagnosis not present

## 2023-03-17 DIAGNOSIS — Z8674 Personal history of sudden cardiac arrest: Secondary | ICD-10-CM | POA: Diagnosis present

## 2023-03-17 LAB — CBC
HCT: 38.2 % — ABNORMAL LOW (ref 39.0–52.0)
Hemoglobin: 13.5 g/dL (ref 13.0–17.0)
MCH: 37.7 pg — ABNORMAL HIGH (ref 26.0–34.0)
MCHC: 35.3 g/dL (ref 30.0–36.0)
MCV: 106.7 fL — ABNORMAL HIGH (ref 80.0–100.0)
Platelets: 191 10*3/uL (ref 150–400)
RBC: 3.58 MIL/uL — ABNORMAL LOW (ref 4.22–5.81)
RDW: 12.6 % (ref 11.5–15.5)
WBC: 5.5 10*3/uL (ref 4.0–10.5)
nRBC: 0 % (ref 0.0–0.2)

## 2023-03-17 LAB — COMPREHENSIVE METABOLIC PANEL
ALT: 27 U/L (ref 0–44)
AST: 43 U/L — ABNORMAL HIGH (ref 15–41)
Albumin: 3.6 g/dL (ref 3.5–5.0)
Alkaline Phosphatase: 31 U/L — ABNORMAL LOW (ref 38–126)
Anion gap: 10 (ref 5–15)
BUN: 7 mg/dL — ABNORMAL LOW (ref 8–23)
CO2: 26 mmol/L (ref 22–32)
Calcium: 9 mg/dL (ref 8.9–10.3)
Chloride: 97 mmol/L — ABNORMAL LOW (ref 98–111)
Creatinine, Ser: 1.14 mg/dL (ref 0.61–1.24)
GFR, Estimated: >60 mL/min (ref >60)
Glucose, Bld: 102 mg/dL — ABNORMAL HIGH (ref 70–99)
Potassium: 4.8 mmol/L (ref 3.5–5.1)
Sodium: 133 mmol/L — ABNORMAL LOW (ref 135–145)
Total Bilirubin: 0.7 mg/dL (ref 0.0–1.2)
Total Protein: 6.4 g/dL — ABNORMAL LOW (ref 6.5–8.1)

## 2023-03-17 NOTE — Patient Instructions (Signed)
 Great to see you today!!!  Labs done today, we will call you for abnormal results  Your physician recommends that you schedule a follow-up appointment in: 9 Months (Dec), *PLEASE CALL OUR OFFICE IN OCTOBER TO SCHEDULE THIS APPOINTMENT  If you have any questions or concerns before your next appointment please send [place] a message through [place] or call our office at [phone].    TO LEAVE A MESSAGE FOR THE NURSE SELECT OPTION 2, PLEASE LEAVE A MESSAGE INCLUDING: YOUR NAME DATE OF BIRTH CALL BACK NUMBER REASON FOR CALL**this is important as we prioritize the call backs  YOU WILL RECEIVE A CALL BACK THE SAME DAY AS LONG AS YOU CALL BEFORE 4:00 PM  At the [facility], you and your health needs are our priority. As part of our continuing mission to provide you with exceptional heart care, we have created designated Provider Care Teams. These Care Teams include your primary Cardiologist (physician) and Advanced Practice Providers (APPs- Physician Assistants and Nurse Practitioners) who all work together to provide you with the care you need, when you need it.   You may see any of the following providers on your designated Care Team at your next follow up: Dr Arvilla Meres Dr Marca Ancona Dr. Dorthula Nettles Dr. Clearnce Hasten Amy Filbert Schilder, NP Robbie Lis, [place] [facility] [place], [place] Brynda Peon, NP [place] Lee, NP Clarisa Kindred, NP Karle Plumber, PharmD Enos Fling, PharmD   Please be sure to bring in all your medications bottles to every appointment.    Thank you for choosing [facility]

## 2023-03-17 NOTE — Addendum Note (Signed)
 Encounter addended by: Noralee Space, RN on: 03/17/2023 10:44 AM  Actions taken: Order list changed, Diagnosis association updated, Clinical Note Signed, Charge Capture section accepted

## 2023-03-18 NOTE — Progress Notes (Signed)
 Remote ICD transmission.

## 2023-03-23 ENCOUNTER — Ambulatory Visit: Payer: PPO | Attending: Cardiology

## 2023-03-23 DIAGNOSIS — Z95810 Presence of automatic (implantable) cardiac defibrillator: Secondary | ICD-10-CM | POA: Diagnosis not present

## 2023-03-23 DIAGNOSIS — I5022 Chronic systolic (congestive) heart failure: Secondary | ICD-10-CM

## 2023-03-24 ENCOUNTER — Encounter: Payer: Self-pay | Admitting: Internal Medicine

## 2023-03-25 NOTE — Progress Notes (Signed)
 EPIC Encounter for ICM Monitoring  Patient Name: [name] [name] is a 73 y.o. male Date: 03/25/2023 Primary Care Physican: Patient, No Pcp Per Primary Cardiologist: Bensimhon Electrophysiologist: [facility] 03/21/2022 Office Weight: 131 lbs 07/25/2022 Weight: 131 lbs 11/04/2022 Weight: 131 lbs  02/18/2023 Weight: 129-130 lbs 03/25/2023 Weight: 133 lbs         Spoke with patient and heart failure questions reviewed.  Transmission results reviewed.  Pt asymptomatic for fluid accumulation.  Reports feeling well at this time and voices no complaints.     CorVue Thoracic impedance suggesting normal fluid levels with the exception of possible fluid accumulation from 2/22-3/1.   Prescribed: Furosemide 40 mg take 1 tablet PRN for swelling or weight gain. Potassium   20 mEq take 1 tablet PRN when taking Furosemide.   Labs: 03/17/2023 Creatinine 1.14, BUN 7, Potassium 4.8, Sodium 133, GFR >60 A complete set of results can be found in Results Review.   Recommendations: No changes and encouraged to call if experiencing any fluid symptoms.   Follow-up plan: ICM clinic phone appointment on 04/27/2023.  91 day device clinic remote transmission 05/07/2023.      EP/Cardiology Office Visits:  Next HF clinic 9 month appt due 10/2023 (no recall).    Copy of ICM check sent to Dr. Elberta Fortis.    3 month ICM trend: 03/23/2023.    12-14 Month ICM trend:     Karie Soda, RN 03/25/2023 7:22 AM

## 2023-04-27 ENCOUNTER — Ambulatory Visit: Attending: Cardiology

## 2023-04-27 DIAGNOSIS — Z95810 Presence of automatic (implantable) cardiac defibrillator: Secondary | ICD-10-CM

## 2023-04-27 DIAGNOSIS — I5022 Chronic systolic (congestive) heart failure: Secondary | ICD-10-CM

## 2023-04-29 ENCOUNTER — Telehealth: Payer: Self-pay

## 2023-04-29 NOTE — Telephone Encounter (Signed)
 Remote ICM transmission received.  Attempted call to patient regarding ICM remote transmission and left detailed message per DPR.  Left ICM phone number and advised to return call for any fluid symptoms or questions. Next ICM remote transmission scheduled 06/01/2023.

## 2023-04-29 NOTE — Progress Notes (Signed)
 EPIC Encounter for ICM Monitoring  Patient Name: [name] [name] is a 73 y.o. male Date: 04/29/2023 Primary Care Physican: Patient, No Pcp Per Primary Cardiologist: Bensimhon Electrophysiologist: [facility] 03/21/2022 Office Weight: 131 lbs 07/25/2022 Weight: 131 lbs 11/04/2022 Weight: 131 lbs  02/18/2023 Weight: 129-130 lbs 03/25/2023 Weight: 133 lbs         Attempted call to patient and unable to reach.  Left detailed message per DPR regarding transmission.  Transmission results reviewed.    CorVue Thoracic impedance suggesting normal fluid levels with the exception of possible fluid accumulation from 3/31-4/3.   Prescribed: Furosemide 40 mg take 1 tablet PRN for swelling or weight gain. Potassium   20 mEq take 1 tablet PRN when taking Furosemide.   Labs: 03/17/2023 Creatinine 1.14, BUN 7, Potassium 4.8, Sodium 133, GFR >60 A complete set of results can be found in Results Review.   Recommendations:   Left voice mail with ICM number and encouraged to call if experiencing any fluid symptoms.   Follow-up plan: ICM clinic phone appointment on 06/01/2023.  91 day device clinic remote transmission 05/07/2023.      EP/Cardiology Office Visits:  Next HF clinic 9 month appt due 10/2023 (no recall).   Recall 12/15/2023 with EP APP.   Copy of ICM check sent to Dr. Lawana Pray.    3 month ICM trend: 04/24/2023.     Almyra Jain, RN 04/29/2023 8:56 AM

## 2023-05-07 ENCOUNTER — Ambulatory Visit (INDEPENDENT_AMBULATORY_CARE_PROVIDER_SITE_OTHER): Payer: PPO

## 2023-05-07 DIAGNOSIS — I5022 Chronic systolic (congestive) heart failure: Secondary | ICD-10-CM | POA: Diagnosis not present

## 2023-05-07 LAB — CUP PACEART REMOTE DEVICE CHECK
Battery Remaining Longevity: 49 mo
Battery Remaining Percentage: 47 %
Battery Voltage: 2.95 V
Brady Statistic RV Percent Paced: 1 %
Date Time Interrogation Session: 20250424020017
HighPow Impedance: 69 Ohm
HighPow Impedance: 69 Ohm
Implantable Lead Connection Status: 753985
Implantable Lead Implant Date: 20181017
Implantable Lead Location: 753860
Implantable Pulse Generator Implant Date: 20181017
Lead Channel Impedance Value: 510 Ohm
Lead Channel Pacing Threshold Amplitude: 0.5 V
Lead Channel Pacing Threshold Pulse Width: 0.5 ms
Lead Channel Sensing Intrinsic Amplitude: 12 mV
Lead Channel Setting Pacing Amplitude: 2.5 V
Lead Channel Setting Pacing Pulse Width: 0.5 ms
Lead Channel Setting Sensing Sensitivity: 0.5 mV
Pulse Gen Serial Number: 9781512

## 2023-06-01 ENCOUNTER — Ambulatory Visit: Attending: Cardiology

## 2023-06-01 DIAGNOSIS — Z95810 Presence of automatic (implantable) cardiac defibrillator: Secondary | ICD-10-CM

## 2023-06-01 DIAGNOSIS — I5022 Chronic systolic (congestive) heart failure: Secondary | ICD-10-CM | POA: Diagnosis not present

## 2023-06-01 NOTE — Progress Notes (Signed)
 EPIC Encounter for ICM Monitoring  Patient Name: [name] [name] is a 73 y.o. male Date: 06/01/2023 Primary Care Physican: Patient, No Pcp Per Primary Cardiologist: Bensimhon Electrophysiologist: [facility] 03/21/2022 Office Weight: 131 lbs 07/25/2022 Weight: 131 lbs 11/04/2022 Weight: 131 lbs  02/18/2023 Weight: 129-130 lbs 03/25/2023 Weight: 133 lbs         Spoke with patient and heart failure questions reviewed.  Transmission results reviewed.  Pt reports feeling okay but thought he might have a little fluid.    CorVue Thoracic impedance suggesting possible fluid accumulation starting 5/16.   Prescribed: Furosemide  40 mg take 1 tablet PRN for swelling or weight gain. Potassium   20 mEq take 1 tablet PRN when taking Furosemide .   Labs: 03/17/2023 Creatinine 1.14, BUN 7, Potassium 4.8, Sodium 133, GFR >60 A complete set of results can be found in Results Review.   Recommendations:   Take 1 PRN Lasix  x 1 day.     Follow-up plan: ICM clinic phone appointment on 06/09/2023 to recheck fluid levels.  91 day device clinic remote transmission 08/06/2023.      EP/Cardiology Office Visits:  Next HF clinic 9 month appt due 10/2023 (no recall).   Recall 12/15/2023 with EP APP.   Copy of ICM check sent to Dr. Lawana Pray.     3 month ICM trend: 06/01/2023.    12-14 Month ICM trend:     Almyra Jain, RN 06/01/2023 12:44 PM

## 2023-06-02 ENCOUNTER — Other Ambulatory Visit: Payer: Self-pay | Admitting: Internal Medicine

## 2023-06-09 ENCOUNTER — Ambulatory Visit: Attending: Cardiology

## 2023-06-09 DIAGNOSIS — Z95810 Presence of automatic (implantable) cardiac defibrillator: Secondary | ICD-10-CM

## 2023-06-09 DIAGNOSIS — I5022 Chronic systolic (congestive) heart failure: Secondary | ICD-10-CM

## 2023-06-09 NOTE — Progress Notes (Signed)
 EPIC Encounter for ICM Monitoring  Patient Name: [name] [name] is a 73 y.o. male Date: 06/09/2023 Primary Care Physican: Patient, No Pcp Per Primary Cardiologist: Bensimhon Electrophysiologist: [facility] 03/21/2022 Office Weight: 131 lbs 07/25/2022 Weight: 131 lbs 11/04/2022 Weight: 131 lbs  02/18/2023 Weight: 129-130 lbs 03/25/2023 Weight: 133 lbs         Spoke with patient and heart failure questions reviewed.  Transmission results reviewed.  Pt reports feeling okay but thought he might have a little fluid.    CorVue Thoracic impedance suggesting possible fluid accumulation starting 5/16 but trending close to baseline 5/27   Prescribed: Furosemide  40 mg take 1 tablet PRN for swelling or weight gain. Potassium   20 mEq take 1 tablet PRN when taking Furosemide .   Labs: 03/17/2023 Creatinine 1.14, BUN 7, Potassium 4.8, Sodium 133, GFR >60 A complete set of results can be found in Results Review.   Recommendations:  Will take 1 extra lasix  this week.  Will continue to monitor for fluid and take Lasix  PRN.     Follow-up plan: ICM clinic phone appointment on 07/27/2023.  91 day device clinic remote transmission 08/06/2023.      EP/Cardiology Office Visits:  Next HF clinic 9 month appt due 10/2023 (no recall).   Recall 12/15/2023 with EP APP.   Copy of ICM check sent to Dr. Lawana Pray.     3 month ICM trend: 06/09/2023.    12-14 Month ICM trend:     Almyra Jain, RN 06/09/2023 4:23 PM

## 2023-06-16 NOTE — Progress Notes (Signed)
 Remote ICD transmission.

## 2023-07-17 ENCOUNTER — Other Ambulatory Visit: Payer: Self-pay | Admitting: Internal Medicine

## 2023-07-17 DIAGNOSIS — I2510 Atherosclerotic heart disease of native coronary artery without angina pectoris: Secondary | ICD-10-CM

## 2023-07-17 DIAGNOSIS — I255 Ischemic cardiomyopathy: Secondary | ICD-10-CM

## 2023-07-17 DIAGNOSIS — I5022 Chronic systolic (congestive) heart failure: Secondary | ICD-10-CM

## 2023-07-27 ENCOUNTER — Ambulatory Visit: Attending: Cardiology

## 2023-07-27 DIAGNOSIS — I5022 Chronic systolic (congestive) heart failure: Secondary | ICD-10-CM | POA: Diagnosis not present

## 2023-07-27 DIAGNOSIS — Z95810 Presence of automatic (implantable) cardiac defibrillator: Secondary | ICD-10-CM | POA: Diagnosis not present

## 2023-07-31 NOTE — Progress Notes (Signed)
 EPIC Encounter for ICM Monitoring  Patient Name: [name] [name] is a 73 y.o. male Date: 07/31/2023 Primary Care Physican: Patient, No Pcp Per Primary Cardiologist: [name] [name] Electrophysiologist: [name] [name] 03/21/2022 Office Weight: 131 lbs 07/25/2022 Weight: 131 lbs 11/04/2022 Weight: 131 lbs  02/18/2023 Weight: 129-130 lbs 03/25/2023 Weight: 133 lbs 07/31/2023 Weight: 130 lbs         Pt Spoke with patient and heart failure questions reviewed.  Transmission results reviewed.  Pt asymptomatic for fluid accumulation.  Reports feeling well at this time and voices no complaints.      CorVue Thoracic impedance suggesting normal fluids levels.    Prescribed: Furosemide  40 mg take 1 tablet PRN for swelling or weight gain. Potassium   20 mEq take 1 tablet PRN when taking Furosemide .   Labs: 03/17/2023 Creatinine 1.14, BUN 7, Potassium 4.8, Sodium 133, GFR >60 A complete set of results can be found in Results Review.   Recommendations:  No changes and encouraged to call if experiencing any fluid symptoms.   Follow-up plan: ICM clinic phone appointment on 08/31/2023.  91 day device clinic remote transmission 08/06/2023.      EP/Cardiology Office Visits:  Next HF clinic 9 month appt due 10/2023 (no recall).   Recall 12/15/2023 with EP APP.   Copy of ICM check sent to Dr. Inocencio.      3 month ICM trend: 07/27/2023.    12-14 Month ICM trend:     [name] GORMAN Garner, RN 07/31/2023 9:48 AM

## 2023-08-06 ENCOUNTER — Ambulatory Visit: Payer: PPO

## 2023-08-06 DIAGNOSIS — I255 Ischemic cardiomyopathy: Secondary | ICD-10-CM | POA: Diagnosis not present

## 2023-08-07 LAB — CUP PACEART REMOTE DEVICE CHECK
Battery Remaining Longevity: 47 mo
Battery Remaining Percentage: 45 %
Battery Voltage: 2.95 V
Brady Statistic RV Percent Paced: 1 %
Date Time Interrogation Session: 20250724020016
HighPow Impedance: 69 Ohm
HighPow Impedance: 69 Ohm
Implantable Lead Connection Status: 753985
Implantable Lead Implant Date: 20181017
Implantable Lead Location: 753860
Implantable Pulse Generator Implant Date: 20181017
Lead Channel Impedance Value: 440 Ohm
Lead Channel Pacing Threshold Amplitude: 0.5 V
Lead Channel Pacing Threshold Pulse Width: 0.5 ms
Lead Channel Sensing Intrinsic Amplitude: 12 mV
Lead Channel Setting Pacing Amplitude: 2.5 V
Lead Channel Setting Pacing Pulse Width: 0.5 ms
Lead Channel Setting Sensing Sensitivity: 0.5 mV
Pulse Gen Serial Number: 9781512

## 2023-08-10 ENCOUNTER — Ambulatory Visit: Payer: Self-pay | Admitting: Cardiology

## 2023-08-31 ENCOUNTER — Ambulatory Visit: Attending: Cardiology

## 2023-08-31 DIAGNOSIS — I5022 Chronic systolic (congestive) heart failure: Secondary | ICD-10-CM

## 2023-08-31 DIAGNOSIS — Z95810 Presence of automatic (implantable) cardiac defibrillator: Secondary | ICD-10-CM

## 2023-09-02 ENCOUNTER — Telehealth: Payer: Self-pay

## 2023-09-02 NOTE — Telephone Encounter (Signed)
Remote ICM transmission received.  Attempted call to patient regarding ICM remote transmission and left message for return call

## 2023-09-02 NOTE — Progress Notes (Signed)
 EPIC Encounter for ICM Monitoring  Patient Name: [name] [name] is a 73 y.o. male Date: 09/02/2023 Primary Care Physican: Patient, No Pcp Per Primary Cardiologist: Bensimhon Electrophysiologist: [facility] 03/21/2022 Office Weight: 131 lbs 07/25/2022 Weight: 131 lbs 11/04/2022 Weight: 131 lbs  02/18/2023 Weight: 129-130 lbs 03/25/2023 Weight: 133 lbs 07/31/2023 Weight: 130 lbs         Attempted call to patient and unable to reach.  Left message for return call.  Transmission results reviewed.       CorVue Thoracic impedance suggesting normal fluids levels.    Prescribed: Furosemide  40 mg take 1 tablet PRN for swelling or weight gain. Potassium   20 mEq take 1 tablet PRN when taking Furosemide .   Labs: 03/17/2023 Creatinine 1.14, BUN 7, Potassium 4.8, Sodium 133, GFR >60 A complete set of results can be found in Results Review.   Recommendations:  Unable to reach.     Follow-up plan: ICM clinic phone appointment on 10/05/2023.  91 day device clinic remote transmission 11/05/2023.      EP/Cardiology Office Visits:  Next HF clinic 9 month appt due 10/2023 (no recall).   Recall 12/15/2023 with EP APP.   Copy of ICM check sent to Dr. Inocencio.      3 month ICM trend: 08/31/2023.    12-14 Month ICM trend:     [name], RN 09/02/2023 2:23 PM

## 2023-09-29 ENCOUNTER — Other Ambulatory Visit: Payer: Self-pay | Admitting: Internal Medicine

## 2023-09-29 DIAGNOSIS — I480 Paroxysmal atrial fibrillation: Secondary | ICD-10-CM

## 2023-10-05 ENCOUNTER — Ambulatory Visit: Attending: Cardiology

## 2023-10-05 DIAGNOSIS — Z95810 Presence of automatic (implantable) cardiac defibrillator: Secondary | ICD-10-CM | POA: Diagnosis not present

## 2023-10-05 DIAGNOSIS — I5022 Chronic systolic (congestive) heart failure: Secondary | ICD-10-CM | POA: Diagnosis not present

## 2023-10-06 NOTE — Progress Notes (Signed)
 EPIC Encounter for ICM Monitoring  Patient Name: [name] [name] is a 73 y.o. male Date: 10/06/2023 Primary Care Physican: Patient, No Pcp Per Primary Cardiologist: Bensimhon Electrophysiologist: [facility] 03/21/2022 Office Weight: 131 lbs 07/25/2022 Weight: 131 lbs 11/04/2022 Weight: 131 lbs  02/18/2023 Weight: 129-130 lbs 03/25/2023 Weight: 133 lbs 07/31/2023 Weight: 130 lbs 10/06/2023 Weight: 130 lbs         Spoke with patient and heart failure questions reviewed.  Transmission results reviewed.  Pt asymptomatic for fluid accumulation.  Reports feeling well at this time and voices no complaints.        CorVue Thoracic impedance suggesting normal fluids levels with the exception of intermittent days of possible fluid accumulation from 9/6-9/16.   Prescribed: Furosemide  40 mg take 1 tablet PRN for swelling or weight gain. Potassium   20 mEq take 1 tablet PRN when taking Furosemide .   Labs: 03/17/2023 Creatinine 1.14, BUN 7, Potassium 4.8, Sodium 133, GFR >60 A complete set of results can be found in Results Review.   Recommendations:  No changes and encouraged to call if experiencing any fluid symptoms.   Follow-up plan: ICM clinic phone appointment on 11/09/2023.  91 day device clinic remote transmission 11/05/2023.      EP/Cardiology Office Visits:  Next HF clinic 9 month appt due 10/2023 (no recall).   Recall 12/15/2023 with EP APP.   Copy of ICM check sent to Dr. Inocencio.      3 month ICM trend: 10/05/2023.    12-14 Month ICM trend:     [name], RN 10/06/2023 7:35 AM

## 2023-10-15 NOTE — Progress Notes (Signed)
 Remote ICD Transmission

## 2023-11-05 ENCOUNTER — Ambulatory Visit: Payer: PPO

## 2023-11-05 DIAGNOSIS — I5022 Chronic systolic (congestive) heart failure: Secondary | ICD-10-CM | POA: Diagnosis not present

## 2023-11-05 LAB — CUP PACEART REMOTE DEVICE CHECK
Battery Remaining Longevity: 44 mo
Battery Remaining Percentage: 43 %
Battery Voltage: 2.93 V
Brady Statistic RV Percent Paced: 1 %
Date Time Interrogation Session: 20251023020017
HighPow Impedance: 70 Ohm
HighPow Impedance: 70 Ohm
Implantable Lead Connection Status: 753985
Implantable Lead Implant Date: 20181017
Implantable Lead Location: 753860
Implantable Pulse Generator Implant Date: 20181017
Lead Channel Impedance Value: 460 Ohm
Lead Channel Pacing Threshold Amplitude: 0.5 V
Lead Channel Pacing Threshold Pulse Width: 0.5 ms
Lead Channel Sensing Intrinsic Amplitude: 12 mV
Lead Channel Setting Pacing Amplitude: 2.5 V
Lead Channel Setting Pacing Pulse Width: 0.5 ms
Lead Channel Setting Sensing Sensitivity: 0.5 mV
Pulse Gen Serial Number: 9781512

## 2023-11-06 ENCOUNTER — Ambulatory Visit: Payer: Self-pay | Admitting: Cardiology

## 2023-11-06 NOTE — Progress Notes (Signed)
 Remote ICD Transmission

## 2023-11-09 ENCOUNTER — Ambulatory Visit: Attending: Cardiology

## 2023-11-09 DIAGNOSIS — I5022 Chronic systolic (congestive) heart failure: Secondary | ICD-10-CM | POA: Diagnosis not present

## 2023-11-09 DIAGNOSIS — Z95810 Presence of automatic (implantable) cardiac defibrillator: Secondary | ICD-10-CM | POA: Diagnosis not present

## 2023-11-10 ENCOUNTER — Other Ambulatory Visit: Payer: Self-pay | Admitting: Internal Medicine

## 2023-11-10 NOTE — Progress Notes (Signed)
 EPIC Encounter for ICM Monitoring  Patient Name: [name] [name] is a 73 y.o. male Date: 11/10/2023 Primary Care Physican: Patient, No Pcp Per Primary Cardiologist: Bensimhon Electrophysiologist: [facility] 03/21/2022 Office Weight: 131 lbs 07/25/2022 Weight: 131 lbs 11/04/2022 Weight: 131 lbs  02/18/2023 Weight: 129-130 lbs 03/25/2023 Weight: 133 lbs 07/31/2023 Weight: 130 lbs 10/06/2023 Weight: 130 lbs         Spoke with patient and heart failure questions reviewed.  Transmission results reviewed.  Pt asymptomatic for fluid accumulation.  Reports feeling well at this time and voices no complaints.        Since 10/05/2023 ICM Remote Transmission: CorVue Thoracic impedance suggesting normal fluids levels.   Prescribed: Furosemide  40 mg take 1 tablet PRN for swelling or weight gain. Potassium   20 mEq take 1 tablet PRN when taking Furosemide .   Labs: 03/17/2023 Creatinine 1.14, BUN 7, Potassium 4.8, Sodium 133, GFR >60 A complete set of results can be found in Results Review.   Recommendations:  No changes and encouraged to call if experiencing any fluid symptoms.   Follow-up plan: ICM clinic phone appointment on 12/14/2023.  91 day device clinic remote transmission 02/04/2024.      EP/Cardiology Office Visits:  11/20/2023 with Dr Cherrie.   Recall 12/15/2023 with EP APP.   Copy of ICM check sent to Dr. Inocencio.       Remote monitoring is medically necessary for Heart Failure Management.    Daily Thoracic Impedance ICM trend: 08/11/2023 through 11/09/2023.    12-14 Month Thoracic Impedance ICM trend:     [name], RN 11/10/2023 4:51 PM

## 2023-11-20 ENCOUNTER — Ambulatory Visit (HOSPITAL_COMMUNITY)
Admission: RE | Admit: 2023-11-20 | Discharge: 2023-11-20 | Disposition: A | Discharge status: Home / Self Care | Source: Ambulatory Visit | Attending: Internal Medicine | Admitting: Internal Medicine

## 2023-11-20 ENCOUNTER — Encounter (HOSPITAL_COMMUNITY): Payer: Self-pay | Admitting: Internal Medicine

## 2023-11-20 VITALS — BP 138/74 | HR 74 | Ht 68.0 in | Wt 130.4 lb

## 2023-11-20 DIAGNOSIS — F17210 Nicotine dependence, cigarettes, uncomplicated: Secondary | ICD-10-CM | POA: Insufficient documentation

## 2023-11-20 DIAGNOSIS — E875 Hyperkalemia: Secondary | ICD-10-CM | POA: Diagnosis not present

## 2023-11-20 DIAGNOSIS — I252 Old myocardial infarction: Secondary | ICD-10-CM | POA: Diagnosis not present

## 2023-11-20 DIAGNOSIS — I110 Hypertensive heart disease with heart failure: Secondary | ICD-10-CM | POA: Diagnosis not present

## 2023-11-20 DIAGNOSIS — N62 Hypertrophy of breast: Secondary | ICD-10-CM | POA: Insufficient documentation

## 2023-11-20 DIAGNOSIS — Z8679 Personal history of other diseases of the circulatory system: Secondary | ICD-10-CM | POA: Insufficient documentation

## 2023-11-20 DIAGNOSIS — I5032 Chronic diastolic (congestive) heart failure: Secondary | ICD-10-CM | POA: Diagnosis not present

## 2023-11-20 DIAGNOSIS — I469 Cardiac arrest, cause unspecified: Secondary | ICD-10-CM | POA: Insufficient documentation

## 2023-11-20 DIAGNOSIS — Z95810 Presence of automatic (implantable) cardiac defibrillator: Secondary | ICD-10-CM | POA: Insufficient documentation

## 2023-11-20 DIAGNOSIS — I480 Paroxysmal atrial fibrillation: Secondary | ICD-10-CM | POA: Insufficient documentation

## 2023-11-20 DIAGNOSIS — Z7901 Long term (current) use of anticoagulants: Secondary | ICD-10-CM | POA: Insufficient documentation

## 2023-11-20 DIAGNOSIS — I5020 Unspecified systolic (congestive) heart failure: Secondary | ICD-10-CM | POA: Insufficient documentation

## 2023-11-20 DIAGNOSIS — E785 Hyperlipidemia, unspecified: Secondary | ICD-10-CM | POA: Insufficient documentation

## 2023-11-20 DIAGNOSIS — I493 Ventricular premature depolarization: Secondary | ICD-10-CM | POA: Insufficient documentation

## 2023-11-20 DIAGNOSIS — S2239XA Fracture of one rib, unspecified side, initial encounter for closed fracture: Secondary | ICD-10-CM | POA: Insufficient documentation

## 2023-11-20 DIAGNOSIS — Z716 Tobacco abuse counseling: Secondary | ICD-10-CM | POA: Insufficient documentation

## 2023-11-20 DIAGNOSIS — E861 Hypovolemia: Secondary | ICD-10-CM | POA: Diagnosis not present

## 2023-11-20 DIAGNOSIS — I2510 Atherosclerotic heart disease of native coronary artery without angina pectoris: Secondary | ICD-10-CM | POA: Insufficient documentation

## 2023-11-20 DIAGNOSIS — I5022 Chronic systolic (congestive) heart failure: Secondary | ICD-10-CM | POA: Insufficient documentation

## 2023-11-20 DIAGNOSIS — Z79899 Other long term (current) drug therapy: Secondary | ICD-10-CM | POA: Insufficient documentation

## 2023-11-20 DIAGNOSIS — Z8674 Personal history of sudden cardiac arrest: Secondary | ICD-10-CM | POA: Diagnosis present

## 2023-11-20 MED ORDER — FUROSEMIDE 20 MG PO TABS: 20.0000 mg | ORAL_TABLET | ORAL | 3 refills | Status: AC

## 2023-11-20 MED ORDER — FUROSEMIDE 20 MG PO TABS: ORAL_TABLET | ORAL | 3 refills | Status: DC

## 2023-11-20 NOTE — Addendum Note (Signed)
 Encounter addended by: Marcelina Lisa HERO, RN on: 11/20/2023 2:36 PM  Actions taken: Order list changed, Diagnosis association updated, Clinical Note Signed

## 2023-11-20 NOTE — Patient Instructions (Signed)
 CHANGE Lasix  to 20 mg Monday to Friday Only.  Blood work in 2 weeks.  Your physician recommends that you schedule a follow-up appointment in: 1 year with an echocardiogram ( November 2026) ** PLEASE CALL THE OFFICE IN SEPTEMBER 2026 TO ARRANGE YOUR FOLLOW UP APPOINTMENT.**  If you have any questions or concerns before your next appointment please send us  a message through [facility] or call our office at [phone].    TO LEAVE A MESSAGE FOR THE NURSE SELECT OPTION 2, PLEASE LEAVE A MESSAGE INCLUDING: YOUR NAME DATE OF BIRTH CALL BACK NUMBER REASON FOR CALL**this is important as we prioritize the call backs  YOU WILL RECEIVE A CALL BACK THE SAME DAY AS LONG AS YOU CALL BEFORE 4:00 PM  At the [facility], you and your health needs are our priority. As part of our continuing mission to provide you with exceptional heart care, we have created designated Provider Care Teams. These Care Teams include your primary Cardiologist (physician) and Advanced Practice Providers (APPs- Physician Assistants and Nurse Practitioners) who all work together to provide you with the care you need, when you need it.   You may see any of the following providers on your designated Care Team at your next follow up: Dr Toribio Fuel Dr Ezra Shuck Dr. Morene Brownie Greig Mosses, NP Caffie Shed, [place] [facility] [place], [place] Beckey Coe, NP Jordan Lee, NP Ellouise Class, NP Tinnie Redman, PharmD Jaun Bash, PharmD   Please be sure to bring in all your medications bottles to every appointment.    Thank you for choosing [facility]

## 2023-11-20 NOTE — Progress Notes (Signed)
 Advanced [facility] Failure Clinic Note   Primary Cardiologist: Dr. Cherrie  EP: Dr. Kelsie--  Chief complaint; Heart Failure Follow UP   HPI  [name] [name] is a 73 y.o. male with history of cardiac arrest, systolic HF, CAD, HTN, HLD, tobacco abuse.  Had complicated admission 07/2016 due to out of hospital cardiac arrest with CPR/defib -> intestinal perforation, rib fractures, large chest wall hematoma and T8 vertebral fracture. Underwent multiple procedures including exploratory laparotomy and repair of perforated stomach with an omental patch by General Surgery and evacuation of chest hematoma by TCTS. Cath as below with 3v disease though mostly non-obstructive, medical management pursued.  EF 35-40% by echo with LV thrombus,. Lifevest placed prior to discharge.  Today she returns for HF follow up.Overall feeling fine. Occasionally dizzy. Denies SOB/PND/Orthopnea. Able to walk 30 minutes at a time. No chest pain.  Appetite ok. No fever or chills.  Taking all medications. He has not taken lasix  in a couple of months.   Echo 9/24 EF 45-50% RV ok Personally reviewed Echo (06/12/21)  EF 40-45% RV ok Echo 02/26/18  35-40% with antero-septal AK  Echo 12/2016 40-45% with antero-septal AK  Echo 08/06/16 - LVEF 35-40%, grade 1 DD. Apparent medium-sized apical thrombus.   Left/Right heart cath 08/13/16 Ost RCA lesion (nondominant), 80 %stenosed. Ost LM lesion, 40 %stenosed. Ost Ramus to Ramus lesion, 80 %stenosed. Ost LAD to Prox LAD lesion, 70 %stenosed. Ost 1st Diag to 1st Diag lesion, 75 %stenosed. Mid LAD to Dist LAD lesion, 40 %stenosed.    Review of systems complete and found to be negative unless listed in HPI.    Past Medical History:  Diagnosis Date   AICD (automatic cardioverter/defibrillator) present    CAD (coronary artery disease)    Coronary artery disease    1980's-90's MI with perhaps angioplasty   Hypertension    Ischemic cardiomyopathy    LV (left ventricular) mural  thrombus following MI [facility])    Myocardial infarction [facility]) 1999   Ventricular fibrillation (HCC) 07/2016       Current Outpatient Medications  Medication Sig Dispense Refill   acetaminophen  (TYLENOL ) 325 MG tablet Take 2 tablets (650 mg total) by mouth every 4 (four) hours as needed for mild pain or headache.     carvedilol  (COREG ) 12.5 MG tablet TAKE 1 TABLET(12.5 MG) BY MOUTH TWICE DAILY WITH A MEAL 180 tablet 3   ELIQUIS  5 MG TABS tablet TAKE 1 TABLET(5 MG) BY MOUTH TWICE DAILY 180 tablet 3   ENTRESTO  97-103 MG TAKE 1 TABLET BY MOUTH TWICE DAILY 180 tablet 3   furosemide  (LASIX ) 40 MG tablet Take 1 tablet (40 mg total) daily as needed for swelling and/or weight gain. 30 tablet 3   rosuvastatin  (CRESTOR ) 10 MG tablet TAKE 1 TABLET(10 MG) BY MOUTH EVERY EVENING 90 tablet 3   No current facility-administered medications for this encounter.   Allergies  Allergen Reactions   Spironolactone  Other (See Comments)    Breast pain--swelling/inflammation    Social History   Socioeconomic History   Marital status: Married    Spouse name: Not on file   Number of children: Not on file   Years of education: Not on file   Highest education level: Not on file  Occupational History   Not on file  Tobacco Use   Smoking status: Every Day    Current packs/day: 1.00    Average packs/day: 1 pack/day for 25.0 years (25.0 ttl pk-yrs)    Types: Cigarettes  Smokeless tobacco: Former    Types: Engineer, Drilling   Vaping status: Never Used  Substance and Sexual Activity   Alcohol use: Yes    Alcohol/week: 14.0 standard drinks of alcohol    Types: 14 Glasses of wine per week    Comment: 10/29/2016 couple glasses of red wine/day   Drug use: No   Sexual activity: Not Currently  Other Topics Concern   Not on file  Social History Narrative   Lives in [place] with spouse   Social Drivers of Corporate Investment Banker Strain: Not on file  Food Insecurity: Not on file  Transportation  Needs: Not on file  Physical Activity: Not on file  Stress: Not on file  Social Connections: Not on file  Intimate Partner Violence: Not on file    Family History  Problem Relation Age of Onset   Osteoporosis Mother    Lung cancer Father    Other Brother        died in [place]   Vitals:   2023/11/30 1348  BP: 138/74  Pulse: 74  SpO2: 99%  Weight: 59.1 kg (130 lb 6.4 oz)  Height: 5' 8 (1.727 m)       Wt Readings from Last 3 Encounters:  11/30/2023 59.1 kg (130 lb 6.4 oz)  03/17/23 60.4 kg (133 lb 3.2 oz)  12/16/22 56.9 kg (125 lb 6.4 oz)    PHYSICAL EXAM: General:   No resp difficulty Neck: no JVD.  Cor: Regular rate & rhythm. Lungs: clear Abdomen: soft, nontender, nondistended.  Extremities: no  edema Neuro: alert & oriented x3  ASSESSMENT & PLAN:  1. CAD with ICM s/p Cardiac Arrest VT/VF:  - Cath 08/13/16 with 3V disease (mostly non-obstructive despite ECG findings)  medical therapy. If develops angina can consider PCI to Ramus.  - s/p SJ ICD - No VR - Unable to tolerate Jardiance  - Continue statin - Off ASA with Eliquis   2. Chronic systolic HF.  - Echo 08/06/16 - LVEF 35-40%, grade 1 DD. + apical thrombus.  - Echo 12/2016 EF 40-45% No LV thrombus - Echo 2/21 EF 35-40% - Echo (06/12/21)  EF 40-45% RV ok  - Echo 9/24 EF 45-50% RV ok Personally reviewed - s/p SJ ICD.interrogation as above - NYHA I. Impedance down for the last 16 days. Instructed to take lasix  20 mg today then 40 mg lasix  tomorrow the back to as needed.  - Continue entresto  97/103 mg BID.  - Continue carvedilol  12.5 mg BID.  - Off eplerenone  due to hyperkalemia.  (did not tolerate spiro due to painful gynecomastia) - Did not tolerate Jardiance  due to falls/hypovolemia - CHeck BMET   3. Perforated stomach s/p repair. - happened in setting of CPR s/p repair.  - Resolved  4. 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  - Continue carvedilol  to 12.5 mg BID.  - Resolved  5. LV Thrombus:  -Resolved  6.  ETOH and tobacco abuse.  - Drinks a glass or 2 of wine per night.  -Discussed smoking cessation.   7. PAF -In NSR. Continue Eliquis   8. HTN - Blood pressure well controlled. Continue current regimen.   Follow up in 1 year with an ECHO   Greig Mosses, NP  Patient seen and examined with the above-signed Advanced Practice Provider and/or Housestaff. I personally reviewed laboratory data, imaging studies and relevant notes. I independently examined the patient and formulated the important aspects of the plan. I have edited the note to reflect any  of my changes or salient points. I have personally discussed the plan with the patient and/or family.  Doing well. NYHA I. Volume up. Still smoking < 10 cigs/day.   General:  Sitting up in bed. No resp difficulty HEENT: normal Neck: supple. JVP 7 Cor: Regular rate & rhythm. No rubs, gallops or murmurs. Lungs: clear Abdomen: soft, nontender, nondistended.Good bowel sounds. Extremities: no cyanosis, clubbing, rash, edema Neuro: alert & orientedx3, cranial nerves grossly intact. moves all 4 extremities w/o difficulty. Affect pleasant  Doing well. Volume up. Will give lasix  x 2 days then lasix  20 mg M/F follow closely with ICM Due for repeat echo. BMET 2 weeks. Discussed smoking cessation.   Toribio Fuel, MD  2:29 PM     .

## 2023-11-20 NOTE — Addendum Note (Signed)
 Encounter addended by: Marcelina Lisa HERO, RN on: 11/20/2023 2:39 PM  Actions taken: Order list changed

## 2023-11-25 ENCOUNTER — Telehealth: Payer: Self-pay

## 2023-11-25 ENCOUNTER — Ambulatory Visit: Attending: Cardiology

## 2023-11-25 DIAGNOSIS — I5032 Chronic diastolic (congestive) heart failure: Secondary | ICD-10-CM

## 2023-11-25 DIAGNOSIS — Z95810 Presence of automatic (implantable) cardiac defibrillator: Secondary | ICD-10-CM

## 2023-11-25 NOTE — Progress Notes (Signed)
 EPIC Encounter for ICM Monitoring  Patient Name: [name] [name] is a 73 y.o. male Date: 11/25/2023 Primary Care Physican: Patient, No Pcp Per Primary Cardiologist: Bensimhon Electrophysiologist: [facility] 03/21/2022 Office Weight: 131 lbs 07/25/2022 Weight: 131 lbs 11/04/2022 Weight: 131 lbs  02/18/2023 Weight: 129-130 lbs 03/25/2023 Weight: 133 lbs 07/31/2023 Weight: 130 lbs 10/06/2023 Weight: 130 lbs         Spoke with patient and heart failure questions reviewed.  Transmission results reviewed.  Pt asymptomatic for fluid accumulation.  Explained HF clinic wanted to recheck fluid levels today and he sent remote transmission.  He is not having any trouble taking the Lasix  as prescribed at 11/20/2023 office visit.    Since 11/09/2023 ICM Remote Transmission: CorVue Thoracic impedance suggesting possible dryness in response to taking 20 mg Lasix  followed by a 40 mg dose as instructed at 11/20/2023 HF clinic office visit.  Trending back to baseline.   Was suggesting possible fluid accumulation 11/07/2023-11/09/2023 and 11/12/2023-11/21/2023.     Prescribed: Furosemide  20 mg take 1 tablet twice a week Mondays and Fridays (started 11/23/2023).   Labs: 03/17/2023 Creatinine 1.14, BUN 7, Potassium 4.8, Sodium 133, GFR >60 A complete set of results can be found in Results Review.   Recommendations: Confirmed is taking Lasix  20 mg twice a week on Mondays and Fridays.   No changes and encouraged to call if experiencing any fluid symptoms.   Copy sent to Greig Mosses, NP as requested following 11/20/2023 office visit.    Follow-up plan: ICM clinic phone appointment on 12/14/2023.  91 day device clinic remote transmission 02/04/2024.      EP/Cardiology Office Visits:  11/20/2023 with Dr Cherrie.   Recall 12/15/2023 with EP APP.   Copy of ICM check sent to Dr. Inocencio.      Remote monitoring is medically necessary for Heart Failure Management.    Daily Thoracic Impedance ICM trend: 08/27/2023 through  11/25/2023.    12-14 Month Thoracic Impedance ICM trend:     [name], RN 11/25/2023 10:59 AM

## 2023-11-25 NOTE — Telephone Encounter (Signed)
 Spoke with patient.  Assisted sending a remote transmission for fluid level check as requested by HF clinic.

## 2023-12-04 ENCOUNTER — Ambulatory Visit (HOSPITAL_COMMUNITY)
Admission: RE | Admit: 2023-12-04 | Discharge: 2023-12-04 | Disposition: A | Discharge status: Home / Self Care | Source: Ambulatory Visit | Attending: Cardiology | Admitting: Cardiology

## 2023-12-04 DIAGNOSIS — I5032 Chronic diastolic (congestive) heart failure: Secondary | ICD-10-CM | POA: Diagnosis present

## 2023-12-04 LAB — BASIC METABOLIC PANEL WITH GFR
Anion gap: 7 (ref 5–15)
BUN: 11 mg/dL (ref 8–23)
CO2: 26 mmol/L (ref 22–32)
Calcium: 8.8 mg/dL — ABNORMAL LOW (ref 8.9–10.3)
Chloride: 97 mmol/L — ABNORMAL LOW (ref 98–111)
Creatinine, Ser: 1.16 mg/dL (ref 0.61–1.24)
GFR, Estimated: >60 mL/min (ref >60)
Glucose, Bld: 99 mg/dL (ref 70–99)
Potassium: 4.9 mmol/L (ref 3.5–5.1)
Sodium: 130 mmol/L — ABNORMAL LOW (ref 135–145)

## 2023-12-14 ENCOUNTER — Telehealth: Payer: Self-pay

## 2023-12-14 ENCOUNTER — Ambulatory Visit: Attending: Cardiology

## 2023-12-14 DIAGNOSIS — I5032 Chronic diastolic (congestive) heart failure: Secondary | ICD-10-CM

## 2023-12-14 DIAGNOSIS — Z95810 Presence of automatic (implantable) cardiac defibrillator: Secondary | ICD-10-CM | POA: Diagnosis not present

## 2023-12-14 NOTE — Telephone Encounter (Signed)
 Remote ICM transmission received.  Attempted call to patient regarding ICM remote transmission.  Left detailed message per DPR with ICM phone number to return call for any questions, concerns or fluid symptoms.

## 2023-12-14 NOTE — Progress Notes (Signed)
 EPIC Encounter for ICM Monitoring  Patient Name: [name] [name] is a 73 y.o. male Date: 12/14/2023 Primary Care Physican: Patient, No Pcp Per Primary Cardiologist: Bensimhon Electrophysiologist: [facility] 03/21/2022 Office Weight: 131 lbs 07/25/2022 Weight: 131 lbs 11/04/2022 Weight: 131 lbs  02/18/2023 Weight: 129-130 lbs 03/25/2023 Weight: 133 lbs 07/31/2023 Weight: 130 lbs 10/06/2023 Weight: 130 lbs         Attempted call to patient and unable to reach.  Left detailed message per DPR regarding transmission.  Transmission results reviewed.    Since 11/25/2023 ICM Remote Transmission: [facility] Thoracic impedance suggesting possible fluid accumulation 12/03/2023-12/12/2023.    Prescribed: Furosemide  20 mg take 1 tablet twice a week Mondays and Fridays (started 11/23/2023).   Labs: 03/17/2023 Creatinine 1.14, BUN 7, Potassium 4.8, Sodium 133, GFR >60 A complete set of results can be found in Results Review.   Recommendations:  Left voice mail with ICM number and encouraged to call if experiencing any fluid symptoms.   Follow-up plan: ICM clinic phone appointment on 01/18/2024.  91 day device clinic remote transmission 02/04/2024.      EP/Cardiology Office Visits:   Recall 11/22/2024 with Dr Cherrie.   Recall 12/15/2023 with EP APP.   Copy of ICM check sent to Dr. Inocencio.       Remote monitoring is medically necessary for Heart Failure Management.    Daily Thoracic Impedance ICM trend: 09/15/2023 through 12/14/2023.    12-14 Month Thoracic Impedance ICM trend:     [name] GORMAN Garner, RN 12/14/2023 3:28 PM

## 2024-01-18 ENCOUNTER — Ambulatory Visit

## 2024-01-18 DIAGNOSIS — Z95810 Presence of automatic (implantable) cardiac defibrillator: Secondary | ICD-10-CM | POA: Diagnosis not present

## 2024-01-18 DIAGNOSIS — I5032 Chronic diastolic (congestive) heart failure: Secondary | ICD-10-CM | POA: Diagnosis not present

## 2024-01-20 NOTE — Progress Notes (Signed)
 EPIC Encounter for ICM Monitoring  Patient Name: [name] [name] is a 74 y.o. male Date: 01/20/2024 Primary Care Physican: Patient, No Pcp Per Primary Cardiologist: Bensimhon Electrophysiologist: [facility] 03/21/2022 Office Weight: 131 lbs 07/25/2022 Weight: 131 lbs 11/04/2022 Weight: 131 lbs  02/18/2023 Weight: 129-130 lbs 03/25/2023 Weight: 133 lbs 07/31/2023 Weight: 130 lbs 10/06/2023 Weight: 130 lbs 11/20/2023 Office Weight: 130 lbs         Spoke with patient and heart failure questions reviewed.  Transmission results reviewed.  Pt asymptomatic for fluid accumulation.  Reports feeling well at this time and voices no complaints.     Since 12/14/2023 ICM Remote Transmission: [employer] Thoracic impedance suggesting normal fluid levels with the exception of possible fluid accumulation from 12/20/2023-12/26/2023.    Prescribed: Furosemide  20 mg take 1 tablet twice a week Mondays and Fridays (started 11/23/2023).   Labs: 03/17/2023 Creatinine 1.14, BUN 7, Potassium 4.8, Sodium 133, GFR >60 A complete set of results can be found in Results Review.   Recommendations:  No changes and encouraged to call if experiencing any fluid symptoms.   Follow-up plan: ICM clinic phone appointment on 02/18/2024.  91 day device clinic remote transmission 02/04/2024.      EP/Cardiology Office Visits:   Recall 11/22/2024 with Dr Cherrie.   Recall 12/15/2023 with EP APP.   Copy of ICM check sent to Dr. Inocencio.         Remote monitoring is medically necessary for Heart Failure Management.    Daily Thoracic Impedance ICM trend: 10/20/2023 through 01/18/2024.    12-14 Month Thoracic Impedance ICM trend:     [name], RN 01/20/2024 12:11 PM

## 2024-02-04 ENCOUNTER — Ambulatory Visit

## 2024-02-04 ENCOUNTER — Ambulatory Visit: Payer: Self-pay | Admitting: Cardiology

## 2024-02-04 DIAGNOSIS — I5032 Chronic diastolic (congestive) heart failure: Secondary | ICD-10-CM | POA: Diagnosis not present

## 2024-02-04 LAB — CUP PACEART REMOTE DEVICE CHECK
Battery Remaining Longevity: 43 mo
Battery Remaining Percentage: 42 %
Battery Voltage: 2.93 V
Brady Statistic RV Percent Paced: 1 %
Date Time Interrogation Session: 20260122020015
HighPow Impedance: 72 Ohm
HighPow Impedance: 72 Ohm
Implantable Lead Connection Status: 753985
Implantable Lead Implant Date: 20181017
Implantable Lead Location: 753860
Implantable Pulse Generator Implant Date: 20181017
Lead Channel Impedance Value: 480 Ohm
Lead Channel Pacing Threshold Amplitude: 0.5 V
Lead Channel Pacing Threshold Pulse Width: 0.5 ms
Lead Channel Sensing Intrinsic Amplitude: 12 mV
Lead Channel Setting Pacing Amplitude: 2.5 V
Lead Channel Setting Pacing Pulse Width: 0.5 ms
Lead Channel Setting Sensing Sensitivity: 0.5 mV
Pulse Gen Serial Number: 9781512

## 2024-02-06 NOTE — Progress Notes (Signed)
 Remote ICD Transmission

## 2024-02-11 NOTE — Progress Notes (Signed)
 31 day ICM Remote transmission canceled due to [facility] clinic is on hold until further notice.  91 day remote monitoring will continue per protocol.

## 2024-02-18 ENCOUNTER — Ambulatory Visit

## 2024-05-05 ENCOUNTER — Ambulatory Visit

## 2024-08-04 ENCOUNTER — Ambulatory Visit

## 2024-11-03 ENCOUNTER — Ambulatory Visit

## 2025-02-02 ENCOUNTER — Ambulatory Visit

## 2025-05-04 ENCOUNTER — Ambulatory Visit
